# Patient Record
Sex: Male | Born: 1935 | Race: White | Hispanic: No | Smoking: Former smoker
Health system: Southern US, Community
[De-identification: ages and names within clinical notes are randomized; demographics above are authoritative.]

## PROBLEM LIST (undated history)

## (undated) DIAGNOSIS — R413 Other amnesia: Secondary | ICD-10-CM

## (undated) DIAGNOSIS — I219 Acute myocardial infarction, unspecified: Secondary | ICD-10-CM

## (undated) DIAGNOSIS — E785 Hyperlipidemia, unspecified: Secondary | ICD-10-CM

## (undated) DIAGNOSIS — I6350 Cerebral infarction due to unspecified occlusion or stenosis of unspecified cerebral artery: Secondary | ICD-10-CM

## (undated) DIAGNOSIS — I480 Paroxysmal atrial fibrillation: Secondary | ICD-10-CM

## (undated) DIAGNOSIS — M1990 Unspecified osteoarthritis, unspecified site: Secondary | ICD-10-CM

## (undated) DIAGNOSIS — N400 Enlarged prostate without lower urinary tract symptoms: Secondary | ICD-10-CM

## (undated) DIAGNOSIS — I2510 Atherosclerotic heart disease of native coronary artery without angina pectoris: Secondary | ICD-10-CM

## (undated) DIAGNOSIS — R32 Unspecified urinary incontinence: Secondary | ICD-10-CM

## (undated) DIAGNOSIS — M109 Gout, unspecified: Secondary | ICD-10-CM

## (undated) DIAGNOSIS — I119 Hypertensive heart disease without heart failure: Secondary | ICD-10-CM

## (undated) DIAGNOSIS — I739 Peripheral vascular disease, unspecified: Secondary | ICD-10-CM

## (undated) DIAGNOSIS — N183 Chronic kidney disease, stage 3 unspecified: Secondary | ICD-10-CM

## (undated) DIAGNOSIS — K089 Disorder of teeth and supporting structures, unspecified: Secondary | ICD-10-CM

## (undated) DIAGNOSIS — Z8673 Personal history of transient ischemic attack (TIA), and cerebral infarction without residual deficits: Secondary | ICD-10-CM

## (undated) DIAGNOSIS — K219 Gastro-esophageal reflux disease without esophagitis: Secondary | ICD-10-CM

## (undated) DIAGNOSIS — C801 Malignant (primary) neoplasm, unspecified: Secondary | ICD-10-CM

## (undated) HISTORY — DX: Unspecified urinary incontinence: R32

## (undated) HISTORY — PX: OTHER SURGICAL HISTORY: SHX169

## (undated) HISTORY — DX: Other amnesia: R41.3

## (undated) HISTORY — PX: PROSTATE SURGERY: SHX751

---

## 1992-11-24 DIAGNOSIS — I219 Acute myocardial infarction, unspecified: Secondary | ICD-10-CM

## 1992-11-24 HISTORY — DX: Acute myocardial infarction, unspecified: I21.9

## 2001-02-04 ENCOUNTER — Ambulatory Visit (HOSPITAL_COMMUNITY): Admission: RE | Admit: 2001-02-04 | Discharge: 2001-02-04 | Payer: Self-pay | Admitting: Cardiology

## 2002-12-23 ENCOUNTER — Encounter: Payer: Self-pay | Admitting: Surgery

## 2002-12-26 ENCOUNTER — Ambulatory Visit (HOSPITAL_COMMUNITY): Admission: RE | Admit: 2002-12-26 | Discharge: 2002-12-27 | Payer: Self-pay | Admitting: Surgery

## 2002-12-26 HISTORY — PX: HERNIA REPAIR: SHX51

## 2005-07-21 ENCOUNTER — Ambulatory Visit (HOSPITAL_COMMUNITY): Admission: RE | Admit: 2005-07-21 | Discharge: 2005-07-21 | Payer: Self-pay | Admitting: Cardiology

## 2005-07-21 HISTORY — PX: CARDIAC CATHETERIZATION: SHX172

## 2011-08-08 ENCOUNTER — Inpatient Hospital Stay (HOSPITAL_COMMUNITY): Payer: Medicare Other

## 2011-08-08 ENCOUNTER — Emergency Department (HOSPITAL_COMMUNITY): Payer: Medicare Other

## 2011-08-08 ENCOUNTER — Inpatient Hospital Stay (HOSPITAL_COMMUNITY)
Admission: EM | Admit: 2011-08-08 | Discharge: 2011-08-11 | DRG: 309 | Disposition: A | Discharge status: Home / Self Care | Payer: Medicare Other | Attending: Cardiology | Admitting: Cardiology

## 2011-08-08 DIAGNOSIS — Z7901 Long term (current) use of anticoagulants: Secondary | ICD-10-CM

## 2011-08-08 DIAGNOSIS — N189 Chronic kidney disease, unspecified: Secondary | ICD-10-CM | POA: Diagnosis present

## 2011-08-08 DIAGNOSIS — E785 Hyperlipidemia, unspecified: Secondary | ICD-10-CM | POA: Diagnosis present

## 2011-08-08 DIAGNOSIS — D696 Thrombocytopenia, unspecified: Secondary | ICD-10-CM | POA: Diagnosis present

## 2011-08-08 DIAGNOSIS — I200 Unstable angina: Secondary | ICD-10-CM | POA: Diagnosis present

## 2011-08-08 DIAGNOSIS — I4891 Unspecified atrial fibrillation: Principal | ICD-10-CM | POA: Diagnosis present

## 2011-08-08 DIAGNOSIS — I739 Peripheral vascular disease, unspecified: Secondary | ICD-10-CM | POA: Diagnosis present

## 2011-08-08 DIAGNOSIS — N179 Acute kidney failure, unspecified: Secondary | ICD-10-CM | POA: Diagnosis present

## 2011-08-08 DIAGNOSIS — I129 Hypertensive chronic kidney disease with stage 1 through stage 4 chronic kidney disease, or unspecified chronic kidney disease: Secondary | ICD-10-CM | POA: Diagnosis present

## 2011-08-08 DIAGNOSIS — Z8673 Personal history of transient ischemic attack (TIA), and cerebral infarction without residual deficits: Secondary | ICD-10-CM

## 2011-08-08 DIAGNOSIS — I2510 Atherosclerotic heart disease of native coronary artery without angina pectoris: Secondary | ICD-10-CM | POA: Diagnosis present

## 2011-08-08 LAB — POCT I-STAT TROPONIN I: Troponin i, poc: 0 ng/mL (ref 0.00–0.08)

## 2011-08-08 LAB — CBC
HCT: 40.4 % (ref 39.0–52.0)
Hemoglobin: 14.1 g/dL (ref 13.0–17.0)
RBC: 4.27 MIL/uL (ref 4.22–5.81)
WBC: 8.3 10*3/uL (ref 4.0–10.5)

## 2011-08-08 LAB — COMPREHENSIVE METABOLIC PANEL
ALT: 7 U/L (ref 0–53)
Albumin: 3.4 g/dL — ABNORMAL LOW (ref 3.5–5.2)
Alkaline Phosphatase: 63 U/L (ref 39–117)
Calcium: 10 mg/dL (ref 8.4–10.5)
Potassium: 4.4 mEq/L (ref 3.5–5.1)
Sodium: 145 mEq/L (ref 135–145)
Total Protein: 6.8 g/dL (ref 6.0–8.3)

## 2011-08-08 LAB — DIFFERENTIAL
Basophils Absolute: 0 10*3/uL (ref 0.0–0.1)
Lymphocytes Relative: 16 % (ref 12–46)
Monocytes Absolute: 0.6 10*3/uL (ref 0.1–1.0)
Neutro Abs: 6.1 10*3/uL (ref 1.7–7.7)
Neutrophils Relative %: 73 % (ref 43–77)

## 2011-08-08 LAB — CARDIAC PANEL(CRET KIN+CKTOT+MB+TROPI)
Relative Index: INVALID (ref 0.0–2.5)
Total CK: 76 U/L (ref 7–232)
Troponin I: <0.3 ng/mL (ref <0.30)

## 2011-08-08 LAB — HEPARIN LEVEL (UNFRACTIONATED): Heparin Unfractionated: 0.21 IU/mL — ABNORMAL LOW (ref 0.30–0.70)

## 2011-08-08 LAB — PROTIME-INR
INR: 1.05 (ref 0.00–1.49)
INR: 1.08 (ref 0.00–1.49)
Prothrombin Time: 13.9 seconds (ref 11.6–15.2)

## 2011-08-08 LAB — CK TOTAL AND CKMB (NOT AT ARMC)
CK, MB: 3.8 ng/mL (ref 0.3–4.0)
Relative Index: 3.6 — ABNORMAL HIGH (ref 0.0–2.5)

## 2011-08-08 LAB — APTT: aPTT: 33 seconds (ref 24–37)

## 2011-08-08 LAB — TROPONIN I: Troponin I: <0.3 ng/mL (ref <0.30)

## 2011-08-09 LAB — URINALYSIS, ROUTINE W REFLEX MICROSCOPIC
Bilirubin Urine: NEGATIVE
Glucose, UA: NEGATIVE mg/dL
Specific Gravity, Urine: 1.012 (ref 1.005–1.030)
Urobilinogen, UA: 0.2 mg/dL (ref 0.0–1.0)
pH: 6 (ref 5.0–8.0)

## 2011-08-09 LAB — CARDIAC PANEL(CRET KIN+CKTOT+MB+TROPI)
CK, MB: 3.1 ng/mL (ref 0.3–4.0)
Total CK: 74 U/L (ref 7–232)

## 2011-08-09 LAB — PROTIME-INR
INR: 1.11 (ref 0.00–1.49)
Prothrombin Time: 14.5 seconds (ref 11.6–15.2)

## 2011-08-09 LAB — CBC
HCT: 36 % — ABNORMAL LOW (ref 39.0–52.0)
MCH: 33 pg (ref 26.0–34.0)
MCHC: 35.3 g/dL (ref 30.0–36.0)
RDW: 13.1 % (ref 11.5–15.5)

## 2011-08-09 LAB — BASIC METABOLIC PANEL
BUN: 49 mg/dL — ABNORMAL HIGH (ref 6–23)
CO2: 19 mEq/L (ref 19–32)
GFR calc non Af Amer: 21 mL/min — ABNORMAL LOW (ref >60)
Glucose, Bld: 94 mg/dL (ref 70–99)
Potassium: 4 mEq/L (ref 3.5–5.1)
Sodium: 137 mEq/L (ref 135–145)

## 2011-08-09 LAB — URINE MICROSCOPIC-ADD ON

## 2011-08-09 LAB — HEPARIN LEVEL (UNFRACTIONATED): Heparin Unfractionated: 0.39 IU/mL (ref 0.30–0.70)

## 2011-08-10 LAB — BASIC METABOLIC PANEL
BUN: 42 mg/dL — ABNORMAL HIGH (ref 6–23)
Chloride: 106 mEq/L (ref 96–112)
GFR calc Af Amer: 29 mL/min — ABNORMAL LOW (ref >60)
GFR calc non Af Amer: 24 mL/min — ABNORMAL LOW (ref >60)
Potassium: 4.1 mEq/L (ref 3.5–5.1)
Sodium: 138 mEq/L (ref 135–145)

## 2011-08-10 LAB — HEPARIN LEVEL (UNFRACTIONATED): Heparin Unfractionated: 0.49 IU/mL (ref 0.30–0.70)

## 2011-08-10 LAB — CBC
HCT: 36.7 % — ABNORMAL LOW (ref 39.0–52.0)
MCHC: 35.4 g/dL (ref 30.0–36.0)
Platelets: 101 10*3/uL — ABNORMAL LOW (ref 150–400)
RDW: 13.1 % (ref 11.5–15.5)
WBC: 6.3 10*3/uL (ref 4.0–10.5)

## 2011-08-10 LAB — PROTIME-INR: INR: 1.72 — ABNORMAL HIGH (ref 0.00–1.49)

## 2011-08-10 LAB — TSH: TSH: 3.192 u[IU]/mL (ref 0.350–4.500)

## 2011-08-11 LAB — BASIC METABOLIC PANEL
CO2: 23 mEq/L (ref 19–32)
Chloride: 107 mEq/L (ref 96–112)
GFR calc Af Amer: 33 mL/min — ABNORMAL LOW (ref >60)
Potassium: 4.2 mEq/L (ref 3.5–5.1)
Sodium: 138 mEq/L (ref 135–145)

## 2011-08-11 LAB — PROTIME-INR
INR: 2.47 — ABNORMAL HIGH (ref 0.00–1.49)
Prothrombin Time: 27.2 seconds — ABNORMAL HIGH (ref 11.6–15.2)

## 2011-08-11 LAB — CBC
Hemoglobin: 12.3 g/dL — ABNORMAL LOW (ref 13.0–17.0)
MCH: 32 pg (ref 26.0–34.0)
MCHC: 34.2 g/dL (ref 30.0–36.0)
RDW: 13.1 % (ref 11.5–15.5)

## 2011-08-11 LAB — HEPARIN LEVEL (UNFRACTIONATED): Heparin Unfractionated: 0.53 IU/mL (ref 0.30–0.70)

## 2011-08-27 NOTE — Discharge Summary (Signed)
Stanley Robles, Stanley Robles                ACCOUNT NO.:  [id number]  MEDICAL RECORD NO.:  [id number]  LOCATION:  3731                         FACILITY:  [facility]  PHYSICIAN:  Georga Hacking, M.D.DATE OF BIRTH:  24-Dec-1931  DATE OF ADMISSION:  08/08/2011 DATE OF DISCHARGE:  08/11/2011                              DISCHARGE SUMMARY   FINAL DIAGNOSES: 1. Atrial fibrillation with rapid response, resolved. 2. Chest pain consistent with unstable angina pectoris due to atrial     fibrillation. 3. Acute on chronic kidney disease. 4. Previous history of stroke without residual. 5. Hypertension. 6. Hyperlipidemia under treatment. 7. Peripheral vascular disease.  PROCEDURES:  Echocardiogram.  HISTORY:  A 77 year old male who has a prior history of coronary artery disease with an occluded right coronary artery and occluded posterolateral branch of the circumflex.  He has stable angina beginning along well but did have a previous history of stroke several years ago. He was in his usual state of health and had treatment for wasp sting several weeks ago with unknown medicine but possibly involving prednisone.  He pushed the lawn mower up the hill the other day and was feeling fine yesterday and noticed some mild dyspnea with exertion.  He awoke the morning of admission with over 1 hour of substernal chest pressure and did not feel well.  He already had an appointment scheduled in the office and when he arrived was not having chest discomfort but did not feel well and was found to be in new onset of atrial fibrillation.  Please see the previously dictated history and physical for remainder of the details.  HOSPITAL COURSE:  Laboratory data shows normal CBC on admission with a platelet count of 111,000 prior to heparin, BUN is 49, creatinine is 3.15 which represented a deterioration since his previous one.  Liver enzymes were normal.  His CPK-MB was normal.  Troponins were all negative.   Urinalysis was normal.  TSH is 3.192.  The patient was admitted to the hospital and his Plavix was stopped.  Because of the acute renal failure, diclofenac, gemfibrozil were discontinued.  His aspirin was reduced.  He was started on heparin.  An echocardiogram showed a normal ejection fraction.  Renal ultrasound showed 10.1 cm right kidney and 11.6 cm left kidney with a 7 x 7 cm upper pole cyst. He had elevated prostate and it was felt he had medical renal disease. Portable chest x-ray showed no edema or pneumonia.  The patient was placed on a higher dose of atenolol but this had to be reduced again because of bradycardia.  He remained in sinus rhythm over the week and had no recurrence of chest pain or shortness of breath.  His INR rose and was 2.47 on the day of discharge.  His creatinine had fallen to 2.31 with some mild hydration over the week.  He is discharged at this time in improved condition on aspirin 81 mg daily, warfarin 4 mg daily with supper, nitroglycerin p.r.n., Flomax 0.4 mg daily, amlodipine 5 mg daily, atenolol 25 mg daily, Crestor 20 mg every other day, ranitidine 150 mg daily.  At this point in time, he is to discontinue diclofenac, gemfibrozil, and  Plavix.  He is to follow up with me in 1 week with an EKG and is to follow up with Dr. Sherril Croon for pro time on Friday.  He was given discharge instructions by the pharmacy for Coumadin.     Georga Hacking, M.D.     WST/MEDQ  D:  08/11/2011  T:  08/11/2011  Job:  [id number]  cc:   Doreen Beam, MD  Electronically Signed by Lacretia Nicks. Donnie Aho M.D. on 08/27/2011 12:33:44 PM

## 2013-01-10 ENCOUNTER — Encounter: Payer: Self-pay | Admitting: Cardiology

## 2013-01-13 ENCOUNTER — Encounter: Payer: Self-pay | Admitting: Cardiology

## 2013-03-10 ENCOUNTER — Other Ambulatory Visit: Payer: Self-pay | Admitting: Orthopedic Surgery

## 2013-03-10 MED ORDER — DEXAMETHASONE SODIUM PHOSPHATE 10 MG/ML IJ SOLN: 10.0000 mg | Freq: Once | INTRAMUSCULAR | Status: DC

## 2013-03-10 MED ORDER — BUPIVACAINE LIPOSOME 1.3 % IJ SUSP
20.0000 mL | Freq: Once | INTRAMUSCULAR | Status: DC
Start: 2013-03-10 — End: 2013-03-10

## 2013-03-10 NOTE — Progress Notes (Signed)
Preoperative surgical orders have been place into the Epic hospital system for Stanley Robles, [facility] on 03/10/2013, 12:58 PM  by Patrica Duel for surgery on 03/28/2013.  Preop Total Knee orders including Experal, IV Tylenol, and IV Decadron as long as there are no contraindications to the above medications. Avel Peace, PA-C

## 2013-03-14 ENCOUNTER — Encounter (HOSPITAL_COMMUNITY): Payer: Self-pay | Admitting: Pharmacy Technician

## 2013-03-21 ENCOUNTER — Ambulatory Visit (HOSPITAL_COMMUNITY)
Admission: RE | Admit: 2013-03-21 | Discharge: 2013-03-21 | Disposition: A | Discharge status: Home / Self Care | Payer: Medicare Other | Source: Ambulatory Visit | Attending: Orthopedic Surgery | Admitting: Orthopedic Surgery

## 2013-03-21 ENCOUNTER — Encounter (HOSPITAL_COMMUNITY): Payer: Self-pay

## 2013-03-21 ENCOUNTER — Encounter (HOSPITAL_COMMUNITY)
Admission: RE | Admit: 2013-03-21 | Discharge: 2013-03-21 | Disposition: A | Discharge status: Home / Self Care | Payer: Medicare Other | Source: Ambulatory Visit | Attending: Orthopedic Surgery | Admitting: Orthopedic Surgery

## 2013-03-21 DIAGNOSIS — I700 Atherosclerosis of aorta: Secondary | ICD-10-CM | POA: Insufficient documentation

## 2013-03-21 DIAGNOSIS — Z01818 Encounter for other preprocedural examination: Secondary | ICD-10-CM | POA: Insufficient documentation

## 2013-03-21 DIAGNOSIS — I2510 Atherosclerotic heart disease of native coronary artery without angina pectoris: Secondary | ICD-10-CM | POA: Insufficient documentation

## 2013-03-21 DIAGNOSIS — Z01812 Encounter for preprocedural laboratory examination: Secondary | ICD-10-CM | POA: Insufficient documentation

## 2013-03-21 DIAGNOSIS — Z0183 Encounter for blood typing: Secondary | ICD-10-CM | POA: Insufficient documentation

## 2013-03-21 DIAGNOSIS — M1710 Unilateral primary osteoarthritis, unspecified knee: Secondary | ICD-10-CM | POA: Insufficient documentation

## 2013-03-21 DIAGNOSIS — I739 Peripheral vascular disease, unspecified: Secondary | ICD-10-CM | POA: Insufficient documentation

## 2013-03-21 HISTORY — DX: Disorder of teeth and supporting structures, unspecified: K08.9

## 2013-03-21 HISTORY — DX: Acute myocardial infarction, unspecified: I21.9

## 2013-03-21 HISTORY — DX: Peripheral vascular disease, unspecified: I73.9

## 2013-03-21 HISTORY — DX: Gastro-esophageal reflux disease without esophagitis: K21.9

## 2013-03-21 HISTORY — DX: Malignant (primary) neoplasm, unspecified: C80.1

## 2013-03-21 HISTORY — DX: Hyperlipidemia, unspecified: E78.5

## 2013-03-21 HISTORY — DX: Benign prostatic hyperplasia without lower urinary tract symptoms: N40.0

## 2013-03-21 HISTORY — DX: Gout, unspecified: M10.9

## 2013-03-21 HISTORY — DX: Atherosclerotic heart disease of native coronary artery without angina pectoris: I25.10

## 2013-03-21 LAB — URINALYSIS, ROUTINE W REFLEX MICROSCOPIC
Bilirubin Urine: NEGATIVE
Glucose, UA: NEGATIVE mg/dL
Ketones, ur: NEGATIVE mg/dL
Protein, ur: NEGATIVE mg/dL
pH: 6 (ref 5.0–8.0)

## 2013-03-21 LAB — CBC
HCT: 45.7 % (ref 39.0–52.0)
MCH: 32.5 pg (ref 26.0–34.0)
MCHC: 34.6 g/dL (ref 30.0–36.0)
MCV: 94 fL (ref 78.0–100.0)
Platelets: 111 10*3/uL — ABNORMAL LOW (ref 150–400)
RDW: 13.8 % (ref 11.5–15.5)
WBC: 4.7 10*3/uL (ref 4.0–10.5)

## 2013-03-21 LAB — ABO/RH: ABO/RH(D): O POS

## 2013-03-21 LAB — COMPREHENSIVE METABOLIC PANEL
Albumin: 3.5 g/dL (ref 3.5–5.2)
BUN: 17 mg/dL (ref 6–23)
Calcium: 9.4 mg/dL (ref 8.4–10.5)
Chloride: 105 mEq/L (ref 96–112)
Creatinine, Ser: 1.66 mg/dL — ABNORMAL HIGH (ref 0.50–1.35)
Total Bilirubin: 0.5 mg/dL (ref 0.3–1.2)

## 2013-03-21 LAB — SURGICAL PCR SCREEN
MRSA, PCR: NEGATIVE
Staphylococcus aureus: NEGATIVE

## 2013-03-21 LAB — PROTIME-INR
INR: 1.22 (ref 0.00–1.49)
Prothrombin Time: 15.2 seconds (ref 11.6–15.2)

## 2013-03-21 NOTE — Pre-Procedure Instructions (Signed)
OFFICE NOTE, EKG 01/10/13, STRESS TEST REPORT 01/13/13 WITH CLEARANCE FOR TOTAL KNEE SURGERY - ON PT'S CHART FROM DR. TILLEY. CXR WAS DONE TODAY PREOP AT [facility]. PT'S CBC REPORT -PLATELETS 111,000 AND CMET REPORT -CREAT 1. 66 FAXED TO DR. ALUISIO'S OFFICE FOR REVIEW.

## 2013-03-21 NOTE — Patient Instructions (Signed)
YOUR SURGERY IS SCHEDULED AT [facility]  ON:  Monday  5/5  REPORT TO [facility] SHORT STAY CENTER AT:  6:25 AM      PHONE # FOR SHORT STAY IS [phone]  DO NOT EAT OR DRINK ANYTHING AFTER MIDNIGHT THE NIGHT BEFORE YOUR SURGERY.  YOU MAY BRUSH YOUR TEETH, RINSE OUT YOUR MOUTH--BUT NO WATER, NO FOOD, NO CHEWING GUM, NO MINTS, NO CANDIES, NO CHEWING TOBACCO.  PLEASE TAKE THE FOLLOWING MEDICATIONS THE AM OF YOUR SURGERY WITH A FEW SIPS OF WATER:   AMLODIPINE AND ATENOLOL  DO NOT BRING VALUABLES, MONEY, CREDIT CARDS.  DO NOT WEAR JEWELRY, MAKE-UP, NAIL POLISH AND NO METAL PINS OR CLIPS IN YOUR HAIR. CONTACT LENS, DENTURES / PARTIALS, GLASSES SHOULD NOT BE WORN TO SURGERY AND IN MOST CASES-HEARING AIDS WILL NEED TO BE REMOVED.  BRING YOUR GLASSES CASE, ANY EQUIPMENT NEEDED FOR YOUR CONTACT LENS. FOR PATIENTS ADMITTED TO THE HOSPITAL--CHECK OUT TIME THE DAY OF DISCHARGE IS 11:00 AM.  ALL INPATIENT ROOMS ARE PRIVATE - WITH BATHROOM, TELEPHONE, TELEVISION AND WIFI INTERNET.                                PLEASE READ OVER ANY  FACT SHEETS THAT YOU WERE GIVEN: MRSA INFORMATION, BLOOD TRANSFUSION INFORMATION, INCENTIVE SPIROMETER INFORMATION. FAILURE TO FOLLOW THESE INSTRUCTIONS MAY RESULT IN THE CANCELLATION OF YOUR SURGERY.   PATIENT SIGNATURE_________________________________

## 2013-03-22 NOTE — Pre-Procedure Instructions (Signed)
FAXED NOTE RECEIVED FROM DR. Lequita Halt THAT NO ACTION NEEDED REGARDING ABNORMAL LABS.

## 2013-03-25 NOTE — Pre-Procedure Instructions (Signed)
PT'S OFFICE NOTES FROM DR. PATEL - [facility] - VISIT 03/23/13 ON PT'S CHART.

## 2013-03-27 ENCOUNTER — Other Ambulatory Visit: Payer: Self-pay | Admitting: Orthopedic Surgery

## 2013-03-27 NOTE — H&P (Signed)
Elizabeth Sauer  DOB: September 28, 1932 Widowed / Language: English / Race: White Male  Date of Admission:  03/28/2013  Chief Complaint:  Left and Right Knee Pain  History of Present Illness The patient is a 79 year old male who comes in for a preoperative History and Physical. The patient is scheduled for a left total knee arthroplasty to be performed by Dr. Gus Rankin. Aluisio, MD at [facility] on 03/28/2013. The patient is a 79 year old male who presents today for follow up of their knee. The patient is being followed for their bilateral knee pain and osteoarthritis. They are now out from cortisone injections (and Left knee aspiration). Symptoms reported today include: pain. The patient feels that they are doing poorly (Patient states that the injections help for about 3 weeks). The following medication has been used for pain control: Tylenol. The patient has reported improvement of their symptoms with: Cortisone injections and viscosupplementation (they really only help for about a month. Mr. Delellis is having progressively worsening discomfort in that left knee. Right knee bothers him also but the left knee has had all the swelling. He has done well with visco supplements in the past. He has had one series that did not help but a series last summer lasted nearly six months. The cortisone did not do as well for him. He is ready to proceed with surgery. They have been treated conservatively in the past for the above stated problem and despite conservative measures, they continue to have progressive pain and severe functional limitations and dysfunction. They have failed non-operative management including home exercise, medications, and injections. It is felt that they would benefit from undergoing total joint replacement. Risks and benefits of the procedure have been discussed with the patient and they elect to proceed with surgery. There are no active contraindications to surgery such as  ongoing infection or rapidly progressive neurological disease.   Problem List Primary osteoarthritis of both knees (715.16) Seizure (780.39). Last Episode approx. May 1995   Allergies No Known Drug Allergies. 01/23/2012   Family History Congestive Heart Failure. mother Diabetes Mellitus. father   Social History Illicit drug use. no Exercise. Exercises daily; does running / walking Children. 3 Alcohol use. current drinker; drinks hard liquor; only occasionally per week Pain Contract. no Drug/Alcohol Rehab (Previously). no Tobacco use. former smoker; smoke(d) 1 1/2 pack(s) per day; uses 1 can(s) smokeless per week Living situation. live alone Drug/Alcohol Rehab (Currently). no Current work status. retired Number of flights of stairs before winded. 2-3 Marital status. widowed Tobacco / smoke exposure. no Advance Directives. Living Will   Medication History Tamsulosin HCl (0.4MG  Capsule ER, Oral) Active. Warfarin Sodium (4MG  Tablet, Oral) Active. Atenolol (25MG  Tablet, Oral) Active. Aspirin EC (81MG  Tablet DR, Oral) Active. Crestor (20MG  Tablet, Oral) Active. AmLODIPine Besylate (5MG  Tablet, Oral) Active.   Past Surgical History Tonsillectomy Cardiac Catheterization. times 2 Inguinal Hernia Repair. open: bilateral Prostatectomy; Transurethral   Medical History Gout Coronary Artery Disease/Heart Disease Mastoiditis. History Hypercholesterolemia Myocardial infarction Cerebrovascular Accident. about 20 years ago, left sided weaknes but resolved Urinary Incontinence Measles   Review of Systems General:Not Present- Chills, Fever, Night Sweats, Fatigue, Weight Gain, Weight Loss and Memory Loss. Skin:Not Present- Hives, Itching, Rash, Eczema and Lesions. HEENT:Not Present- Tinnitus, Headache, Double Vision, Visual Loss, Hearing Loss and Dentures. Respiratory:Present- Shortness of breath with exertion. Not Present- Shortness of  breath at rest, Allergies, Coughing up blood and Chronic Cough. Cardiovascular:Not Present- Chest Pain, Racing/skipping heartbeats, Difficulty Breathing Lying  Down, Murmur, Swelling and Palpitations. Gastrointestinal:Not Present- Bloody Stool, Heartburn, Abdominal Pain, Vomiting, Nausea, Constipation, Diarrhea, Difficulty Swallowing, Jaundice and Loss of appetitie. Male Genitourinary:Present- Urinating at Night. Not Present- Urinary frequency, Blood in Urine, Weak urinary stream, Discharge, Flank Pain, Incontinence, Painful Urination, Urgency and Urinary Retention. Musculoskeletal:Present- Joint Pain. Not Present- Muscle Weakness, Muscle Pain, Joint Swelling, Back Pain, Morning Stiffness and Spasms. Neurological:Not Present- Tremor, Dizziness, Blackout spells, Paralysis, Difficulty with balance and Weakness. Psychiatric:Not Present- Insomnia.   Vitals Weight: 180 lb Height: 68 in Weight was reported by patient. Height was reported by patient. Body Surface Area: 1.98 m Body Mass Index: 27.37 kg/m Pulse: 52 (Regular) Resp.: 14 (Unlabored) BP: 138/58 (Sitting, Right Arm, Standard)    Physical Exam The physical exam findings are as follows:  Note: Patient is a 46 uear old male with continued knee pain. Patient is accompanied today by his daughter.   General Mental Status - Alert, cooperative and good historian. General Appearance- pleasant. Not in acute distress. Orientation- Oriented X3. Build & Nutrition- Well nourished and Well developed.   Head and Neck Head- normocephalic, atraumatic . Neck Global Assessment- supple. no bruit auscultated on the right and no bruit auscultated on the left.   Eye Vision- Wears corrective lenses. Pupil- Bilateral- Regular and Round. Motion- Bilateral- EOMI.   Chest and Lung Exam Auscultation: Breath sounds:- clear at anterior chest wall and - clear at posterior chest wall. Adventitious sounds:- No  Adventitious sounds.   Cardiovascular Auscultation:Rhythm- Regular rate and rhythm. Heart Sounds- S1 WNL and S2 WNL. Murmurs & Other Heart Sounds:Auscultation of the heart reveals - No Murmurs.   Abdomen Palpation/Percussion:Tenderness- Abdomen is non-tender to palpation. Rigidity (guarding)- Abdomen is soft. Auscultation:Auscultation of the abdomen reveals - Bowel sounds normal.   Male Genitourinary  Not done, not pertinent to present illness  Musculoskeletal He is alert and oriented in no apparent distress. His left knee shows no effusion. There is varus deformity. His range is about 5 to 120. There is no instability noted.  RADIOGRAPHS: Radiographs showing the endstage arthritis of the left knee, bone on bone, medial and patellofemoral.  Assessment & Plan Primary osteoarthritis of both knees (715.16) Impression: Left Knee  Note: Plan is for a Left Total Knee Replacement and a Right Knee Cortisone Injection by Dr. Lequita Halt.  Plan is to go home.  PLEASE NOTE - PATIENT IS VERY HARD OF HEARING. HE IS DEAF IN THE LEFT EAR AND REDUCED HEARING IN THE RIGHT.  Signed electronically by Roberts Gaudy, PA-C

## 2013-03-28 ENCOUNTER — Inpatient Hospital Stay (HOSPITAL_COMMUNITY)
Admission: RE | Admit: 2013-03-28 | Discharge: 2013-04-04 | DRG: 469 | Disposition: A | Discharge status: Skilled Nursing Facility | Payer: Medicare Other | Source: Ambulatory Visit | Attending: Orthopedic Surgery | Admitting: Orthopedic Surgery

## 2013-03-28 ENCOUNTER — Encounter (HOSPITAL_COMMUNITY): Payer: Self-pay | Admitting: *Deleted

## 2013-03-28 ENCOUNTER — Inpatient Hospital Stay (HOSPITAL_COMMUNITY): Payer: Medicare Other | Admitting: Anesthesiology

## 2013-03-28 ENCOUNTER — Encounter (HOSPITAL_COMMUNITY)
Admission: RE | Disposition: A | Discharge status: Skilled Nursing Facility | Payer: Self-pay | Source: Ambulatory Visit | Attending: Orthopedic Surgery

## 2013-03-28 ENCOUNTER — Encounter (HOSPITAL_COMMUNITY): Payer: Self-pay | Admitting: Anesthesiology

## 2013-03-28 DIAGNOSIS — M179 Osteoarthritis of knee, unspecified: Secondary | ICD-10-CM | POA: Diagnosis present

## 2013-03-28 DIAGNOSIS — I252 Old myocardial infarction: Secondary | ICD-10-CM

## 2013-03-28 DIAGNOSIS — I509 Heart failure, unspecified: Secondary | ICD-10-CM

## 2013-03-28 DIAGNOSIS — Z7901 Long term (current) use of anticoagulants: Secondary | ICD-10-CM

## 2013-03-28 DIAGNOSIS — I480 Paroxysmal atrial fibrillation: Secondary | ICD-10-CM

## 2013-03-28 DIAGNOSIS — K219 Gastro-esophageal reflux disease without esophagitis: Secondary | ICD-10-CM | POA: Diagnosis present

## 2013-03-28 DIAGNOSIS — I2489 Other forms of acute ischemic heart disease: Secondary | ICD-10-CM | POA: Diagnosis present

## 2013-03-28 DIAGNOSIS — E785 Hyperlipidemia, unspecified: Secondary | ICD-10-CM | POA: Diagnosis present

## 2013-03-28 DIAGNOSIS — M109 Gout, unspecified: Secondary | ICD-10-CM | POA: Diagnosis present

## 2013-03-28 DIAGNOSIS — K5900 Constipation, unspecified: Secondary | ICD-10-CM | POA: Diagnosis present

## 2013-03-28 DIAGNOSIS — N183 Chronic kidney disease, stage 3 unspecified: Secondary | ICD-10-CM | POA: Diagnosis present

## 2013-03-28 DIAGNOSIS — N400 Enlarged prostate without lower urinary tract symptoms: Secondary | ICD-10-CM

## 2013-03-28 DIAGNOSIS — J810 Acute pulmonary edema: Secondary | ICD-10-CM

## 2013-03-28 DIAGNOSIS — I214 Non-ST elevation (NSTEMI) myocardial infarction: Secondary | ICD-10-CM

## 2013-03-28 DIAGNOSIS — I4891 Unspecified atrial fibrillation: Secondary | ICD-10-CM | POA: Diagnosis not present

## 2013-03-28 DIAGNOSIS — M1710 Unilateral primary osteoarthritis, unspecified knee: Principal | ICD-10-CM | POA: Diagnosis present

## 2013-03-28 DIAGNOSIS — E7800 Pure hypercholesterolemia, unspecified: Secondary | ICD-10-CM | POA: Diagnosis present

## 2013-03-28 DIAGNOSIS — B952 Enterococcus as the cause of diseases classified elsewhere: Secondary | ICD-10-CM

## 2013-03-28 DIAGNOSIS — Z8673 Personal history of transient ischemic attack (TIA), and cerebral infarction without residual deficits: Secondary | ICD-10-CM

## 2013-03-28 DIAGNOSIS — I248 Other forms of acute ischemic heart disease: Secondary | ICD-10-CM | POA: Diagnosis present

## 2013-03-28 DIAGNOSIS — E876 Hypokalemia: Secondary | ICD-10-CM

## 2013-03-28 DIAGNOSIS — D62 Acute posthemorrhagic anemia: Secondary | ICD-10-CM | POA: Diagnosis not present

## 2013-03-28 DIAGNOSIS — I1310 Hypertensive heart and chronic kidney disease without heart failure, with stage 1 through stage 4 chronic kidney disease, or unspecified chronic kidney disease: Secondary | ICD-10-CM | POA: Diagnosis present

## 2013-03-28 DIAGNOSIS — R339 Retention of urine, unspecified: Secondary | ICD-10-CM

## 2013-03-28 DIAGNOSIS — I2510 Atherosclerotic heart disease of native coronary artery without angina pectoris: Secondary | ICD-10-CM

## 2013-03-28 DIAGNOSIS — Z96652 Presence of left artificial knee joint: Secondary | ICD-10-CM

## 2013-03-28 DIAGNOSIS — N320 Bladder-neck obstruction: Secondary | ICD-10-CM | POA: Diagnosis present

## 2013-03-28 DIAGNOSIS — R9431 Abnormal electrocardiogram [ECG] [EKG]: Secondary | ICD-10-CM | POA: Diagnosis present

## 2013-03-28 DIAGNOSIS — E871 Hypo-osmolality and hyponatremia: Secondary | ICD-10-CM | POA: Diagnosis not present

## 2013-03-28 DIAGNOSIS — I739 Peripheral vascular disease, unspecified: Secondary | ICD-10-CM | POA: Diagnosis present

## 2013-03-28 DIAGNOSIS — I5031 Acute diastolic (congestive) heart failure: Secondary | ICD-10-CM | POA: Diagnosis not present

## 2013-03-28 DIAGNOSIS — Z79899 Other long term (current) drug therapy: Secondary | ICD-10-CM

## 2013-03-28 DIAGNOSIS — N390 Urinary tract infection, site not specified: Secondary | ICD-10-CM | POA: Diagnosis not present

## 2013-03-28 HISTORY — PX: TOTAL KNEE ARTHROPLASTY: SHX125

## 2013-03-28 HISTORY — DX: Chronic kidney disease, stage 3 (moderate): N18.3

## 2013-03-28 HISTORY — DX: Personal history of transient ischemic attack (TIA), and cerebral infarction without residual deficits: Z86.73

## 2013-03-28 HISTORY — DX: Hypertensive heart disease without heart failure: I11.9

## 2013-03-28 HISTORY — DX: Chronic kidney disease, stage 3 unspecified: N18.30

## 2013-03-28 HISTORY — DX: Paroxysmal atrial fibrillation: I48.0

## 2013-03-28 HISTORY — DX: Unspecified osteoarthritis, unspecified site: M19.90

## 2013-03-28 LAB — TYPE AND SCREEN: ABO/RH(D): O POS

## 2013-03-28 SURGERY — ARTHROPLASTY, KNEE, TOTAL
Anesthesia: Spinal | Site: Knee | Laterality: Left | Wound class: Clean

## 2013-03-28 MED ORDER — MENTHOL 3 MG MT LOZG: 1.0000 | LOZENGE | OROMUCOSAL | Status: DC | PRN

## 2013-03-28 MED ORDER — TAMSULOSIN HCL 0.4 MG PO CAPS
0.4000 mg | ORAL_CAPSULE | Freq: Every day | ORAL | Status: DC
  Administered 2013-03-28 – 2013-04-01 (×5): 0.4 mg via ORAL
  Filled 2013-03-28 (×6): qty 1

## 2013-03-28 MED ORDER — ONDANSETRON HCL 4 MG/2ML IJ SOLN
4.0000 mg | Freq: Four times a day (QID) | INTRAMUSCULAR | Status: DC | PRN
  Administered 2013-03-28 – 2013-03-30 (×3): 4 mg via INTRAVENOUS
  Filled 2013-03-28 (×3): qty 2

## 2013-03-28 MED ORDER — MORPHINE SULFATE 2 MG/ML IJ SOLN
1.0000 mg | INTRAMUSCULAR | Status: DC | PRN
  Administered 2013-03-28: 0.5 mg via INTRAVENOUS
  Administered 2013-03-28: 1 mg via INTRAVENOUS
  Administered 2013-03-28: 0.5 mg via INTRAVENOUS
  Administered 2013-03-29 – 2013-03-31 (×2): 2 mg via INTRAVENOUS
  Filled 2013-03-28 (×4): qty 1

## 2013-03-28 MED ORDER — ATENOLOL 25 MG PO TABS
25.0000 mg | ORAL_TABLET | Freq: Every day | ORAL | Status: DC
  Administered 2013-03-29 – 2013-03-30 (×2): 25 mg via ORAL
  Filled 2013-03-28 (×3): qty 1

## 2013-03-28 MED ORDER — METHOCARBAMOL 100 MG/ML IJ SOLN: 500.0000 mg | Freq: Four times a day (QID) | INTRAVENOUS | Status: DC | PRN

## 2013-03-28 MED ORDER — DEXAMETHASONE 6 MG PO TABS
10.0000 mg | ORAL_TABLET | Freq: Every day | ORAL | Status: AC
  Administered 2013-03-29: 10 mg via ORAL
  Filled 2013-03-28: qty 1

## 2013-03-28 MED ORDER — CEFAZOLIN SODIUM 1-5 GM-% IV SOLN
1.0000 g | Freq: Four times a day (QID) | INTRAVENOUS | Status: AC
  Administered 2013-03-28 (×2): 1 g via INTRAVENOUS
  Filled 2013-03-28 (×2): qty 50

## 2013-03-28 MED ORDER — FLEET ENEMA 7-19 GM/118ML RE ENEM: 1.0000 | ENEMA | Freq: Once | RECTAL | Status: AC | PRN

## 2013-03-28 MED ORDER — METHOCARBAMOL 500 MG PO TABS
500.0000 mg | ORAL_TABLET | Freq: Four times a day (QID) | ORAL | Status: DC | PRN
  Administered 2013-03-28 – 2013-04-02 (×9): 500 mg via ORAL
  Filled 2013-03-28 (×7): qty 1

## 2013-03-28 MED ORDER — SODIUM CHLORIDE 0.9 % IJ SOLN
INTRAMUSCULAR | Status: DC | PRN
  Administered 2013-03-28: 10:00:00

## 2013-03-28 MED ORDER — ACETAMINOPHEN 10 MG/ML IV SOLN
1000.0000 mg | Freq: Once | INTRAVENOUS | Status: AC
  Administered 2013-03-28: 1000 mg via INTRAVENOUS

## 2013-03-28 MED ORDER — BISACODYL 10 MG RE SUPP
10.0000 mg | Freq: Every day | RECTAL | Status: DC | PRN
  Administered 2013-03-30: 10 mg via RECTAL
  Filled 2013-03-28: qty 1

## 2013-03-28 MED ORDER — BUPIVACAINE LIPOSOME 1.3 % IJ SUSP
20.0000 mL | Freq: Once | INTRAMUSCULAR | Status: DC
  Filled 2013-03-28: qty 20

## 2013-03-28 MED ORDER — SODIUM CHLORIDE 0.9 % IR SOLN
Status: DC | PRN
  Administered 2013-03-28: 1000 mL

## 2013-03-28 MED ORDER — OXYCODONE HCL 5 MG PO TABS
5.0000 mg | ORAL_TABLET | ORAL | Status: DC | PRN
  Administered 2013-03-28: 5 mg via ORAL
  Administered 2013-03-28 (×2): 10 mg via ORAL
  Administered 2013-03-28: 5 mg via ORAL
  Administered 2013-03-29 – 2013-03-30 (×7): 10 mg via ORAL
  Filled 2013-03-28 (×3): qty 2
  Filled 2013-03-28: qty 1
  Filled 2013-03-28 (×3): qty 2
  Filled 2013-03-28: qty 1
  Filled 2013-03-28 (×2): qty 2

## 2013-03-28 MED ORDER — CHLORHEXIDINE GLUCONATE 4 % EX LIQD: 60.0000 mL | Freq: Once | CUTANEOUS | Status: DC

## 2013-03-28 MED ORDER — WARFARIN - PHARMACIST DOSING INPATIENT: Freq: Every day | Status: DC

## 2013-03-28 MED ORDER — FENTANYL CITRATE 0.05 MG/ML IJ SOLN
INTRAMUSCULAR | Status: DC | PRN
  Administered 2013-03-28: 50 ug via INTRAVENOUS

## 2013-03-28 MED ORDER — SODIUM CHLORIDE 0.9 % IV SOLN: INTRAVENOUS | Status: DC

## 2013-03-28 MED ORDER — FENTANYL CITRATE 0.05 MG/ML IJ SOLN: 25.0000 ug | INTRAMUSCULAR | Status: DC | PRN

## 2013-03-28 MED ORDER — AMLODIPINE BESYLATE 5 MG PO TABS
5.0000 mg | ORAL_TABLET | Freq: Every day | ORAL | Status: DC
  Administered 2013-03-30: 5 mg via ORAL
  Filled 2013-03-28 (×3): qty 1

## 2013-03-28 MED ORDER — 0.9 % SODIUM CHLORIDE (POUR BTL) OPTIME
TOPICAL | Status: DC | PRN
  Administered 2013-03-28: 1000 mL

## 2013-03-28 MED ORDER — PROPOFOL 10 MG/ML IV BOLUS
INTRAVENOUS | Status: DC | PRN
  Administered 2013-03-28: 30 mg via INTRAVENOUS

## 2013-03-28 MED ORDER — PROMETHAZINE HCL 25 MG/ML IJ SOLN: 6.2500 mg | INTRAMUSCULAR | Status: DC | PRN

## 2013-03-28 MED ORDER — POLYETHYLENE GLYCOL 3350 17 G PO PACK
17.0000 g | PACK | Freq: Every day | ORAL | Status: DC | PRN
  Administered 2013-03-30 – 2013-03-31 (×2): 17 g via ORAL
  Filled 2013-03-28: qty 1

## 2013-03-28 MED ORDER — WARFARIN SODIUM 3 MG PO TABS
3.0000 mg | ORAL_TABLET | Freq: Once | ORAL | Status: AC
  Administered 2013-03-28: 3 mg via ORAL
  Filled 2013-03-28: qty 1

## 2013-03-28 MED ORDER — DEXAMETHASONE SODIUM PHOSPHATE 10 MG/ML IJ SOLN
10.0000 mg | Freq: Every day | INTRAMUSCULAR | Status: AC
  Filled 2013-03-28: qty 1

## 2013-03-28 MED ORDER — PHENOL 1.4 % MT LIQD
1.0000 | OROMUCOSAL | Status: DC | PRN
Start: 2013-03-28 — End: 2013-04-04

## 2013-03-28 MED ORDER — ONDANSETRON HCL 4 MG PO TABS: 4.0000 mg | ORAL_TABLET | Freq: Four times a day (QID) | ORAL | Status: DC | PRN

## 2013-03-28 MED ORDER — BUPIVACAINE HCL 0.25 % IJ SOLN
INTRAMUSCULAR | Status: DC | PRN
  Administered 2013-03-28: 20 mL

## 2013-03-28 MED ORDER — MEPERIDINE HCL 50 MG/ML IJ SOLN: 6.2500 mg | INTRAMUSCULAR | Status: DC | PRN

## 2013-03-28 MED ORDER — ATORVASTATIN CALCIUM 40 MG PO TABS
40.0000 mg | ORAL_TABLET | Freq: Every day | ORAL | Status: DC
  Administered 2013-03-28 – 2013-04-03 (×2): 40 mg via ORAL
  Filled 2013-03-28 (×8): qty 1

## 2013-03-28 MED ORDER — METOCLOPRAMIDE HCL 5 MG/ML IJ SOLN
5.0000 mg | Freq: Three times a day (TID) | INTRAMUSCULAR | Status: DC | PRN
  Administered 2013-03-29 – 2013-03-30 (×2): 10 mg via INTRAVENOUS
  Filled 2013-03-28 (×3): qty 2

## 2013-03-28 MED ORDER — CEFAZOLIN SODIUM-DEXTROSE 2-3 GM-% IV SOLR
2.0000 g | INTRAVENOUS | Status: AC
  Administered 2013-03-28: 2 g via INTRAVENOUS

## 2013-03-28 MED ORDER — TRAMADOL HCL 50 MG PO TABS
50.0000 mg | ORAL_TABLET | Freq: Four times a day (QID) | ORAL | Status: DC | PRN
  Administered 2013-03-31 – 2013-04-02 (×3): 50 mg via ORAL
  Filled 2013-03-28 (×3): qty 1
  Filled 2013-03-28: qty 2

## 2013-03-28 MED ORDER — DOCUSATE SODIUM 100 MG PO CAPS
100.0000 mg | ORAL_CAPSULE | Freq: Two times a day (BID) | ORAL | Status: DC
  Administered 2013-03-28 – 2013-04-04 (×13): 100 mg via ORAL
  Filled 2013-03-28 (×8): qty 1

## 2013-03-28 MED ORDER — PHENYLEPHRINE HCL 10 MG/ML IJ SOLN
10.0000 mg | INTRAVENOUS | Status: DC | PRN
  Administered 2013-03-28: 10 ug/min via INTRAVENOUS

## 2013-03-28 MED ORDER — ENOXAPARIN SODIUM 30 MG/0.3ML ~~LOC~~ SOLN
30.0000 mg | Freq: Two times a day (BID) | SUBCUTANEOUS | Status: DC
  Administered 2013-03-29 – 2013-03-30 (×4): 30 mg via SUBCUTANEOUS
  Filled 2013-03-28 (×7): qty 0.3

## 2013-03-28 MED ORDER — DEXTROSE-NACL 5-0.9 % IV SOLN
INTRAVENOUS | Status: DC
  Administered 2013-03-28 – 2013-03-30 (×4): via INTRAVENOUS

## 2013-03-28 MED ORDER — PROPOFOL 10 MG/ML IV EMUL
INTRAVENOUS | Status: DC | PRN
  Administered 2013-03-28: 75 ug/kg/min via INTRAVENOUS

## 2013-03-28 MED ORDER — ACETAMINOPHEN 650 MG RE SUPP: 650.0000 mg | Freq: Four times a day (QID) | RECTAL | Status: DC | PRN

## 2013-03-28 MED ORDER — NITROGLYCERIN 0.4 MG SL SUBL: 0.4000 mg | SUBLINGUAL_TABLET | SUBLINGUAL | Status: DC | PRN

## 2013-03-28 MED ORDER — LACTATED RINGERS IV SOLN
INTRAVENOUS | Status: DC
  Administered 2013-03-28 (×2): via INTRAVENOUS

## 2013-03-28 MED ORDER — METOCLOPRAMIDE HCL 10 MG PO TABS: 5.0000 mg | ORAL_TABLET | Freq: Three times a day (TID) | ORAL | Status: DC | PRN

## 2013-03-28 MED ORDER — DIPHENHYDRAMINE HCL 12.5 MG/5ML PO ELIX: 12.5000 mg | ORAL_SOLUTION | ORAL | Status: DC | PRN

## 2013-03-28 MED ORDER — ACETAMINOPHEN 10 MG/ML IV SOLN
1000.0000 mg | Freq: Four times a day (QID) | INTRAVENOUS | Status: AC
  Administered 2013-03-28 – 2013-03-29 (×4): 1000 mg via INTRAVENOUS
  Filled 2013-03-28 (×5): qty 100

## 2013-03-28 MED ORDER — ACETAMINOPHEN 325 MG PO TABS: 650.0000 mg | ORAL_TABLET | Freq: Four times a day (QID) | ORAL | Status: DC | PRN

## 2013-03-28 MED ORDER — MIDAZOLAM HCL 5 MG/5ML IJ SOLN
INTRAMUSCULAR | Status: DC | PRN
  Administered 2013-03-28: 1 mg via INTRAVENOUS

## 2013-03-28 SURGICAL SUPPLY — 54 items
BAG SPEC THK2 15X12 ZIP CLS (MISCELLANEOUS) ×1
BAG ZIPLOCK 12X15 (MISCELLANEOUS) ×2 IMPLANT
BANDAGE ELASTIC 6 VELCRO ST LF (GAUZE/BANDAGES/DRESSINGS) ×2 IMPLANT
BANDAGE ESMARK 6X9 LF (GAUZE/BANDAGES/DRESSINGS) ×1 IMPLANT
BLADE SAG 18X100X1.27 (BLADE) ×2 IMPLANT
BLADE SAW SGTL 11.0X1.19X90.0M (BLADE) ×2 IMPLANT
BNDG CMPR 9X6 STRL LF SNTH (GAUZE/BANDAGES/DRESSINGS) ×1
BNDG ESMARK 6X9 LF (GAUZE/BANDAGES/DRESSINGS) ×2
BOWL SMART MIX CTS (DISPOSABLE) ×2 IMPLANT
CEMENT HV SMART SET (Cement) ×4 IMPLANT
CLOTH BEACON ORANGE TIMEOUT ST (SAFETY) ×2 IMPLANT
CUFF TOURN SGL QUICK 34 (TOURNIQUET CUFF) ×2
CUFF TRNQT CYL 34X4X40X1 (TOURNIQUET CUFF) ×1 IMPLANT
DRAPE EXTREMITY T 121X128X90 (DRAPE) ×2 IMPLANT
DRAPE POUCH INSTRU U-SHP 10X18 (DRAPES) ×2 IMPLANT
DRAPE U-SHAPE 47X51 STRL (DRAPES) ×2 IMPLANT
DRSG ADAPTIC 3X8 NADH LF (GAUZE/BANDAGES/DRESSINGS) ×2 IMPLANT
DRSG PAD ABDOMINAL 8X10 ST (GAUZE/BANDAGES/DRESSINGS) ×1 IMPLANT
DURAPREP 26ML APPLICATOR (WOUND CARE) ×2 IMPLANT
ELECT REM PT RETURN 9FT ADLT (ELECTROSURGICAL) ×2
ELECTRODE REM PT RTRN 9FT ADLT (ELECTROSURGICAL) ×1 IMPLANT
EVACUATOR 1/8 PVC DRAIN (DRAIN) ×2 IMPLANT
FACESHIELD LNG OPTICON STERILE (SAFETY) ×10 IMPLANT
GLOVE BIO SURGEON STRL SZ8 (GLOVE) ×2 IMPLANT
GLOVE BIOGEL PI IND STRL 8 (GLOVE) ×2 IMPLANT
GLOVE BIOGEL PI INDICATOR 8 (GLOVE) ×1
GLOVE SURG SS PI 6.5 STRL IVOR (GLOVE) ×4 IMPLANT
GOWN STRL NON-REIN LRG LVL3 (GOWN DISPOSABLE) ×4 IMPLANT
GOWN STRL REIN XL XLG (GOWN DISPOSABLE) ×3 IMPLANT
HANDPIECE INTERPULSE COAX TIP (DISPOSABLE) ×2
IMMOBILIZER KNEE 20 (SOFTGOODS) ×2
IMMOBILIZER KNEE 20 THIGH 36 (SOFTGOODS) ×1 IMPLANT
KIT BASIN OR (CUSTOM PROCEDURE TRAY) ×2 IMPLANT
MANIFOLD NEPTUNE II (INSTRUMENTS) ×2 IMPLANT
NDL SAFETY ECLIPSE 18X1.5 (NEEDLE) ×1 IMPLANT
NEEDLE HYPO 18GX1.5 SHARP (NEEDLE) ×2
NS IRRIG 1000ML POUR BTL (IV SOLUTION) ×2 IMPLANT
PACK TOTAL JOINT (CUSTOM PROCEDURE TRAY) ×2 IMPLANT
PADDING CAST COTTON 6X4 STRL (CAST SUPPLIES) ×6 IMPLANT
POSITIONER SURGICAL ARM (MISCELLANEOUS) ×2 IMPLANT
SET HNDPC FAN SPRY TIP SCT (DISPOSABLE) ×1 IMPLANT
SPONGE GAUZE 4X4 12PLY (GAUZE/BANDAGES/DRESSINGS) ×2 IMPLANT
STRIP CLOSURE SKIN 1/2X4 (GAUZE/BANDAGES/DRESSINGS) ×4 IMPLANT
SUCTION FRAZIER 12FR DISP (SUCTIONS) ×2 IMPLANT
SUT MNCRL AB 4-0 PS2 18 (SUTURE) ×2 IMPLANT
SUT VIC AB 2-0 CT1 27 (SUTURE) ×6
SUT VIC AB 2-0 CT1 TAPERPNT 27 (SUTURE) ×3 IMPLANT
SUT VLOC 180 0 24IN GS25 (SUTURE) ×2 IMPLANT
SYR 20CC LL (SYRINGE) ×1 IMPLANT
SYR 50ML LL SCALE MARK (SYRINGE) ×2 IMPLANT
TOWEL OR 17X26 10 PK STRL BLUE (TOWEL DISPOSABLE) ×4 IMPLANT
TRAY FOLEY CATH 14FRSI W/METER (CATHETERS) ×2 IMPLANT
WATER STERILE IRR 1500ML POUR (IV SOLUTION) ×3 IMPLANT
WRAP KNEE MAXI GEL POST OP (GAUZE/BANDAGES/DRESSINGS) ×3 IMPLANT

## 2013-03-28 NOTE — Anesthesia Postprocedure Evaluation (Signed)
  Anesthesia Post-op Note  Patient: Stanley Robles  Procedure(s) Performed: Procedure(s) (LRB): LEFT TOTAL KNEE ARTHROPLASTY (Left)  Patient Location: PACU  Anesthesia Type: Spinal  Level of Consciousness: awake and alert   Airway and Oxygen Therapy: Patient Spontanous Breathing  Post-op Pain: mild  Post-op Assessment: Post-op Vital signs reviewed, Patient's Cardiovascular Status Stable, Respiratory Function Stable, Patent Airway and No signs of Nausea or vomiting  Last Vitals:  Filed Vitals:   03/28/13 1400  BP: 118/63  Pulse: 50  Temp: 36.5 C  Resp: 15    Post-op Vital Signs: stable   Complications: No apparent anesthesia complications

## 2013-03-28 NOTE — Interval H&P Note (Signed)
History and Physical Interval Note:  03/28/2013 7:06 AM  Stanley Robles  has presented today for surgery, with the diagnosis of oa left knee   The various methods of treatment have been discussed with the patient and family. After consideration of risks, benefits and other options for treatment, the patient has consented to  Procedure(s): LEFT TOTAL KNEE ARTHROPLASTY (Left) as a surgical intervention .  The patient's history has been reviewed, patient examined, no change in status, stable for surgery.  I have reviewed the patient's chart and labs.  Questions were answered to the patient's satisfaction.     Loanne Drilling

## 2013-03-28 NOTE — Op Note (Signed)
Pre-operative diagnosis- Osteoarthritis  Left knee(s)  Post-operative diagnosis- Osteoarthritis Left knee(s)  Procedure-  Left  Total Knee Arthroplasty  Surgeon- Gus Rankin. Eeva Schlosser, MD  Assistant- Dimitri Ped, PA-C   Anesthesia-  Spinal EBL-* No blood loss amount entered *  Drains Hemovac  Tourniquet time-  Total Tourniquet Time Documented: Thigh (Left) - 40 minutes Total: Thigh (Left) - 40 minutes    Complications- None  Condition-PACU - hemodynamically stable.   Brief Clinical Note   Stanley Robles is a 79 y.o. year old male with end stage OA of his left knee with progressively worsening pain and dysfunction. He has constant pain, with activity and at rest and significant functional deficits with difficulties even with ADLs. He has had extensive non-op management including analgesics, injections of cortisone and viscosupplements, and home exercise program, but remains in significant pain with significant dysfunction. Radiographs show bone on bone arthritis medial and patellofemoral. He presents now for left Total Knee Arthroplasty.     Procedure in detail---   The patient is brought into the operating room and positioned supine on the operating table. After successful administration of  Spinal,   a tourniquet is placed high on the Left thigh(s) and the lower extremity is prepped and draped in the usual sterile fashion. Time out is performed by the operating team and then the  Left lower extremity is wrapped in Esmarch, knee flexed and the tourniquet inflated to 300 mmHg.       A midline incision is made with a ten blade through the subcutaneous tissue to the level of the extensor mechanism. A fresh blade is used to make a medial parapatellar arthrotomy. Soft tissue over the proximal medial tibia is subperiosteally elevated to the joint line with a knife and into the semimembranosus bursa with a Cobb elevator. Soft tissue over the proximal lateral tibia is elevated with attention being  paid to avoiding the patellar tendon on the tibial tubercle. The patella is everted, knee flexed 90 degrees and the ACL and PCL are removed. Findings are bone on bone medial and patellofemoral with large medial osteophytes.        The drill is used to create a starting hole in the distal femur and the canal is thoroughly irrigated with sterile saline to remove the fatty contents. The 5 degree Left  valgus alignment guide is placed into the femoral canal and the distal femoral cutting block is pinned to remove 10 mm off the distal femur. Resection is made with an oscillating saw.      The tibia is subluxed forward and the menisci are removed. The extramedullary alignment guide is placed referencing proximally at the medial aspect of the tibial tubercle and distally along the second metatarsal axis and tibial crest. The block is pinned to remove 2mm off the more deficient medial  side. Resection is made with an oscillating saw. Size 4is the most appropriate size for the tibia and the proximal tibia is prepared with the modular drill and keel punch for that size.      The femoral sizing guide is placed and size 5 is most appropriate. Rotation is marked off the epicondylar axis and confirmed by creating a rectangular flexion gap at 90 degrees. The size 5 cutting block is pinned in this rotation and the anterior, posterior and chamfer cuts are made with the oscillating saw. The intercondylar block is then placed and that cut is made.      Trial size 4 tibial component, trial size 5  posterior stabilized femur and a 10  mm posterior stabilized rotating platform insert trial is placed. Full extension is achieved with excellent varus/valgus and anterior/posterior balance throughout full range of motion. The patella is everted and thickness measured to be 27  mm. Free hand resection is taken to 15 mm, a 41 template is placed, lug holes are drilled, trial patella is placed, and it tracks normally. Osteophytes are removed  off the posterior femur with the trial in place. All trials are removed and the cut bone surfaces prepared with pulsatile lavage. Cement is mixed and once ready for implantation, the size 4 tibial implant, size  5 posterior stabilized femoral component, and the size 41 patella are cemented in place and the patella is held with the clamp. The trial insert is placed and the knee held in full extension. The Exparel (20 ml mixed with 30 ml saline) and 20 ml of .25% Bupivicaine is injected into the extensor mechanism, posterior capsule, medial and lateral gutters and subcutaneous tissues.  All extruded cement is removed and once the cement is hard the permanent 10 mm posterior stabilized rotating platform insert is placed into the tibial tray.      The wound is copiously irrigated with saline solution and the extensor mechanism closed over a hemovac drain with #1 PDS suture. The tourniquet is released for a total tourniquet time of 40  minutes. Flexion against gravity is 140 degrees and the patella tracks normally. Subcutaneous tissue is closed with 2.0 vicryl and subcuticular with running 4.0 Monocryl. The incision is cleaned and dried and steri-strips and a bulky sterile dressing are applied. The limb is placed into a knee immobilizer and the patient is awakened and transported to recovery in stable condition.      Please note that a surgical assistant was a medical necessity for this procedure in order to perform it in a safe and expeditious manner. Surgical assistant was necessary to retract the ligaments and vital neurovascular structures to prevent injury to them and also necessary for proper positioning of the limb to allow for anatomic placement of the prosthesis.   Gus Rankin Gabriele Zwilling, MD    03/28/2013, 10:35 AM

## 2013-03-28 NOTE — Anesthesia Procedure Notes (Signed)
Spinal  End time: 03/28/2013 9:24 AM Staffing CRNA/Resident: Xochitl Egle E Spinal Block Patient position: sitting Prep: Betadine Patient monitoring: continuous pulse ox, blood pressure and heart rate Approach: midline Location: L3-4 Injection technique: single-shot Needle Needle type: Spinocan  Needle gauge: 22 G Assessment Sensory level: T6 Additional Notes Kit checked and expiration checked with in date. No paresthesia and heme.  CSFx3 good flow. Pt tolerated procedure  Well.

## 2013-03-28 NOTE — Transfer of Care (Signed)
Immediate Anesthesia Transfer of Care Note  Patient: Stanley Robles  Procedure(s) Performed: Procedure(s): LEFT TOTAL KNEE ARTHROPLASTY (Left)  Patient Location: PACU  Anesthesia Type:Regional  Level of Consciousness: awake, alert  and oriented  Airway & Oxygen Therapy: Patient Spontanous Breathing and Patient connected to face mask oxygen  Post-op Assessment: Report given to PACU RN and Post -op Vital signs reviewed and stable  Post vital signs: Reviewed and stable  Complications: No apparent anesthesia complications

## 2013-03-28 NOTE — Progress Notes (Signed)
ANTICOAGULATION CONSULT NOTE - Initial Consult  Pharmacy Consult for warfarin Indication:  Hx of atrial fibrillation; also s/p L TKA 5/5  No Known Allergies  Patient Measurements:     Vital Signs: Temp: 97.7 F (36.5 C) (05/05 1400) Temp src: Oral (05/05 1400) BP: 118/63 mmHg (05/05 1400) Pulse Rate: 50 (05/05 1400)  Labs: No results found for this basename: HGB, HCT, PLT, APTT, LABPROT, INR, HEPARINUNFRC, CREATININE, CKTOTAL, CKMB, TROPONINI,  in the last 72 hours From preoperative labs: PT 15.2, INR 1.22 on 03/21/13  CrCl is unknown because there is no height on file for the current visit. SCr 1.66 on 03/21/13, CrCl ~ 42 mL/min  Medical History: Past Medical History  Diagnosis Date  . Dysrhythmia     HX OF ATRIAL FIB - CHRONIC COUMADIN  . Hyperlipidemia   . Peripheral vascular disease   . Chronic kidney disease     STAGE 3  . Coronary artery disease     DR. TILLEY IS PT'S CARDIOLGIST  . Myocardial infarction 1994  . Gout     USUALLY IN THE FEET  . BPH (benign prostatic hypertrophy)   . Stroke     NO DEFICITS  . Hypertension   . GERD (gastroesophageal reflux disease)     ROLAIDS IF NEEDED  . 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 Arthritis   . Tooth disease     PT STATES HE IS SCHEDULED TO HAVE BAD TOOTH PULLED 03/24/13 - WILL CHECK WITH DR. Lequita Halt TO MAKE SURE THIS IS OK TO DO BEFORE HIS PLANNED KNEE REPLACEMENT ON 5/5.    Medications:  Scheduled:  . [COMPLETED] acetaminophen  1,000 mg Intravenous Once  . acetaminophen  1,000 mg Intravenous Q6H  . [START ON 03/29/2013] amLODipine  5 mg Oral Daily  . [START ON 03/29/2013] atenolol  25 mg Oral Daily  . atorvastatin  40 mg Oral q1800  .  ceFAZolin (ANCEF) IV  1 g Intravenous Q6H  . [COMPLETED]  ceFAZolin (ANCEF) IV  2 g Intravenous On Call to OR  . [START ON 03/29/2013] dexamethasone  10 mg Oral Daily   Or  . [START ON 03/29/2013] dexamethasone  10 mg Intravenous Daily  . docusate sodium   100 mg Oral BID  . [START ON 03/29/2013] enoxaparin (LOVENOX) injection  30 mg Subcutaneous Q12H  . tamsulosin  0.4 mg Oral QPC supper  . [DISCONTINUED] bupivacaine liposome  20 mL Infiltration Once  . [DISCONTINUED] chlorhexidine  60 mL Topical Once   Infusions:  . dextrose 5 % and 0.9% NaCl    . [DISCONTINUED] sodium chloride    . [DISCONTINUED] lactated ringers     PRN: [START ON 03/29/2013] acetaminophen, [START ON 03/29/2013] acetaminophen, bisacodyl, diphenhydrAMINE, menthol-cetylpyridinium, methocarbamol (ROBAXIN) IV, methocarbamol, metoCLOPramide (REGLAN) injection, metoCLOPramide, morphine injection, nitroGLYCERIN, ondansetron (ZOFRAN) IV, ondansetron, oxyCODONE, phenol, polyethylene glycol, sodium phosphate, traMADol, [DISCONTINUED] 0.9 % irrigation (POUR BTL), [DISCONTINUED] bupivacaine [DISCONTINUED] bupivacaine liposome (EXPAREL 1.3 %) with 0.9 % sodium chloride inj, [DISCONTINUED] fentaNYL, [DISCONTINUED] meperidine (DEMEROL) injection, [DISCONTINUED] promethazine, [DISCONTINUED] sodium chloride irrigation  Assessment: 79 y/o M on chronic warfarin for h/o atrial fibrillation, anticoagulation interrupted for L TKA, which was done 03/28/13.   To resume warfarin on the evening of surgery and begin prophylactic-dose Lovenox on the morning of POD#1. Warfarin dosage PTA reported as 2 mg daily  Goal Range:  INR 2-3    Plan:  1. Warfarin 3 mg PO x 1 tonight at 1800.  2. Lovenox 30 mg SQ q12h starting tomorrow AM as ordered by ortho. 3. PT/INR daily while inpatient.  Elie Goody, PharmD, BCPS Pager: [phone] 03/28/2013  2:43 PM

## 2013-03-28 NOTE — Progress Notes (Signed)
Had tooth pulled on Thursday took antibiotic prior to dental procedure.

## 2013-03-28 NOTE — H&P (View-Only) (Signed)
Stanley Robles  DOB: 01/14/1932 Widowed / Language: English / Race: White Male  Date of Admission:  03/28/2013  Chief Complaint:  Left and Right Knee Pain  History of Present Illness The patient is a 79 year old male who comes in for a preoperative History and Physical. The patient is scheduled for a left total knee arthroplasty to be performed by Dr. Frank V. Aluisio, MD at [facility] on 03/28/2013. The patient is a 79 year old male who presents today for follow up of their knee. The patient is being followed for their bilateral knee pain and osteoarthritis. They are now out from cortisone injections (and Left knee aspiration). Symptoms reported today include: pain. The patient feels that they are doing poorly (Patient states that the injections help for about 3 weeks). The following medication has been used for pain control: Tylenol. The patient has reported improvement of their symptoms with: Cortisone injections and viscosupplementation (they really only help for about a month. Mr. Westrich is having progressively worsening discomfort in that left knee. Right knee bothers him also but the left knee has had all the swelling. He has done well with visco supplements in the past. He has had one series that did not help but a series last summer lasted nearly six months. The cortisone did not do as well for him. He is ready to proceed with surgery. They have been treated conservatively in the past for the above stated problem and despite conservative measures, they continue to have progressive pain and severe functional limitations and dysfunction. They have failed non-operative management including home exercise, medications, and injections. It is felt that they would benefit from undergoing total joint replacement. Risks and benefits of the procedure have been discussed with the patient and they elect to proceed with surgery. There are no active contraindications to surgery such as  ongoing infection or rapidly progressive neurological disease.   Problem List Primary osteoarthritis of both knees (715.16) Seizure (780.39). Last Episode approx. May 1995   Allergies No Known Drug Allergies. 01/23/2012   Family History Congestive Heart Failure. mother Diabetes Mellitus. father   Social History Illicit drug use. no Exercise. Exercises daily; does running / walking Children. 3 Alcohol use. current drinker; drinks hard liquor; only occasionally per week Pain Contract. no Drug/Alcohol Rehab (Previously). no Tobacco use. former smoker; smoke(d) 1 1/2 pack(s) per day; uses 1 can(s) smokeless per week Living situation. live alone Drug/Alcohol Rehab (Currently). no Current work status. retired Number of flights of stairs before winded. 2-3 Marital status. widowed Tobacco / smoke exposure. no Advance Directives. Living Will   Medication History Tamsulosin HCl (0.4MG Capsule ER, Oral) Active. Warfarin Sodium (4MG Tablet, Oral) Active. Atenolol (25MG Tablet, Oral) Active. Aspirin EC (81MG Tablet DR, Oral) Active. Crestor (20MG Tablet, Oral) Active. AmLODIPine Besylate (5MG Tablet, Oral) Active.   Past Surgical History Tonsillectomy Cardiac Catheterization. times 2 Inguinal Hernia Repair. open: bilateral Prostatectomy; Transurethral   Medical History Gout Coronary Artery Disease/Heart Disease Mastoiditis. History Hypercholesterolemia Myocardial infarction Cerebrovascular Accident. about 20 years ago, left sided weaknes but resolved Urinary Incontinence Measles   Review of Systems General:Not Present- Chills, Fever, Night Sweats, Fatigue, Weight Gain, Weight Loss and Memory Loss. Skin:Not Present- Hives, Itching, Rash, Eczema and Lesions. HEENT:Not Present- Tinnitus, Headache, Double Vision, Visual Loss, Hearing Loss and Dentures. Respiratory:Present- Shortness of breath with exertion. Not Present- Shortness of  breath at rest, Allergies, Coughing up blood and Chronic Cough. Cardiovascular:Not Present- Chest Pain, Racing/skipping heartbeats, Difficulty Breathing Lying   Down, Murmur, Swelling and Palpitations. Gastrointestinal:Not Present- Bloody Stool, Heartburn, Abdominal Pain, Vomiting, Nausea, Constipation, Diarrhea, Difficulty Swallowing, Jaundice and Loss of appetitie. Male Genitourinary:Present- Urinating at Night. Not Present- Urinary frequency, Blood in Urine, Weak urinary stream, Discharge, Flank Pain, Incontinence, Painful Urination, Urgency and Urinary Retention. Musculoskeletal:Present- Joint Pain. Not Present- Muscle Weakness, Muscle Pain, Joint Swelling, Back Pain, Morning Stiffness and Spasms. Neurological:Not Present- Tremor, Dizziness, Blackout spells, Paralysis, Difficulty with balance and Weakness. Psychiatric:Not Present- Insomnia.   Vitals Weight: 180 lb Height: 68 in Weight was reported by patient. Height was reported by patient. Body Surface Area: 1.98 m Body Mass Index: 27.37 kg/m Pulse: 52 (Regular) Resp.: 14 (Unlabored) BP: 138/58 (Sitting, Right Arm, Standard)    Physical Exam The physical exam findings are as follows:  Note: Patient is a 79 uear old male with continued knee pain. Patient is accompanied today by his daughter.   General Mental Status - Alert, cooperative and good historian. General Appearance- pleasant. Not in acute distress. Orientation- Oriented X3. Build & Nutrition- Well nourished and Well developed.   Head and Neck Head- normocephalic, atraumatic . Neck Global Assessment- supple. no bruit auscultated on the right and no bruit auscultated on the left.   Eye Vision- Wears corrective lenses. Pupil- Bilateral- Regular and Round. Motion- Bilateral- EOMI.   Chest and Lung Exam Auscultation: Breath sounds:- clear at anterior chest wall and - clear at posterior chest wall. Adventitious sounds:- No  Adventitious sounds.   Cardiovascular Auscultation:Rhythm- Regular rate and rhythm. Heart Sounds- S1 WNL and S2 WNL. Murmurs & Other Heart Sounds:Auscultation of the heart reveals - No Murmurs.   Abdomen Palpation/Percussion:Tenderness- Abdomen is non-tender to palpation. Rigidity (guarding)- Abdomen is soft. Auscultation:Auscultation of the abdomen reveals - Bowel sounds normal.   Male Genitourinary  Not done, not pertinent to present illness  Musculoskeletal He is alert and oriented in no apparent distress. His left knee shows no effusion. There is varus deformity. His range is about 5 to 120. There is no instability noted.  RADIOGRAPHS: Radiographs showing the endstage arthritis of the left knee, bone on bone, medial and patellofemoral.  Assessment & Plan Primary osteoarthritis of both knees (715.16) Impression: Left Knee  Note: Plan is for a Left Total Knee Replacement and a Right Knee Cortisone Injection by Dr. Aluisio.  Plan is to go home.  PLEASE NOTE - PATIENT IS VERY HARD OF HEARING. HE IS DEAF IN THE LEFT EAR AND REDUCED HEARING IN THE RIGHT.  Signed electronically by DREW L [name], PA-C 

## 2013-03-28 NOTE — Plan of Care (Signed)
Problem: Consults Goal: Diagnosis- Total Joint Replacement Primary Total Knee     

## 2013-03-28 NOTE — Progress Notes (Signed)
PT Cancellation Note  Patient Details Name: Stanley Robles MRN: [id number] DOB: 02-Oct-1932   Cancelled Treatment:     Attempted PT eval POD 0-pt not ready per RN. Thanks.    Rebeca Alert, MPT Pager: [phone]

## 2013-03-28 NOTE — Anesthesia Preprocedure Evaluation (Addendum)
Anesthesia Evaluation  Patient identified by MRN, date of birth, ID band Patient awake    Reviewed: Allergy & Precautions, H&P , NPO status , Patient's Chart, lab work & pertinent test results  Airway Mallampati: II TM Distance: >3 FB Neck ROM: Full    Dental no notable dental hx. (+) Missing   Pulmonary neg pulmonary ROS,  breath sounds clear to auscultation  Pulmonary exam normal       Cardiovascular hypertension, Pt. on medications + CAD and + Past MI + dysrhythmias Atrial Fibrillation Rhythm:Regular Rate:Normal     Neuro/Psych CVA, No Residual Symptoms negative psych ROS   GI/Hepatic Neg liver ROS, GERD-  Medicated and Controlled,  Endo/Other  negative endocrine ROS  Renal/GU negative Renal ROS  negative genitourinary   Musculoskeletal negative musculoskeletal ROS (+)   Abdominal   Peds negative pediatric ROS (+)  Hematology negative hematology ROS (+)   Anesthesia Other Findings   Reproductive/Obstetrics negative OB ROS                          Anesthesia Physical Anesthesia Plan  ASA: II  Anesthesia Plan: Spinal   Post-op Pain Management:    Induction:   Airway Management Planned: Simple Face Mask  Additional Equipment:   Intra-op Plan:   Post-operative Plan:   Informed Consent: I have reviewed the patients History and Physical, chart, labs and discussed the procedure including the risks, benefits and alternatives for the proposed anesthesia with the patient or authorized representative who has indicated his/her understanding and acceptance.   Dental advisory given  Plan Discussed with: CRNA  Anesthesia Plan Comments:         Anesthesia Quick Evaluation

## 2013-03-29 ENCOUNTER — Encounter (HOSPITAL_COMMUNITY): Payer: Self-pay | Admitting: Orthopedic Surgery

## 2013-03-29 DIAGNOSIS — D62 Acute posthemorrhagic anemia: Secondary | ICD-10-CM | POA: Diagnosis not present

## 2013-03-29 DIAGNOSIS — E871 Hypo-osmolality and hyponatremia: Secondary | ICD-10-CM | POA: Diagnosis not present

## 2013-03-29 LAB — BASIC METABOLIC PANEL
BUN: 18 mg/dL (ref 6–23)
Chloride: 99 mEq/L (ref 96–112)
Creatinine, Ser: 1.51 mg/dL — ABNORMAL HIGH (ref 0.50–1.35)
Glucose, Bld: 199 mg/dL — ABNORMAL HIGH (ref 70–99)
Potassium: 4.2 mEq/L (ref 3.5–5.1)

## 2013-03-29 LAB — CBC
HCT: 35.4 % — ABNORMAL LOW (ref 39.0–52.0)
Hemoglobin: 12.1 g/dL — ABNORMAL LOW (ref 13.0–17.0)
MCV: 92.7 fL (ref 78.0–100.0)
WBC: 8.2 10*3/uL (ref 4.0–10.5)

## 2013-03-29 MED ORDER — ALUM & MAG HYDROXIDE-SIMETH 200-200-20 MG/5ML PO SUSP: 30.0000 mL | Freq: Four times a day (QID) | ORAL | Status: DC | PRN

## 2013-03-29 MED ORDER — CALCIUM CARBONATE ANTACID 500 MG PO CHEW
1.0000 | CHEWABLE_TABLET | ORAL | Status: DC | PRN
  Administered 2013-03-29 – 2013-04-01 (×2): 200 mg via ORAL
  Filled 2013-03-29 (×2): qty 1

## 2013-03-29 MED ORDER — WARFARIN SODIUM 3 MG PO TABS
3.0000 mg | ORAL_TABLET | Freq: Once | ORAL | Status: AC
  Administered 2013-03-29: 3 mg via ORAL
  Filled 2013-03-29: qty 1

## 2013-03-29 NOTE — Progress Notes (Signed)
   Subjective: 1 Day Post-Op Procedure(s) (LRB): LEFT TOTAL KNEE ARTHROPLASTY (Left) Patient reports pain as mild.   Patient seen in rounds with Dr. Lequita Halt. Patient is well, and has had no acute complaints or problems We will start therapy today.  Plan is to go Skilled nursing facility after hospital stay.  He wants to look into [facility] or [facility].  Objective: Vital signs in last 24 hours: Temp:  [92.3 F (33.5 C)-98.3 F (36.8 C)] 97.6 F (36.4 C) (05/06 0637) Pulse Rate:  [43-65] 64 (05/06 0637) Resp:  [8-16] 16 (05/06 0637) BP: (90-152)/(42-74) 112/66 mmHg (05/06 0637) SpO2:  [92 %-100 %] 96 % (05/06 0637) Weight:  [81.194 kg (179 lb)] 81.194 kg (179 lb) (05/05 1430)  Intake/Output from previous day:  Intake/Output Summary (Last 24 hours) at 03/29/13 0811 Last data filed at 03/29/13 0981  Gross per 24 hour  Intake 4303.33 ml  Output   2750 ml  Net 1553.33 ml    Intake/Output this shift: UOP 560 since MN +1553  Labs:  Recent Labs  03/29/13 0502  HGB 12.1*    Recent Labs  03/29/13 0502  WBC 8.2  RBC 3.82*  HCT 35.4*  PLT 83*    Recent Labs  03/29/13 0502  NA 132*  K 4.2  CL 99  CO2 22  BUN 18  CREATININE 1.51*  GLUCOSE 199*  CALCIUM 8.3*    Recent Labs  03/29/13 0502  INR 1.04    EXAM General - Patient is Alert, Appropriate and Oriented Extremity - Neurovascular intact Sensation intact distally Dorsiflexion/Plantar flexion intact Dressing - dressing C/D/I Motor Function - intact, moving foot and toes well on exam.  Hemovac pulled without difficulty.  Past Medical History  Diagnosis Date  . Dysrhythmia     HX OF ATRIAL FIB - CHRONIC COUMADIN  . Hyperlipidemia   . Peripheral vascular disease   . Chronic kidney disease     STAGE 3  . Coronary artery disease     DR. TILLEY IS PT'S CARDIOLGIST  . Myocardial infarction 1994  . Gout     USUALLY IN THE FEET  . BPH (benign prostatic hypertrophy)   . Stroke     NO DEFICITS   . Hypertension   . GERD (gastroesophageal reflux disease)     ROLAIDS IF NEEDED  . 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 Arthritis   . Tooth disease     PT STATES HE IS SCHEDULED TO HAVE BAD TOOTH PULLED 03/24/13 - WILL CHECK WITH DR. Lequita Halt TO MAKE SURE THIS IS OK TO DO BEFORE HIS PLANNED KNEE REPLACEMENT ON 5/5.    Assessment/Plan: 1 Day Post-Op Procedure(s) (LRB): LEFT TOTAL KNEE ARTHROPLASTY (Left) Principal Problem:   OA (osteoarthritis) of knee Active Problems:   Postoperative anemia due to acute blood loss   Postop Hyponatremia  Estimated body mass index is 27.22 kg/(m^2) as calculated from the following:   Height as of this encounter: 5\' 8"  (1.727 m).   Weight as of this encounter: 81.194 kg (179 lb). Advance diet Up with therapy Discharge to SNF  DVT Prophylaxis - Lovenox and Coumadin, ASA 81 mg on hold, INR is 1.04 today. Weight-Bearing as tolerated to left leg No vaccines. D/C O2 and Pulse OX and try on Room Air  [name], [name] 03/29/2013, 8:11 AM

## 2013-03-29 NOTE — Progress Notes (Signed)
Clinical Social Work Department BRIEF PSYCHOSOCIAL ASSESSMENT 03/29/2013  Patient:  Stanley Robles, Stanley Robles     Account Number:  [id number]     Admit date:  03/28/2013  Clinical Social Worker:  Candie Chroman  Date/Time:  03/29/2013 11:41 AM  Referred by:  Physician  Date Referred:  03/29/2013 Referred for  SNF Placement   Other Referral:   Interview type:  Patient Other interview type:    PSYCHOSOCIAL DATA Living Status:  ALONE Admitted from facility:   Level of care:   Primary support name:  Blair Hailey Primary support relationship to patient:  CHILD, ADULT Degree of support available:   supportive    CURRENT CONCERNS Current Concerns  Post-Acute Placement   Other Concerns:    SOCIAL WORK ASSESSMENT / PLAN Pt is a 79 yr old gentleman living at home prior to hospitalization. CSW met with pt to assist with d/c planning. ST Rehab will be needed following hospital d/c. Pt is hoping to go to [facility] for ST SNF placement. CSW has contacted SNF and has been told there may be an opening for this pt. Pt is also interested in [facility]. SNF contacted and CSW is waiting for a response.   Assessment/plan status:  Psychosocial Support/Ongoing Assessment of Needs Other assessment/ plan:   Information/referral to community resources:   None needed at this time.    PATIENT'S/FAMILY'S RESPONSE TO PLAN OF CARE: Pt is hoping to have rehab at [facility] when ready for d/c.   Cori Razor LCSW [phone]

## 2013-03-29 NOTE — Progress Notes (Signed)
Physical Therapy Treatment Patient Details Name: Stanley Robles MRN: [id number] DOB: 1932/08/31 Today's Date: 03/29/2013 Time: 5621-3086 PT Time Calculation (min): 36 min  PT Assessment / Plan / Recommendation Comments on Treatment Session  Pt. tolerated ambulation better with KI on. Pt. continues to be antalgic and decreased weight on L.    Follow Up Recommendations  SNF     Does the patient have the potential to tolerate intense rehabilitation     Barriers to Discharge        Equipment Recommendations  None recommended by PT    Recommendations for Other Services    Frequency 7X/week   Plan Discharge plan remains appropriate;Frequency remains appropriate    Precautions / Restrictions Precautions Precautions: Knee Required Braces or Orthoses: Knee Immobilizer - Left Knee Immobilizer - Left: Discontinue once straight leg raise with < 10 degree lag   Pertinent Vitals/Pain States L knee is painful with WB, RN notified.    Mobility  Bed Mobility Sit to Supine: 4: Min assist Details for Bed Mobility Assistance: support to get LLE onto bed. Transfers Sit to Stand: 1: +2 Total assist;With upper extremity assist;From chair/3-in-1 Sit to Stand: Patient Percentage: 70% Stand to Sit: To bed Stand to Sit: Patient Percentage: 70% Details for Transfer Assistance: cues for hand placement and for L leg position prior to sitting down. Ambulation/Gait Ambulation/Gait Assistance: 1: +2 Total assist Ambulation/Gait: Patient Percentage: 70% Ambulation Distance (Feet): 25 Feet Assistive device: Rolling walker Ambulation/Gait Assistance Details: cues for sequence, KI provided more support, Pt continues to not bear weight on LLE at times. Gait Pattern: Step-to pattern;Antalgic;Trunk flexed    Exercises Total Joint Exercises Ankle Circles/Pumps: AROM;Both;10 reps;Supine Quad Sets: AAROM;Left;10 reps;Supine Heel Slides: AAROM;Left;10 reps;Supine Hip ABduction/ADduction: AAROM;Left;10  reps;Supine Straight Leg Raises: AAROM;Left;10 reps;Supine Goniometric ROM: 10-35 L knee   PT Diagnosis:    PT Problem List:   PT Treatment Interventions:     PT Goals Acute Rehab PT Goals Pt will go Sit to Supine/Side: with supervision PT Goal: Sit to Supine/Side - Progress: Progressing toward goal Pt will go Sit to Stand: with supervision PT Goal: Sit to Stand - Progress: Progressing toward goal Pt will go Stand to Sit: with supervision PT Goal: Stand to Sit - Progress: Progressing toward goal Pt will Ambulate: 51 - 150 feet;with min assist PT Goal: Ambulate - Progress: Progressing toward goal Pt will Perform Home Exercise Program: with min assist PT Goal: Perform Home Exercise Program - Progress: Progressing toward goal  Visit Information  Last PT Received On: 03/29/13 Assistance Needed: +2    Subjective Data  Subjective: I did a little better.   Cognition  Cognition Arousal/Alertness: Awake/alert    Balance     End of Session PT - End of Session Equipment Utilized During Treatment: Left knee immobilizer Activity Tolerance: Patient tolerated treatment well Patient left: in bed;with call bell/phone within reach, RN notofied of need for pain meds. CPM Left Knee CPM Left Knee: On   GP     Rada Hay 03/29/2013, 3:37 PM

## 2013-03-29 NOTE — Progress Notes (Addendum)
ANTICOAGULATION CONSULT NOTE - Follow Up Consult  Pharmacy Consult for [facility] Indication: Hx of atrial fibrillation; also s/p L TKA 5/5  No Known Allergies  Patient Measurements: Height: 5\' 8"  (172.7 cm) Weight: 179 lb (81.194 kg) IBW/kg (Calculated) : 68.4  Vital Signs: Temp: 97.7 F (36.5 C) (05/06 0947) Temp src: Oral (05/06 0947) BP: 113/52 mmHg (05/06 0947) Pulse Rate: 60 (05/06 0947)  Labs:  Recent Labs  03/29/13 0502  HGB 12.1*  HCT 35.4*  PLT 83*  LABPROT 13.5  INR 1.04  CREATININE 1.51*    Estimated Creatinine Clearance: 39 ml/min (by C-G formula based on Cr of 1.51).   Medications:  Scheduled:  . acetaminophen  1,000 mg Intravenous Q6H  . amLODipine  5 mg Oral Daily  . atenolol  25 mg Oral Daily  . atorvastatin  40 mg Oral q1800  . [COMPLETED]  ceFAZolin (ANCEF) IV  1 g Intravenous Q6H  . [COMPLETED] dexamethasone  10 mg Oral Daily   Or  . [COMPLETED] dexamethasone  10 mg Intravenous Daily  . docusate sodium  100 mg Oral BID  . enoxaparin (LOVENOX) injection  30 mg Subcutaneous Q12H  . tamsulosin  0.4 mg Oral QPC supper  . [COMPLETED] warfarin  3 mg Oral ONCE-1800  . Warfarin - Pharmacist Dosing Inpatient   Does not apply q1800  . [DISCONTINUED] chlorhexidine  60 mL Topical Once   Infusions:  . dextrose 5 % and 0.9% NaCl 20 mL/hr at 03/29/13 0949  . [DISCONTINUED] sodium chloride    . [DISCONTINUED] lactated ringers      Assessment: 79 y/o M on chronic warfarin for h/o atrial fibrillation, anticoagulation interrupted for L TKA, which was done 03/28/13  Home warfarin dosage 2mg  daily. Also on aspirin 81mg  daily, but currently on hold per ortho.  INR unchanged as expected (1.04) after resuming warfarin last night  Lovenox 30mg  q12h started this am - NOTE LOW PLATELETS (83k, but only 111k preop)  H/H decreased postop, no bleeding/complications reported.  Goal of Therapy:  INR 2-3 Monitor platelets by anticoagulation protocol: Yes   Plan:    Repeat warfarin 3mg  today at 1800  Continue lovenox as ordered, watch closely for bleeding with low platelets  Daily PT/INR  Loralee Pacas, PharmD, BCPS Pager: [phone] 03/29/2013,10:06 AM

## 2013-03-29 NOTE — Progress Notes (Signed)
Utilization review completed.  

## 2013-03-29 NOTE — Evaluation (Signed)
Physical Therapy Evaluation Patient Details Name: Stanley Robles MRN: [id number] DOB: 1931-12-30 Today's Date: 03/29/2013 Time: 0865-7846 PT Time Calculation (min): 20 min  PT Assessment / Plan / Recommendation Clinical Impression  79 yo malle admitted 03/28/13 for LTKA. Pt. ambulated x 25 feet. Pt has difficulty with weight on L leg, Pt may benefit from KI to support leg. Pt. plans DC to snf rehab. Pt will benefit from PT while in acute care to improve ROM, strength and functional mobility.    PT Assessment  Patient needs continued PT services    Follow Up Recommendations  SNF    Does the patient have the potential to tolerate intense rehabilitation      Barriers to Discharge Decreased caregiver support      Equipment Recommendations  None recommended by PT    Recommendations for Other Services     Frequency 7X/week    Precautions / Restrictions Precautions Precautions: Knee Restrictions Weight Bearing Restrictions: No   Pertinent Vitals/Pain 'L knee hurts'. Was premedicated, ice applied.      Mobility  Bed Mobility Bed Mobility: Supine to Sit;Sit to Supine;Sitting - Scoot to Edge of Bed Supine to Sit: 3: Mod assist Sitting - Scoot to Edge of Bed: 4: Min assist Transfers Transfers: Sit to Stand;Stand to Sit Sit to Stand: 1: +2 Total assist;With upper extremity assist;From bed Sit to Stand: Patient Percentage: 60% Stand to Sit: To chair/3-in-1;With armrests;1: +2 Total assist Stand to Sit: Patient Percentage: 60% Details for Transfer Assistance: cues for hand placement and for L leg position prior to sitting down. Ambulation/Gait Ambulation/Gait Assistance: 1: +2 Total assist Ambulation/Gait: Patient Percentage: 60% Ambulation Distance (Feet): 25 Feet Assistive device: Rolling walker Ambulation/Gait Assistance Details: cues for sequence, attempt to place weight on LLE, at times pt is NWB, gait unsteady and jerky when taking a step. Gait Pattern: Step-to  pattern;Antalgic;Trunk flexed    Exercises     PT Diagnosis: Difficulty walking;Generalized weakness;Acute pain  PT Problem List: Decreased strength;Decreased range of motion;Decreased activity tolerance;Decreased balance;Decreased mobility;Decreased knowledge of precautions;Decreased safety awareness;Decreased knowledge of use of DME;Pain PT Treatment Interventions: DME instruction;Gait training;Functional mobility training;Therapeutic exercise;Therapeutic activities;Patient/family education   PT Goals Acute Rehab PT Goals PT Goal Formulation: With patient Time For Goal Achievement: 04/05/13 Potential to Achieve Goals: Good Pt will go Supine/Side to Sit: with supervision PT Goal: Supine/Side to Sit - Progress: Goal set today Pt will go Sit to Supine/Side: with supervision PT Goal: Sit to Supine/Side - Progress: Goal set today Pt will go Sit to Stand: with supervision PT Goal: Sit to Stand - Progress: Goal set today Pt will go Stand to Sit: with supervision PT Goal: Stand to Sit - Progress: Goal set today Pt will Ambulate: 51 - 150 feet;with min assist PT Goal: Ambulate - Progress: Goal set today Pt will Perform Home Exercise Program: with min assist PT Goal: Perform Home Exercise Program - Progress: Goal set today  Visit Information  Last PT Received On: 03/29/13 Assistance Needed: +2    Subjective Data  Subjective: I did my best, My heel burns. Patient Stated Goal: yo go to rehab   Prior Functioning  Home Living Lives With: Alone Type of Home: Skilled Nursing Facility Prior Function Level of Independence: Independent Communication Communication: HOH    Cognition  Cognition Arousal/Alertness: Awake/alert Behavior During Therapy: WFL for tasks assessed/performed Overall Cognitive Status: Within Functional Limits for tasks assessed    Extremity/Trunk Assessment Right Lower Extremity Assessment RLE ROM/Strength/Tone: [facility] for tasks assessed RLE Sensation:  WFL - Light  Touch Left Lower Extremity Assessment LLE ROM/Strength/Tone: Deficits LLE ROM/Strength/Tone Deficits: lacks 15 degr extension, assist to lift leg. LLE Sensation: WFL - Light Touch   Balance    End of Session PT - End of Session Activity Tolerance: Patient limited by pain;Patient limited by fatigue Patient left: in chair;with call bell/phone within reach Nurse Communication: Mobility status CPM Left Knee CPM Left Knee: Off  GP     Rada Hay 03/29/2013, 11:19 AM Blanchard Kelch PT [phone]

## 2013-03-29 NOTE — Progress Notes (Signed)
Clinical Social Work Department CLINICAL SOCIAL WORK PLACEMENT NOTE 03/29/2013  Patient:  Stanley Robles, Stanley Robles  Account Number:  [id number] Admit date:  03/28/2013  Clinical Social Worker:  Cori Razor, LCSW  Date/time:  03/29/2013 11:50 AM  Clinical Social Work is seeking post-discharge placement for this patient at the following level of care:   SKILLED NURSING   (*CSW will update this form in Epic as items are completed)     Patient/family provided with [facility] Department of Clinical Social Work's list of facilities offering this level of care within the geographic area requested by the patient (or if unable, by the patient's family).  03/29/2013  Patient/family informed of their freedom to choose among providers that offer the needed level of care, that participate in Medicare, Medicaid or managed care program needed by the patient, have an available bed and are willing to accept the patient.    Patient/family informed of [facility]' ownership interest in [facility], as well as of the fact that they are under no obligation to receive care at this facility.  PASARR submitted to EDS on 03/28/2013 PASARR number received from EDS on 03/28/2013  FL2 transmitted to all facilities in geographic area requested by pt/family on  03/29/2013 FL2 transmitted to all facilities within larger geographic area on   Patient informed that his/her managed care company has contracts with or will negotiate with  certain facilities, including the following:     Patient/family informed of bed offers received:   Patient chooses bed at  Physician recommends and patient chooses bed at    Patient to be transferred to  on   Patient to be transferred to facility by   The following physician request were entered in Epic:   Additional Comments:  Cori Razor LCSW [phone]

## 2013-03-30 ENCOUNTER — Inpatient Hospital Stay (HOSPITAL_COMMUNITY): Payer: Medicare Other

## 2013-03-30 LAB — BASIC METABOLIC PANEL
BUN: 28 mg/dL — ABNORMAL HIGH (ref 6–23)
Chloride: 98 mEq/L (ref 96–112)
Creatinine, Ser: 1.87 mg/dL — ABNORMAL HIGH (ref 0.50–1.35)
GFR calc Af Amer: 38 mL/min — ABNORMAL LOW (ref >90)

## 2013-03-30 LAB — CBC
HCT: 30.5 % — ABNORMAL LOW (ref 39.0–52.0)
MCH: 30.9 pg (ref 26.0–34.0)
MCV: 92.4 fL (ref 78.0–100.0)
RDW: 14 % (ref 11.5–15.5)
WBC: 12.8 10*3/uL — ABNORMAL HIGH (ref 4.0–10.5)

## 2013-03-30 MED ORDER — LACTATED RINGERS IV SOLN
INTRAVENOUS | Status: DC
  Administered 2013-03-30: 21:00:00 via INTRAVENOUS
  Administered 2013-03-31: 70 mL/h via INTRAVENOUS
  Administered 2013-03-31: 11:00:00 via INTRAVENOUS

## 2013-03-30 MED ORDER — METOCLOPRAMIDE HCL 5 MG/ML IJ SOLN
10.0000 mg | Freq: Four times a day (QID) | INTRAMUSCULAR | Status: DC
  Administered 2013-03-30 – 2013-04-02 (×12): 10 mg via INTRAVENOUS
  Filled 2013-03-30 (×19): qty 2

## 2013-03-30 MED ORDER — WARFARIN SODIUM 3 MG PO TABS
3.0000 mg | ORAL_TABLET | Freq: Once | ORAL | Status: AC
  Administered 2013-03-30: 3 mg via ORAL
  Filled 2013-03-30: qty 1

## 2013-03-30 NOTE — Progress Notes (Signed)
   Subjective: 2 Days Post-Op Procedure(s) (LRB): LEFT TOTAL KNEE ARTHROPLASTY (Left) Patient reports pain as mild.   Patient seen in rounds with Dr. Lequita Robles. Patient is well, and has had no acute complaints or problems.  Pain is controlled. Plan is to go Skilled nursing facility after hospital stay.  Objective: Vital signs in last 24 hours: Temp:  [98.3 F (36.8 C)-98.6 F (37 C)] 98.6 F (37 C) (05/07 0552) Pulse Rate:  [68-71] 68 (05/07 0925) Resp:  [16] 16 (05/07 0552) BP: (134-164)/(59-61) 135/61 mmHg (05/07 0925) SpO2:  [89 %-96 %] 89 % (05/07 0925)  Intake/Output from previous day:  Intake/Output Summary (Last 24 hours) at 03/30/13 1434 Last data filed at 03/30/13 1000  Gross per 24 hour  Intake   1120 ml  Output   1250 ml  Net   -130 ml    Intake/Output this shift: Total I/O In: 320 [P.O.:240; I.V.:80] Out: 275 [Urine:275]  Labs:  Recent Labs  03/29/13 0502 03/30/13 0500  HGB 12.1* 10.2*    Recent Labs  03/29/13 0502 03/30/13 0500  WBC 8.2 12.8*  RBC 3.82* 3.30*  HCT 35.4* 30.5*  PLT 83* 109*    Recent Labs  03/29/13 0502 03/30/13 0500  NA 132* 131*  K 4.2 4.5  CL 99 98  CO2 22 26  BUN 18 28*  CREATININE 1.51* 1.87*  GLUCOSE 199* 138*  CALCIUM 8.3* 8.5    Recent Labs  03/29/13 0502 03/30/13 0500  INR 1.04 1.38    EXAM General - Patient is Alert, Appropriate and Oriented Extremity - Neurovascular intact Sensation intact distally Dorsiflexion/Plantar flexion intact No cellulitis present Dressing/Incision - clean, dry, no drainage, healing Motor Function - intact, moving foot and toes well on exam.   Past Medical History  Diagnosis Date  . Dysrhythmia     HX OF ATRIAL FIB - CHRONIC COUMADIN  . Hyperlipidemia   . Peripheral vascular disease   . Chronic kidney disease     STAGE 3  . Coronary artery disease     DR. TILLEY IS PT'S CARDIOLGIST  . Myocardial infarction 1994  . Gout     USUALLY IN THE FEET  . BPH (benign  prostatic hypertrophy)   . Stroke     NO DEFICITS  . Hypertension   . GERD (gastroesophageal reflux disease)     ROLAIDS IF NEEDED  . 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 Arthritis   . Tooth disease     PT STATES HE IS SCHEDULED TO HAVE BAD TOOTH PULLED 03/24/13 - WILL CHECK WITH DR. Lequita Robles TO MAKE SURE THIS IS OK TO DO BEFORE HIS PLANNED KNEE REPLACEMENT ON 5/5.    Assessment/Plan: 2 Days Post-Op Procedure(s) (LRB): LEFT TOTAL KNEE ARTHROPLASTY (Left) Principal Problem:   OA (osteoarthritis) of knee Active Problems:   Postoperative anemia due to acute blood loss   Postop Hyponatremia  Estimated body mass index is 27.22 kg/(m^2) as calculated from the following:   Height as of this encounter: 5\' 8"  (1.727 m).   Weight as of this encounter: 81.194 kg (179 lb). Up with therapy Plan for discharge tomorrow Discharge to SNF  DVT Prophylaxis - Lovenox and Coumadin, ASA 81 mg on hold, INR is 1.38 today.  Weight-Bearing as tolerated to left leg  Stanley Robles 03/30/2013, 2:34 PM

## 2013-03-30 NOTE — Evaluation (Signed)
Occupational Therapy Evaluation Patient Details Name: Stanley Robles MRN: [id number] DOB: 02-10-1932 Today's Date: 03/30/2013 Time: 1207-1224 OT Time Calculation (min): 17 min  OT Assessment / Plan / Recommendation Clinical Impression  Pt is s/p L TKA and displays decreased strength, increased pain and overall a decrease in ADL independence. He will benefit from skilled OT services to improve independence with these tasks for next venue of care.     OT Assessment  Patient needs continued OT Services    Follow Up Recommendations  SNF;Supervision/Assistance - 24 hour    Barriers to Discharge      Equipment Recommendations  3 in 1 bedside comode    Recommendations for Other Services    Frequency  Min 2X/week    Precautions / Restrictions Precautions Precautions: Knee Required Braces or Orthoses: Knee Immobilizer - Left Knee Immobilizer - Left: Discontinue once straight leg raise with < 10 degree lag Restrictions Weight Bearing Restrictions: No        ADL  Eating/Feeding: Simulated;Independent Where Assessed - Eating/Feeding: Chair Grooming: Simulated;Wash/dry hands;Set up Where Assessed - Grooming: Supported sitting Upper Body Bathing: Simulated;Chest;Right arm;Left arm;Abdomen;Set up Where Assessed - Upper Body Bathing: Unsupported sitting Lower Body Bathing: Simulated;Moderate assistance Where Assessed - Lower Body Bathing: Supported sit to stand Upper Body Dressing: Simulated;Set up Where Assessed - Upper Body Dressing: Unsupported sitting Lower Body Dressing: Simulated;Moderate assistance Where Assessed - Lower Body Dressing: Supported sit to Pharmacist, hospital: Simulated;Moderate assistance Toilet Transfer Method: Sit to stand Toileting - Clothing Manipulation and Hygiene: Simulated;Moderate assistance Where Assessed - Toileting Clothing Manipulation and Hygiene: Sit to stand from 3-in-1 or toilet Equipment Used: Rolling walker;Long-handled shoe horn;Long-handled  sponge;Reacher;Sock aid ADL Comments: Educated pt on all AE options and coverage. Pt verbalizes understanding. States it is painful to WB through L LE and tends to note weight shift to that side.    OT Diagnosis: Generalized weakness  OT Problem List: Decreased strength;Pain;Decreased knowledge of use of DME or AE OT Treatment Interventions: Self-care/ADL training;Therapeutic activities;DME and/or AE instruction;Patient/family education   OT Goals Acute Rehab OT Goals OT Goal Formulation: With patient Time For Goal Achievement: 04/06/13 Potential to Achieve Goals: Good ADL Goals Pt Will Perform Grooming: Standing at sink;with min assist ADL Goal: Grooming - Progress: Goal set today Pt Will Perform Lower Body Bathing: Sit to stand from chair;Sit to stand from bed;with adaptive equipment;with min assist ADL Goal: Lower Body Bathing - Progress: Goal set today Pt Will Perform Lower Body Dressing: with adaptive equipment;Sit to stand from chair;Sit to stand from bed;with min assist ADL Goal: Lower Body Dressing - Progress: Goal set today Pt Will Transfer to Toilet: with min assist;Ambulation;3-in-1 ADL Goal: Toilet Transfer - Progress: Goal set today Pt Will Perform Toileting - Clothing Manipulation: with min assist;Standing ADL Goal: Toileting - Clothing Manipulation - Progress: Goal set today  Visit Information  Last OT Received On: 03/30/13 Assistance Needed: +2    Subjective Data  Subjective: I am ok in the chair for now Patient Stated Goal: get through rehab to go back home   Prior Functioning     Home Living Lives With: Alone Type of Home: Skilled Nursing Facility Prior Function Level of Independence: Independent Communication Communication: HOH         Vision/Perception     Cognition  Cognition Arousal/Alertness: Awake/alert Behavior During Therapy: WFL for tasks assessed/performed Overall Cognitive Status: Within Functional Limits for tasks assessed [facility])     Extremity/Trunk Assessment Right Upper Extremity Assessment RUE ROM/Strength/Tone: [facility] for  tasks assessed Left Upper Extremity Assessment LUE ROM/Strength/Tone: WFL for tasks assessed     Mobility Transfers Transfers: Sit to Stand;Stand to Sit Sit to Stand: 3: Mod assist;With upper extremity assist;From chair/3-in-1 Stand to Sit: 3: Mod assist;With upper extremity assist;To chair/3-in-1 Details for Transfer Assistance: verbal cues for hand placement and L LE management     Exercise     Balance Balance Balance Assessed: Yes Dynamic Standing Balance Dynamic Standing - Level of Assistance: 3: Mod assist   End of Session OT - End of Session Equipment Utilized During Treatment: Gait belt Activity Tolerance: Patient limited by pain Patient left: in chair;with call bell/phone within reach;with family/visitor present  GO     Lennox Laity [phone] 03/30/2013, 12:39 PM

## 2013-03-30 NOTE — Progress Notes (Signed)
Physical Therapy Treatment Patient Details Name: Stanley Robles MRN: [id number] DOB: 20-Jan-1932 Today's Date: 03/30/2013 Time: 1478-2956 PT Time Calculation (min): 39 min  PT Assessment / Plan / Recommendation Comments on Treatment Session  POD # 2 L TKR s/p CVA and hearing loss.  Performed TKR TE's then assist OOB to amb in hallway.  Pt plans to D/C to SNF for ST Rehab.    Follow Up Recommendations  SNF     Does the patient have the potential to tolerate intense rehabilitation     Barriers to Discharge        Equipment Recommendations  None recommended by PT    Recommendations for Other Services    Frequency 7X/week   Plan Discharge plan remains appropriate;Frequency remains appropriate    Precautions / Restrictions Precautions Precautions: Knee Precaution Comments: Instructed pt on KI use and proper application Required Braces or Orthoses: Knee Immobilizer - Left Knee Immobilizer - Left: Discontinue once straight leg raise with < 10 degree lag Restrictions Weight Bearing Restrictions: No Other Position/Activity Restrictions: WBAT   Pertinent Vitals/Pain C/o 4/10 L knee pain with TE's ICE applied    Mobility  Bed Mobility Bed Mobility: Supine to Sit Supine to Sit: 3: Mod assist Details for Bed Mobility Assistance: support to get LLE off bed. Transfers Transfers: Sit to Stand;Stand to Sit Sit to Stand: 3: Mod assist;With upper extremity assist;From bed Stand to Sit: 3: Mod assist;With upper extremity assist;To chair/3-in-1 Details for Transfer Assistance: verbal cues for hand placement and L LE management Ambulation/Gait Ambulation/Gait Assistance: 1: +2 Total assist Ambulation Distance (Feet): 5 Feet Assistive device: Rolling walker Ambulation/Gait Assistance Details: 75% VC's on proper sequencing and proper walker to self distance. Also 50% VC's to increase WB thru L LE. Gait Pattern: Step-to pattern;Antalgic;Trunk flexed Gait velocity: decreased    Exercises    Total Knee Replacement TE's 10 reps B LE ankle pumps 10 reps knee presses 10 reps heel slides  10 reps SAQ's 10 reps SLR's 10 reps ABD Followed by ICE    PT Goals                                            progressing    Visit Information  Last PT Received On: 03/30/13 Assistance Needed: +2    Subjective Data      Cognition  Cognition Arousal/Alertness: Awake/alert Behavior During Therapy: WFL for tasks assessed/performed Overall Cognitive Status: Within Functional Limits for tasks assessed (HOH)    Balance  Balance Balance Assessed: Yes Dynamic Standing Balance Dynamic Standing - Level of Assistance: 3: Mod assist  End of Session PT - End of Session Equipment Utilized During Treatment: Left knee immobilizer Activity Tolerance: Patient tolerated treatment well Patient left: in chair;with call bell/phone within reach;with family/visitor present   Felecia Shelling  PTA [facility]  Acute  Rehab Pager      [phone]

## 2013-03-30 NOTE — Care Management Note (Addendum)
    Page 1 of 1   04/04/2013     2:31:28 PM   CARE MANAGEMENT NOTE 04/04/2013  Patient:  DAYMEON, FISCHMAN   Account Number:  [id number]  Date Initiated:  03/30/2013  Documentation initiated by:  Colleen Can  Subjective/Objective Assessment:   dx total left knee replacemnt     Action/Plan:   SNF rehab   Anticipated DC Date:  04/04/2013   Anticipated DC Plan:  SKILLED NURSING FACILITY  In-house referral  Clinical Social Worker      DC Planning Services  CM consult      Choice offered to / List presented to:             Status of service:  Completed, signed off Medicare Important Message given?  NA - LOS <3 / Initial given by admissions (If response is "NO", the following Medicare IM given date fields will be blank) Date Medicare IM given:   Date Additional Medicare IM given:    Discharge Disposition:  SKILLED NURSING FACILITY  Per UR Regulation:    If discussed at Long Length of Stay Meetings, dates discussed:    Comments:  04/04/13 Lanier Clam RN,BSN NCM [phone] D/C SNF.  [id number] Earlene Plater, RN, BSN, CCM:  CHART REVIEWED AND UPDATED. Patient transfer to sdu/icu on pm of [id number] with a.fib and placed on iv cardizem drip.  Next chart review due on [id number]. NO DISCHARGE NEEDS PRESENT AT THIS TIME.  Plan is to go to snf s/p hos may be transferred to [facility] per family wish. CASE MANAGEMENT [phone]

## 2013-03-30 NOTE — Progress Notes (Signed)
ANTICOAGULATION CONSULT NOTE - Follow Up Consult  Pharmacy Consult for [facility] Indication: Hx of atrial fibrillation; also s/p L TKA 5/5  No Known Allergies  Patient Measurements: Height: 5\' 8"  (172.7 cm) Weight: 179 lb (81.194 kg) IBW/kg (Calculated) : 68.4  Vital Signs: Temp: 98.6 F (37 C) (05/07 0552) Temp src: Oral (05/07 0552) BP: 135/61 mmHg (05/07 0925) Pulse Rate: 68 (05/07 0925)  Labs:  Recent Labs  03/29/13 0502 03/30/13 0500  HGB 12.1* 10.2*  HCT 35.4* 30.5*  PLT 83* 109*  LABPROT 13.5 16.6*  INR 1.04 1.38  CREATININE 1.51* 1.87*    Estimated Creatinine Clearance: 31.5 ml/min (by C-G formula based on Cr of 1.87).   Medications:  Scheduled:  . [COMPLETED] acetaminophen  1,000 mg Intravenous Q6H  . amLODipine  5 mg Oral Daily  . atenolol  25 mg Oral Daily  . atorvastatin  40 mg Oral q1800  . docusate sodium  100 mg Oral BID  . enoxaparin (LOVENOX) injection  30 mg Subcutaneous Q12H  . tamsulosin  0.4 mg Oral QPC supper  . [COMPLETED] warfarin  3 mg Oral ONCE-1800  . Warfarin - Pharmacist Dosing Inpatient   Does not apply q1800   Infusions:  . dextrose 5 % and 0.9% NaCl Stopped (03/30/13 1000)  Inpatient warfarin doses: 3mg  (5/5), 3mg  (5/6)  Assessment: 79 y/o M on chronic warfarin for h/o atrial fibrillation, anticoagulation interrupted for L TKA, which was done 03/28/13  Home warfarin dosage 2mg  daily. Also on aspirin 81mg  daily, but currently on hold per ortho.  INR beginning to respond (1.38) after resuming warfarin post op 5/5  Lovenox 30mg  q12h until INR > or = 1.8 started POD#1 - Pltc low but improved (111k prior to starting lovenox).  H/H decreased postop, no bleeding/complications reported.  Goal of Therapy:  INR 2-3 Monitor platelets by anticoagulation protocol: Yes   Plan:   Repeat warfarin 3mg  today at 1800  Continue lovenox as ordered, watch closely for bleeding with low platelets  Daily PT/INR  Loralee Pacas, PharmD,  BCPS Pager: [phone] 03/30/2013,10:30 AM

## 2013-03-30 NOTE — Progress Notes (Signed)
Physical Therapy Treatment Patient Details Name: Stanley Robles MRN: [id number] DOB: [birth year] Today's Date: 03/30/2013 Time: 7829-5621 PT Time Calculation (min): 28 min  PT Assessment / Plan / Recommendation Comments on Treatment Session  POD # 2 L TKR pm session.  Amb limited distance to BR then back to bed.  Pt c/o MAX nausea with inability to have a BM.  Reported to RN. Pt also spitting up dark brownish mucous.    Follow Up Recommendations  SNF     Does the patient have the potential to tolerate intense rehabilitation     Barriers to Discharge        Equipment Recommendations  None recommended by PT    Recommendations for Other Services    Frequency 7X/week   Plan Discharge plan remains appropriate;Frequency remains appropriate    Precautions / Restrictions Precautions Precautions: Knee Precaution Comments: Instructed pt on KI use and proper application Required Braces or Orthoses: Knee Immobilizer - Left Knee Immobilizer - Left: Discontinue once straight leg raise with < 10 degree lag Restrictions Weight Bearing Restrictions: No Other Position/Activity Restrictions: WBAT   Pertinent Vitals/Pain C/o MAX nausea    Mobility  Bed Mobility Bed Mobility: Sit to Supine Supine to Sit: 3: Mod assist Sit to Supine: 3: Mod assist Details for Bed Mobility Assistance: Mod assist to support L LE up on  to bed Transfers Transfers: Sit to Stand;Stand to Sit Sit to Stand: 3: Mod assist;With upper extremity assist;From bed Stand to Sit: 3: Mod assist;With upper extremity assist;To chair/3-in-1 Details for Transfer Assistance: verbal cues for hand placement and L LE management and increased time Ambulation/Gait Ambulation/Gait Assistance: 3: Mod assist Ambulation Distance (Feet): 70 Feet Assistive device: Rolling walker Ambulation/Gait Assistance Details: 50% VC's on proper walker placement as pt steps too far to the front.  MAX c/o nausea.  Amb to BR for attempted BM then to  bed. Gait Pattern: Step-to pattern;Antalgic;Trunk flexed Gait velocity: decreased        PT Goals                                           progressing    Visit Information  Last PT Received On: 03/30/13 Assistance Needed: +1    Subjective Data      Cognition  Cognition Arousal/Alertness: Awake/alert Behavior During Therapy: WFL for tasks assessed/performed Overall Cognitive Status: Within Functional Limits for tasks assessed (HOH)    Balance  Balance Balance Assessed: Yes Dynamic Standing Balance Dynamic Standing - Level of Assistance: 3: Mod assist  End of Session PT - End of Session Equipment Utilized During Treatment: Left knee immobilizer Activity Tolerance: Other (comment) (nausea) Patient left: in bed;with bed alarm set Nurse Communication: Mobility status;Other (comment) (nausea)   Felecia Shelling  PTA [facility]  Acute  Rehab Pager      [phone]

## 2013-03-31 DIAGNOSIS — K5900 Constipation, unspecified: Secondary | ICD-10-CM

## 2013-03-31 DIAGNOSIS — D62 Acute posthemorrhagic anemia: Secondary | ICD-10-CM

## 2013-03-31 DIAGNOSIS — Z96659 Presence of unspecified artificial knee joint: Secondary | ICD-10-CM

## 2013-03-31 DIAGNOSIS — R9431 Abnormal electrocardiogram [ECG] [EKG]: Secondary | ICD-10-CM

## 2013-03-31 LAB — CBC
Hemoglobin: 8.5 g/dL — ABNORMAL LOW (ref 13.0–17.0)
MCH: 31.6 pg (ref 26.0–34.0)
MCV: 91.8 fL (ref 78.0–100.0)
Platelets: 91 10*3/uL — ABNORMAL LOW (ref 150–400)
RBC: 2.69 MIL/uL — ABNORMAL LOW (ref 4.22–5.81)

## 2013-03-31 MED ORDER — ALUM & MAG HYDROXIDE-SIMETH 200-200-20 MG/5ML PO SUSP: 30.0000 mL | Freq: Four times a day (QID) | ORAL | Status: DC | PRN

## 2013-03-31 MED ORDER — TRAMADOL HCL 50 MG PO TABS: 50.0000 mg | ORAL_TABLET | Freq: Four times a day (QID) | ORAL | Status: DC | PRN

## 2013-03-31 MED ORDER — METHOCARBAMOL 500 MG PO TABS: 500.0000 mg | ORAL_TABLET | Freq: Four times a day (QID) | ORAL | Status: DC | PRN

## 2013-03-31 MED ORDER — POLYETHYLENE GLYCOL 3350 17 G PO PACK: 17.0000 g | PACK | Freq: Every day | ORAL | Status: DC | PRN

## 2013-03-31 MED ORDER — WARFARIN 0.5 MG HALF TABLET
0.5000 mg | ORAL_TABLET | Freq: Once | ORAL | Status: AC
  Filled 2013-03-31 (×2): qty 1

## 2013-03-31 MED ORDER — METOCLOPRAMIDE HCL 5 MG PO TABS: 5.0000 mg | ORAL_TABLET | Freq: Three times a day (TID) | ORAL | Status: DC | PRN

## 2013-03-31 MED ORDER — BISACODYL 10 MG RE SUPP
10.0000 mg | Freq: Once | RECTAL | Status: AC
  Administered 2013-03-31: 10 mg via RECTAL
  Filled 2013-03-31: qty 1

## 2013-03-31 MED ORDER — BISACODYL 10 MG RE SUPP: 10.0000 mg | Freq: Every day | RECTAL | Status: DC | PRN

## 2013-03-31 MED ORDER — DSS 100 MG PO CAPS: 100.0000 mg | ORAL_CAPSULE | Freq: Two times a day (BID) | ORAL | Status: DC

## 2013-03-31 MED ORDER — DIPHENHYDRAMINE HCL 12.5 MG/5ML PO ELIX: 12.5000 mg | ORAL_SOLUTION | ORAL | Status: DC | PRN

## 2013-03-31 MED ORDER — DEXTROSE 5 % IV SOLN
5.0000 mg/h | INTRAVENOUS | Status: DC
  Administered 2013-03-31: 5 mg/h via INTRAVENOUS
  Administered 2013-04-01: 10 mg/h via INTRAVENOUS

## 2013-03-31 MED ORDER — ONDANSETRON HCL 4 MG PO TABS: 4.0000 mg | ORAL_TABLET | Freq: Four times a day (QID) | ORAL | Status: DC | PRN

## 2013-03-31 MED ORDER — DILTIAZEM HCL 25 MG/5ML IV SOLN
10.0000 mg | Freq: Once | INTRAVENOUS | Status: AC
  Administered 2013-03-31: 10 mg via INTRAVENOUS
  Filled 2013-03-31: qty 5

## 2013-03-31 NOTE — Progress Notes (Signed)
Pt's heart rate was fluctuating from 125 to 150 at 2140. Pt denied chest pain and is alert and oriented. Called rapid response nurse Nehemiah Settle) at 2155. Brooke placed pt on cardiac monitor and did an EKG at 2211 which showed afib. Paged on call dr for Dr. Lequita Halt at 2215 and 2220. RR RN called PCCM and spoke with Dr. Herma Carson who gave orders to give 10 mg of IV Cardizem @ 2235. HR unchanged at 2235. Dr. Ranell Patrick called and RR RN updated him on pt's status. Received orders to transfer pt down to [place] and for [facility] to manage pt medically. Pt transferred down to room 1230 in [place] at 2305. Gave report to [facility] RN in [place].

## 2013-03-31 NOTE — Progress Notes (Signed)
Physical Therapy Treatment Patient Details Name: Stanley Robles MRN: [id number] DOB: [birth year] Today's Date: 03/31/2013 Time: 4098-1191 PT Time Calculation (min): 27 min  PT Assessment / Plan / Recommendation Comments on Treatment Session  POD # 3 L TKR am session.  Pt staed, "Don't mind me, I am grumpy today".  Pt still c/o ABD discomfort but the nausea is less. Assisted OOB to amb in hallway then performed TE's.  Pt plans to D/C to SNF for Rehab.    Follow Up Recommendations  SNF     Does the patient have the potential to tolerate intense rehabilitation     Barriers to Discharge        Equipment Recommendations  None recommended by PT    Recommendations for Other Services    Frequency 7X/week   Plan Discharge plan remains appropriate;Frequency remains appropriate    Precautions / Restrictions Precautions Precautions: Knee Precaution Comments: Instructed pt on KI use and proper application Required Braces or Orthoses: Knee Immobilizer - Left Knee Immobilizer - Left: Discontinue once straight leg raise with < 10 degree lag Restrictions Weight Bearing Restrictions: No Other Position/Activity Restrictions: WBAT   Pertinent Vitals/Pain C/o 4/10 knee pain during activity ICE applied    Mobility  Bed Mobility Bed Mobility: Sit to Supine Supine to Sit: 3: Mod assist Details for Bed Mobility Assistance: Mod assist to support L LE up on  to bed and increased time Transfers Transfers: Sit to Stand;Stand to Sit Sit to Stand: 3: Mod assist;With upper extremity assist;From bed;4: Min assist Stand to Sit: 3: Mod assist;With upper extremity assist;To chair/3-in-1;4: Min assist Details for Transfer Assistance: verbal cues for hand placement and L LE management and increased time Ambulation/Gait Ambulation/Gait Assistance: 3: Mod assist;4: Min assist Ambulation Distance (Feet): 85 Feet Assistive device: Rolling walker Ambulation/Gait Assistance Details: 50% VC's on proper sequencing  and proper walker to self distance as pt tends to step too far to the front.  Still c/o ABD discomfirt but less c/o nausea. Gait Pattern: Step-to pattern;Antalgic;Trunk flexed Gait velocity: decreased    Exercises   Total Knee Replacement TE's 10 reps B LE ankle pumps 10 reps knee presses 10 reps heel slides  10 reps SAQ's 10 reps SLR's 10 reps ABD Followed by ICE    PT Goals                                                progressing    Visit Information  Last PT Received On: 03/31/13 Assistance Needed: +1    Subjective Data  Subjective: Don't mind me, I am grumpy today Patient Stated Goal: to have a good poop   Cognition    good   Balance   fair  End of Session PT - End of Session Equipment Utilized During Treatment: Left knee immobilizer Activity Tolerance: Patient limited by fatigue Patient left: in chair;with call bell/phone within reach;with family/visitor present   Felecia Shelling  PTA [facility]  Acute  Rehab Pager      [phone]

## 2013-03-31 NOTE — Progress Notes (Addendum)
RRT called to room 1614 at 2155 for pt heart rate irregular and elevated. Upon my arrival pt resting in bed, denies pain, alert and oriented. Lung sounds clear, o2 sat 92% on 3 LNC. Pt family at bedside. Pt placed on cardiac monitor, showing svt/afib rate 140s. EKG obtained at 2211 revealing afib. Pt post op day 3 left total knee per Dr Berton Lan, history of afib has received his atenolol today at 1100. Dr Berton Lan paged at 2215. Dr Ranell Patrick on call for Dr Berton Lan paged again at 2220. Dr Herma Carson with PCCM, called at 2230, Cardizem 10mg  IVP given 2235. HR 145 at 2235, unchanged. Call received per Dr Ranell Patrick at 2240. MD updated on pt status, orders received to transfer to SD. [facility] to be called per MD to manage pt medically. Pt tranferred to SDU room 1230 at 2305. SD RN Cassie received report per Elpidio Galea from [facility]. Dr Adela Glimpse at bedside 2330.

## 2013-03-31 NOTE — Discharge Summary (Signed)
Physician Discharge Summary   Patient ID: Stanley Robles MRN: [id number] DOB/AGE: [birth year] 77 y.o.  Admit date: 03/28/2013 Discharge date: Tentative Date of Discharge - 03/31/2013  Primary Diagnosis:  Osteoarthritis Left knee  Admission Diagnoses:  Past Medical History  Diagnosis Date  . Dysrhythmia     HX OF ATRIAL FIB - CHRONIC COUMADIN  . Hyperlipidemia   . Peripheral vascular disease   . Chronic kidney disease     STAGE 3  . Coronary artery disease     DR. TILLEY IS PT'S CARDIOLGIST  . Myocardial infarction 1994  . Gout     USUALLY IN THE FEET  . BPH (benign prostatic hypertrophy)   . Stroke     NO DEFICITS  . Hypertension   . GERD (gastroesophageal reflux disease)     ROLAIDS IF NEEDED  . 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 Arthritis   . Tooth disease     PT STATES HE IS SCHEDULED TO HAVE BAD TOOTH PULLED 03/24/13 - WILL CHECK WITH DR. Lequita Halt TO MAKE SURE THIS IS OK TO DO BEFORE HIS PLANNED KNEE REPLACEMENT ON 5/5.   Discharge Diagnoses:   Principal Problem:   OA (osteoarthritis) of knee Active Problems:   Postoperative anemia due to acute blood loss   Postop Hyponatremia  Estimated body mass index is 27.22 kg/(m^2) as calculated from the following:   Height as of this encounter: 5\' 8"  (1.727 m).   Weight as of this encounter: 81.194 kg (179 lb).  Procedure:  Procedure(s) (LRB): LEFT TOTAL KNEE ARTHROPLASTY (Left)   Consults: None  HPI: Stanley Robles is a 79 y.o. year old male with end stage OA of his left knee with progressively worsening pain and dysfunction. He has constant pain, with activity and at rest and significant functional deficits with difficulties even with ADLs. He has had extensive non-op management including analgesics, injections of cortisone and viscosupplements, and home exercise program, but remains in significant pain with significant dysfunction. Radiographs show bone on bone arthritis medial and patellofemoral.  He presents now for left Total Knee Arthroplasty.   Laboratory Data: Admission on 03/28/2013  Component Date Value Range Status  . ABO/RH(D) 03/21/2013 O POS   Final  . WBC 03/29/2013 8.2  4.0 - 10.5 K/uL Final  . RBC 03/29/2013 3.82* 4.22 - 5.81 MIL/uL Final  . Hemoglobin 03/29/2013 12.1* 13.0 - 17.0 g/dL Final  . HCT 84/69/6295 35.4* 39.0 - 52.0 % Final  . MCV 03/29/2013 92.7  78.0 - 100.0 fL Final  . MCH 03/29/2013 31.7  26.0 - 34.0 pg Final  . MCHC 03/29/2013 34.2  30.0 - 36.0 g/dL Final  . RDW 28/41/3244 13.7  11.5 - 15.5 % Final  . Platelets 03/29/2013 83* 150 - 400 K/uL Final   CONSISTENT WITH PREVIOUS RESULT  . Sodium 03/29/2013 132* 135 - 145 mEq/L Final  . Potassium 03/29/2013 4.2  3.5 - 5.1 mEq/L Final  . Chloride 03/29/2013 99  96 - 112 mEq/L Final  . CO2 03/29/2013 22  19 - 32 mEq/L Final  . Glucose, Bld 03/29/2013 199* 70 - 99 mg/dL Final  . BUN 11/26/7251 18  6 - 23 mg/dL Final  . Creatinine, Ser 03/29/2013 1.51* 0.50 - 1.35 mg/dL Final  . Calcium 66/44/0347 8.3* 8.4 - 10.5 mg/dL Final  . GFR calc non Af Amer 03/29/2013 42* >90 mL/min Final  . GFR calc Af Amer 03/29/2013 49* >90 mL/min Final  Comment:                                 The eGFR has been calculated                          using the CKD EPI equation.                          This calculation has not been                          validated in all clinical                          situations.                          eGFR's persistently                          <90 mL/min signify                          possible Chronic Kidney Disease.  Marland Kitchen Prothrombin Time 03/29/2013 13.5  11.6 - 15.2 seconds Final  . INR 03/29/2013 1.04  0.00 - 1.49 Final  . WBC 03/30/2013 12.8* 4.0 - 10.5 K/uL Final  . RBC 03/30/2013 3.30* 4.22 - 5.81 MIL/uL Final  . Hemoglobin 03/30/2013 10.2* 13.0 - 17.0 g/dL Final  . HCT 09/60/4540 30.5* 39.0 - 52.0 % Final  . MCV 03/30/2013 92.4  78.0 - 100.0 fL Final  . MCH 03/30/2013 30.9   26.0 - 34.0 pg Final  . MCHC 03/30/2013 33.4  30.0 - 36.0 g/dL Final  . RDW 98/09/9146 14.0  11.5 - 15.5 % Final  . Platelets 03/30/2013 109* 150 - 400 K/uL Final   Comment: SPECIMEN CHECKED FOR CLOTS                          REPEATED TO VERIFY                          DELTA CHECK NOTED  . Sodium 03/30/2013 131* 135 - 145 mEq/L Final  . Potassium 03/30/2013 4.5  3.5 - 5.1 mEq/L Final  . Chloride 03/30/2013 98  96 - 112 mEq/L Final  . CO2 03/30/2013 26  19 - 32 mEq/L Final  . Glucose, Bld 03/30/2013 138* 70 - 99 mg/dL Final  . BUN 82/95/6213 28* 6 - 23 mg/dL Final  . Creatinine, Ser 03/30/2013 1.87* 0.50 - 1.35 mg/dL Final  . Calcium 08/65/7846 8.5  8.4 - 10.5 mg/dL Final  . GFR calc non Af Amer 03/30/2013 33* >90 mL/min Final  . GFR calc Af Amer 03/30/2013 38* >90 mL/min Final   Comment:                                 The eGFR has been calculated                          using the CKD EPI equation.  This calculation has not been                          validated in all clinical                          situations.                          eGFR's persistently                          <90 mL/min signify                          possible Chronic Kidney Disease.  Marland Kitchen Prothrombin Time 03/30/2013 16.6* 11.6 - 15.2 seconds Final  . INR 03/30/2013 1.38  0.00 - 1.49 Final  . WBC 03/31/2013 7.2  4.0 - 10.5 K/uL Final  . RBC 03/31/2013 2.69* 4.22 - 5.81 MIL/uL Final  . Hemoglobin 03/31/2013 8.5* 13.0 - 17.0 g/dL Final  . HCT 16/08/9603 24.7* 39.0 - 52.0 % Final  . MCV 03/31/2013 91.8  78.0 - 100.0 fL Final  . MCH 03/31/2013 31.6  26.0 - 34.0 pg Final  . MCHC 03/31/2013 34.4  30.0 - 36.0 g/dL Final  . RDW 54/07/8118 14.2  11.5 - 15.5 % Final  . Platelets 03/31/2013 91* 150 - 400 K/uL Final   Comment: SPECIMEN CHECKED FOR CLOTS                          PLATELET COUNT CONFIRMED BY SMEAR                          REPEATED TO VERIFY  . Prothrombin Time 03/31/2013 22.7*  11.6 - 15.2 seconds Final  . INR 03/31/2013 2.10* 0.00 - 1.49 Final  Hospital Outpatient Visit on 03/21/2013  Component Date Value Range Status  . MRSA, PCR 03/21/2013 NEGATIVE  NEGATIVE Final  . Staphylococcus aureus 03/21/2013 NEGATIVE  NEGATIVE Final   Comment:                                 The Xpert SA Assay (FDA                          approved for NASAL specimens                          in patients over 47 years of age),                          is one component of                          a comprehensive surveillance                          program.  Test performance has                          been validated by [employer]  [facility] for patients greater                          than or equal to 60 year old.                          It is not intended                          to diagnose infection nor to                          guide or monitor treatment.  Marland Kitchen aPTT 03/21/2013 35  24 - 37 seconds Final  . WBC 03/21/2013 4.7  4.0 - 10.5 K/uL Final  . RBC 03/21/2013 4.86  4.22 - 5.81 MIL/uL Final  . Hemoglobin 03/21/2013 15.8  13.0 - 17.0 g/dL Final  . HCT 16/08/9603 45.7  39.0 - 52.0 % Final  . MCV 03/21/2013 94.0  78.0 - 100.0 fL Final  . MCH 03/21/2013 32.5  26.0 - 34.0 pg Final  . MCHC 03/21/2013 34.6  30.0 - 36.0 g/dL Final  . RDW 54/07/8118 13.8  11.5 - 15.5 % Final  . Platelets 03/21/2013 111* 150 - 400 K/uL Final   Comment: SPECIMEN CHECKED FOR CLOTS                          REPEATED TO VERIFY                          PLATELET COUNT CONFIRMED BY SMEAR  . Sodium 03/21/2013 139  135 - 145 mEq/L Final  . Potassium 03/21/2013 4.7  3.5 - 5.1 mEq/L Final  . Chloride 03/21/2013 105  96 - 112 mEq/L Final  . CO2 03/21/2013 29  19 - 32 mEq/L Final  . Glucose, Bld 03/21/2013 94  70 - 99 mg/dL Final  . BUN 14/78/2956 17  6 - 23 mg/dL Final  . Creatinine, Ser 03/21/2013 1.66* 0.50 - 1.35 mg/dL Final  . Calcium 21/30/8657 9.4  8.4 - 10.5 mg/dL Final  . Total  Protein 03/21/2013 6.7  6.0 - 8.3 g/dL Final  . Albumin 84/69/6295 3.5  3.5 - 5.2 g/dL Final  . AST 28/41/3244 18  0 - 37 U/L Final  . ALT 03/21/2013 11  0 - 53 U/L Final  . Alkaline Phosphatase 03/21/2013 69  39 - 117 U/L Final  . Total Bilirubin 03/21/2013 0.5  0.3 - 1.2 mg/dL Final  . GFR calc non Af Amer 03/21/2013 38* >90 mL/min Final  . GFR calc Af Amer 03/21/2013 44* >90 mL/min Final   Comment:                                 The eGFR has been calculated                          using the CKD EPI equation.                          This calculation has not been  validated in all clinical                          situations.                          eGFR's persistently                          <90 mL/min signify                          possible Chronic Kidney Disease.  Marland Kitchen Prothrombin Time 03/21/2013 15.2  11.6 - 15.2 seconds Final  . INR 03/21/2013 1.22  0.00 - 1.49 Final  . ABO/RH(D) 03/21/2013 O POS   Final  . Antibody Screen 03/21/2013 NEG   Final  . Sample Expiration 03/21/2013 03/31/2013   Final  . Color, Urine 03/21/2013 YELLOW  YELLOW Final  . APPearance 03/21/2013 CLEAR  CLEAR Final  . Specific Gravity, Urine 03/21/2013 1.015  1.005 - 1.030 Final  . pH 03/21/2013 6.0  5.0 - 8.0 Final  . Glucose, UA 03/21/2013 NEGATIVE  NEGATIVE mg/dL Final  . Hgb urine dipstick 03/21/2013 TRACE* NEGATIVE Final  . Bilirubin Urine 03/21/2013 NEGATIVE  NEGATIVE Final  . Ketones, ur 03/21/2013 NEGATIVE  NEGATIVE mg/dL Final  . Protein, ur 57/84/6962 NEGATIVE  NEGATIVE mg/dL Final  . Urobilinogen, UA 03/21/2013 0.2  0.0 - 1.0 mg/dL Final  . Nitrite 95/28/4132 NEGATIVE  NEGATIVE Final  . Leukocytes, UA 03/21/2013 NEGATIVE  NEGATIVE Final  . Squamous Epithelial / LPF 03/21/2013 RARE  RARE Final  . RBC / HPF 03/21/2013 0-2  <3 RBC/hpf Final     X-Rays:Dg Chest 2 View  03/21/2013  *RADIOLOGY REPORT*  Clinical Data: Preop for left knee arthroplasty  CHEST - 2 VIEW   Comparison: 08/08/2011  Findings: The cardiomediastinal silhouette is stable.  Mild degenerative changes thoracic spine.  No acute infiltrate or pulmonary edema. Atherosclerotic calcifications of thoracic aorta.  IMPRESSION: No active disease.  No significant change.   Original Report Authenticated By: Natasha Mead, M.D.    Dg Abd 1 View  03/30/2013  *RADIOLOGY REPORT*  Clinical Data: 79 year old male abdominal distention, pain, nausea.  ABDOMEN - 1 VIEW  Comparison: [facility] CT abdomen and pelvis 01/07/2008.  Findings: Nonobstructed bowel gas pattern.  Chronic degenerative changes in the lumbar spine with mild scoliosis.  Chronic small pelvic phleboliths. No acute osseous abnormality identified.  IMPRESSION: Nonobstructed bowel gas pattern.   Original Report Authenticated By: Erskine Speed, M.D.     EKG: Orders placed during the hospital encounter of 08/08/11  . EKG  . EKG  . EKG  . EKG     Hospital Course: Stanley Robles is a 79 y.o. who was admitted to [facility]. They were brought to the operating room on 03/28/2013 and underwent Procedure(s): LEFT TOTAL KNEE ARTHROPLASTY.  Patient tolerated the procedure well and was later transferred to the recovery room and then to the orthopaedic floor for postoperative care.  They were given PO and IV analgesics for pain control following their surgery.  They were given 24 hours of postoperative antibiotics of  Anti-infectives   Start     Dose/Rate Route Frequency Ordered Stop   03/28/13 1600  ceFAZolin (ANCEF) IVPB 1 g/50 mL premix     1 g 100 mL/hr over 30 Minutes Intravenous Every 6 hours  03/28/13 1336 03/28/13 2226   03/28/13 0645  ceFAZolin (ANCEF) IVPB 2 g/50 mL premix    Comments:  Dose reduced to 2g per P&T policy for weight < 120kg (weight 81kg)   2 g 100 mL/hr over 30 Minutes Intravenous On call to O.R. 03/28/13 4098 03/28/13 0923     and started on DVT prophylaxis in the form of Lovenox and Coumadin.   PT and OT were ordered for  total joint protocol.  Discharge planning consulted to help with postop disposition and equipment needs.  Patient had a decent night on the evening of surgery. They wanted to look into SNF following the hospital stay.  They started to get up OOB with therapy on day one. Hemovac drain was pulled without difficulty.  Continued to work with therapy into day two. Pain was under better control.  Dressing was changed on day two and the incision was healing well.  By day three, the patient had progressed with therapy and meeting their goals.  Incision was healing well.  Patient was seen in rounds and due to the vomiting the evening before, they were monitored for any further nausea.  As long as they did well with the liquid breakfast and a regular lunch, then would transfer to the SNF on Thursday 03/31/2013.   Discharge Medications: Current Discharge Medication List    START taking these medications   Details  alum & mag hydroxide-simeth (MAALOX/MYLANTA) 200-200-20 MG/5ML suspension Take 30 mLs by mouth every 6 (six) hours as needed. Qty: 355 mL, Refills: 0    bisacodyl (DULCOLAX) 10 MG suppository Place 1 suppository (10 mg total) rectally daily as needed. Qty: 12 suppository, Refills: 0    diphenhydrAMINE (BENADRYL) 12.5 MG/5ML elixir Take 5-10 mLs (12.5-25 mg total) by mouth every 4 (four) hours as needed for itching. Qty: 120 mL, Refills: 0    docusate sodium 100 MG CAPS Take 100 mg by mouth 2 (two) times daily. Qty: 60 capsule, Refills: 0    methocarbamol (ROBAXIN) 500 MG tablet Take 1 tablet (500 mg total) by mouth every 6 (six) hours as needed. Qty: 80 tablet, Refills: 0    metoCLOPramide (REGLAN) 5 MG tablet Take 1-2 tablets (5-10 mg total) by mouth every 8 (eight) hours as needed (if ondansetron (ZOFRAN) ineffective.). Qty: 40 tablet, Refills: 0    ondansetron (ZOFRAN) 4 MG tablet Take 1 tablet (4 mg total) by mouth every 6 (six) hours as needed for nausea. Qty: 40 tablet, Refills: 0      polyethylene glycol (MIRALAX / GLYCOLAX) packet Take 17 g by mouth daily as needed. Qty: 14 each, Refills: 0    traMADol (ULTRAM) 50 MG tablet Take 1-2 tablets (50-100 mg total) by mouth every 6 (six) hours as needed (mild pain). Qty: 60 tablet, Refills: 0      CONTINUE these medications which have NOT CHANGED   Details  acetaminophen (TYLENOL) 500 MG tablet Take 1,000 mg by mouth every 6 (six) hours as needed for pain.    amLODipine (NORVASC) 5 MG tablet Take 5 mg by mouth daily before breakfast.    atenolol (TENORMIN) 25 MG tablet Take 25 mg by mouth daily before breakfast.    indomethacin (INDOCIN) 50 MG capsule Take 50 mg by mouth daily as needed (for gout flare ups).    aspirin EC 81 MG tablet Take 81 mg by mouth daily.    Ca Carbonate-Mag Hydroxide (ROLAIDS PO) Take by mouth. PRN    nitroGLYCERIN (NITROSTAT) 0.4 MG SL tablet  Place 0.4 mg under the tongue every 5 (five) minutes as needed for chest pain.    rosuvastatin (CRESTOR) 20 MG tablet Take 20 mg by mouth every other day.    tamsulosin (FLOMAX) 0.4 MG CAPS Take 0.4 mg by mouth daily after supper.      STOP taking these medications     OVER THE COUNTER MEDICATION      warfarin (COUMADIN) 4 MG tablet         Diet: Cardiac diet Activity:WBAT Follow-up:in 2 weeks Disposition - Skilled nursing facility - [facility] Discharged Condition: good   Discharge Orders   Future Orders Complete By Expires     Call MD / Call 911  As directed     Comments:      If you experience chest pain or shortness of breath, CALL 911 and be transported to the hospital emergency room.  If you develope a fever above 101 F, pus (white drainage) or increased drainage or redness at the wound, or calf pain, call your surgeon's office.    Change dressing  As directed     Comments:      Change dressing daily with sterile 4 x 4 inch gauze dressing and apply TED hose. Do not submerge the incision under water.    Constipation  Prevention  As directed     Comments:      Drink plenty of fluids.  Prune juice may be helpful.  You may use a stool softener, such as Colace (over the counter) 100 mg twice a day.  Use MiraLax (over the counter) for constipation as needed.    Diet - low sodium heart healthy  As directed     Discharge instructions  As directed     Comments:      Pick up stool softner and laxative for home. Do not submerge incision under water. May shower. Continue to use ice for pain and swelling from surgery.  Take Coumadin for three weeks for postoperative protocol and then the patient may resume their previous Coumadin home regimen.  The dose may need to be adjusted based upon the INR.  Please follow the INR and titrate Coumadin dose for a therapeutic range between 2.0 and 3.0 INR.  After completing the three weeks of Coumadin, the patient may resume their previous Coumadin home regimen.    Do not put a pillow under the knee. Place it under the heel.  As directed     Do not sit on low chairs, stoools or toilet seats, as it may be difficult to get up from low surfaces  As directed     Driving restrictions  As directed     Comments:      No driving until released by the physician.    Increase activity slowly as tolerated  As directed     Lifting restrictions  As directed     Comments:      No lifting until released by the physician.    Patient may shower  As directed     Comments:      You may shower without a dressing once there is no drainage.  Do not wash over the wound.  If drainage remains, do not shower until drainage stops.    TED hose  As directed     Comments:      Use stockings (TED hose) for 3 weeks on both leg(s).  You may remove them at night for sleeping.    Weight bearing  as tolerated  As directed         Medication List    ASK your doctor about these medications       acetaminophen 500 MG tablet  Commonly known as:  TYLENOL  Take 1,000 mg by mouth every 6 (six) hours as needed  for pain.     amLODipine 5 MG tablet  Commonly known as:  NORVASC  Take 5 mg by mouth daily before breakfast.     aspirin EC 81 MG tablet  Take 81 mg by mouth daily.     atenolol 25 MG tablet  Commonly known as:  TENORMIN  Take 25 mg by mouth daily before breakfast.     indomethacin 50 MG capsule  Commonly known as:  INDOCIN  Take 50 mg by mouth daily as needed (for gout flare ups).     nitroGLYCERIN 0.4 MG SL tablet  Commonly known as:  NITROSTAT  Place 0.4 mg under the tongue every 5 (five) minutes as needed for chest pain.     OVER THE COUNTER MEDICATION     ROLAIDS PO  Take by mouth. PRN     rosuvastatin 20 MG tablet  Commonly known as:  CRESTOR  Take 20 mg by mouth every other day.     tamsulosin 0.4 MG Caps  Commonly known as:  FLOMAX  Take 0.4 mg by mouth daily after supper.     warfarin 4 MG tablet  Commonly known as:  COUMADIN  Take  mg by mouth daily. Takes 1 tablet everyday. Take Coumadin for three weeks for postoperative protocol and then the patient may resume their previous Coumadin home regimen.  The dose may need to be adjusted based upon the INR.  Please follow the INR and titrate Coumadin dose for a therapeutic range between 2.0 and 3.0 INR.  After completing the three weeks of Coumadin, the patient may resume their previous Coumadin home regimen.         Follow-up Information   Follow up with Loanne Drilling, MD. Schedule an appointment as soon as possible for a visit in 2 weeks.   Contact information:   [address], [address] [place] [address] [phone]       Signed: Patrica Duel 03/31/2013, 11:03 AM

## 2013-03-31 NOTE — Progress Notes (Signed)
Physical Therapy Treatment Patient Details Name: Stanley Robles MRN: [id number] DOB: 1932/09/28 Today's Date: 03/31/2013 Time: 6578-4696 PT Time Calculation (min): 31 min  PT Assessment / Plan / Recommendation Comments on Treatment Session  POD # 3 L TKR pm session.  amb in hallway then assisted back to bed as pt was c/o his back hurting sitting in chair.    Follow Up Recommendations  SNF     Does the patient have the potential to tolerate intense rehabilitation     Barriers to Discharge        Equipment Recommendations  None recommended by PT    Recommendations for Other Services    Frequency 7X/week   Plan Discharge plan remains appropriate;Frequency remains appropriate    Precautions / Restrictions Precautions Precautions: Knee Precaution Comments: Instructed pt on KI use and proper application Required Braces or Orthoses: Knee Immobilizer - Left Knee Immobilizer - Left: Discontinue once straight leg raise with < 10 degree lag Restrictions Weight Bearing Restrictions: No Other Position/Activity Restrictions: WBAT   Pertinent Vitals/Pain C/o back pain reposotioned    Mobility  Bed Mobility Bed Mobility: Sit to Supine Supine to Sit: 3: Mod assist Details for Bed Mobility Assistance: Assisted pt back to bed Transfers Transfers: Sit to Stand;Stand to Sit Sit to Stand: 3: Mod assist;With upper extremity assist;From bed;4: Min assist Stand to Sit: 3: Mod assist;With upper extremity assist;To chair/3-in-1;4: Min assist Details for Transfer Assistance: verbal cues for hand placement and L LE management and increased time Ambulation/Gait Ambulation/Gait Assistance: 3: Mod assist;4: Min assist Ambulation Distance (Feet): 75 Feet Assistive device: Rolling walker Ambulation/Gait Assistance Details: 50% VC's on proper gait sequencing and proper walker to self distance. Gait Pattern: Step-to pattern;Antalgic;Trunk flexed Gait velocity: decreased    PT Goals                                                            progressing    Visit Information  Last PT Received On: 03/31/13 Assistance Needed: +1    Subjective Data  Subjective: I am ready to gio back to bed, my back hurts Patient Stated Goal: to have a good poop   Cognition   good    Balance   poor+  End of Session PT - End of Session Equipment Utilized During Treatment: Left knee immobilizer Activity Tolerance: Patient limited by fatigue Patient left: in bed;with call bell/phone within reach   Felecia Shelling  PTA [facility]  Acute  Rehab Pager      [phone]

## 2013-03-31 NOTE — Progress Notes (Signed)
Subjective: 3 Days Post-Op Procedure(s) (LRB): LEFT TOTAL KNEE ARTHROPLASTY (Left) Patient reports pain as mild.   Patient seen in rounds by Dr. Lequita Halt. Patient developed some nausea and vomiting yesterday.  Had a KUB last evening. ABDOMEN - 1 VIEW  Comparison: [facility] CT abdomen and pelvis 01/07/2008.  Findings: Nonobstructed bowel gas pattern. Chronic degenerative  changes in the lumbar spine with mild scoliosis. Chronic small  pelvic phleboliths. No acute osseous abnormality identified.  IMPRESSION:  Nonobstructed bowel gas pattern. Doing a little better this morning.  Plan is to go to SNF.  Patient will receive a liquid breakfast this morning, if tolerates breakfast, then try a solid lunch.  If tolerates lunch and no more nausea, them transfer later today. Patient is well, and has had no acute complaints or problems  Objective: Vital signs in last 24 hours: Temp:  [97.7 F (36.5 C)-99.3 F (37.4 C)] 99.3 F (37.4 C) (05/08 0545) Pulse Rate:  [67-85] 85 (05/08 0545) Resp:  [14-20] 20 (05/08 0545) BP: (98-145)/(57-65) 98/57 mmHg (05/08 0545) SpO2:  [89 %-93 %] 92 % (05/08 0545)  Intake/Output from previous day:  Intake/Output Summary (Last 24 hours) at 03/31/13 0818 Last data filed at 03/31/13 0734  Gross per 24 hour  Intake 1414.18 ml  Output    625 ml  Net 789.18 ml    Intake/Output this shift: Total I/O In: 182 [I.V.:182] Out: -   Labs:  Recent Labs  03/29/13 0502 03/30/13 0500 03/31/13 0540  HGB 12.1* 10.2* 8.5*    Recent Labs  03/30/13 0500 03/31/13 0540  WBC 12.8* 7.2  RBC 3.30* 2.69*  HCT 30.5* 24.7*  PLT 109* 91*    Recent Labs  03/29/13 0502 03/30/13 0500  NA 132* 131*  K 4.2 4.5  CL 99 98  CO2 22 26  BUN 18 28*  CREATININE 1.51* 1.87*  GLUCOSE 199* 138*  CALCIUM 8.3* 8.5    Recent Labs  03/30/13 0500 03/31/13 0540  INR 1.38 2.10*    EXAM: General - Patient is Alert, Appropriate and Oriented Extremity -  Neurovascular intact Sensation intact distally Dorsiflexion/Plantar flexion intact No cellulitis present Incision - clean, dry, no drainage, healing Motor Function - intact, moving foot and toes well on exam.   Assessment/Plan: 3 Days Post-Op Procedure(s) (LRB): LEFT TOTAL KNEE ARTHROPLASTY (Left) Procedure(s) (LRB): LEFT TOTAL KNEE ARTHROPLASTY (Left) Past Medical History  Diagnosis Date  . Dysrhythmia     HX OF ATRIAL FIB - CHRONIC COUMADIN  . Hyperlipidemia   . Peripheral vascular disease   . Chronic kidney disease     STAGE 3  . Coronary artery disease     DR. TILLEY IS PT'S CARDIOLGIST  . Myocardial infarction 1994  . Gout     USUALLY IN THE FEET  . BPH (benign prostatic hypertrophy)   . Stroke     NO DEFICITS  . Hypertension   . GERD (gastroesophageal reflux disease)     ROLAIDS IF NEEDED  . 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 Arthritis   . Tooth disease     PT STATES HE IS SCHEDULED TO HAVE BAD TOOTH PULLED 03/24/13 - WILL CHECK WITH DR. Lequita Halt TO MAKE SURE THIS IS OK TO DO BEFORE HIS PLANNED KNEE REPLACEMENT ON 5/5.   Principal Problem:   OA (osteoarthritis) of knee Active Problems:   Postoperative anemia due to acute blood loss   Postop Hyponatremia  Estimated body mass  index is 27.22 kg/(m^2) as calculated from the following:   Height as of this encounter: 5\' 8"  (1.727 m).   Weight as of this encounter: 81.194 kg (179 lb). Advance diet Discharge to SNF later today if doing better Diet - Cardiac diet Follow up - in 2 weeks Activity - WBAT Disposition - Skilled nursing facility - [facility] Condition Upon Discharge - pending D/C Meds - See DC Summary DVT Prophylaxis - Coumadin, Resume ASA 81 mg on discharge  [name], [name] 03/31/2013, 8:18 AM

## 2013-03-31 NOTE — Consult Note (Signed)
PCP:  Ignatius Specking., MD  Cardiology [facility] Physician requesting consult Dr. Ranell Patrick  Reason for consult:  A. fib with RVR  HPI: Stanley Robles is a 79 y.o. male   has a past medical history of Dysrhythmia; Hyperlipidemia; Peripheral vascular disease; Chronic kidney disease; Coronary artery disease; Myocardial infarction (1994); Gout; BPH (benign prostatic hypertrophy); Stroke; Hypertension; GERD (gastroesophageal reflux disease); Cancer; Arthritis; and Tooth disease.   Called by Dr. Ranell Patrick with Orthopedics. Patient has been admitted by Dr. Berton Lan for a left total knee arthroplasty which was done on 03/28/2013. He has known history of atrial fibrillation and is on chronic Coumadin which has been held  for the procedure but now has been restarted.  He was ready for his discharge to nursing home but it was delayed due to constipation.  Today he developed tachycardia which was diagnosis atrial fibrillation with RVR per EKG with heart rates going as high as 160s. CCM has been cold initially and given an order for Cardizem 10 mg IV. At this point his heart rates have come down to 130s he remains tachycardic. Hemodynamically otherwise stable with stable blood pressure. He denies any current chest pain. Of note patient does has history of known coronary artery disease and followed by Dr. Donnie Aho. Extremities unsure when was his last cardiac catheterization. EKG which were obtained while patient was in atrial fibrillation with rapid response that show evidence of ST depression which are new and septum the lateral leads. This was thought to be likely due to demand ischemia. I have spoken to [facility] cardiology on call who is covering currently for Dr. Donnie Aho. They went her situation at this point will recommend Cardizem drip and continuing monitoring in step down. His family he does endorse that he had been constipated for a few days. Also have had slightly worsening cough with some green mucus production no  fevers.  Review of Systems:   Pertinent positives include: constipation excess mucus, productive cough of green mucus  Constitutional:  No weight loss, night sweats, Fevers, chills, fatigue, weight loss  HEENT:  No headaches, Difficulty swallowing,Tooth/dental problems,Sore throat,  No sneezing, itching, ear ache, nasal congestion, post nasal drip,  Cardio-vascular:  No chest pain, Orthopnea, PND, anasarca, dizziness, palpitations.no Bilateral lower extremity swelling  GI:  No heartburn, indigestion, abdominal pain, nausea, vomiting, diarrhea, change in bowel habits, loss of appetite, melena, blood in stool, hematemesis Resp:  no shortness of breath at rest. No dyspnea on exertion, No non-productive cough, No coughing up of blood.No change in color of mucus.No wheezing. Skin:  no rash or lesions. No jaundice GU:  no dysuria, change in color of urine, no urgency or frequency. No straining to urinate.  No flank pain.  Musculoskeletal:  No joint pain or no joint swelling. No decreased range of motion. No back pain.  Psych:  No change in mood or affect. No depression or anxiety. No memory loss.  Neuro: no localizing neurological complaints, no tingling, no weakness, no double vision, no gait abnormality, no slurred speech, no confusion  Otherwise ROS are negative except for above, 10 systems were reviewed  Past Medical History: Past Medical History  Diagnosis Date  . Dysrhythmia     HX OF ATRIAL FIB - CHRONIC COUMADIN  . Hyperlipidemia   . Peripheral vascular disease   . Chronic kidney disease     STAGE 3  . Coronary artery disease     DR. TILLEY IS PT'S CARDIOLGIST  . Myocardial infarction 1994  . Gout  USUALLY IN THE FEET  . BPH (benign prostatic hypertrophy)   . Stroke     NO DEFICITS  . Hypertension   . GERD (gastroesophageal reflux disease)     ROLAIDS IF NEEDED  . 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 Arthritis   . Tooth disease      PT STATES HE IS SCHEDULED TO HAVE BAD TOOTH PULLED 03/24/13 - WILL CHECK WITH DR. Lequita Halt TO MAKE SURE THIS IS OK TO DO BEFORE HIS PLANNED KNEE REPLACEMENT ON 5/5.   Past Surgical History  Procedure Laterality Date  . Mastoid surgery x 5    . Prostate surgery    . Hernia repair  12/26/2002    BILATERAL INGUINAL HERNIA REPAIR  . Cardiac catheterization  07/21/2005  . Total knee arthroplasty Left 03/28/2013    Procedure: LEFT TOTAL KNEE ARTHROPLASTY;  Surgeon: Loanne Drilling, MD;  Location: [facility];  Service: Orthopedics;  Laterality: Left;     Medications: Prior to Admission medications   Medication Sig Start Date End Date Taking? Authorizing Provider  acetaminophen (TYLENOL) 500 MG tablet Take 1,000 mg by mouth every 6 (six) hours as needed for pain.   Yes Historical Provider, MD  amLODipine (NORVASC) 5 MG tablet Take 5 mg by mouth daily before breakfast.   Yes Historical Provider, MD  atenolol (TENORMIN) 25 MG tablet Take 25 mg by mouth daily before breakfast.   Yes Historical Provider, MD  indomethacin (INDOCIN) 50 MG capsule Take 50 mg by mouth daily as needed (for gout flare ups).   Yes Historical Provider, MD  alum & mag hydroxide-simeth (MAALOX/MYLANTA) 200-200-20 MG/5ML suspension Take 30 mLs by mouth every 6 (six) hours as needed. 03/31/13   [name] Julien Girt, PA-C  aspirin EC 81 MG tablet Take 81 mg by mouth daily.    Historical Provider, MD  bisacodyl (DULCOLAX) 10 MG suppository Place 1 suppository (10 mg total) rectally daily as needed. 03/31/13   [name] [name], PA-C  Ca Carbonate-Mag Hydroxide (ROLAIDS PO) Take by mouth. PRN    Historical Provider, MD  diphenhydrAMINE (BENADRYL) 12.5 MG/5ML elixir Take 5-10 mLs (12.5-25 mg total) by mouth every 4 (four) hours as needed for itching. 03/31/13   [name] [name], PA-C  docusate sodium 100 MG CAPS Take 100 mg by mouth 2 (two) times daily. 03/31/13   [name] Julien Girt, PA-C  methocarbamol (ROBAXIN) 500 MG tablet Take 1 tablet (500 mg  total) by mouth every 6 (six) hours as needed. 03/31/13   [name] Julien Girt, PA-C  metoCLOPramide (REGLAN) 5 MG tablet Take 1-2 tablets (5-10 mg total) by mouth every 8 (eight) hours as needed (if ondansetron (ZOFRAN) ineffective.). 03/31/13   [name] Julien Girt, PA-C  nitroGLYCERIN (NITROSTAT) 0.4 MG SL tablet Place 0.4 mg under the tongue every 5 (five) minutes as needed for chest pain.    Historical Provider, MD  ondansetron (ZOFRAN) 4 MG tablet Take 1 tablet (4 mg total) by mouth every 6 (six) hours as needed for nausea. 03/31/13   [name] [name], PA-C  polyethylene glycol (MIRALAX / GLYCOLAX) packet Take 17 g by mouth daily as needed. 03/31/13   [name] [name], PA-C  rosuvastatin (CRESTOR) 20 MG tablet Take 20 mg by mouth every other day.    Historical Provider, MD  tamsulosin (FLOMAX) 0.4 MG CAPS Take 0.4 mg by mouth daily after supper.    Historical Provider, MD  traMADol (ULTRAM) 50 MG tablet Take 1-2 tablets (50-100 mg total) by mouth every  6 (six) hours as needed (mild pain). 03/31/13   [name] Julien Girt, PA-C    Allergies:  No Known Allergies   reports that he has quit smoking. He has never used smokeless tobacco. He reports that he does not drink alcohol or use illicit drugs.   Family History: family history is not on file.    Physical Exam: Patient Vitals for the past 24 hrs:  BP Temp Temp src Pulse Resp SpO2 Height  03/31/13 2305 120/61 mmHg - - 141 21 92 % -  03/31/13 2300 - - - - - - 5\' 8"  (1.727 m)  03/31/13 2222 117/63 mmHg - - 145 18 91 % -  03/31/13 2215 130/52 mmHg - - 165 17 92 % -  03/31/13 2141 101/68 mmHg 98.2 F (36.8 C) Oral 125 20 95 % -  03/31/13 1600 - - - - 18 - -  03/31/13 1355 129/64 mmHg 98.4 F (36.9 C) Oral 98 16 92 % -  03/31/13 1200 - - - - 15 - -  03/31/13 0947 101/56 mmHg 99 F (37.2 C) Oral 82 16 93 % -  03/31/13 0839 - - - - 18 93 % -  03/31/13 0545 98/57 mmHg 99.3 F (37.4 C) Oral 85 20 92 % -  03/31/13 0411 - - - - 16 - -   03/31/13 0038 - - - 70 14 92 % -    1. General:  in No Acute distress 2. Psychological: Alert and Oriented 3. Head/ENT:     Dry Mucous Membranes                          Head Non traumatic, neck supple                          Normal   Dentition 4. SKIN: normal   Skin turgor,  Skin clean Dry and intact no rash 5. Heart: Rapid and irregular  rate and rhythm no Murmur, Rub or gallop 6. Lungs: Clear to auscultation bilaterally, no wheezes or crackles   7. Abdomen: Soft, non-tender, Non distended 8. Lower extremities: no clubbing, cyanosis, or edema 9. Neurologically Grossly intact, moving all 4 extremities equally 10. MSK: Normal range of motion  body mass index is 27.22 kg/(m^2).   Labs on Admission:   Recent Labs  03/29/13 0502 03/30/13 0500  NA 132* 131*  K 4.2 4.5  CL 99 98  CO2 22 26  GLUCOSE 199* 138*  BUN 18 28*  CREATININE 1.51* 1.87*  CALCIUM 8.3* 8.5   No results found for this basename: AST, ALT, ALKPHOS, BILITOT, PROT, ALBUMIN,  in the last 72 hours No results found for this basename: LIPASE, AMYLASE,  in the last 72 hours  Recent Labs  03/30/13 0500 03/31/13 0540  WBC 12.8* 7.2  HGB 10.2* 8.5*  HCT 30.5* 24.7*  MCV 92.4 91.8  PLT 109* 91*   No results found for this basename: CKTOTAL, CKMB, CKMBINDEX, TROPONINI,  in the last 72 hours No results found for this basename: TSH, T4TOTAL, FREET3, T3FREE, THYROIDAB,  in the last 72 hours No results found for this basename: VITAMINB12, FOLATE, FERRITIN, TIBC, IRON, RETICCTPCT,  in the last 72 hours No results found for this basename: HGBA1C    Estimated Creatinine Clearance: 31.5 ml/min (by C-G formula based on Cr of 1.87). ABG No results found for this basename: phart, pco2, po2, hco3, tco2, acidbasedef, o2sat  No results found for this basename: DDIMER     Other results:  I have pearsonaly reviewed this: ECG REPORT  Rate: 139  Rhythm: Atrial fibrillation with RVR ST&T Change: ST depressions  in V2 V3 V4 V5 V6 INR 2.1  Cultures: No results found for this basename: sdes, specrequest, cult, reptstatus       Radiological Exams on Admission: Dg Abd 1 View  03/30/2013  *RADIOLOGY REPORT*  Clinical Data: 79 year old male abdominal distention, pain, nausea.  ABDOMEN - 1 VIEW  Comparison: [facility] CT abdomen and pelvis 01/07/2008.  Findings: Nonobstructed bowel gas pattern.  Chronic degenerative changes in the lumbar spine with mild scoliosis.  Chronic small pelvic phleboliths. No acute osseous abnormality identified.  IMPRESSION: Nonobstructed bowel gas pattern.   Original Report Authenticated By: Erskine Speed, M.D.     Chart has been reviewed  Assessment/recommendation  79 year old gentleman with known history of atrial fibrillation and coronary disease now noted to be in atrial fibrillation with rapid response with  EKG worrisome for demand ischemia.  1. atrial fibrillation with rapid response - will write for Cardizem drip and titrate. While in Cardizem drip will hold atenolol this can be restarted once his heart rate stabilizes. Cardiology is aware. Will come to evaluate the patient in the morning or sooner if decompensates.  Will order echo gram and check TSH. Check for signs of infection with UA and chest x-ray. 2. postoperative anemia - given history of coronary artery disease and abnormal EKG with demand ischemia we'll transfuse 1 unit. 3. abnormal EKG - cardiology is aware. Cycle cardiac enzymes repeat EKG in the morning. Most likely secondary to demand ischemia no chest pain. Patient is therapeutic on Coumadin. 4. constipation continue bowel regimen  Other plan as per orders.  I have spent a total of  65 min on this consult, sinus taken to discuss care of cardiology and orthopedics.  Princess Karnes 03/31/2013, 11:45 PM

## 2013-03-31 NOTE — Progress Notes (Signed)
eLink Physician-Brief Progress Note Patient Name: Urian Martenson DOB: 1932/09/02 MRN: [id number]  Date of Service  03/31/2013   HPI/Events of Note   Contacted by Rapid Response RN:  AF/RVR, unable to reach primary team   eICU Interventions   Cardizem 10 IV x 1   Intervention Category Major Interventions: Arrhythmia - evaluation and management  Yuniel Blaney 03/31/2013, 10:32 PM

## 2013-03-31 NOTE — Progress Notes (Signed)
ANTICOAGULATION CONSULT NOTE - Follow Up Consult  Pharmacy Consult for [facility] Indication: Hx of atrial fibrillation; also s/p L TKA 5/5  No Known Allergies  Patient Measurements: Height: 5\' 8"  (172.7 cm) Weight: 179 lb (81.194 kg) IBW/kg (Calculated) : 68.4  Vital Signs: Temp: 99.3 F (37.4 C) (05/08 0545) Temp src: Oral (05/08 0545) BP: 98/57 mmHg (05/08 0545) Pulse Rate: 85 (05/08 0545)  Labs:  Recent Labs  03/29/13 0502 03/30/13 0500 03/31/13 0540  HGB 12.1* 10.2* 8.5*  HCT 35.4* 30.5* 24.7*  PLT 83* 109* 91*  LABPROT 13.5 16.6* 22.7*  INR 1.04 1.38 2.10*  CREATININE 1.51* 1.87*  --     Estimated Creatinine Clearance: 31.5 ml/min (by C-G formula based on Cr of 1.87).   Medications:  Scheduled:  . amLODipine  5 mg Oral Daily  . atenolol  25 mg Oral Daily  . atorvastatin  40 mg Oral q1800  . [COMPLETED] bisacodyl  10 mg Rectal Once  . docusate sodium  100 mg Oral BID  . metoCLOPramide (REGLAN) injection  10 mg Intravenous Q6H  . tamsulosin  0.4 mg Oral QPC supper  . [COMPLETED] warfarin  3 mg Oral ONCE-1800  . Warfarin - Pharmacist Dosing Inpatient   Does not apply q1800  . [DISCONTINUED] enoxaparin (LOVENOX) injection  30 mg Subcutaneous Q12H   Infusions:  . dextrose 5 % and 0.9% NaCl Stopped (03/30/13 1000)  . lactated ringers 70 mL/hr at 03/30/13 2039  Inpatient warfarin doses: 3mg  (5/5), 3mg  (5/6)  Assessment: 79 y/o M on chronic warfarin for h/o atrial fibrillation, anticoagulation interrupted for L TKA, which was done 03/28/13  Home warfarin dosage 2mg  daily. Also on aspirin 81mg  daily, but currently on hold per ortho.  INR at goal 2.1, with very quick jump to goal after 3 doses of warfarin. Possibly secondary to N/V and decreased PO intake post op.  Plan is to try liquid breakfast and solid lunch today.  Will give conservative dose for tonight   Lovenox 30mg  q12h until INR > or = 1.8 started POD#1 - Pltc low/stable  H/H decreased postop, no  bleeding/complications reported.  Goal of Therapy:  INR 2-3 Monitor platelets by anticoagulation protocol: Yes   Plan:   Warfarin 0.5 mg today at 1800  Discontinue lovenox given INR > 1.8  Daily PT/INR  Arval Brandstetter, Loma Messing PharmD Pager #: [phone] 9:43 AM 03/31/2013

## 2013-03-31 NOTE — Progress Notes (Signed)
Pt c/o "feel lousy"; "haven't had a BM since Monday". Called [place], PA-c and requested pt stay one more day. Will encourage prunes with each meal. May need enema and suppository.

## 2013-04-01 ENCOUNTER — Encounter (HOSPITAL_COMMUNITY): Payer: Self-pay | Admitting: Cardiology

## 2013-04-01 ENCOUNTER — Inpatient Hospital Stay (HOSPITAL_COMMUNITY): Payer: Medicare Other

## 2013-04-01 DIAGNOSIS — E785 Hyperlipidemia, unspecified: Secondary | ICD-10-CM | POA: Insufficient documentation

## 2013-04-01 DIAGNOSIS — I4891 Unspecified atrial fibrillation: Secondary | ICD-10-CM | POA: Diagnosis not present

## 2013-04-01 DIAGNOSIS — Z8673 Personal history of transient ischemic attack (TIA), and cerebral infarction without residual deficits: Secondary | ICD-10-CM | POA: Insufficient documentation

## 2013-04-01 DIAGNOSIS — I480 Paroxysmal atrial fibrillation: Secondary | ICD-10-CM | POA: Diagnosis not present

## 2013-04-01 DIAGNOSIS — R9431 Abnormal electrocardiogram [ECG] [EKG]: Secondary | ICD-10-CM | POA: Diagnosis present

## 2013-04-01 DIAGNOSIS — I2589 Other forms of chronic ischemic heart disease: Secondary | ICD-10-CM

## 2013-04-01 DIAGNOSIS — I2510 Atherosclerotic heart disease of native coronary artery without angina pectoris: Secondary | ICD-10-CM

## 2013-04-01 DIAGNOSIS — Z7901 Long term (current) use of anticoagulants: Secondary | ICD-10-CM

## 2013-04-01 DIAGNOSIS — K5900 Constipation, unspecified: Secondary | ICD-10-CM | POA: Diagnosis present

## 2013-04-01 DIAGNOSIS — I119 Hypertensive heart disease without heart failure: Secondary | ICD-10-CM | POA: Insufficient documentation

## 2013-04-01 LAB — CBC
HCT: 26.2 % — ABNORMAL LOW (ref 39.0–52.0)
MCHC: 35.5 g/dL (ref 30.0–36.0)
Platelets: 139 10*3/uL — ABNORMAL LOW (ref 150–400)
RDW: 15 % (ref 11.5–15.5)
WBC: 7.2 10*3/uL (ref 4.0–10.5)

## 2013-04-01 LAB — PRO B NATRIURETIC PEPTIDE: Pro B Natriuretic peptide (BNP): 5562 pg/mL — ABNORMAL HIGH (ref 0–450)

## 2013-04-01 LAB — BASIC METABOLIC PANEL
BUN: 25 mg/dL — ABNORMAL HIGH (ref 6–23)
Chloride: 102 mEq/L (ref 96–112)
GFR calc Af Amer: 47 mL/min — ABNORMAL LOW (ref >90)
GFR calc non Af Amer: 41 mL/min — ABNORMAL LOW (ref >90)
Potassium: 3.8 mEq/L (ref 3.5–5.1)
Sodium: 136 mEq/L (ref 135–145)

## 2013-04-01 LAB — URINE MICROSCOPIC-ADD ON

## 2013-04-01 LAB — URINALYSIS, ROUTINE W REFLEX MICROSCOPIC
Bilirubin Urine: NEGATIVE
Nitrite: NEGATIVE
Specific Gravity, Urine: 1.015 (ref 1.005–1.030)
Urobilinogen, UA: 1 mg/dL (ref 0.0–1.0)
pH: 6 (ref 5.0–8.0)

## 2013-04-01 LAB — OSMOLALITY, URINE: Osmolality, Ur: 430 mOsm/kg (ref 390–1090)

## 2013-04-01 LAB — SODIUM, URINE, RANDOM: Sodium, Ur: 89 mEq/L

## 2013-04-01 LAB — PROTIME-INR: Prothrombin Time: 25.4 seconds — ABNORMAL HIGH (ref 11.6–15.2)

## 2013-04-01 LAB — TROPONIN I: Troponin I: 8.55 ng/mL (ref <0.30)

## 2013-04-01 LAB — PREPARE RBC (CROSSMATCH)

## 2013-04-01 MED ORDER — ACETAMINOPHEN 325 MG PO TABS
650.0000 mg | ORAL_TABLET | Freq: Once | ORAL | Status: AC
  Administered 2013-04-01: 650 mg via ORAL
  Filled 2013-04-01: qty 2

## 2013-04-01 MED ORDER — ATENOLOL 25 MG PO TABS
25.0000 mg | ORAL_TABLET | Freq: Two times a day (BID) | ORAL | Status: DC
  Administered 2013-04-01 – 2013-04-04 (×7): 25 mg via ORAL
  Filled 2013-04-01 (×8): qty 1

## 2013-04-01 MED ORDER — SODIUM CHLORIDE 0.9 % IV BOLUS (SEPSIS): 250.0000 mL | Freq: Once | INTRAVENOUS | Status: DC

## 2013-04-01 MED ORDER — DIPHENHYDRAMINE HCL 25 MG PO CAPS
25.0000 mg | ORAL_CAPSULE | Freq: Once | ORAL | Status: AC
  Administered 2013-04-01: 25 mg via ORAL
  Filled 2013-04-01: qty 1

## 2013-04-01 MED ORDER — WARFARIN SODIUM 1 MG PO TABS
1.0000 mg | ORAL_TABLET | Freq: Once | ORAL | Status: AC
  Administered 2013-04-01: 1 mg via ORAL
  Filled 2013-04-01: qty 1

## 2013-04-01 MED ORDER — SODIUM CHLORIDE 0.9 % IV SOLN: 500.0000 mL | Freq: Once | INTRAVENOUS | Status: DC

## 2013-04-01 MED ORDER — FUROSEMIDE 10 MG/ML IJ SOLN
40.0000 mg | Freq: Two times a day (BID) | INTRAMUSCULAR | Status: AC
  Administered 2013-04-01 (×2): 40 mg via INTRAVENOUS
  Filled 2013-04-01 (×2): qty 4

## 2013-04-01 MED ORDER — POLYETHYLENE GLYCOL 3350 17 G PO PACK
17.0000 g | PACK | Freq: Two times a day (BID) | ORAL | Status: DC
  Administered 2013-04-01 – 2013-04-04 (×5): 17 g via ORAL
  Filled 2013-04-01 (×8): qty 1

## 2013-04-01 NOTE — Progress Notes (Signed)
ANTICOAGULATION CONSULT NOTE - Follow Up Consult  Pharmacy Consult for Warfarin Indication: Hx of atrial fibrillation; also s/p L TKA 5/5  No Known Allergies  Patient Measurements: Height: 5\' 8"  (172.7 cm) Weight: 179 lb (81.194 kg) IBW/kg (Calculated) : 68.4  Vital Signs: Temp: 98.2 F (36.8 C) (05/09 0800) Temp src: Oral (05/09 0800) BP: 141/33 mmHg (05/09 0900) Pulse Rate: 78 (05/09 0900)  Labs:  Recent Labs  03/30/13 0500 03/31/13 0540 04/01/13 0010 04/01/13 0806  HGB 10.2* 8.5*  --  9.3*  HCT 30.5* 24.7*  --  26.2*  PLT 109* 91*  --  139*  LABPROT 16.6* 22.7*  --  25.4*  INR 1.38 2.10*  --  2.44*  CREATININE 1.87*  --   --  1.57*  TROPONINI  --   --  1.81* 8.53*    Estimated Creatinine Clearance: 37.5 ml/min (by C-G formula based on Cr of 1.57).    Assessment: 79 y/o M on chronic warfarin for h/o atrial fibrillation, anticoagulation interrupted for L TKA, which was done 03/28/13  Home warfarin dosage 2mg  daily.   Elevated troponins. Cardiology c/s recommended no invasive evaluation at this time.  Recommended IV Heparin x 48 hours.  Ortho ok with starting Heparin.  Awaiting final plan from cardiology regarding heparin initiation.  INR therapeutic; however, increased into therapeutic range very quickly following initial 3 mg doses.  Conservative dose ordered for last night, but looks like dose was never administered.  Will still provide a conservative dose tonight considering continued increase in INR.  No drug interactions noted, pt started regular diet 5/8.    Post-op Lovenox discontinued 5/8 given INR>2.  H/H decreased postop, platelets low but relatively stable, no bleeding/complications reported.   Goal of Therapy:  INR 2-3 Monitor platelets by anticoagulation protocol: Yes   Plan:   Warfarin 1 mg today at 1800.  Daily PT/INR.  F/u plan for possible IV Heparin initiation.  Clance Boll, PharmD, BCPS Pager: [phone] 04/01/2013 10:52  AM

## 2013-04-01 NOTE — Consult Note (Signed)
Cardiology Consult Note VYAS,DHRUV B., MD No ref. provider found  Reason for consult: atrial fibrillation with ischemic changes  History of Present Illness (and review of medical records): Stanley Robles is a 79 y.o. male on ortho service admitted for management of joint disease in knee.  He underwent TKA on 5/5 with no reported complications.  [facility] was consulted tonight for onset of atrial fibrillation.  Of concern were his ischemic changes on ecg.  He is followed by Dr. Donnie Aho with known hx of CAD, HTN, HLD, PVD, hx of CVA, and AFib on coumadin.  He has chronic stable angina given known occluded RCA and posterior lateral branch of circumflex.  He was last admitted in 07/2011 for chest pain and Afib with RVR.  He currently remains hemodynamically stable.  He denies any chest pain with onset of Afib.  He currently is without chest pain or shortness of breath.  Review of Systems A comprehensive review of systems was negative other than stated in HPI. Patient Active Problem List   Diagnosis Date Noted  . Atrial fibrillation with RVR 04/01/2013  . Postoperative anemia due to acute blood loss 03/29/2013  . Postop Hyponatremia 03/29/2013  . OA (osteoarthritis) of knee 03/28/2013   Past Medical History  Diagnosis Date  . Dysrhythmia     HX OF ATRIAL FIB - CHRONIC COUMADIN  . Hyperlipidemia   . Peripheral vascular disease   . Chronic kidney disease     STAGE 3  . Coronary artery disease     DR. TILLEY IS PT'S CARDIOLGIST  . Myocardial infarction 1994  . Gout     USUALLY IN THE FEET  . BPH (benign prostatic hypertrophy)   . Stroke     NO DEFICITS  . Hypertension   . GERD (gastroesophageal reflux disease)     ROLAIDS IF NEEDED  . 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 Arthritis   . Tooth disease     PT STATES HE IS SCHEDULED TO HAVE BAD TOOTH PULLED 03/24/13 - WILL CHECK WITH DR. Lequita Halt TO MAKE SURE THIS IS OK TO DO BEFORE HIS PLANNED KNEE  REPLACEMENT ON 5/5.    Past Surgical History  Procedure Laterality Date  . Mastoid surgery x 5    . Prostate surgery    . Hernia repair  12/26/2002    BILATERAL INGUINAL HERNIA REPAIR  . Cardiac catheterization  07/21/2005  . Total knee arthroplasty Left 03/28/2013    Procedure: LEFT TOTAL KNEE ARTHROPLASTY;  Surgeon: Loanne Drilling, MD;  Location: [facility];  Service: Orthopedics;  Laterality: Left;    Prescriptions prior to admission  Medication Sig Dispense Refill  . acetaminophen (TYLENOL) 500 MG tablet Take 1,000 mg by mouth every 6 (six) hours as needed for pain.      Marland Kitchen amLODipine (NORVASC) 5 MG tablet Take 5 mg by mouth daily before breakfast.      . atenolol (TENORMIN) 25 MG tablet Take 25 mg by mouth daily before breakfast.      . indomethacin (INDOCIN) 50 MG capsule Take 50 mg by mouth daily as needed (for gout flare ups).      Marland Kitchen aspirin EC 81 MG tablet Take 81 mg by mouth daily.      . Ca Carbonate-Mag Hydroxide (ROLAIDS PO) Take by mouth. PRN      . nitroGLYCERIN (NITROSTAT) 0.4 MG SL tablet Place 0.4 mg under the tongue every 5 (five) minutes as needed  for chest pain.      . rosuvastatin (CRESTOR) 20 MG tablet Take 20 mg by mouth every other day.      . tamsulosin (FLOMAX) 0.4 MG CAPS Take 0.4 mg by mouth daily after supper.      . [DISCONTINUED] OVER THE COUNTER MEDICATION       . [DISCONTINUED] warfarin (COUMADIN) 4 MG tablet Take 2-4 mg by mouth daily. Takes 1/2 tablet everyday       No Known Allergies  History  Substance Use Topics  . Smoking status: Former Games developer  . Smokeless tobacco: Never Used     Comment: QUIT SMOKING PRIOR TO 1980  . Alcohol Use: No    History reviewed. No pertinent family history.   Objective: Patient Vitals for the past 8 hrs:  BP Temp Temp src Pulse Resp SpO2 Height  03/31/13 2305 120/61 mmHg - - 141 21 92 % -  03/31/13 2300 - - - - - - 5\' 8"  (1.727 m)  03/31/13 2245 109/78 mmHg - - 145 - 93 % -  03/31/13 2222 117/63 mmHg - - 145 18 91 % -   03/31/13 2215 130/52 mmHg - - 165 17 92 % -  03/31/13 2141 101/68 mmHg 98.2 F (36.8 C) Oral 125 20 95 % -   General Appearance:    Alert, cooperative, no distress, elderly appearing male  Head:    Normocephalic, without obvious abnormality,  Eyes:     Anicteric sclerae  Neck:   Supple,   Lungs:     Clear to auscultation bilaterally, respirations unlabored  Heart:    irregular rate and rhythm, S1 and S2 normal,  Abdomen:     Soft, non-tender, normoactive bowel sounds  Extremities:   Left knee s/p surgery  Pulses:   2+ and symmetric all extremities  Skin:   no rashes or lesions  Neurologic:   No focal deficits. AAO x3   Results for orders placed during the hospital encounter of 03/28/13 (from the past 48 hour(s))  CBC     Status: Abnormal   Collection Time    03/30/13  5:00 AM      Result Value Range   WBC 12.8 (*) 4.0 - 10.5 K/uL   RBC 3.30 (*) 4.22 - 5.81 MIL/uL   Hemoglobin 10.2 (*) 13.0 - 17.0 g/dL   HCT 65.7 (*) 84.6 - 96.2 %   MCV 92.4  78.0 - 100.0 fL   MCH 30.9  26.0 - 34.0 pg   MCHC 33.4  30.0 - 36.0 g/dL   RDW 95.2  84.1 - 32.4 %   Platelets 109 (*) 150 - 400 K/uL   Comment: SPECIMEN CHECKED FOR CLOTS     REPEATED TO VERIFY     DELTA CHECK NOTED  BASIC METABOLIC PANEL     Status: Abnormal   Collection Time    03/30/13  5:00 AM      Result Value Range   Sodium 131 (*) 135 - 145 mEq/L   Potassium 4.5  3.5 - 5.1 mEq/L   Chloride 98  96 - 112 mEq/L   CO2 26  19 - 32 mEq/L   Glucose, Bld 138 (*) 70 - 99 mg/dL   BUN 28 (*) 6 - 23 mg/dL   Creatinine, Ser 4.01 (*) 0.50 - 1.35 mg/dL   Calcium 8.5  8.4 - 02.7 mg/dL   GFR calc non Af Amer 33 (*) >90 mL/min   GFR calc Af Amer 38 (*) >90 mL/min  Comment:            The eGFR has been calculated     using the CKD EPI equation.     This calculation has not been     validated in all clinical     situations.     eGFR's persistently     <90 mL/min signify     possible Chronic Kidney Disease.  PROTIME-INR     Status:  Abnormal   Collection Time    03/30/13  5:00 AM      Result Value Range   Prothrombin Time 16.6 (*) 11.6 - 15.2 seconds   INR 1.38  0.00 - 1.49  CBC     Status: Abnormal   Collection Time    03/31/13  5:40 AM      Result Value Range   WBC 7.2  4.0 - 10.5 K/uL   RBC 2.69 (*) 4.22 - 5.81 MIL/uL   Hemoglobin 8.5 (*) 13.0 - 17.0 g/dL   HCT 16.1 (*) 09.6 - 04.5 %   MCV 91.8  78.0 - 100.0 fL   MCH 31.6  26.0 - 34.0 pg   MCHC 34.4  30.0 - 36.0 g/dL   RDW 40.9  81.1 - 91.4 %   Platelets 91 (*) 150 - 400 K/uL   Comment: SPECIMEN CHECKED FOR CLOTS     PLATELET COUNT CONFIRMED BY SMEAR     REPEATED TO VERIFY  PROTIME-INR     Status: Abnormal   Collection Time    03/31/13  5:40 AM      Result Value Range   Prothrombin Time 22.7 (*) 11.6 - 15.2 seconds   INR 2.10 (*) 0.00 - 1.49   Dg Abd 1 View  03/30/2013  *RADIOLOGY REPORT*  Clinical Data: 79 year old male abdominal distention, pain, nausea.  ABDOMEN - 1 VIEW  Comparison: [facility] CT abdomen and pelvis 01/07/2008.  Findings: Nonobstructed bowel gas pattern.  Chronic degenerative changes in the lumbar spine with mild scoliosis.  Chronic small pelvic phleboliths. No acute osseous abnormality identified.  IMPRESSION: Nonobstructed bowel gas pattern.   Original Report Authenticated By: Erskine Speed, M.D.     ECG:  Afib with RVR, HR 139 Marked diffuse ST depression, no recent prior ecgs, no ecgs on this admission prior to onset of afib.  Impression: Atrial Fibrillation Demand Ischemia vs ACS Anemia, likely acute blood loss S/p recent TKA  Recommendations: -No urgent invasive evaluation needed at this time.  Would recommend supportive care. -ASA 325mg , Nitro prn for pain -IV Heparin for 48hrs if ok with Ortho post surgery given trop 1.8. -Rate control with Dilt gtt for Afib. Transition back to his BB when better controlled -Transfuse PRBCs for low HCT -Trend troponins -Monitor on telemetry, ecg prn -Discussed with [facility] and  RN at bedside.  Thank you for this consult.  Will follow along with you.

## 2013-04-01 NOTE — Clinical Social Work Placement (Addendum)
Clinical Social Work Department CLINICAL SOCIAL WORK PLACEMENT NOTE 04/01/2013  Patient:  Stanley Robles, Stanley Robles  Account Number:  [id number] Admit date:  03/28/2013  Clinical Social Worker:  Cori Razor, LCSW  Date/time:  03/29/2013 11:50 AM  Clinical Social Work is seeking post-discharge placement for this patient at the following level of care:   SKILLED NURSING   (*CSW will update this form in Epic as items are completed)     Patient/family provided with [facility] Department of Clinical Social Work's list of facilities offering this level of care within the geographic area requested by the patient (or if unable, by the patient's family).  03/29/2013  Patient/family informed of their freedom to choose among providers that offer the needed level of care, that participate in Medicare, Medicaid or managed care program needed by the patient, have an available bed and are willing to accept the patient.    Patient/family informed of [facility]' ownership interest in [facility], as well as of the fact that they are under no obligation to receive care at this facility.  PASARR submitted to EDS on 03/28/2013 PASARR number received from EDS on 03/28/2013  FL2 transmitted to all facilities in geographic area requested by pt/family on  03/29/2013 FL2 transmitted to all facilities within larger geographic area on   Patient informed that his/her managed care company has contracts with or will negotiate with  certain facilities, including the following:     Patient/family informed of bed offers received:  03/30/2013 Patient chooses bed at [facility] SN[facility] Physician recommends and patient chooses bed at    Patient to be transferred to [facility] SNF  on  04/04/13 Patient to be transferred to facility by [facility]  The following physician request were entered in Epic:   Additional Comments: SNF aware Pt not ready now until next week.  Doreen Salvage, LCSW ICU/Stepdown Clinical  Social Worker [facility] Cell [phone] Hours 8am-1200pm M-F

## 2013-04-01 NOTE — Progress Notes (Signed)
Physical Therapy Treatment Patient Details Name: Brinton Brandel MRN: [id number] DOB: 15-Nov-1932 Today's Date: 04/01/2013 Time: 8469-6295 PT Time Calculation (min): 39 min  PT Assessment / Plan / Recommendation Comments on Treatment Session  POD #4 L TKR post op A Fib transfered to ICU yesterday evening. Pt just completed ECHO cardiogram so assisted OOB to amb then to recliner to perform TKR TE's.  Pt tolerated session well.  C/o 14/10 knee pain with TE's so pain meds requested.  ICE applied after TE's.     Follow Up Recommendations  SNF     Does the patient have the potential to tolerate intense rehabilitation     Barriers to Discharge        Equipment Recommendations  None recommended by PT    Recommendations for Other Services    Frequency 7X/week   Plan Discharge plan remains appropriate;Frequency remains appropriate    Precautions / Restrictions Precautions Precautions: Knee Required Braces or Orthoses: Knee Immobilizer - Left Restrictions Weight Bearing Restrictions: No Other Position/Activity Restrictions: WBAT   Pertinent Vitals/Pain C/o 14/10 Pain meds requested ICE applied    Mobility  Bed Mobility Bed Mobility: Supine to Sit Supine to Sit: 3: Mod assist Details for Bed Mobility Assistance: assisted OOB with support to L LE. Transfers Transfers: Sit to Stand;Stand to Sit Sit to Stand: 1: +2 Total assist;From bed Sit to Stand: Patient Percentage: 70% Stand to Sit: 1: +2 Total assist;To chair/3-in-1 Stand to Sit: Patient Percentage: 70% Details for Transfer Assistance: + 2 assist 2nd multiple lines/leads/O2/IV.  25% VC's on proper hand placement and increased time to center self. Ambulation/Gait Ambulation/Gait Assistance: 1: +2 Total assist Ambulation/Gait: Patient Percentage: 80% Ambulation Distance (Feet): 10 Feet Assistive device: Rolling walker Ambulation/Gait Assistance Details: + 3 assist for cahir/eqipment/IV/O2 amd with 25% VC's on proper walker to  self distance and upright posture.  Amb pt on 3 lts O2 sats avg 88% and HR avg 110.  Pt tolerated amb well.  Gait Pattern: Step-to pattern;Antalgic;Trunk flexed Gait velocity: decreased    Exercises   Total Knee Replacement TE's 10 reps B LE ankle pumps 10 reps knee presses 10 reps heel slides  10 reps SAQ's 10 reps SLR's 10 reps ABD Followed by ICE    PT Goals                                                       progressing    Visit Information  Last PT Received On: 04/01/13 Assistance Needed: +2    Subjective Data   I am not as Grumpy as I was yesterday   Cognition    good   Balance   fair  End of Session PT - End of Session Equipment Utilized During Treatment: Left knee immobilizer Activity Tolerance: Patient limited by fatigue Patient left: in chair;with call bell/phone within reach Nurse Communication: Mobility status (RN assisted by follwing with chair)   Felecia Shelling  PTA [facility]  Acute  Rehab Pager      [phone]

## 2013-04-01 NOTE — Progress Notes (Signed)
Subjective:  Well known to me, had prior CAD with occluded RCA and posterolateral branch and stable angina.  Also history of CVA and prior PAF.  Last admit for PAF was in 2012. Myoview showed inferolateral ischemia but no anterior ischemia preop.  Had elective knee replacement and had rapid afib noted on monitor on post op day 3.  Moved to unit and had +troponin with most recent one of 8.  Converted to NSR this am and has ST depression on EKG. He was asleep when this occurred and had no chest pain or dyspnea.  BNP was up and CXR showed pul edema.  He has been given a dose of Lasix IV.  Currently pain free.  From what I can tell, he was not given his beta blocker after surgery.   Objective:  Vital Signs in the last 24 hours: BP 127/34  Pulse 79  Temp(Src) 98.2 F (36.8 C) (Oral)  Resp 19  Ht 5\' 8"  (1.727 m)  Wt 81.194 kg (179 lb)  BMI 27.22 kg/m2  SpO2 97%  Physical Exam: Pleasant WM in NAD Lungs:  Clear  Cardiac:  Regular rhythm, normal S1 and S2, no S3, 1-2/6 murmur Abdomen:  Soft, nontender, no masses Extremities:  No edema present  Intake/Output from previous day: 05/08 0701 - 05/09 0700 In: 2242.3 [P.O.:360; I.V.:1882.3] Out: 2426 [Urine:2425; Stool:1]  Weight Filed Weights   03/28/13 1430  Weight: 81.194 kg (179 lb)    Lab Results: Basic Metabolic Panel:  Recent Labs  16/10/96 0500 04/01/13 0806  NA 131* 136  K 4.5 3.8  CL 98 102  CO2 26 25  GLUCOSE 138* 132*  BUN 28* 25*  CREATININE 1.87* 1.57*   CBC:  Recent Labs  03/31/13 0540 04/01/13 0806  WBC 7.2 7.2  HGB 8.5* 9.3*  HCT 24.7* 26.2*  MCV 91.8 91.0  PLT 91* 139*   Cardiac Enzymes:  Recent Labs  04/01/13 0010 04/01/13 0806  TROPONINI 1.81* 8.53*    Telemetry: Currently NSR.    ECHO  EF 55%, moderate MR, Mild RV dilation, moderate pulmonary hypertension.  Assessment/Plan:  1. 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  Beta blocker discontinuation may have contributed. 2. A fib resolved 3. Acute CHF due to diastolic dysfunction and rapid afib 4. Stage 3 CKD  Rec:  I would stop IV heparin and restart beta blocker.  Wean diltiazem.  Watch over weekend.  If stable and no more afib, he could go to rehab on Monday.  Check EKG tomorrow.      Darden Palmer  MD [facility] Cardiology  04/01/2013, 1:15 PM

## 2013-04-01 NOTE — Clinical Social Work Note (Signed)
CSW updated daughter, Sedalia Muta that Pt will not be ready for discharge until probably early next week. CSW provided support to daughter as she shared some anxiety about her father's condition. CSW called [facility] SNF and left vm for admissions rep stating the same. CSW will continue to follow and assist with SNF placement.   Doreen Salvage, LCSW ICU/Stepdown Clinical Social Worker [facility] Cell [phone] Hours 8am-1200pm M-F

## 2013-04-01 NOTE — Progress Notes (Signed)
CSW assisting with d/c planning. Pt trans from 6E with plans for rehab at [facility] center . Yesterday's d/c on hold due to medical issues. CSW will continue to follow to assist with d/c planning to SNF when stable.  Cori Razor LCSW [phone]

## 2013-04-01 NOTE — Progress Notes (Signed)
Patient ID: [name], male   DOB: 12-25-1931, 79 y.o.   MRN: [id number] ONE DAY REGADENOSON MYOVIEW  DATE:  01/13/13  PATIENT: Stanley Robles (1932-08-04)  REFERRING:  Dr. Doreen Beam   INDICATIONS: CAD, preop eval, unable to exercise   PROCEDURE: The patient received a resting intravenous injection of 11.9 millicuries of Tc 41m Myoview with SPECT acquisition done after an adequate delay.  He then received an intravenous injection of 0.4 mg of regadenoson over 10 seconds followed after 20 seconds by a repeat intravenous injection of 30.1 millicuries of Tc 66m Myoview with SPECT acquisition done after an adequate delay.  With regadenoson injection the patient had no symptoms.  He tolerated the procedure well. Resting HR 58 bpm.  Peak HR 90 bpm.  Resting BP 152/72.   Peak BP 140/80.  EKG DATA:  12 lead is normal at rest.  No ST changes were seen with Lexiscan  SCINTIGRAPHIC DATA:  The quality of the study is good.  Review of data in cine format shows no significant motion artifact.  No significant attenuation artifact noted.  TID ratio is 1.11.  EDV = 81cc. ESV = 17cc.  Perfusion imaging shows a moderate size moderate intensity reversible defect in the inferolateral wall. The remaining segments are normally perfused. Quantitative gated SPECT analysis shows an ejection fraction of 79% with normal wall motion and normal wall thickening.  IMPRESSION:  1. Abnormal  Lexiscan Cardiolite scan with evidence of inferolateral ischemia 2. Normal quantitative gated SPECT ejection fraction of 79% with normal wall motion and wall thickening.  Recommendations: The patient has ischemia in a distribution corresponding to a prior known occluded artery.  No anterior ischemia is noted.  May proceed with surgery from a cardiac viewpoint.   Darden Palmer. MD  [facility]

## 2013-04-01 NOTE — Progress Notes (Signed)
Orthopedics Progress Note  Subjective: Called to see the patient for rapid HR. Patient 3 days s/p TKR from Dr Despina Hick.  Has not felt well since surgery. Not taking po well per family.  Had very small BM today.  Started into A Fib a few hours ago. Rapid Response team called and cross cover ortho notified. Called [facility] who are at the bedside now.  Cardizem drip started, Patient denies and chest pain or SOB.  Has history of A-Fib(Coumadin) and AMI in the past.  Objective:  Filed Vitals:   03/31/13 2305  BP: 120/61  Pulse: 141  Temp:   Resp: 21    General: Awake and alert  Not well appearing, pale, sweating, rapid pulse at the wrist Neurovascularly intact  Lab Results  Component Value Date   WBC 7.2 03/31/2013   HGB 8.5* 03/31/2013   HCT 24.7* 03/31/2013   MCV 91.8 03/31/2013   PLT 91* 03/31/2013       Component Value Date/Time   NA 131* 03/30/2013 0500   K 4.5 03/30/2013 0500   CL 98 03/30/2013 0500   CO2 26 03/30/2013 0500   GLUCOSE 138* 03/30/2013 0500   BUN 28* 03/30/2013 0500   CREATININE 1.87* 03/30/2013 0500   CALCIUM 8.5 03/30/2013 0500   GFRNONAA 33* 03/30/2013 0500   GFRAA 38* 03/30/2013 0500    Lab Results  Component Value Date   INR 2.10* 03/31/2013   INR 1.38 03/30/2013   INR 1.04 03/29/2013    Assessment/Plan: POD #3 s/p Procedure(s): LEFT TOTAL KNEE ARTHROPLASTY A Fib, Anemia, Dehydration Plan blood transfusion, rehydration, rule out MI with enzymes, CXR pending Cardiology called. Transfer to stepdown. Cardizem drip protocol to control rate. Will notify Dr Despina Hick in the morning  Almedia Balls. Ranell Patrick, MD 04/01/2013 12:20 AM

## 2013-04-01 NOTE — Progress Notes (Signed)
CRITICAL VALUE ALERT  Critical value received:  Troponin 1.81  Date of notification:  04/01/13  Time of notification:  0015  Critical value read back:yes  Nurse who received alert:  cassie   MD notified (1st page):  Dr Adela Glimpse and Dr Terressa Koyanagi  Time of first page:  0020  MD notified (2nd page):  Time of second page:  Responding MD:  Dr Adela Glimpse and Dr Terressa Koyanagi  Time MD responded:  [id number]

## 2013-04-01 NOTE — Progress Notes (Signed)
  Echocardiogram 2D Echocardiogram has been performed.  Stanley Robles 04/01/2013, 10:19 AM

## 2013-04-01 NOTE — Progress Notes (Signed)
Patient ID: Stanley Robles, male   DOB: 1932-03-14, 79 y.o.   MRN: [id number]   Stanley Robles, Stanley Robles  Date of visit:  01/10/2013 DOB:  [birth year]    Age:  79 yrs. Medical record number:  [id number]     Account number:  [id number] Primary Care Provider: Doreen Beam ____________________________ CURRENT DIAGNOSES  1. Pre-Op Cardiovascular Exam  2. CAD,Native  3. Arrhythmia-Atrial Fibrillation  4. Long Term Use Anticoagulant  5. Hyperlipidemia  6. Personal history of TIA or stroke without residua  7. Peripheral Vascular Disease  8. Chronic Kidney Disease (Stage 3) ____________________________ ALLERGIES  Lipitor, Constipation ____________________________ MEDICATIONS  1. amlodipine 5 mg tablet, 1 p.o. daily  2. nitroglycerin 0.4 mg tablet, sublingual, PRN  3. tamsulosin 0.4 mg capsule,extended release 24hr, 1 p.o. daily  4. Aspirin Low-Strength 81 mg tablet, chewable, 1 p.o. daily  5. warfarin 4 mg tablet, Take as directed  6. Crestor 40 mg tablet, 1/2 qod  7. atenolol 25 mg tablet, 1 p.o. q.d. ____________________________ CHIEF COMPLAINTS  To have knee replacement ____________________________ HISTORY OF PRESENT ILLNESS  Patient seen early for preoperative cardiac evaluation. The patient has developed progressive knee pain on the left and thinks that he wants to have a knee operation now. He has a prior history of a stroke and also has intermittent paroxysmal atrial fibrillation. He has been in sinus rhythm. He has had a previous known occlusion of the right coronary artery. He does not have significant angina but his exercise capacity is limited due to his arthritis and his knee pain. It has been a while since his coronary ischemia status was assessed. He does have some chronic kidney disease. He denies PND, orthopnea or claudication. ____________________________ PAST HISTORY  Past Medical Illnesses:  hyperlipidemia, hypertension, peripheral vascular disease, BPH, gout, history of CVA without  deficits;  Cardiovascular Illnesses:  CAD, atrial fibrillation September 2012;  Surgical Procedures:  Mastoid  surgery x 5, right hand surgery, TUNA prostate surgery, eye surgery, Bilateral hernia repair 12/26/02;  Cardiology Procedures-Invasive:  cardiac cath (left) August 2006;  Cardiology Procedures-Noninvasive:  treadmill cardiolite September 1999;  Cardiac Cath Results:  normal Left main, diffuse luminal irregularities LAD, 40% stenosis mid CFX, occluded PLR, small and nondominant RCA, occluded RCA with faint collaterals;   LVEF of 65% documented via cardiac cath on 05/19/2005  CHADS Score:  4  CHA2DS2-VASC Score:  5 ____________________________ CARDIO-PULMONARY TEST DATES EKG Date:  01/10/2013;   Cardiac Cath Date:  07/21/2005;  Nuclear Study Date:  07/26/1996;  Chest Xray Date: 07/21/2005;   ____________________________ SOCIAL HISTORY Alcohol Use:  no alcohol use;  Smoking:  used to smoke but quit Prior to 1980;  Diet:  fat modified diet;  Lifestyle:  widower and 3 girls;  Exercise:  some exercise and exercise is limited due to physical disability;  Occupation:  Environmental health practitioner work;  Residence:  lives alone;   ____________________________ REVIEW OF SYSTEMS General:  malaise and fatigue Integumentary:  easy bruisability  Eyes:  wears eye glasses/contact lenses, cataracts  Ears, Nose, Throat, Mouth:  partial hearing loss, hearing aide right ear  Respiratory:  mild dyspnea with exertion  Cardiovascular:  please review HPI  Genitourinary-Male:  incontinence, nocturia  Musculoskeletal:  arthritis of the knees, generalized arthritis ____________________________ PHYSICAL EXAMINATION VITAL SIGNS  Blood Pressure:  146/70 Sitting, Left arm, regular cuff  , 148/76 Standing, Left arm and regular cuff   Pulse:  60/min. Weight:  184.00 lbs. Height:  67"BMI: 29  Constitutional:  pleasant white male in  no acute distress Skin:  scattered ecchymosis present Head:  normocephalic, balding male hair pattern ENT:   dentition good, hearing aide present right ear Neck:  supple, no masses, thyromegaly, JVD. Carotid pulses are full and equal bilaterally without bruits. Chest:  normal symmetry, clear to auscultation and percussion. Cardiac:  irregular rhythm, normal S1 and S2, No S3 or S4, no murmurs, gallops or rubs detected. Peripheral Pulses:  bilateral femoral bruits present, dorsalis pedis pulses 1+, posterior tibial pulses 1+ Extremities & Back:  bilateral venous insufficiency changes present, 1+ edema Neurological:  no gross motor or sensory deficits noted, affect appropriate, oriented x3. ____________________________ MOST RECENT LIPID PANEL 11/11/12  CHOL TOTL 147 mg/dl, LDL 88 calc, HDL 29 mg/dl, TRIGLYCER 413 mg/dl and CHOL/HDL 5.1 (Calc) ____________________________ IMPRESSIONS/PLAN  1. Coronary artery disease with previous known occlusion of the right coronary artery 2. History of atrial fibrillation currently in sinus rhythm 3. Long-term anticoagulation 4. Previous history of stroke  Recommendations:  It has been over a year since his coronary status was assessed. He is having at least intermediate risk surgery and recommended that he have a LexiScan Cardiolite to be sure he would not be high risk for complications of ischemia from surgery since he has a number of other comorbidities for surgery. EKG shows sinus bradycardia. ____________________________ TODAYS ORDERS  1. 12 Lead EKG: Today  2. Lexiscan 1 day: First Available                       ____________________________ Cardiology Physician:  Darden Palmer MD [facility]

## 2013-04-01 NOTE — Progress Notes (Signed)
Subjective: 4 Days Post-Op Procedure(s) (LRB): LEFT TOTAL KNEE ARTHROPLASTY (Left) Patient reports pain as mild.   Patient seen in rounds for Dr. Lequita Halt.  Events of last night have been reviewed.  He was transferred down to stepdown following onset of A.Fib with RVR.  He was given IV Cardizem and now is on Cardizem drip.  This morning he is sitting up on the bedside commode.  He denies any complaints of SOB, CP, palpitations, etc.  He states that last night he did not experience any of those symptoms during the episode either.  Seen by Rapid Response, then Internal Medicine and Cardiology.  Will hold on therapy until he is stable from their standpoint.  Troponin was noted to be elevated at 1.81.  Serial troponins have been ordered.  HGB up to 9.3 after blood.  Will continue to follow labs.  Plan is for SNF and will likely be some time next week once he has improved and stable for transfer. Patient is sitting on the bedside commode without complaints this morning. Plan is to go Skilled nursing facility after hospital stay.  Objective: Vital signs in last 24 hours: Temp:  [97.8 F (36.6 C)-99 F (37.2 C)] 97.8 F (36.6 C) (05/09 0545) Pulse Rate:  [70-165] 70 (05/09 0700) Resp:  [11-21] 19 (05/09 0700) BP: (95-130)/(37-78) 118/53 mmHg (05/09 0700) SpO2:  [91 %-95 %] 92 % (05/09 0700)  Intake/Output from previous day:  Intake/Output Summary (Last 24 hours) at 04/01/13 0842 Last data filed at 04/01/13 0553  Gross per 24 hour  Intake 1924.5 ml  Output   2051 ml  Net -126.5 ml     Labs:  Recent Labs  03/30/13 0500 03/31/13 0540 04/01/13 0806  HGB 10.2* 8.5* 9.3*    Recent Labs  03/31/13 0540 04/01/13 0806  WBC 7.2 7.2  RBC 2.69* 2.88*  HCT 24.7* 26.2*  PLT 91* 139*    Recent Labs  03/30/13 0500  NA 131*  K 4.5  CL 98  CO2 26  BUN 28*  CREATININE 1.87*  GLUCOSE 138*  CALCIUM 8.5    Recent Labs  03/31/13 0540 04/01/13 0806  INR 2.10* 2.44*     EXAM General - Patient is Alert, Appropriate and Oriented Extremity - Neurovascular intact Sensation intact distally Intact pulses distally No cellulitis present Dressing/Incision - clean, dry, no drainage, healing, knee immobilizer in place. Motor Function - intact, moving foot and toes well on exam.   Past Medical History  Diagnosis Date  . Dysrhythmia     HX OF ATRIAL FIB - CHRONIC COUMADIN  . Hyperlipidemia   . Peripheral vascular disease   . Chronic kidney disease     STAGE 3  . Coronary artery disease     DR. TILLEY IS PT'S CARDIOLGIST  . Myocardial infarction 1994  . Gout     USUALLY IN THE FEET  . BPH (benign prostatic hypertrophy)   . Stroke     NO DEFICITS  . Hypertension   . GERD (gastroesophageal reflux disease)     ROLAIDS IF NEEDED  . 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 Arthritis   . Tooth disease     PT STATES HE IS SCHEDULED TO HAVE BAD TOOTH PULLED 03/24/13 - WILL CHECK WITH DR. Lequita Halt TO MAKE SURE THIS IS OK TO DO BEFORE HIS PLANNED KNEE REPLACEMENT ON 5/5.    Assessment/Plan: 4 Days Post-Op Procedure(s) (LRB): LEFT TOTAL KNEE ARTHROPLASTY (Left)  Principal Problem:   OA (osteoarthritis) of knee Active Problems:   Postoperative anemia due to acute blood loss   Postop Hyponatremia   Atrial fibrillation with RVR   Nonspecific abnormal electrocardiogram (ECG) (EKG)   Unspecified constipation  Estimated body mass index is 27.22 kg/(m^2) as calculated from the following:   Height as of this encounter: 5\' 8"  (1.727 m).   Weight as of this encounter: 81.194 kg (179 lb).  Plan:  Hold therapy for now. Monitor labs. In stepdown for now. INR is 1.81 on coumadin. Heparin per Cardiology.  He is several days out from surgery and the chance of bleeding has decreased and need for Heparin since elevated troponin.   Okay for Heparin from Ortho standpoint.  DVT Prophylaxis - Coumadin Weight-Bearing as tolerated to left  leg  Lennie Dunnigan 04/01/2013, 8:42 AM

## 2013-04-01 NOTE — Progress Notes (Signed)
Physical Therapy Treatment Patient Details Name: Stanley Robles MRN: [id number] DOB: 1932/06/10 Today's Date: 04/01/2013 Time: 7829-5621 PT Time Calculation (min): 17 min  PT Assessment / Plan / Recommendation Comments on Treatment Session  POD # 4 L TKR post op A Fib transfered down to ICU yesterday evening.  Pt on BSC on arrival.  Asssited pt with standing for hygiene then tranfered to recliner.  Positioned with pillows and applied ICE to L knee.  Pt requested to rest alittle beofre attempting anything further.      Follow Up Recommendations  SNF     Does the patient have the potential to tolerate intense rehabilitation     Barriers to Discharge        Equipment Recommendations  None recommended by PT    Recommendations for Other Services    Frequency     Plan Discharge plan remains appropriate;Frequency remains appropriate    Precautions / Restrictions Precautions Precautions: Knee Required Braces or Orthoses: Knee Immobilizer - Left Restrictions Weight Bearing Restrictions: No Other Position/Activity Restrictions: WBAT   Pertinent Vitals/Pain C/o 4/10 L knee pain C/o fatigue    Mobility  Bed Mobility Bed Mobility: Not assessed Details for Bed Mobility Assistance: Pt OOB on BSC on arrival Transfers Transfers: Sit to Stand;Stand to Sit Sit to Stand: 3: Mod assist;4: Min assist;From toilet Stand to Sit: 3: Mod assist;4: Min assist;To chair/3-in-1 Details for Transfer Assistance: verbal cues for hand placement and L LE management and increased time due to multiple lines/leads     PT Goals                                                            progressing    Visit Information  Last PT Received On: 04/01/13 Assistance Needed: +1    Subjective Data      Cognition    good   Balance   fair  End of Session PT - End of Session Equipment Utilized During Treatment: Left knee immobilizer Activity Tolerance: Patient limited by fatigue Patient left: in chair;with  call bell/phone within reach   Felecia Shelling  PTA [facility]  Acute  Rehab Pager      [phone]

## 2013-04-01 NOTE — Progress Notes (Signed)
CARE MANAGEMENT NOTE 04/01/2013  Patient:  Stanley Robles, Stanley Robles   Account Number:  [id number]  Date Initiated:  03/30/2013  Documentation initiated by:  Colleen Can  Subjective/Objective Assessment:   dx total left knee replacemnt     Action/Plan:   SNF rehab   Anticipated DC Date:  04/04/2013   Anticipated DC Plan:  SKILLED NURSING FACILITY  In-house referral  Clinical Social Worker      DC Planning Services  CM consult      Choice offered to / List presented to:             Status of service:  Completed, signed off Medicare Important Message given?  NA - LOS <3 / Initial given by admissions (If response is "NO", the following Medicare IM given date fields will be blank) Date Medicare IM given:   Date Additional Medicare IM given:    Discharge Disposition:    Per UR Regulation:    If discussed at Long Length of Stay Meetings, dates discussed:    Comments:  [id number] Earlene Plater, RN, BSN, CCM:  CHART REVIEWED AND UPDATED. Patient transfer to sdu/icu on pm of [id number] with a.fib and placed on iv cardizem drip.  Next chart review due on [id number]. NO DISCHARGE NEEDS PRESENT AT THIS TIME.  Plan is to go to snf s/p hos may be transferred to [facility] per family wish. CASE MANAGEMENT [phone]

## 2013-04-02 DIAGNOSIS — E876 Hypokalemia: Secondary | ICD-10-CM

## 2013-04-02 DIAGNOSIS — I2510 Atherosclerotic heart disease of native coronary artery without angina pectoris: Secondary | ICD-10-CM

## 2013-04-02 LAB — PRO B NATRIURETIC PEPTIDE: Pro B Natriuretic peptide (BNP): 4701 pg/mL — ABNORMAL HIGH (ref 0–450)

## 2013-04-02 LAB — URINALYSIS, ROUTINE W REFLEX MICROSCOPIC
Bilirubin Urine: NEGATIVE
Protein, ur: NEGATIVE mg/dL
Urobilinogen, UA: 1 mg/dL (ref 0.0–1.0)

## 2013-04-02 LAB — CBC
MCH: 31.4 pg (ref 26.0–34.0)
MCHC: 34.7 g/dL (ref 30.0–36.0)
MCV: 90.6 fL (ref 78.0–100.0)
Platelets: 133 10*3/uL — ABNORMAL LOW (ref 150–400)
RDW: 14.7 % (ref 11.5–15.5)
WBC: 5.7 10*3/uL (ref 4.0–10.5)

## 2013-04-02 LAB — BASIC METABOLIC PANEL
Calcium: 8.4 mg/dL (ref 8.4–10.5)
Creatinine, Ser: 1.59 mg/dL — ABNORMAL HIGH (ref 0.50–1.35)
GFR calc Af Amer: 46 mL/min — ABNORMAL LOW (ref >90)
GFR calc non Af Amer: 40 mL/min — ABNORMAL LOW (ref >90)

## 2013-04-02 LAB — TYPE AND SCREEN: Unit division: 0

## 2013-04-02 MED ORDER — OXYCODONE HCL 5 MG PO TABS
5.0000 mg | ORAL_TABLET | ORAL | Status: DC | PRN
  Administered 2013-04-02 – 2013-04-04 (×6): 5 mg via ORAL
  Filled 2013-04-02 (×6): qty 1

## 2013-04-02 MED ORDER — FUROSEMIDE 10 MG/ML IJ SOLN
60.0000 mg | Freq: Once | INTRAMUSCULAR | Status: AC
  Administered 2013-04-02: 60 mg via INTRAVENOUS
  Filled 2013-04-02: qty 6

## 2013-04-02 MED ORDER — POTASSIUM CHLORIDE CRYS ER 20 MEQ PO TBCR
40.0000 meq | EXTENDED_RELEASE_TABLET | Freq: Two times a day (BID) | ORAL | Status: AC
  Administered 2013-04-02 (×2): 40 meq via ORAL
  Filled 2013-04-02 (×2): qty 1
  Filled 2013-04-02: qty 2

## 2013-04-02 MED ORDER — TAMSULOSIN HCL 0.4 MG PO CAPS
0.8000 mg | ORAL_CAPSULE | Freq: Every day | ORAL | Status: DC
  Administered 2013-04-02 – 2013-04-03 (×2): 0.8 mg via ORAL
  Filled 2013-04-02 (×3): qty 2

## 2013-04-02 MED ORDER — FLEET ENEMA 7-19 GM/118ML RE ENEM: 1.0000 | ENEMA | Freq: Every day | RECTAL | Status: DC | PRN

## 2013-04-02 MED ORDER — SODIUM CHLORIDE 0.9 % IJ SOLN
3.0000 mL | Freq: Two times a day (BID) | INTRAMUSCULAR | Status: DC
  Administered 2013-04-02 – 2013-04-04 (×3): 3 mL via INTRAVENOUS

## 2013-04-02 MED ORDER — WARFARIN SODIUM 2 MG PO TABS
2.0000 mg | ORAL_TABLET | Freq: Once | ORAL | Status: AC
  Administered 2013-04-02: 2 mg via ORAL
  Filled 2013-04-02: qty 1

## 2013-04-02 MED ORDER — SODIUM CHLORIDE 0.9 % IJ SOLN
3.0000 mL | INTRAMUSCULAR | Status: DC | PRN
  Administered 2013-04-03: 3 mL via INTRAVENOUS

## 2013-04-02 MED ORDER — SODIUM CHLORIDE 0.9 % IV SOLN: 250.0000 mL | INTRAVENOUS | Status: DC | PRN

## 2013-04-02 NOTE — Progress Notes (Signed)
Subjective:  Patient feels well this am. Maintaining NSR. No chest pain or dyspnea. EKG yesterday showed improved ST segments.  Objective:  Vital Signs in the last 24 hours: Temp:  [97.8 F (36.6 C)-98.9 F (37.2 C)] 98.9 F (37.2 C) (05/10 0800) Pulse Rate:  [74-94] 94 (05/10 0800) Resp:  [16-25] 25 (05/10 0800) BP: (103-186)/(32-92) 145/60 mmHg (05/10 0800) SpO2:  [90 %-97 %] 96 % (05/10 0800)  Intake/Output from previous day: 05/09 0701 - 05/10 0700 In: 553.1 [I.V.:553.1] Out: 3075 [Urine:3075] Intake/Output from this shift: Total I/O In: 260 [P.O.:240; I.V.:20] Out: 50 [Urine:50]  . atenolol  25 mg Oral BID  . atorvastatin  40 mg Oral q1800  . docusate sodium  100 mg Oral BID  . furosemide  60 mg Intravenous Once  . metoCLOPramide (REGLAN) injection  10 mg Intravenous Q6H  . polyethylene glycol  17 g Oral BID  . potassium chloride  40 mEq Oral BID  . sodium chloride  250 mL Intravenous Once  . tamsulosin  0.4 mg Oral QPC supper  . warfarin  2 mg Oral ONCE-1800  . Warfarin - Pharmacist Dosing Inpatient   Does not apply q1800   . dextrose 5 % and 0.9% NaCl 10 mL/hr at 04/01/13 1000  . diltiazem (CARDIZEM) infusion Stopped (04/01/13 1600)  . lactated ringers 70 mL/hr (03/31/13 2355)    Physical Exam: The patient appears to be in no distress.  Head and neck exam reveals that the pupils are equal and reactive.  The extraocular movements are full.  There is no scleral icterus.  Mouth and pharynx are benign.  No lymphadenopathy.  No carotid bruits.  The jugular venous pressure is normal.  Thyroid is not enlarged or tender.  Chest is clear to percussion and auscultation.  No rales or rhonchi.  Expansion of the chest is symmetrical.  Heart reveals no abnormal lift or heave.  First and second heart sounds are normal.  There is no murmur gallop rub or click.  The abdomen is soft and nontender.  Bowel sounds are normoactive.  There is no hepatosplenomegaly or mass.   There are no abdominal bruits.  Extremities reveal mild edema left leg. Neurologic exam alert, oriented.  Integument reveals no rash  Lab Results:  Recent Labs  04/01/13 0806 04/02/13 0348  WBC 7.2 5.7  HGB 9.3* 9.4*  PLT 139* 133*    Recent Labs  04/01/13 0806 04/02/13 0348  NA 136 135  K 3.8 3.2*  CL 102 97  CO2 25 29  GLUCOSE 132* 114*  BUN 25* 24*  CREATININE 1.57* 1.59*    Recent Labs  04/01/13 1639 04/02/13 0813  TROPONINI 8.55* 7.83*   Hepatic Function Panel No results found for this basename: PROT, ALBUMIN, AST, ALT, ALKPHOS, BILITOT, BILIDIR, IBILI,  in the last 72 hours No results found for this basename: CHOL,  in the last 72 hours No results found for this basename: PROTIME,  in the last 72 hours  Imaging: Imaging results have been reviewed  Cardiac Studies: Telemetry shows NSR Assessment/Plan:  1. 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 Beta blocker discontinuation may have contributed.  2. A fib resolved  3. Acute CHF due to diastolic dysfunction and rapid afib  4. Stage 3 CKD  Renal function stable  Okay to transfer to telemetry today.    LOS: 5 days    Cassell Clement 04/02/2013, 9:21 AM

## 2013-04-02 NOTE — Progress Notes (Signed)
[facility] PROGRESS NOTE  Stanley Robles ZOX:[id number] DOB: Nov 24, 1932 DOA: 03/28/2013  PCP: Ignatius Specking., MD  Brief HPI: Stanley Robles is a 79 y.o. male who has a past medical history of Dysrhythmia; Hyperlipidemia; Peripheral vascular disease; Chronic kidney disease; Coronary artery disease; Myocardial infarction (1994); Gout; BPH (benign prostatic hypertrophy); Stroke; Hypertension; GERD (gastroesophageal reflux disease); Cancer; Arthritis; and Tooth disease. [facility] was called by Dr. Ranell Patrick with Orthopedics. Patient had been admitted by Dr. Berton Lan for a left total knee arthroplasty which was done on 03/28/2013. He has known history of atrial fibrillation and is on chronic Coumadin which had been held for the procedure but now has been restarted. He was ready for his discharge to nursing home but it was delayed due to constipation. On the night of 5/8 he developed tachycardia which was diagnosed as atrial fibrillation with RVR per EKG with heart rates going as high as 160s. CCM was called initially and gave an order for Cardizem 10 mg IV. Subsequently his heart rate came down to 130s. Hemodynamically he remained stable with stable blood pressure. He denied any current chest pain. Of note patient does has history of known coronary artery disease and followed by Dr. Donnie Aho. EKG which were obtained while patient was in atrial fibrillation with rapid response showed evidence for ST depression which was new in the lateral leads. This was thought to be likely due to demand ischemia. Patient converted to Sr on 5/9. He was noted to have pulm edema on CXR. He was given Lasix.   Past medical history:  Past Medical History  Diagnosis Date  . Hyperlipidemia   . Peripheral vascular disease   . Coronary artery disease     DR. TILLEY IS PT'S CARDIOLGIST  . Myocardial infarction 1994  . Gout     USUALLY IN THE FEET  . BPH (benign prostatic hypertrophy)   . GERD (gastroesophageal reflux disease)     ROLAIDS  IF NEEDED  . 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 Tooth disease     PT STATES HE IS SCHEDULED TO HAVE BAD TOOTH PULLED 03/24/13 - WILL CHECK WITH DR. Lequita Halt TO MAKE SURE THIS IS OK TO DO BEFORE HIS PLANNED KNEE REPLACEMENT ON 5/5.  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 Chronic kidney disease stage 3   . Hypertensive heart disease   . Paroxysmal atrial fibrillation 04/01/2013  . Osteoarthritis     Consultants: Dr. Donnie Aho  Procedures: Left TKR 5/5  Antibiotics: None  Subjective: Patient sitting on chair. Feels better. Denies any chest pain or difficulty breathing. Pain in left knee is well controlled at rest.   Objective: Vital Signs  Filed Vitals:   04/02/13 0346 04/02/13 0400 04/02/13 0500 04/02/13 0600  BP:  141/45 134/42 136/39  Pulse:  82 86 85  Temp:  97.9 F (36.6 C)    TempSrc:  Oral    Resp: 16 17 18 16   Height:      Weight:      SpO2: 96% 94% 95% 95%    Intake/Output Summary (Last 24 hours) at 04/02/13 0752 Last data filed at 04/02/13 0600  Gross per 24 hour  Intake 543.08 ml  Output   3075 ml  Net -2531.92 ml   Filed Weights   03/28/13 1430  Weight: 81.194 kg (179 lb)    General appearance: alert, cooperative, appears stated age and no distress  Back: symmetric, no curvature. ROM normal. No CVA tenderness. Resp: Crackles bilateral bases. No wheezing. Cardio: regular rate and rhythm, S1, S2 normal, no murmur, click, rub or gallop GI: soft, non-tender; bowel sounds normal; no masses,  no organomegaly Extremities: extremities normal, atraumatic, no cyanosis or edema Pulses: 2+ and symmetric Skin: Skin color, texture, turgor normal. No rashes or lesions Lymph nodes: Cervical, supraclavicular, and axillary nodes normal. Neurologic: Alert and oriented X 3, normal strength and tone. No focal deficits.  Lab Results:  Basic Metabolic Panel:  Recent Labs Lab  03/29/13 0502 03/30/13 0500 04/01/13 0806 04/02/13 0348  NA 132* 131* 136 135  K 4.2 4.5 3.8 3.2*  CL 99 98 102 97  CO2 22 26 25 29   GLUCOSE 199* 138* 132* 114*  BUN 18 28* 25* 24*  CREATININE 1.51* 1.87* 1.57* 1.59*  CALCIUM 8.3* 8.5 8.4 8.4   CBC:  Recent Labs Lab 03/29/13 0502 03/30/13 0500 03/31/13 0540 04/01/13 0806 04/02/13 0348  WBC 8.2 12.8* 7.2 7.2 5.7  HGB 12.1* 10.2* 8.5* 9.3* 9.4*  HCT 35.4* 30.5* 24.7* 26.2* 27.1*  MCV 92.7 92.4 91.8 91.0 90.6  PLT 83* 109* 91* 139* 133*   Cardiac Enzymes:  Recent Labs Lab 04/01/13 0010 04/01/13 0806 04/01/13 1639  TROPONINI 1.81* 8.53* 8.55*   BNP (last 3 results)  Recent Labs  04/01/13 0806 04/02/13 0348  PROBNP 5562.0* 4701.0*    Studies/Results: Dg Chest Port 1 View  04/01/2013  *RADIOLOGY REPORT*  Clinical Data: Shortness of breath.  PORTABLE CHEST - 1 VIEW  Comparison: 03/21/2013 for  Findings: Cardiomegaly.  Pulmonary edema.  Right-sided pleural effusion is suspected.  Calcified aorta.  IMPRESSION: Interval development of pulmonary edema.  Please see above.   Original Report Authenticated By: Lacy Duverney, M.D.     Medications:  Scheduled: . atenolol  25 mg Oral BID  . atorvastatin  40 mg Oral q1800  . docusate sodium  100 mg Oral BID  . furosemide  60 mg Intravenous Once  . metoCLOPramide (REGLAN) injection  10 mg Intravenous Q6H  . polyethylene glycol  17 g Oral BID  . potassium chloride  40 mEq Oral BID  . sodium chloride  250 mL Intravenous Once  . tamsulosin  0.4 mg Oral QPC supper  . Warfarin - Pharmacist Dosing Inpatient   Does not apply q1800   Continuous: . dextrose 5 % and 0.9% NaCl 10 mL/hr at 04/01/13 1000  . diltiazem (CARDIZEM) infusion Stopped (04/01/13 1600)  . lactated ringers 70 mL/hr (03/31/13 2355)   ZOX:[id number], acetaminophen, alum & mag hydroxide-simeth, bisacodyl, calcium carbonate, diphenhydrAMINE, menthol-cetylpyridinium, methocarbamol (ROBAXIN) IV, methocarbamol,  metoCLOPramide (REGLAN) injection, metoCLOPramide, nitroGLYCERIN, ondansetron (ZOFRAN) IV, ondansetron, phenol, traMADol  Assessment/Plan:  Principal Problem:   OA (osteoarthritis) of knee Active Problems:   Postoperative anemia due to acute blood loss   Postop Hyponatremia   Atrial fibrillation with RVR   Nonspecific abnormal electrocardiogram (ECG) (EKG)   Unspecified constipation   CAD (coronary artery disease)   Paroxysmal atrial fibrillation   Long-term (current) use of anticoagulants    Paroxysmal Atrial fibrillation Remains in SR. Started back on Atenolol yesterday by cards. Off cardizem infusion. Trop seems to be stabilizing. Will repeat today. ECHO report reviewed and shows normal EF with no wall motion abnormalities. Elevated trop likely secondary to demand ischemia. Repeat EKG shows subtle ST changes but not as pronounced as before. TSH is normal.   Pulmonary Edema Due to afib. Normal EF on ECHO. Diuresed well with Lasix.  Will give another dose today. Replete K. Repeat CXR in AM.  Abnormal EKG with elevated Troponin in setting of known CAD Most likely due to tachyarrythmia. On Warfarin. Heparin was contemplated by cardiology but not initiated. No further intervention planned by cardiology which seems reasonable especially as ECHO doesn't show concerning findings.   Postoperative anemia HGb is stable. Given history of coronary artery disease and abnormal EKG with demand ischemia he was transfused 1 unit of PRBC on 5/8-9.   Recent Left TKR Stable. Management per Ortho  Constipation Changed Miralax to scheduled. Continue stool softeners. TSH is normal. May need suppositories or enema.  Code Status: Full Code DVT Prophylaxis:   Warfarin Family Communication: Discussed with patient  Disposition Plan: If trop is trending down he can be transferred to [facility]. Plan for SNF on Monday if he remains stable and adequately diuresed.   LOS: 5 days   [facility]  [facility] Pager [phone] 04/02/2013, 7:52 AM  If 8PM-8AM, please contact night-coverage at [email or web address], password [facility]

## 2013-04-02 NOTE — Progress Notes (Signed)
Physical Therapy Treatment Patient Details Name: Stanley Robles MRN: [id number] DOB: [birth year] Today's Date: 04/02/2013 Time: 1308-6578 PT Time Calculation (min): 18 min  PT Assessment / Plan / Recommendation Comments on Treatment Session  Pt with ischemic demand on heart with elevated troponins, however they are trending down and cardiology has cleared to transfer to telemetry.  Able to ambulate some in hallway, however is limited by fatigue.     Follow Up Recommendations  SNF     Does the patient have the potential to tolerate intense rehabilitation     Barriers to Discharge        Equipment Recommendations  None recommended by PT    Recommendations for Other Services    Frequency 7X/week   Plan Discharge plan remains appropriate;Frequency remains appropriate    Precautions / Restrictions Precautions Precautions: Knee Required Braces or Orthoses: Knee Immobilizer - Left Knee Immobilizer - Left: Discontinue once straight leg raise with < 10 degree lag Restrictions Weight Bearing Restrictions: No Other Position/Activity Restrictions: WBAT   Pertinent Vitals/Pain 4/10 pain    Mobility  Bed Mobility Bed Mobility: Sit to Supine Sit to Supine: 4: Min assist Details for Bed Mobility Assistance: Assist for LLE into bed with cues for adjusting hips once in bed.  Transfers Transfers: Sit to Stand;Stand to Sit Sit to Stand: 1: +2 Total assist;From chair/3-in-1;With armrests Sit to Stand: Patient Percentage: 80% Stand to Sit: 1: +2 Total assist;With upper extremity assist;To bed Stand to Sit: Patient Percentage: 70% Details for Transfer Assistance: Assist to steady and ensure controlled descent with Max cues for hand placement, LE management and safety as pt attempted to stand without PT being ready.  Ambulation/Gait Ambulation/Gait Assistance: 1: +2 Total assist Ambulation/Gait: Patient Percentage: 70% Ambulation Distance (Feet): 25 Feet Assistive device: Rolling  walker Ambulation/Gait Assistance Details: MAX cues for sequencing/technique with RW and to maintain upright posture throughout.  Gait Pattern: Step-to pattern;Antalgic;Trunk flexed Gait velocity: decreased    Exercises     PT Diagnosis:    PT Problem List:   PT Treatment Interventions:     PT Goals Acute Rehab PT Goals PT Goal Formulation: With patient Time For Goal Achievement: 04/05/13 Potential to Achieve Goals: Good Pt will go Sit to Supine/Side: with supervision PT Goal: Sit to Supine/Side - Progress: Progressing toward goal Pt will go Sit to Stand: with supervision PT Goal: Sit to Stand - Progress: Progressing toward goal Pt will go Stand to Sit: with supervision PT Goal: Stand to Sit - Progress: Progressing toward goal Pt will Ambulate: 51 - 150 feet;with min assist PT Goal: Ambulate - Progress: Progressing toward goal  Visit Information  Last PT Received On: 04/02/13 Assistance Needed: +2    Subjective Data  Subjective: I'll go walking   Cognition  Cognition Arousal/Alertness: Awake/alert Behavior During Therapy: WFL for tasks assessed/performed Overall Cognitive Status: Within Functional Limits for tasks assessed    Balance     End of Session PT - End of Session Equipment Utilized During Treatment: Left knee immobilizer Activity Tolerance: Patient limited by fatigue Patient left: in bed;with call bell/phone within reach;with family/visitor present Nurse Communication: Mobility status   GP     Vista Deck 04/02/2013, 1:16 PM

## 2013-04-02 NOTE — Progress Notes (Signed)
Patient transferring to room 1409.  Report called to [place], Charity fundraiser.  Patient to travel by bed.  Will continue to monitor.

## 2013-04-02 NOTE — Progress Notes (Signed)
Subjective: 5 Days Post-Op Procedure(s) (LRB): LEFT TOTAL KNEE ARTHROPLASTY (Left) Patient reports pain as mild.   Patient seen in rounds with Dr. Lequita Halt.  He is off the IV Cardizem.  Continued therapy.  Monitor over the weekend. Patient is well, and has had no acute complaints or problems Plan is to go Skilled nursing facility after hospital stay.  Objective: Vital signs in last 24 hours: Temp:  [97.8 F (36.6 C)-98.9 F (37.2 C)] 98.9 F (37.2 C) (05/10 0800) Pulse Rate:  [74-86] 85 (05/10 0600) Resp:  [16-22] 16 (05/10 0600) BP: (103-186)/(32-92) 136/39 mmHg (05/10 0600) SpO2:  [90 %-97 %] 95 % (05/10 0600)  Intake/Output from previous day:  Intake/Output Summary (Last 24 hours) at 04/02/13 0837 Last data filed at 04/02/13 0600  Gross per 24 hour  Intake    310 ml  Output   3075 ml  Net  -2765 ml    Intake/Output this shift:    Labs:  Recent Labs  03/31/13 0540 04/01/13 0806 04/02/13 0348  HGB 8.5* 9.3* 9.4*    Recent Labs  04/01/13 0806 04/02/13 0348  WBC 7.2 5.7  RBC 2.88* 2.99*  HCT 26.2* 27.1*  PLT 139* 133*    Recent Labs  04/01/13 0806 04/02/13 0348  NA 136 135  K 3.8 3.2*  CL 102 97  CO2 25 29  BUN 25* 24*  CREATININE 1.57* 1.59*  GLUCOSE 132* 114*  CALCIUM 8.4 8.4    Recent Labs  04/01/13 0806 04/02/13 0348  INR 2.44* 2.14*    EXAM General - Patient is Alert, Appropriate and Oriented Extremity - Neurovascular intact Sensation intact distally Dorsiflexion/Plantar flexion intact No cellulitis present Dressing/Incision - clean, dry, no drainage, healing Motor Function - intact, moving foot and toes well on exam.   Past Medical History  Diagnosis Date  . Hyperlipidemia   . Peripheral vascular disease   . Coronary artery disease     DR. TILLEY IS PT'S CARDIOLGIST  . Myocardial infarction 1994  . Gout     USUALLY IN THE FEET  . BPH (benign prostatic hypertrophy)   . GERD (gastroesophageal reflux disease)     ROLAIDS  IF NEEDED  . 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 Tooth disease     PT STATES HE IS SCHEDULED TO HAVE BAD TOOTH PULLED 03/24/13 - WILL CHECK WITH DR. Lequita Halt TO MAKE SURE THIS IS OK TO DO BEFORE HIS PLANNED KNEE REPLACEMENT ON 5/5.  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 Chronic kidney disease stage 3   . Hypertensive heart disease   . Paroxysmal atrial fibrillation 04/01/2013  . Osteoarthritis     Assessment/Plan: 5 Days Post-Op Procedure(s) (LRB): LEFT TOTAL KNEE ARTHROPLASTY (Left) Principal Problem:   OA (osteoarthritis) of knee Active Problems:   Postoperative anemia due to acute blood loss   Postop Hyponatremia   Atrial fibrillation with RVR   Nonspecific abnormal electrocardiogram (ECG) (EKG)   Unspecified constipation   CAD (coronary artery disease)   Paroxysmal atrial fibrillation   Long-term (current) use of anticoagulants   Postop Hypokalemia  Estimated body mass index is 27.22 kg/(m^2) as calculated from the following:   Height as of this encounter: 5\' 8"  (1.727 m).   Weight as of this encounter: 81.194 kg (179 lb). Up with therapy  DVT Prophylaxis - Coumadin Weight-Bearing as tolerated to left leg  [name], [name] 04/02/2013,  8:37 AM

## 2013-04-02 NOTE — Progress Notes (Signed)
ANTICOAGULATION CONSULT NOTE - Follow Up Consult  Pharmacy Consult for Warfarin Indication: Hx of atrial fibrillation; also s/p L TKA 5/5  No Known Allergies  Patient Measurements: Height: 5\' 8"  (172.7 cm) Weight: 179 lb (81.194 kg) IBW/kg (Calculated) : 68.4  Vital Signs: Temp: 98.9 F (37.2 C) (05/10 0800) Temp src: Oral (05/10 0800) BP: 145/60 mmHg (05/10 0800) Pulse Rate: 94 (05/10 0800)  Labs:  Recent Labs  03/31/13 0540  04/01/13 0806 04/01/13 1639 04/02/13 0348 04/02/13 0813  HGB 8.5*  --  9.3*  --  9.4*  --   HCT 24.7*  --  26.2*  --  27.1*  --   PLT 91*  --  139*  --  133*  --   LABPROT 22.7*  --  25.4*  --  23.0*  --   INR 2.10*  --  2.44*  --  2.14*  --   CREATININE  --   --  1.57*  --  1.59*  --   TROPONINI  --   < > 8.53* 8.55*  --  7.83*  < > = values in this interval not displayed.  Estimated Creatinine Clearance: 37 ml/min (by C-G formula based on Cr of 1.59).    Assessment: 79 y/o M on chronic warfarin for h/o atrial fibrillation, anticoagulation interrupted for L TKA, which was done 03/28/13  Home warfarin dosage 2mg  daily.   Elevated troponins. Cardiology c/s recommended no invasive evaluation at this time.  NSTEMI 2/2 demand ischemia and rapid afib which beta blocker discontinuation may have contributed - atenolol resumed.  INR therapeutic.  Being conservative with doses considering the quick increase following initial 3 mg doses.    No drug interactions noted, pt started regular diet 5/8.    Post-op Lovenox discontinued 5/8 given INR>2.  H/H decreased postop, platelets low but relatively stable, no bleeding/complications reported.   Goal of Therapy:  INR 2-3 Monitor platelets by anticoagulation protocol: Yes   Plan:   Warfarin 2 mg today at 1800.  Daily PT/INR.  Clance Boll, PharmD, BCPS Pager: [phone] 04/02/2013 8:57 AM

## 2013-04-02 NOTE — Progress Notes (Signed)
Nurse Notified.

## 2013-04-03 ENCOUNTER — Inpatient Hospital Stay (HOSPITAL_COMMUNITY): Payer: Medicare Other

## 2013-04-03 DIAGNOSIS — I4891 Unspecified atrial fibrillation: Secondary | ICD-10-CM

## 2013-04-03 DIAGNOSIS — J810 Acute pulmonary edema: Secondary | ICD-10-CM

## 2013-04-03 DIAGNOSIS — N400 Enlarged prostate without lower urinary tract symptoms: Secondary | ICD-10-CM

## 2013-04-03 DIAGNOSIS — R339 Retention of urine, unspecified: Secondary | ICD-10-CM

## 2013-04-03 LAB — CBC
HCT: 28.3 % — ABNORMAL LOW (ref 39.0–52.0)
Hemoglobin: 9.5 g/dL — ABNORMAL LOW (ref 13.0–17.0)
MCH: 30.9 pg (ref 26.0–34.0)
MCHC: 33.6 g/dL (ref 30.0–36.0)
RBC: 3.07 MIL/uL — ABNORMAL LOW (ref 4.22–5.81)

## 2013-04-03 LAB — URINE CULTURE

## 2013-04-03 LAB — BASIC METABOLIC PANEL
BUN: 25 mg/dL — ABNORMAL HIGH (ref 6–23)
Chloride: 99 mEq/L (ref 96–112)
GFR calc Af Amer: 44 mL/min — ABNORMAL LOW (ref >90)
Glucose, Bld: 104 mg/dL — ABNORMAL HIGH (ref 70–99)
Potassium: 3.7 mEq/L (ref 3.5–5.1)
Sodium: 137 mEq/L (ref 135–145)

## 2013-04-03 MED ORDER — WARFARIN SODIUM 2.5 MG PO TABS
2.5000 mg | ORAL_TABLET | Freq: Once | ORAL | Status: AC
  Administered 2013-04-03: 2.5 mg via ORAL
  Filled 2013-04-03: qty 1

## 2013-04-03 MED ORDER — FUROSEMIDE 40 MG PO TABS
40.0000 mg | ORAL_TABLET | Freq: Every day | ORAL | Status: DC
  Administered 2013-04-03 – 2013-04-04 (×2): 40 mg via ORAL
  Filled 2013-04-03 (×2): qty 1

## 2013-04-03 MED ORDER — POTASSIUM CHLORIDE CRYS ER 20 MEQ PO TBCR
40.0000 meq | EXTENDED_RELEASE_TABLET | Freq: Every day | ORAL | Status: AC
  Administered 2013-04-03 – 2013-04-04 (×2): 40 meq via ORAL
  Filled 2013-04-03 (×2): qty 2

## 2013-04-03 MED ORDER — POLYVINYL ALCOHOL 1.4 % OP SOLN
2.0000 [drp] | OPHTHALMIC | Status: DC | PRN
  Filled 2013-04-03: qty 15

## 2013-04-03 NOTE — Progress Notes (Signed)
Physical Therapy Treatment Patient Details Name: Stanley Robles MRN: [id number] DOB: 1932-02-09 Today's Date: 04/03/2013 Time: 7829-5621 PT Time Calculation (min): 38 min  PT Assessment / Plan / Recommendation Comments on Treatment Session  POD # 6 L TKR with post op A Fib.  Pt OOB in recliner with daughter in room.  Monitored RA sats. Amb in hallway twice then performed TE's.  Pt plans to D/C to [facility] for [facility] Rehab. RA at rest prior to session 90% RA during gait avg 97% RA after session 100%   Follow Up Recommendations  SNF     Does the patient have the potential to tolerate intense rehabilitation     Barriers to Discharge        Equipment Recommendations  None recommended by PT    Recommendations for Other Services    Frequency 7X/week   Plan Discharge plan remains appropriate;Frequency remains appropriate    Precautions / Restrictions Precautions Precautions: Knee Precaution Comments: Instructed pt on KI use and proper application Required Braces or Orthoses: Knee Immobilizer - Left Knee Immobilizer - Left: Discontinue once straight leg raise with < 10 degree lag Restrictions Weight Bearing Restrictions: No Other Position/Activity Restrictions: WBAT   Pertinent Vitals/Pain C/o "some" knee pain with act ICE applied    Mobility  Bed Mobility Bed Mobility: Not assessed Details for Bed Mobility Assistance: Pt OOB in recliner Transfers Transfers: Sit to Stand;Stand to Sit Sit to Stand: 1: +2 Total assist;From chair/3-in-1;With armrests Sit to Stand: Patient Percentage: 80% Stand to Sit: 1: +2 Total assist;With upper extremity assist;To bed Stand to Sit: Patient Percentage: 70% Details for Transfer Assistance: 50% VC's on proper tech and hanplacement along with cueing to conrtol decend. Ambulation/Gait Ambulation/Gait Assistance: 1: +2 Total assist Ambulation Distance (Feet): 65 Feet (40' then 25') Assistive device: Rolling walker Ambulation/Gait Assistance  Details: 75% VC's on proper ealker to self distance and to avoid over stepping past the front of the walker. Amb on RA sats avg 97%. Gait Pattern: Step-to pattern;Antalgic;Trunk flexed Gait velocity: decreased    Exercises   Total Knee Replacement TE's 10 reps B LE ankle pumps 10 reps knee presses 10 reps heel slides  10 reps SAQ's 10 reps SLR's 10 reps ABD Followed by ICE   PT Goals                                                    progressing    Visit Information  Last PT Received On: 04/03/13 Assistance Needed: +1    Subjective Data  Subjective: I'm trying out every floor in the hospital   Cognition    good   Balance   fair  End of Session PT - End of Session Equipment Utilized During Treatment: Left knee immobilizer Activity Tolerance: Patient limited by fatigue Patient left: in chair;with call bell/phone within reach;with family/visitor present   Felecia Shelling  PTA [facility]  Acute  Rehab Pager      [phone]

## 2013-04-03 NOTE — Progress Notes (Signed)
ANTICOAGULATION CONSULT NOTE - Follow Up Consult  Pharmacy Consult for Warfarin Indication: Hx of atrial fibrillation; also s/p L TKA 5/5  No Known Allergies  Patient Measurements: Height: 5\' 8"  (172.7 cm) Weight:  (unable to determine, bed has equipment and not zeroed) IBW/kg (Calculated) : 68.4  Vital Signs: Temp: 98.5 F (36.9 C) (05/11 0456) Temp src: Oral (05/11 0456) BP: 112/49 mmHg (05/11 0456) Pulse Rate: 81 (05/11 0456)  Labs:  Recent Labs  04/01/13 0806 04/01/13 1639 04/02/13 0348 04/02/13 0813 04/03/13 0500  HGB 9.3*  --  9.4*  --  9.5*  HCT 26.2*  --  27.1*  --  28.3*  PLT 139*  --  133*  --  149*  LABPROT 25.4*  --  23.0*  --  19.3*  INR 2.44*  --  2.14*  --  1.69*  CREATININE 1.57*  --  1.59*  --  1.67*  TROPONINI 8.53* 8.55*  --  7.83*  --     Estimated Creatinine Clearance: 35.3 ml/min (by C-G formula based on Cr of 1.67).    Assessment: 79 y/o M on chronic warfarin for h/o atrial fibrillation, anticoagulation interrupted for L TKA, which was done 03/28/13  Home warfarin dosage 2mg  daily.   Elevated troponins. Cardiology c/s recommended no invasive evaluation at this time.  NSTEMI 2/2 demand ischemia and rapid afib which beta blocker discontinuation may have contributed - atenolol resumed.  INR now subtherapeutic (1.69), possibly from missed dose 5/8?   No drug interactions noted, pt started regular diet 5/8.    Post-op Lovenox discontinued 5/8 given INR>2 yesterday.  H/H decreased postop, platelets low but relatively stable, no bleeding/complications reported.   Goal of Therapy:  INR 2-3 Monitor platelets by anticoagulation protocol: Yes   Plan:   Warfarin 2.5mg  today  Daily PT/INR.  Defer need to resume lovenox to MD, but expect INR to rise   Loralee Pacas, PharmD, BCPS Pager: [phone]  04/03/2013 7:48 AM

## 2013-04-03 NOTE — Progress Notes (Signed)
Pt oxygen saturation 90% on RA at rest. While ambulating pt oxygen satuation increased to 97% RA. Once back at rest oxygen saturation 94% RA.

## 2013-04-03 NOTE — Progress Notes (Signed)
   Subjective: 6 Days Post-Op Procedure(s) (LRB): LEFT TOTAL KNEE ARTHROPLASTY (Left)  Pt states he is doing fairly well Pain is mild Still having some urinary retention  Patient reports pain as mild.  Objective:   VITALS:   Filed Vitals:   04/03/13 0456  BP: 112/49  Pulse: 81  Temp: 98.5 F (36.9 C)  Resp: 20    Left knee incision healing well nv intact distally No rashes or edema  LABS  Recent Labs  04/01/13 0806 04/02/13 0348 04/03/13 0500  HGB 9.3* 9.4* 9.5*  HCT 26.2* 27.1* 28.3*  WBC 7.2 5.7 6.2  PLT 139* 133* 149*     Recent Labs  04/01/13 0806 04/02/13 0348 04/03/13 0500  NA 136 135 137  K 3.8 3.2* 3.7  BUN 25* 24* 25*  CREATININE 1.57* 1.59* 1.67*  GLUCOSE 132* 114* 104*     Assessment/Plan: 6 Days Post-Op Procedure(s) (LRB): LEFT TOTAL KNEE ARTHROPLASTY (Left) Continue current treatment PT/OT as able D/c planning  Up with therapy   Alphonsa Overall, MPAS, PA-C  04/03/2013, 7:43 AM

## 2013-04-03 NOTE — Progress Notes (Signed)
5/10-5/11/14(7p-7am shift)- pt was up in chair earlier & condom cath leaked, reapplied, pt keeps c/o not being able to urinate, even though 150cc in drainage bag, & I/O shows 650 urine output since 5am this am on 04/02/13. Bladder scanned pt due to bladder distension, could only get a reading of 98cc from scanner, because the pt could not tolerate pressure from the scanner over his distended bladder. NP-Mary Lynch contacted to obtain an order for I&O cath. Cath done to obtain 825cc clear amber urine. Pressure & discomfort relieved, pt was very grateful & appreciative, because he said he had dealt with this pain all day, but denied any pain on my assessment tonight except with his left knee-surgical site pain. The pt was started on Flomax 04/02/13, & hx of BPH. Will con't to monitor for con't problems urinating

## 2013-04-03 NOTE — Progress Notes (Signed)
[facility] PROGRESS NOTE  Stanley Robles ZOX:[id number] DOB: [birth year] DOA: 03/28/2013  PCP: Ignatius Specking., MD  Brief HPI: Stanley Robles is a 79 y.o. male who has a past medical history of Dysrhythmia; Hyperlipidemia; Peripheral vascular disease; Chronic kidney disease; Coronary artery disease; Myocardial infarction (1994); Gout; BPH (benign prostatic hypertrophy); Stroke; Hypertension; GERD (gastroesophageal reflux disease); Cancer; Arthritis; and Tooth disease. [facility] was called by Dr. Ranell Patrick with Orthopedics. Patient had been admitted by Dr. Berton Lan for a left total knee arthroplasty which was done on 03/28/2013. He has known history of atrial fibrillation and is on chronic Coumadin which had been held for the procedure but now has been restarted. He was ready for his discharge to nursing home but it was delayed due to constipation. On the night of 5/8 he developed tachycardia which was diagnosed as atrial fibrillation with RVR per EKG with heart rates going as high as 160s. CCM was called initially and gave an order for Cardizem 10 mg IV. Subsequently his heart rate came down to 130s. Hemodynamically he remained stable with stable blood pressure. He denied any current chest pain. Of note patient does has history of known coronary artery disease and followed by Dr. Donnie Aho. EKG which were obtained while patient was in atrial fibrillation with rapid response showed evidence for ST depression which was new in the lateral leads. This was thought to be likely due to demand ischemia. Patient converted to Sr on 5/9. He was noted to have pulm edema on CXR. He was given Lasix.   Past medical history:  Past Medical History  Diagnosis Date  . Hyperlipidemia   . Peripheral vascular disease   . Coronary artery disease     DR. TILLEY IS PT'S CARDIOLGIST  . Myocardial infarction 1994  . Gout     USUALLY IN THE FEET  . BPH (benign prostatic hypertrophy)   . GERD (gastroesophageal reflux disease)     ROLAIDS  IF NEEDED  . 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 Tooth disease     PT STATES HE IS SCHEDULED TO HAVE BAD TOOTH PULLED 03/24/13 - WILL CHECK WITH DR. Lequita Halt TO MAKE SURE THIS IS OK TO DO BEFORE HIS PLANNED KNEE REPLACEMENT ON 5/5.  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 Chronic kidney disease stage 3   . Hypertensive heart disease   . Paroxysmal atrial fibrillation 04/01/2013  . Osteoarthritis     Consultants: Dr. Donnie Aho  Procedures: Left TKR 5/5  Antibiotics: None  Subjective: Patient feels well. Overnight events noted. Patient had urinary retention last night. Has not passed urine today yet. Has condom cath. No chest pain or shortness of breath. Minimal knee pain. Had 2 BM's yesterday.  Objective: Vital Signs  Filed Vitals:   04/03/13 0000 04/03/13 0327 04/03/13 0456 04/03/13 0800  BP:   112/49   Pulse:   81   Temp:   98.5 F (36.9 C)   TempSrc:   Oral   Resp: 20 18 20 20   Height:      Weight:      SpO2:   94% 95%    Intake/Output Summary (Last 24 hours) at 04/03/13 0853 Last data filed at 04/03/13 0821  Gross per 24 hour  Intake   1110 ml  Output   2075 ml  Net   -965 ml   Filed Weights   03/28/13 1430  Weight: 81.194  kg (179 lb)    General appearance: alert, cooperative, appears stated age and no distress Back: symmetric, no curvature. ROM normal. No CVA tenderness. Resp: Decreased air entry at bases with few crackles. Improved from last 2 days. No wheezing. Cardio: regular rate and rhythm, S1, S2 normal, no murmur, click, rub or gallop GI: soft, tender in suprapubic area. bowel sounds normal; no masses,  no organomegaly Extremities: Left knee in brace.  Neurologic: Alert and oriented X 3, normal strength and tone. No focal deficits.  Lab Results:  Basic Metabolic Panel:  Recent Labs Lab 03/29/13 0502 03/30/13 0500 04/01/13 0806 04/02/13 0348  04/03/13 0500  NA 132* 131* 136 135 137  K 4.2 4.5 3.8 3.2* 3.7  CL 99 98 102 97 99  CO2 22 26 25 29 30   GLUCOSE 199* 138* 132* 114* 104*  BUN 18 28* 25* 24* 25*  CREATININE 1.51* 1.87* 1.57* 1.59* 1.67*  CALCIUM 8.3* 8.5 8.4 8.4 8.7   CBC:  Recent Labs Lab 03/30/13 0500 03/31/13 0540 04/01/13 0806 04/02/13 0348 04/03/13 0500  WBC 12.8* 7.2 7.2 5.7 6.2  HGB 10.2* 8.5* 9.3* 9.4* 9.5*  HCT 30.5* 24.7* 26.2* 27.1* 28.3*  MCV 92.4 91.8 91.0 90.6 92.2  PLT 109* 91* 139* 133* 149*   Cardiac Enzymes:  Recent Labs Lab 04/01/13 0010 04/01/13 0806 04/01/13 1639 04/02/13 0813 04/03/13 0500  TROPONINI 1.81* 8.53* 8.55* 7.83* 4.31*   BNP (last 3 results)  Recent Labs  04/01/13 0806 04/02/13 0348  PROBNP 5562.0* 4701.0*    Studies/Results: Dg Chest Port 1 View  04/03/2013  *RADIOLOGY REPORT*  Clinical Data: History of pulmonary edema.  PORTABLE CHEST - 1 VIEW  Comparison: Chest 04/01/2013.  Findings: Pulmonary edema has resolved.  Trace right pleural effusion is noted.  No left pleural effusion is seen.  No focal airspace disease.  IMPRESSION: Resolved pulmonary edema and basilar atelectasis.  Trace right pleural effusion noted.   Original Report Authenticated By: Holley Dexter, M.D.     Medications:  Scheduled: . atenolol  25 mg Oral BID  . atorvastatin  40 mg Oral q1800  . docusate sodium  100 mg Oral BID  . polyethylene glycol  17 g Oral BID  . sodium chloride  3 mL Intravenous Q12H  . tamsulosin  0.8 mg Oral QPC supper  . warfarin  2.5 mg Oral ONCE-1800  . Warfarin - Pharmacist Dosing Inpatient   Does not apply q1800   Continuous:   ZOX:WRUEAV chloride, acetaminophen, acetaminophen, alum & mag hydroxide-simeth, bisacodyl, calcium carbonate, diphenhydrAMINE, menthol-cetylpyridinium, methocarbamol (ROBAXIN) IV, methocarbamol, metoCLOPramide (REGLAN) injection, metoCLOPramide, nitroGLYCERIN, ondansetron (ZOFRAN) IV, ondansetron, oxyCODONE, phenol, sodium  chloride, sodium phosphate, traMADol  Assessment/Plan:  Principal Problem:   OA (osteoarthritis) of knee Active Problems:   Postoperative anemia due to acute blood loss   Postop Hyponatremia   Atrial fibrillation with RVR   Nonspecific abnormal electrocardiogram (ECG) (EKG)   Unspecified constipation   CAD (coronary artery disease)   Paroxysmal atrial fibrillation   Long-term (current) use of anticoagulants   Postop Hypokalemia    Paroxysmal Atrial fibrillation Remains in SR. Is back on Atenolol. Off cardizem infusion. Trop is decreasing. ECHO report reviewed and shows normal EF with no wall motion abnormalities. Elevated trop likely secondary to demand ischemia. Repeat EKG shows subtle ST changes but not as pronounced as before. TSH is normal.   Pulmonary Edema Due to afib. Normal EF on ECHO. Diuresed well with Lasix. Will give oral dose for few more days. Repeat  CXR shows improvement in edema.  Abnormal EKG with elevated Troponin in setting of known CAD Most likely due to tachyarrythmia. On Warfarin. No further intervention planned by cardiology which seems reasonable especially as ECHO doesn't show concerning findings.   Urinary retention Possibly from BPH and relative immobility. Bladder scan revealed greater than of urine. Will place foley. Will need to be discharged with Foley. His urologist is Dr. Earlene Plater. Dose of Flomax was increased yesterday. Hopefully, as he becomes more ambulatory this issue will resolve. UA does not show infection.  Postoperative anemia HGb is stable. Given history of coronary artery disease and abnormal EKG with demand ischemia he was transfused 1 unit of PRBC on 5/8-9.   Recent Left TKR Stable. Management per Ortho  Constipation Changed Miralax to scheduled. Continue stool softeners. TSH is normal. May need suppositories or enema.  Code Status: Full Code DVT Prophylaxis:   Warfarin. INR subtherapeutic today. No need for Lovenox. Family  Communication: Discussed with patient  Disposition Plan: Possible discharge 5/12 to SNF.   LOS: 6 days   [facility]  [facility] Pager [phone] 04/03/2013, 8:53 AM  If 8PM-8AM, please contact night-coverage at [email or web address], password [facility]

## 2013-04-03 NOTE — Progress Notes (Signed)
   Subjective:  Patient feels well this am. Maintaining NSR. No chest pain or dyspnea. Problems with bladder outlet obstruction yesterday relieved by Foley catheter.  Objective:  Vital Signs in the last 24 hours: Temp:  [98.1 F (36.7 C)-98.9 F (37.2 C)] 98.5 F (36.9 C) (05/11 0456) Pulse Rate:  [81-94] 81 (05/11 0456) Resp:  [16-25] 20 (05/11 0456) BP: (101-155)/(44-68) 112/49 mmHg (05/11 0456) SpO2:  [85 %-100 %] 94 % (05/11 0456)  Intake/Output from previous day: 05/10 0701 - 05/11 0700 In: 1120 [P.O.:1080; I.V.:40] Out: 2125 [Urine:2125] Intake/Output from this shift:    . atenolol  25 mg Oral BID  . atorvastatin  40 mg Oral q1800  . docusate sodium  100 mg Oral BID  . polyethylene glycol  17 g Oral BID  . sodium chloride  3 mL Intravenous Q12H  . tamsulosin  0.8 mg Oral QPC supper  . Warfarin - Pharmacist Dosing Inpatient   Does not apply q1800      Physical Exam: The patient appears to be in no distress.  Head and neck exam reveals that the pupils are equal and reactive.  The extraocular movements are full.  There is no scleral icterus.  Mouth and pharynx are benign.  No lymphadenopathy.  No carotid bruits.  The jugular venous pressure is normal.  Thyroid is not enlarged or tender.  Chest is clear to percussion and auscultation.  No rales or rhonchi.  Expansion of the chest is symmetrical.  Heart reveals no abnormal lift or heave.  First and second heart sounds are normal.  There is no murmur gallop rub or click.  The abdomen is soft and nontender.  Bowel sounds are normoactive.  There is no hepatosplenomegaly or mass.  There are no abdominal bruits.  Extremities reveal mild edema left leg. Neurologic exam alert, oriented.  Integument reveals no rash  Lab Results:  Recent Labs  04/02/13 0348 04/03/13 0500  WBC 5.7 6.2  HGB 9.4* 9.5*  PLT 133* 149*    Recent Labs  04/01/13 0806 04/02/13 0348  NA 136 135  K 3.8 3.2*  CL 102 97  CO2 25 29    GLUCOSE 132* 114*  BUN 25* 24*  CREATININE 1.57* 1.59*    Recent Labs  04/01/13 1639 04/02/13 0813  TROPONINI 8.55* 7.83*   Hepatic Function Panel No results found for this basename: PROT, ALBUMIN, AST, ALT, ALKPHOS, BILITOT, BILIDIR, IBILI,  in the last 72 hours No results found for this basename: CHOL,  in the last 72 hours No results found for this basename: PROTIME,  in the last 72 hours  Imaging: Imaging results have been reviewed  Cardiac Studies: Telemetry shows NSR EKG pending. Assessment/Plan:  1. 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 Beta blocker discontinuation may have contributed.  2. A fib resolved  3. Acute CHF due to diastolic dysfunction and rapid afib. Now back in NSR  4. Stage 3 CKD  Renal function stable  Okay to increase activity per ortho protocol.   LOS: 6 days    Cassell Clement 04/03/2013, 7:25 AM

## 2013-04-03 NOTE — Plan of Care (Signed)
Problem: Consults Goal: Diagnosis- Total Joint Replacement Outcome: Progressing Pt going to rehab facility at time of discharge  Problem: Discharge Progression Outcomes Goal: Complications resolved/controlled Outcome: Completed/Met Date Met:  04/03/13 To see Dr Ferne Reus Davis-urologist for follow up R/T urinary retention, & foley cath use(Pt to be discharged with foley intact until he see Dr R. Earlene Plater)

## 2013-04-03 NOTE — Progress Notes (Signed)
Physical Therapy Treatment Patient Details Name: Stanley Robles MRN: [id number] DOB: Feb 21, 1932 Today's Date: 04/03/2013 Time: 5366-4403 PT Time Calculation (min): 25 min  PT Assessment / Plan / Recommendation Comments on Treatment Session  POD # 6 L TKR pm session.  Amb in hallway second time then assisted back to bed for the CPM.    Follow Up Recommendations  SNF     Does the patient have the potential to tolerate intense rehabilitation     Barriers to Discharge        Equipment Recommendations  None recommended by PT    Recommendations for Other Services    Frequency 7X/week   Plan Discharge plan remains appropriate;Frequency remains appropriate    Precautions / Restrictions Precautions Precautions: Knee Precaution Comments: Instructed pt on KI use and proper application Required Braces or Orthoses: Knee Immobilizer - Left Knee Immobilizer - Left: Discontinue once straight leg raise with < 10 degree lag Restrictions Weight Bearing Restrictions: No Other Position/Activity Restrictions: WBAT   Pertinent Vitals/Pain C/o "soreness"    Mobility  Bed Mobility Bed Mobility: Sit to Supine Sit to Supine: 4: Min assist Details for Bed Mobility Assistance: Assisted pt back to bed with min assist to support L LE Transfers Transfers: Sit to Stand;Stand to Sit Sit to Stand: 1: +2 Total assist;From chair/3-in-1;With armrests Sit to Stand: Patient Percentage: 80% Stand to Sit: 1: +2 Total assist;With upper extremity assist;To bed Stand to Sit: Patient Percentage: 70% Details for Transfer Assistance: 50% VC's on proper tech and hanplacement along with cueing to conrtol decend. Ambulation/Gait Ambulation/Gait Assistance: 1: +2 Total assist Ambulation/Gait: Patient Percentage: 80% Ambulation Distance (Feet): 74 Feet Assistive device: Rolling walker Ambulation/Gait Assistance Details: 50% VC's on proper sequencing and L LE placejment in middle vs front of RW.  Still unsteady  gait. Gait Pattern: Step-to pattern;Antalgic;Trunk flexed Gait velocity: decreased     PT Goals                                                          progressing     Visit Information  Last PT Received On: 04/03/13 Assistance Needed: +1    Subjective Data  Subjective: I'm trying out every floor in the hospital   Cognition    good   Balance   poor  End of Session PT - End of Session Equipment Utilized During Treatment: Left knee immobilizer Activity Tolerance: Patient limited by fatigue Patient left: in bed;with call bell/phone within reach;with family/visitor present   Felecia Shelling  PTA [facility]  Acute  Rehab Pager      [phone]

## 2013-04-04 DIAGNOSIS — I214 Non-ST elevation (NSTEMI) myocardial infarction: Secondary | ICD-10-CM

## 2013-04-04 DIAGNOSIS — B952 Enterococcus as the cause of diseases classified elsewhere: Secondary | ICD-10-CM

## 2013-04-04 DIAGNOSIS — I509 Heart failure, unspecified: Secondary | ICD-10-CM

## 2013-04-04 LAB — BASIC METABOLIC PANEL
BUN: 29 mg/dL — ABNORMAL HIGH (ref 6–23)
Calcium: 8.4 mg/dL (ref 8.4–10.5)
GFR calc non Af Amer: 34 mL/min — ABNORMAL LOW (ref >90)
Glucose, Bld: 110 mg/dL — ABNORMAL HIGH (ref 70–99)
Sodium: 135 mEq/L (ref 135–145)

## 2013-04-04 LAB — PROTIME-INR: Prothrombin Time: 19.7 seconds — ABNORMAL HIGH (ref 11.6–15.2)

## 2013-04-04 MED ORDER — AMOXICILLIN-POT CLAVULANATE 500-125 MG PO TABS: 1.0000 | ORAL_TABLET | Freq: Two times a day (BID) | ORAL | Status: DC

## 2013-04-04 MED ORDER — POLYETHYLENE GLYCOL 3350 17 G PO PACK: 17.0000 g | PACK | Freq: Every day | ORAL | Status: DC

## 2013-04-04 MED ORDER — ATENOLOL 25 MG PO TABS: 25.0000 mg | ORAL_TABLET | Freq: Two times a day (BID) | ORAL | Status: DC

## 2013-04-04 MED ORDER — AMOXICILLIN-POT CLAVULANATE 500-125 MG PO TABS
1.0000 | ORAL_TABLET | Freq: Two times a day (BID) | ORAL | Status: DC
  Administered 2013-04-04: 500 mg via ORAL
  Filled 2013-04-04 (×2): qty 1

## 2013-04-04 MED ORDER — FUROSEMIDE 20 MG PO TABS: ORAL_TABLET | ORAL | Status: DC

## 2013-04-04 MED ORDER — TAMSULOSIN HCL 0.4 MG PO CAPS: 0.8000 mg | ORAL_CAPSULE | Freq: Every day | ORAL | Status: DC

## 2013-04-04 MED ORDER — POLYVINYL ALCOHOL 1.4 % OP SOLN: 2.0000 [drp] | OPHTHALMIC | Status: DC | PRN

## 2013-04-04 MED ORDER — FLEET ENEMA 7-19 GM/118ML RE ENEM: 1.0000 | ENEMA | Freq: Every day | RECTAL | Status: DC | PRN

## 2013-04-04 MED ORDER — WARFARIN SODIUM 2.5 MG PO TABS: 2.5000 mg | ORAL_TABLET | Freq: Every day | ORAL | Status: DC

## 2013-04-04 NOTE — Progress Notes (Signed)
Occupational Therapy Treatment Patient Details Name: Stanley Robles MRN: [id number] DOB: [birth year] Today's Date: 04/04/2013 Time: 4098-1191 OT Time Calculation (min): 10 min  OT Assessment / Plan / Recommendation Comments on Treatment Session      Follow Up Recommendations  SNF;Supervision/Assistance - 24 hour       Equipment Recommendations  3 in 1 bedside comode       Frequency Min 2X/week   Plan Discharge plan remains appropriate    Precautions / Restrictions Restrictions Weight Bearing Restrictions: No       ADL  Toilet Transfer Method: Sit to stand;Other (comment) (performed 3 times focusing on safety and hand placement) Transfers/Ambulation Related to ADLs: Education provided regarding donning LLE knee immobilizer.        OT Goals ADL Goals ADL Goal: Toilet Transfer - Progress: Progressing toward goals  Visit Information  Last OT Received On: 04/04/13    Subjective Data  Subjective: I need to know how to fix this brace      Cognition  Cognition Arousal/Alertness: Awake/alert Behavior During Therapy: WFL for tasks assessed/performed Overall Cognitive Status: Within Functional Limits for tasks assessed    Mobility  Transfers Transfers: Sit to Stand;Stand to Sit Sit to Stand: 4: Min guard;From chair/3-in-1;With upper extremity assist Stand to Sit: 4: Min guard;To chair/3-in-1;With upper extremity assist          End of Session OT - End of Session Equipment Utilized During Treatment: Left knee immobilizer Activity Tolerance: Patient tolerated treatment well Patient left: in chair;with call bell/phone within reach;with family/visitor present CPM Left Knee CPM Left Knee: Off CPM Right Knee CPM Right Knee: Off  GO     [name] 04/04/2013, 2:15 PM

## 2013-04-04 NOTE — Discharge Summary (Signed)
. Physician Discharge Summary   Patient ID: Stanley Robles MRN: [id number] DOB/AGE: [birth year] 79 y.o.  Admit date: 03/28/2013 Discharge date: 04/04/2013  Primary Diagnosis: Osteoarthritis Left knee  Admission Diagnoses:  Past Medical History  Diagnosis Date  . Hyperlipidemia   . Peripheral vascular disease   . Coronary artery disease     DR. TILLEY IS PT'S CARDIOLGIST  . Myocardial infarction 1994  . Gout     USUALLY IN THE FEET  . BPH (benign prostatic hypertrophy)   . GERD (gastroesophageal reflux disease)     ROLAIDS IF NEEDED  . 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 Tooth disease     PT STATES HE IS SCHEDULED TO HAVE BAD TOOTH PULLED 03/24/13 - WILL CHECK WITH DR. Lequita Halt TO MAKE SURE THIS IS OK TO DO BEFORE HIS PLANNED KNEE REPLACEMENT ON 5/5.  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 Chronic kidney disease stage 3   . Hypertensive heart disease   . Paroxysmal atrial fibrillation 04/01/2013  . Osteoarthritis    Discharge Diagnoses:   Principal Problem:   OA (osteoarthritis) of knee Active Problems:   Postoperative anemia due to acute blood loss   Postop Hyponatremia   Atrial fibrillation with RVR   Nonspecific abnormal electrocardiogram (ECG) (EKG)   Unspecified constipation   CAD (coronary artery disease)   Paroxysmal atrial fibrillation   Long-term (current) use of anticoagulants   Postop Hypokalemia   Urinary retention   Non-STEMI (non-ST elevated myocardial infarction)   Acute CHF (congestive heart failure)   Enterococcus UTI  Estimated body mass index is 26.55 kg/(m^2) as calculated from the following:   Height as of this encounter: 5\' 8"  (1.727 m).   Weight as of this encounter: 79.2 kg (174 lb 9.7 oz).  Procedure:  Procedure(s) (LRB): LEFT TOTAL KNEE ARTHROPLASTY (Left)   Consults: cardiology and medicine  HPI: Osteoarthritis Left knee  Laboratory  Data: Admission on 03/28/2013  No results displayed because visit has over 200 results.    Hospital Outpatient Visit on 03/21/2013  Component Date Value Range Status  . MRSA, PCR 03/21/2013 NEGATIVE  NEGATIVE Final  . Staphylococcus aureus 03/21/2013 NEGATIVE  NEGATIVE Final   Comment:                                 The Xpert SA Assay (FDA                          approved for NASAL specimens                          in patients over 67 years of age),                          is one component of                          a comprehensive surveillance                          program.  Test performance has  been validated by [facility] for patients greater                          than or equal to 4 year old.                          It is not intended                          to diagnose infection nor to                          guide or monitor treatment.  Marland Kitchen aPTT 03/21/2013 35  24 - 37 seconds Final  . WBC 03/21/2013 4.7  4.0 - 10.5 K/uL Final  . RBC 03/21/2013 4.86  4.22 - 5.81 MIL/uL Final  . Hemoglobin 03/21/2013 15.8  13.0 - 17.0 g/dL Final  . HCT 16/08/9603 45.7  39.0 - 52.0 % Final  . MCV 03/21/2013 94.0  78.0 - 100.0 fL Final  . MCH 03/21/2013 32.5  26.0 - 34.0 pg Final  . MCHC 03/21/2013 34.6  30.0 - 36.0 g/dL Final  . RDW 54/07/8118 13.8  11.5 - 15.5 % Final  . Platelets 03/21/2013 111* 150 - 400 K/uL Final   Comment: SPECIMEN CHECKED FOR CLOTS                          REPEATED TO VERIFY                          PLATELET COUNT CONFIRMED BY SMEAR  . Sodium 03/21/2013 139  135 - 145 mEq/L Final  . Potassium 03/21/2013 4.7  3.5 - 5.1 mEq/L Final  . Chloride 03/21/2013 105  96 - 112 mEq/L Final  . CO2 03/21/2013 29  19 - 32 mEq/L Final  . Glucose, Bld 03/21/2013 94  70 - 99 mg/dL Final  . BUN 14/78/2956 17  6 - 23 mg/dL Final  . Creatinine, Ser 03/21/2013 1.66* 0.50 - 1.35 mg/dL Final  . Calcium 21/30/8657 9.4  8.4 -  10.5 mg/dL Final  . Total Protein 03/21/2013 6.7  6.0 - 8.3 g/dL Final  . Albumin 84/69/6295 3.5  3.5 - 5.2 g/dL Final  . AST 28/41/3244 18  0 - 37 U/L Final  . ALT 03/21/2013 11  0 - 53 U/L Final  . Alkaline Phosphatase 03/21/2013 69  39 - 117 U/L Final  . Total Bilirubin 03/21/2013 0.5  0.3 - 1.2 mg/dL Final  . GFR calc non Af Amer 03/21/2013 38* >90 mL/min Final  . GFR calc Af Amer 03/21/2013 44* >90 mL/min Final   Comment:                                 The eGFR has been calculated                          using the CKD EPI equation.  This calculation has not been                          validated in all clinical                          situations.                          eGFR's persistently                          <90 mL/min signify                          possible Chronic Kidney Disease.  Marland Kitchen Prothrombin Time 03/21/2013 15.2  11.6 - 15.2 seconds Final  . INR 03/21/2013 1.22  0.00 - 1.49 Final  . ABO/RH(D) 03/21/2013 O POS   Final  . Antibody Screen 03/21/2013 NEG   Final  . Sample Expiration 03/21/2013 03/31/2013   Final  . Color, Urine 03/21/2013 YELLOW  YELLOW Final  . APPearance 03/21/2013 CLEAR  CLEAR Final  . Specific Gravity, Urine 03/21/2013 1.015  1.005 - 1.030 Final  . pH 03/21/2013 6.0  5.0 - 8.0 Final  . Glucose, UA 03/21/2013 NEGATIVE  NEGATIVE mg/dL Final  . Hgb urine dipstick 03/21/2013 TRACE* NEGATIVE Final  . Bilirubin Urine 03/21/2013 NEGATIVE  NEGATIVE Final  . Ketones, ur 03/21/2013 NEGATIVE  NEGATIVE mg/dL Final  . Protein, ur 16/08/9603 NEGATIVE  NEGATIVE mg/dL Final  . Urobilinogen, UA 03/21/2013 0.2  0.0 - 1.0 mg/dL Final  . Nitrite 54/07/8118 NEGATIVE  NEGATIVE Final  . Leukocytes, UA 03/21/2013 NEGATIVE  NEGATIVE Final  . Squamous Epithelial / LPF 03/21/2013 RARE  RARE Final  . RBC / HPF 03/21/2013 0-2  <3 RBC/hpf Final     X-Rays:Dg Chest 2 View  03/21/2013  *RADIOLOGY REPORT*  Clinical Data: Preop for left knee  arthroplasty  CHEST - 2 VIEW  Comparison: 08/08/2011  Findings: The cardiomediastinal silhouette is stable.  Mild degenerative changes thoracic spine.  No acute infiltrate or pulmonary edema. Atherosclerotic calcifications of thoracic aorta.  IMPRESSION: No active disease.  No significant change.   Original Report Authenticated By: Natasha Mead, M.D.    Dg Abd 1 View  03/30/2013  *RADIOLOGY REPORT*  Clinical Data: 79 year old male abdominal distention, pain, nausea.  ABDOMEN - 1 VIEW  Comparison: [facility] CT abdomen and pelvis 01/07/2008.  Findings: Nonobstructed bowel gas pattern.  Chronic degenerative changes in the lumbar spine with mild scoliosis.  Chronic small pelvic phleboliths. No acute osseous abnormality identified.  IMPRESSION: Nonobstructed bowel gas pattern.   Original Report Authenticated By: Erskine Speed, M.D.    Dg Chest Port 1 View  04/03/2013  *RADIOLOGY REPORT*  Clinical Data: History of pulmonary edema.  PORTABLE CHEST - 1 VIEW  Comparison: Chest 04/01/2013.  Findings: Pulmonary edema has resolved.  Trace right pleural effusion is noted.  No left pleural effusion is seen.  No focal airspace disease.  IMPRESSION: Resolved pulmonary edema and basilar atelectasis.  Trace right pleural effusion noted.   Original Report Authenticated By: Holley Dexter, M.D.    Dg Chest Port 1 View  04/01/2013  *RADIOLOGY REPORT*  Clinical Data: Shortness of breath.  PORTABLE CHEST - 1 VIEW  Comparison: 03/21/2013 for  Findings: Cardiomegaly.  Pulmonary edema.  Right-sided pleural effusion is  suspected.  Calcified aorta.  IMPRESSION: Interval development of pulmonary edema.  Please see above.   Original Report Authenticated By: Lacy Duverney, M.D.     EKG: Orders placed during the hospital encounter of 03/28/13  . EKG 12-LEAD  . EKG 12-LEAD  . EKG 12-LEAD  . EKG 12-LEAD  . EKG 12-LEAD  . EKG 12-LEAD  . EKG 12-LEAD  . EKG 12-LEAD     Hospital Course: Stanley Robles is a 79 y.o. who was admitted  to [facility]. They were brought to the operating room on 03/28/2013 and underwent Procedure(s): LEFT TOTAL KNEE ARTHROPLASTY.  Patient tolerated the procedure well and was later transferred to the recovery room and then to the orthopaedic floor for postoperative care.  They were given PO and IV analgesics for pain control following their surgery.  They were given 24 hours of postoperative antibiotics of  Anti-infectives   Start     Dose/Rate Route Frequency Ordered Stop                       03/28/13 1600  ceFAZolin (ANCEF) IVPB 1 g/50 mL premix     1 g 100 mL/hr over 30 Minutes Intravenous Every 6 hours 03/28/13 1336 03/28/13 2226   03/28/13 0645  ceFAZolin (ANCEF) IVPB 2 g/50 mL premix    Comments:  Dose reduced to 2g per P&T policy for weight < 120kg (weight 81kg)   2 g 100 mL/hr over 30 Minutes Intravenous On call to O.R. 03/28/13 9811 03/28/13 0923    and started on DVT prophylaxis in the form of Lovenox and Coumadin. PT and OT were ordered for total joint protocol. Discharge planning consulted to help with postop disposition and equipment needs. Patient had a decent night on the evening of surgery. They wanted to look into SNF following the hospital stay. They started to get up OOB with therapy on day one. Hemovac drain was pulled without difficulty. Continued to work with therapy into day two. Pain was under better control. Dressing was changed on day two and the incision was healing well. By day three, the patient had progressed with therapy and meeting their goals. Incision was healing well. Patient was seen in rounds and due to the vomiting the evening before, they were monitored for any further nausea. As long as they did well with the liquid breakfast and a regular lunch, then would transfer to the SNF on Thursday 03/31/2013.  Addendum - The patient did not do well on that Thursday so he was kept in house on 03/31/2013.  Later that evening the patient's heart rate was noted to be  ranging from 125 to 150 that evening.  Rapid response was called. The RR RN placed pt on cardiac monitor and did an EKG at 2211 which showed afib. RR RN called PCCM who gave orders to give 10 mg of IV Cardizem. Received orders to transfer pt down to SD and for [facility] to manage pt medically. Pt transferred down to room 1230. Seen by [facility]: Assessment/recommendation  79 year old gentleman with known history of atrial fibrillation and coronary disease now noted to be in atrial fibrillation with rapid response with EKG worrisome for demand ischemia.  1. atrial fibrillation with rapid response - will write for Cardizem drip and titrate. While in Cardizem drip will hold atenolol this can be restarted once his heart rate stabilizes. Cardiology is aware. Will come to evaluate the patient in the morning or sooner if decompensates. Will  order echo gram and check TSH. Check for signs of infection with UA and chest x-ray.  2. postoperative anemia - given history of coronary artery disease and abnormal EKG with demand ischemia we'll transfuse 1 unit.  3. abnormal EKG - cardiology is aware. Cycle cardiac enzymes repeat EKG in the morning. Most likely secondary to demand ischemia no chest pain. Patient is therapeutic on Coumadin.  4. constipation continue bowel regimen Cardiology also came and evaluated the patient. ECG: Afib with RVR, HR 139 Marked diffuse ST depression, no recent prior ecgs, no ecgs on this admission prior to onset of afib.  Impression:  Atrial Fibrillation  Demand Ischemia vs ACS  Anemia, likely acute blood loss  S/p recent TKA  Recommendations:  -No urgent invasive evaluation needed at this time. Would recommend supportive care.  -ASA 325mg , Nitro prn for pain  -IV Heparin for 48hrs if ok with Ortho post surgery given trop 1.8.  -Rate control with Dilt gtt for Afib. Transition back to his BB when better controlled  -Transfuse PRBCs for low HCT  -Trend troponins  -Monitor on  telemetry, ecg prn  -Discussed with [facility] and RN at bedside.  POD 4 - 04/01/2013 - Patient was transferred to Stepdown/ICU the night before.  HGB was 8.5 and blood was ordered.  Medicine continued to follow the patient. Atrial fibrillation with rapid response  Patient converted to SR this morning. Will repeat EKG to see if the ST changes persist. Elevated trop noted. Cycle trop. Due to crackles in lungs will obtain CXR as he could be fluid overloaded. Will stop IVF. ECHO has been ordered. TSH is pending. Defer further management to cardiology. Likely he can be changed to either oral cardizem or beta blocker. ECHO from 2012 showed normal EF with mild LVH.  Postoperative anemia  Given history of coronary artery disease and abnormal EKG with demand ischemia he was apparently transfused 1 unit or PRBC. Will repeat CBC.  Abnormal EKG with elevated Troponin in setting of known CAD  Most likely due to tachyarrythmia. On Warfarin. Heparin was contemplated by cardiology last night but not initiated. To consider antiplatelet agent but will defer further management to cardiology. Continue to cycle Troponin. No old cath reports available. Pharmacy managing warfarin.  Constipation  Change Miralax to scheduled. Continue stool softeners. TSH is pending.  Troponin was noted to be elevated at 1.81. Serial troponins have been ordered. HGB up to 9.3 after blood.  He was several days out from surgery and the chance of bleeding has decreased and need for Heparin since elevated troponin. Okay for Heparin from Ortho standpoint.  Cardiology felt that the patient had experience a Non STEMI. Assessment/Plan:  1. 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 Beta blocker discontinuation may have contributed.  2. A fib resolved  3. Acute CHF due to diastolic dysfunction and rapid afib  4. Stage 3 CKD  Rec:  I would stop IV heparin and restart beta blocker.  Wean diltiazem. Watch over weekend. If stable and no more afib, he could go to rehab on Monday. Check EKG tomorrow.  POD 5 - 04/02/2013 - Patient seen in rounds with Dr. Lequita Halt. He was off the IV Cardizem. Continued therapy. Monitor over the weekend. Patient was more stable, and has had no acute complaints or problems  Plan was to go Skilled nursing facility after hospital stay.  Assessment/Plan:  1. Non STEMI secondary to demand ischemia and  rapid afib He has known occlusion of the RCA and distal circ. Beta blocker discontinuation may have contributed.  2. A fib resolved  3. Acute CHF due to diastolic dysfunction and rapid afib  4. Stage 3 CKD Renal function stable  Okay to transfer to telemetry today.   POD 6 - 04/03/2013 - It was noted that the patient was having difficulty voiding.  He was started on Flomax the day before. He was maintaining NSR. Therapy had been resumed once the patient was stable and he was improving with therapy.   Medicine: Paroxysmal Atrial fibrillation  Remains in SR. Is back on Atenolol. Off cardizem infusion. Trop is decreasing. ECHO report reviewed and shows normal EF with no wall motion abnormalities. Elevated trop likely secondary to demand ischemia. Repeat EKG shows subtle ST changes but not as pronounced as before. TSH is normal.  Pulmonary Edema  Due to afib. Normal EF on ECHO. Diuresed well with Lasix. Will give oral dose for few more days. Repeat CXR shows improvement in edema.  Abnormal EKG with elevated Troponin in setting of known CAD  Most likely due to tachyarrythmia. On Warfarin. No further intervention planned by cardiology which seems reasonable especially as ECHO doesn't show concerning findings.  Urinary retention  Possibly from BPH and relative immobility. Bladder scan revealed greater than of urine. Will place foley. Will need to be discharged with Foley. His urologist is Dr. Earlene Plater. Dose of Flomax was increased yesterday. Hopefully, as he  becomes more ambulatory this issue will resolve. UA does not show infection.  Postoperative anemia  HGb is stable. Given history of coronary artery disease and abnormal EKG with demand ischemia he was transfused 1 unit of PRBC on 5/8-9.  Recent Left TKR  Stable. Management per Ortho  Constipation  Changed Miralax to scheduled. Continue stool softeners. TSH is normal. May need suppositories or enema.  POD 7 - 04/04/2013 - He was seen in rounds by Dr. Lequita Halt and was doing fairly well. He was followed up by Dr. Donnie Aho: OK to go to rehab from cardiac viewpoint. He should continue beta blocker at current dose while there. I will see in office when he gets out of SNF.  Medicine: Assessment/Plan:  Principal Problem:  OA (osteoarthritis) of knee  Active Problems:  Postoperative anemia due to acute blood loss  Postop Hyponatremia  Atrial fibrillation with RVR  Nonspecific abnormal electrocardiogram (ECG) (EKG)  Unspecified constipation  CAD (coronary artery disease)  Paroxysmal atrial fibrillation  Long-term (current) use of anticoagulants  Postop Hypokalemia  Urinary retention  Non-STEMI (non-ST elevated myocardial infarction)  Acute CHF (congestive heart failure)  Enterococcus UTI   Paroxysmal Atrial fibrillation  Remains in SR. Is back on Atenolol. Off cardizem infusion. Trop is decreasing. ECHO report reviewed and shows normal EF with no wall motion abnormalities. Elevated trop likely secondary to demand ischemia. Repeat EKG shows subtle ST changes but not as pronounced as before. TSH is normal. Will need close monitoring of PT/INR.  Pulmonary Edema  Due to afib. Normal EF on ECHO. Diuresed well with Lasix. Will give oral dose for few more days. Repeat CXR shows improvement in edema.  Elevated Creatinine  Has CKD per previous levels. Increased this time likely from Lasix. Should decrease as lasix is tapered off. SNF to recheck B[facility] in a few days.  Abnormal EKG with elevated Troponin in  setting of known CAD  Most likely due to tachyarrythmia. On Warfarin. No further intervention planned by cardiology which seems reasonable especially  as ECHO doesn't show concerning findings.  Urinary retention  Possibly from BPH and relative immobility. Bladder scan revealed greater than of urine 5/11. Foley was placed. Will need to be discharged with Foley. His urologist is Dr. Earlene Plater. Dose of Flomax was increased as well. Hopefully, as he becomes more ambulatory this issue will resolve. UA does not show infection.  Postoperative anemia  HGb is stable. Given history of coronary artery disease and abnormal EKG with demand ischemia he was transfused 1 unit of PRBC on 5/8-9.  Recent Left TKR  Stable. Management per Ortho  Constipation  Resolved. Changed Miralax to scheduled. Continue stool softeners. TSH is normal.    Discharge Medications: Prior to Admission medications   Medication Sig Start Date End Date Taking? Authorizing Provider  acetaminophen (TYLENOL) 500 MG tablet Take 1,000 mg by mouth every 6 (six) hours as needed for pain.   Yes Historical Provider, MD  alum & mag hydroxide-simeth (MAALOX/MYLANTA) 200-200-20 MG/5ML suspension Take 30 mLs by mouth every 6 (six) hours as needed. 03/31/13   [name] [name], PA-C  amoxicillin-clavulanate (AUGMENTIN) 500-125 MG per tablet Take 1 tablet (500 mg total) by mouth 2 (two) times daily. 04/04/13 04/14/2013  [name] Julien Girt, PA-C  aspirin EC 81 MG tablet Take 81 mg by mouth daily.    Historical Provider, MD  atenolol (TENORMIN) 25 MG tablet Take 1 tablet (25 mg total) by mouth 2 (two) times daily. 04/04/13   Osvaldo Shipper, MD  bisacodyl (DULCOLAX) 10 MG suppository Place 1 suppository (10 mg total) rectally daily as needed. 03/31/13   [name] [name], PA-C  Ca Carbonate-Mag Hydroxide (ROLAIDS PO) Take by mouth. PRN    Historical Provider, MD  diphenhydrAMINE (BENADRYL) 12.5 MG/5ML elixir Take 5-10 mLs (12.5-25 mg total) by mouth every  4 (four) hours as needed for itching. 03/31/13   [name] [name], PA-C  docusate sodium 100 MG CAPS Take 100 mg by mouth 2 (two) times daily. 03/31/13   [name] [name], PA-C  furosemide (LASIX) 20 MG tablet 1 tablet daily for 3 days and then stop. 04/04/13   Osvaldo Shipper, MD  methocarbamol (ROBAXIN) 500 MG tablet Take 1 tablet (500 mg total) by mouth every 6 (six) hours as needed. 03/31/13   [name] Julien Girt, PA-C  metoCLOPramide (REGLAN) 5 MG tablet Take 1-2 tablets (5-10 mg total) by mouth every 8 (eight) hours as needed (if ondansetron (ZOFRAN) ineffective.). 03/31/13   [name] Julien Girt, PA-C  nitroGLYCERIN (NITROSTAT) 0.4 MG SL tablet Place 0.4 mg under the tongue every 5 (five) minutes as needed for chest pain.    Historical Provider, MD  ondansetron (ZOFRAN) 4 MG tablet Take 1 tablet (4 mg total) by mouth every 6 (six) hours as needed for nausea. 03/31/13   [name] [name], PA-C  polyethylene glycol (MIRALAX / GLYCOLAX) packet Take 17 g by mouth daily. 04/04/13   Osvaldo Shipper, MD  polyvinyl alcohol (LIQUIFILM TEARS) 1.4 % ophthalmic solution Place 2 drops into both eyes as needed. 04/04/13   Osvaldo Shipper, MD  rosuvastatin (CRESTOR) 20 MG tablet Take 20 mg by mouth every other day.    Historical Provider, MD  sodium phosphate (FLEET) 7-19 GM/118ML ENEM Place 1 enema rectally daily as needed (for severe constipation). 04/04/13   Osvaldo Shipper, MD  tamsulosin (FLOMAX) 0.4 MG CAPS Take 2 capsules (0.8 mg total) by mouth daily after supper. 04/04/13   Osvaldo Shipper, MD  traMADol (ULTRAM) 50 MG tablet Take 1-2 tablets (50-100 mg total) by mouth every 6 (six) hours as needed (  mild pain). 03/31/13   [name] Julien Girt, PA-C  warfarin (COUMADIN) 2.5 MG tablet Take 1 tablet (2.5 mg total) by mouth daily. 04/04/13   Osvaldo Shipper, MD    Diet: Cardiac diet Activity:WBAT Follow-up:in 2 weeks Disposition - Skilled nursing facility - [facility] Discharged Condition: stable        Discharge Orders   Future Orders Complete By Expires     Call MD / Call 911  As directed     Comments:      If you experience chest pain or shortness of breath, CALL 911 and be transported to the hospital emergency room.  If you develope a fever above 101 F, pus (white drainage) or increased drainage or redness at the wound, or calf pain, call your surgeon's office.    Change dressing  As directed     Comments:      Change dressing daily with sterile 4 x 4 inch gauze dressing and apply TED hose. Do not submerge the incision under water.    Constipation Prevention  As directed     Comments:      Drink plenty of fluids.  Prune juice may be helpful.  You may use a stool softener, such as Colace (over the counter) 100 mg twice a day.  Use MiraLax (over the counter) for constipation as needed.    Diet - low sodium heart healthy  As directed     Discharge instructions  As directed     Comments:      Pick up stool softner and laxative for home. Do not submerge incision under water. May shower. Continue to use ice for pain and swelling from surgery.  Take Coumadin for three weeks for postoperative protocol and then the patient may resume their previous Coumadin home regimen.  The dose may need to be adjusted based upon the INR.  Please follow the INR and titrate Coumadin dose for a therapeutic range between 2.0 and 3.0 INR.  After completing the three weeks of Coumadin, the patient may resume their previous Coumadin home regimen.    Do not put a pillow under the knee. Place it under the heel.  As directed     Do not sit on low chairs, stoools or toilet seats, as it may be difficult to get up from low surfaces  As directed     Driving restrictions  As directed     Comments:      No driving until released by the physician.    Increase activity slowly as tolerated  As directed     Lifting restrictions  As directed     Comments:      No lifting until released by the physician.    Patient may  shower  As directed     Comments:      You may shower without a dressing once there is no drainage.  Do not wash over the wound.  If drainage remains, do not shower until drainage stops.    TED hose  As directed     Comments:      Use stockings (TED hose) for 3 weeks on both leg(s).  You may remove them at night for sleeping.    Weight bearing as tolerated  As directed         Medication List    STOP taking these medications       amLODipine 5 MG tablet  Commonly known as:  NORVASC     indomethacin 50 MG  capsule  Commonly known as:  INDOCIN     OVER THE COUNTER MEDICATION      TAKE these medications       acetaminophen 500 MG tablet  Commonly known as:  TYLENOL  Take 1,000 mg by mouth every 6 (six) hours as needed for pain.     alum & mag hydroxide-simeth 200-200-20 MG/5ML suspension  Commonly known as:  MAALOX/MYLANTA  Take 30 mLs by mouth every 6 (six) hours as needed.     amoxicillin-clavulanate 500-125 MG per tablet  Commonly known as:  AUGMENTIN  Take 1 tablet (500 mg total) by mouth 2 (two) times daily.     aspirin EC 81 MG tablet  Take 81 mg by mouth daily.     atenolol 25 MG tablet  Commonly known as:  TENORMIN  Take 1 tablet (25 mg total) by mouth 2 (two) times daily.     bisacodyl 10 MG suppository  Commonly known as:  DULCOLAX  Place 1 suppository (10 mg total) rectally daily as needed.     diphenhydrAMINE 12.5 MG/5ML elixir  Commonly known as:  BENADRYL  Take 5-10 mLs (12.5-25 mg total) by mouth every 4 (four) hours as needed for itching.     DSS 100 MG Caps  Take 100 mg by mouth 2 (two) times daily.     furosemide 20 MG tablet  Commonly known as:  LASIX  Starting 4/13, One tablet daily for 3 days and then stop.     methocarbamol 500 MG tablet  Commonly known as:  ROBAXIN  Take 1 tablet (500 mg total) by mouth every 6 (six) hours as needed.     metoCLOPramide 5 MG tablet  Commonly known as:  REGLAN  Take 1-2 tablets (5-10 mg total) by mouth  every 8 (eight) hours as needed (if ondansetron (ZOFRAN) ineffective.).     nitroGLYCERIN 0.4 MG SL tablet  Commonly known as:  NITROSTAT  Place 0.4 mg under the tongue every 5 (five) minutes as needed for chest pain.     ondansetron 4 MG tablet  Commonly known as:  ZOFRAN  Take 1 tablet (4 mg total) by mouth every 6 (six) hours as needed for nausea.     polyethylene glycol packet  Commonly known as:  MIRALAX / GLYCOLAX  Take 17 g by mouth daily.     polyvinyl alcohol 1.4 % ophthalmic solution  Commonly known as:  LIQUIFILM TEARS  Place 2 drops into both eyes as needed.     ROLAIDS PO  Take by mouth. PRN     rosuvastatin 20 MG tablet  Commonly known as:  CRESTOR  Take 20 mg by mouth every other day.     sodium phosphate 7-19 GM/118ML Enem  Place 1 enema rectally daily as needed (for severe constipation).     tamsulosin 0.4 MG Caps  Commonly known as:  FLOMAX  Take 2 capsules (0.8 mg total) by mouth daily after supper.     traMADol 50 MG tablet  Commonly known as:  ULTRAM  Take 1-2 tablets (50-100 mg total) by mouth every 6 (six) hours as needed (mild pain).     warfarin 2.5 MG tablet  Commonly known as:  COUMADIN  Take 1 tablet (2.5 mg total) by mouth daily.       Follow-up Information   Follow up with Loanne Drilling, MD. Schedule an appointment as soon as possible for a visit in 2 weeks.   Contact information:   [address], [address] [place] [address] [phone]       Schedule an appointment as soon as possible for a visit with TILLEY JR,W SPENCER, MD. (Follow up with Cardiologist once released from skilled facility.)    Contact information:   [address] [place] [address] [phone]       Follow up with DAVIS III, RONALD L, MD. Schedule an appointment as soon as possible for a visit in 1 week. (for urinary retention)    Contact information:   [address] [place]  [address] [phone]       Signed: Patrica Duel 04/04/2013, 12:16 PM

## 2013-04-04 NOTE — Progress Notes (Addendum)
Subjective: 7 Days Post-Op Procedure(s) (LRB): LEFT TOTAL KNEE ARTHROPLASTY (Left) Patient reports pain as mild.   Patient seen in rounds by Dr. Lequita Halt. Patient is well, and has had no acute complaints or problems Patient is ready to go to SNF if okay with cardiology today.  Will go ahead and set things up for release.  Objective: Vital signs in last 24 hours: Temp:  [97.7 F (36.5 C)-99.1 F (37.3 C)] 98.4 F (36.9 C) (05/12 0516) Pulse Rate:  [74-81] 78 (05/12 0516) Resp:  [18-20] 20 (05/12 0516) BP: (106-122)/(41-49) 106/49 mmHg (05/12 0516) SpO2:  [93 %-95 %] 95 % (05/12 0516) Weight:  [79.2 kg (174 lb 9.7 oz)] 79.2 kg (174 lb 9.7 oz) (05/12 0516)  Intake/Output from previous day:  Intake/Output Summary (Last 24 hours) at 04/04/13 0741 Last data filed at 04/04/13 0522  Gross per 24 hour  Intake    603 ml  Output   1525 ml  Net   -922 ml     Labs:  Recent Labs  04/01/13 0806 04/02/13 0348 04/03/13 0500  HGB 9.3* 9.4* 9.5*    Recent Labs  04/02/13 0348 04/03/13 0500  WBC 5.7 6.2  RBC 2.99* 3.07*  HCT 27.1* 28.3*  PLT 133* 149*    Recent Labs  04/03/13 0500 04/04/13 0436  NA 137 135  K 3.7 3.9  CL 99 96  CO2 30 31  BUN 25* 29*  CREATININE 1.67* 1.80*  GLUCOSE 104* 110*  CALCIUM 8.7 8.4    Recent Labs  04/03/13 0500 04/04/13 0436  INR 1.69* 1.73*    EXAM: General - Patient is Alert, Appropriate and Oriented Extremity - Neurovascular intact Sensation intact distally Dorsiflexion/Plantar flexion intact No cellulitis present Incision - clean, dry, no drainage, healing Motor Function - intact, moving foot and toes well on exam.   Assessment/Plan: 7 Days Post-Op Procedure(s) (LRB): LEFT TOTAL KNEE ARTHROPLASTY (Left) Procedure(s) (LRB): LEFT TOTAL KNEE ARTHROPLASTY (Left) Past Medical History  Diagnosis Date  . Hyperlipidemia   . Peripheral vascular disease   . Coronary artery disease     DR. TILLEY IS PT'S CARDIOLGIST  .  Myocardial infarction 1994  . Gout     USUALLY IN THE FEET  . BPH (benign prostatic hypertrophy)   . GERD (gastroesophageal reflux disease)     ROLAIDS IF NEEDED  . 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 Tooth disease     PT STATES HE IS SCHEDULED TO HAVE BAD TOOTH PULLED 03/24/13 - WILL CHECK WITH DR. Lequita Halt TO MAKE SURE THIS IS OK TO DO BEFORE HIS PLANNED KNEE REPLACEMENT ON 5/5.  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 Chronic kidney disease stage 3   . Hypertensive heart disease   . Paroxysmal atrial fibrillation 04/01/2013  . Osteoarthritis    Principal Problem:   OA (osteoarthritis) of knee Active Problems:   Postoperative anemia due to acute blood loss   Postop Hyponatremia   Atrial fibrillation with RVR   Nonspecific abnormal electrocardiogram (ECG) (EKG)   Unspecified constipation   CAD (coronary artery disease)   Paroxysmal atrial fibrillation   Long-term (current) use of anticoagulants   Postop Hypokalemia   Urinary retention  Estimated body mass index is 26.55 kg/(m^2) as calculated from the following:   Height as of this encounter: 5\' 8"  (1.727 m).   Weight as of this encounter: 79.2 kg (174  lb 9.7 oz). Discharge home with home health id okay form a cardiac standpoint to go. Diet - Cardiac diet Follow up - in 2 weeks from surgery Activity - WBAT Disposition - Skilled nursing facility - [facility] Condition Upon Discharge - pending  D/C Meds - See DC Summary DVT Prophylaxis - Coumadin, INR today is 1.73  [name], [name] 04/04/2013, 7:41 AM

## 2013-04-04 NOTE — Progress Notes (Signed)
Subjective:  Had a good weekend except for urinary retention.  No recurrent a fib.  Not SOB, no chest pain. C/O knee pain still.  Objective:  Vital Signs in the last 24 hours: BP 106/49  Pulse 78  Temp(Src) 98.4 F (36.9 C) (Oral)  Resp 20  Ht 5\' 8"  (1.727 m)  Wt 79.2 kg (174 lb 9.7 oz)  BMI 26.55 kg/m2  SpO2 95%  Physical Exam: Pleasant WM in NAD Lungs:  Clear  Cardiac:  Regular rhythm, normal S1 and S2, no S3, 1-2/6 murmur Abdomen:  Soft, nontender, no masses Extremities:  No edema present  Intake/Output from previous day: 05/11 0701 - 05/12 0700 In: 1083 [P.O.:1080; I.V.:3] Out: 1525 [Urine:1525]  Weight Filed Weights   03/28/13 1430 04/04/13 0516  Weight: 81.194 kg (179 lb) 79.2 kg (174 lb 9.7 oz)    Lab Results: Basic Metabolic Panel:  Recent Labs  11/91/47 0500 04/04/13 0436  NA 137 135  K 3.7 3.9  CL 99 96  CO2 30 31  GLUCOSE 104* 110*  BUN 25* 29*  CREATININE 1.67* 1.80*   CBC:  Recent Labs  04/02/13 0348 04/03/13 0500  WBC 5.7 6.2  HGB 9.4* 9.5*  HCT 27.1* 28.3*  MCV 90.6 92.2  PLT 133* 149*   Cardiac Enzymes:  Recent Labs  04/01/13 1639 04/02/13 0813 04/03/13 0500  TROPONINI 8.55* 7.83* 4.31*    Telemetry: Currently NSR.  Rare PAC's  ECHO  EF 55%, moderate MR, Mild RV dilation, moderate pulmonary hypertension.  Assessment/Plan:  1. 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 Beta blocker hold may have contributed. 2. A fib resolved 3. Acute CHF due to diastolic dysfunction and rapid afib  Now resolved 4. Stage 3 CKD  Rec:  OK to go to rehab from cardiac viewpoint.  He should continue beta blocker at current dose while there. I will see in office when he gets out of SNF.  Darden Palmer  MD [facility]  04/04/2013, 8:58 AM

## 2013-04-04 NOTE — Progress Notes (Signed)
Assisted patient in bathroom, voided and had bowel movement.  Ambulated well with aid of walker and one assist, returned to chair with elevated legs.

## 2013-04-04 NOTE — Progress Notes (Signed)
Clinical Social Work  CSW faxed DC summary to [place] who is agreeable to admission today. CSW informed patient, dtr and RN of DC plans and all parties agreeable. CSW prepared DC packet with FL2 included. CSW coordinated transportation via [place]. CSW is signing off but available if needed.  Unk Lightning, LCSW (Coverage for [employer])

## 2013-04-04 NOTE — Progress Notes (Addendum)
[facility] PROGRESS NOTE  Xzavien Harada WUJ:[id number] DOB: 03-31-1932 DOA: 03/28/2013  PCP: Ignatius Specking., MD  Brief HPI: Stanley Robles is a 79 y.o. male who has a past medical history of Dysrhythmia; Hyperlipidemia; Peripheral vascular disease; Chronic kidney disease; Coronary artery disease; Myocardial infarction (1994); Gout; BPH (benign prostatic hypertrophy); Stroke; Hypertension; GERD (gastroesophageal reflux disease); Cancer; Arthritis; and Tooth disease. [facility] was called by Dr. Ranell Patrick with Orthopedics. Patient had been admitted by Dr. Berton Lan for a left total knee arthroplasty which was done on 03/28/2013. He has known history of atrial fibrillation and is on chronic Coumadin which had been held for the procedure but now has been restarted. He was ready for his discharge to nursing home but it was delayed due to constipation. On the night of 5/8 he developed tachycardia which was diagnosed as atrial fibrillation with RVR per EKG with heart rates going as high as 160s. CCM was called initially and gave an order for Cardizem 10 mg IV. Subsequently his heart rate came down to 130s. Hemodynamically he remained stable with stable blood pressure. He denied any current chest pain. Of note patient does has history of known coronary artery disease and followed by Dr. Donnie Aho. EKG which were obtained while patient was in atrial fibrillation with rapid response showed evidence for ST depression which was new in the lateral leads. This was thought to be likely due to demand ischemia. Patient converted to Sr on 5/9. He was noted to have pulm edema on CXR. He was given Lasix.   Past medical history:  Past Medical History  Diagnosis Date  . Hyperlipidemia   . Peripheral vascular disease   . Coronary artery disease     DR. TILLEY IS PT'S CARDIOLGIST  . Myocardial infarction 1994  . Gout     USUALLY IN THE FEET  . BPH (benign prostatic hypertrophy)   . GERD (gastroesophageal reflux disease)     ROLAIDS  IF NEEDED  . 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 Tooth disease     PT STATES HE IS SCHEDULED TO HAVE BAD TOOTH PULLED 03/24/13 - WILL CHECK WITH DR. Lequita Halt TO MAKE SURE THIS IS OK TO DO BEFORE HIS PLANNED KNEE REPLACEMENT ON 5/5.  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 Chronic kidney disease stage 3   . Hypertensive heart disease   . Paroxysmal atrial fibrillation 04/01/2013  . Osteoarthritis     Consultants: Dr. Donnie Aho  Procedures: Left TKR 5/5  Antibiotics: None  Subjective: Patient feels better. No further pain in lower abdomen since foley was placed. No shortness of breath.  Objective: Vital Signs  Filed Vitals:   04/03/13 1343 04/03/13 1600 04/03/13 2027 04/04/13 0516  BP: 122/41  119/47 106/49  Pulse: 74  81 78  Temp: 97.7 F (36.5 C)  99.1 F (37.3 C) 98.4 F (36.9 C)  TempSrc: Oral  Oral Oral  Resp: 18 18 20 20   Height:      Weight:    79.2 kg (174 lb 9.7 oz)  SpO2: 93% 94% 94% 95%    Intake/Output Summary (Last 24 hours) at 04/04/13 1030 Last data filed at 04/04/13 0900  Gross per 24 hour  Intake    843 ml  Output   1526 ml  Net   -683 ml   Filed Weights   03/28/13 1430 04/04/13 0516  Weight: 81.194 kg (179 lb) 79.2  kg (174 lb 9.7 oz)    General appearance: alert, cooperative, appears stated age and no distress Back: symmetric, no curvature. ROM normal. No CVA tenderness. Resp: Improved air entry. No crackles No wheezing. Cardio: regular rate and rhythm, S1, S2 normal, no murmur, click, rub or gallop GI: soft, tender in suprapubic area. bowel sounds normal; no masses,  no organomegaly Extremities: Left knee in brace.  Neurologic: Alert and oriented X 3, normal strength and tone. No focal deficits.  Lab Results:  Basic Metabolic Panel:  Recent Labs Lab 03/30/13 0500 04/01/13 0806 04/02/13 0348 04/03/13 0500 04/04/13 0436  NA 131* 136 135 137 135   K 4.5 3.8 3.2* 3.7 3.9  CL 98 102 97 99 96  CO2 26 25 29 30 31   GLUCOSE 138* 132* 114* 104* 110*  BUN 28* 25* 24* 25* 29*  CREATININE 1.87* 1.57* 1.59* 1.67* 1.80*  CALCIUM 8.5 8.4 8.4 8.7 8.4   CBC:  Recent Labs Lab 03/30/13 0500 03/31/13 0540 04/01/13 0806 04/02/13 0348 04/03/13 0500  WBC 12.8* 7.2 7.2 5.7 6.2  HGB 10.2* 8.5* 9.3* 9.4* 9.5*  HCT 30.5* 24.7* 26.2* 27.1* 28.3*  MCV 92.4 91.8 91.0 90.6 92.2  PLT 109* 91* 139* 133* 149*   Cardiac Enzymes:  Recent Labs Lab 04/01/13 0010 04/01/13 0806 04/01/13 1639 04/02/13 0813 04/03/13 0500  TROPONINI 1.81* 8.53* 8.55* 7.83* 4.31*   BNP (last 3 results)  Recent Labs  04/01/13 0806 04/02/13 0348  PROBNP 5562.0* 4701.0*    Studies/Results: Dg Chest Port 1 View  04/03/2013  *RADIOLOGY REPORT*  Clinical Data: History of pulmonary edema.  PORTABLE CHEST - 1 VIEW  Comparison: Chest 04/01/2013.  Findings: Pulmonary edema has resolved.  Trace right pleural effusion is noted.  No left pleural effusion is seen.  No focal airspace disease.  IMPRESSION: Resolved pulmonary edema and basilar atelectasis.  Trace right pleural effusion noted.   Original Report Authenticated By: Holley Dexter, M.D.     Medications:  Scheduled: . amoxicillin-clavulanate  1 tablet Oral BID  . atenolol  25 mg Oral BID  . atorvastatin  40 mg Oral q1800  . docusate sodium  100 mg Oral BID  . furosemide  40 mg Oral Daily  . polyethylene glycol  17 g Oral BID  . sodium chloride  3 mL Intravenous Q12H  . tamsulosin  0.8 mg Oral QPC supper  . Warfarin - Pharmacist Dosing Inpatient   Does not apply q1800   Continuous:   ZOX:WRUEAV chloride, acetaminophen, acetaminophen, alum & mag hydroxide-simeth, bisacodyl, calcium carbonate, diphenhydrAMINE, menthol-cetylpyridinium, methocarbamol (ROBAXIN) IV, methocarbamol, metoCLOPramide (REGLAN) injection, metoCLOPramide, nitroGLYCERIN, ondansetron (ZOFRAN) IV, ondansetron, oxyCODONE, phenol, polyvinyl  alcohol, sodium chloride, sodium phosphate, traMADol  Assessment/Plan:  Principal Problem:   OA (osteoarthritis) of knee Active Problems:   Postoperative anemia due to acute blood loss   Postop Hyponatremia   Atrial fibrillation with RVR   Nonspecific abnormal electrocardiogram (ECG) (EKG)   Unspecified constipation   CAD (coronary artery disease)   Paroxysmal atrial fibrillation   Long-term (current) use of anticoagulants   Postop Hypokalemia   Urinary retention   Non-STEMI (non-ST elevated myocardial infarction)   Acute CHF (congestive heart failure)   Enterococcus UTI    Paroxysmal Atrial fibrillation Remains in SR. Is back on Atenolol. Off cardizem infusion. Trop is decreasing. ECHO report reviewed and shows normal EF with no wall motion abnormalities. Elevated trop likely secondary to demand ischemia. Repeat EKG shows subtle ST changes but not as pronounced as before. TSH  is normal. Will need close monitoring of PT/INR.  Pulmonary Edema Due to afib. Normal EF on ECHO. Diuresed well with Lasix. Will give oral dose for few more days. Repeat CXR shows improvement in edema.  Elevated Creatinine Has CKD per previous levels. Increased this time likely from Lasix. Should decrease as lasix is tapered off. SNF to recheck [facility] in a few days.  Abnormal EKG with elevated Troponin in setting of known CAD Most likely due to tachyarrythmia. On Warfarin. No further intervention planned by cardiology which seems reasonable especially as ECHO doesn't show concerning findings.   Urinary retention Possibly from BPH and relative immobility. Bladder scan revealed greater than of urine 5/11. Foley was placed. Will need to be discharged with Foley. His urologist is Dr. Earlene Plater. Dose of Flomax was increased as well. Hopefully, as he becomes more ambulatory this issue will resolve. UA does not show infection. However culture reported 95k colonies of Enterococcus. Significance is unclear. Agree  with treating for now.  Postoperative anemia HGb is stable. Given history of coronary artery disease and abnormal EKG with demand ischemia he was transfused 1 unit of PRBC on 5/8-9.   Recent Left TKR Stable. Management per Ortho  Constipation Resolved. Changed Miralax to scheduled. Continue stool softeners. TSH is normal.   Code Status: Full Code DVT Prophylaxis:   Warfarin.  Family Communication: Discussed with patient  Disposition Plan: Ok for discharge to SNF.   LOS: 7 days   [facility]  [facility] Pager [phone] 04/04/2013, 10:30 AM  If 8PM-8AM, please contact night-coverage at [email or web address], password [email or web address]

## 2013-05-19 ENCOUNTER — Ambulatory Visit: Payer: Medicare Other | Attending: Orthopedic Surgery | Admitting: Physical Therapy

## 2013-05-19 DIAGNOSIS — R5381 Other malaise: Secondary | ICD-10-CM | POA: Insufficient documentation

## 2013-05-19 DIAGNOSIS — M25569 Pain in unspecified knee: Secondary | ICD-10-CM | POA: Insufficient documentation

## 2013-05-19 DIAGNOSIS — Z96659 Presence of unspecified artificial knee joint: Secondary | ICD-10-CM | POA: Insufficient documentation

## 2013-05-19 DIAGNOSIS — M25669 Stiffness of unspecified knee, not elsewhere classified: Secondary | ICD-10-CM | POA: Insufficient documentation

## 2013-05-19 DIAGNOSIS — IMO0001 Reserved for inherently not codable concepts without codable children: Secondary | ICD-10-CM | POA: Insufficient documentation

## 2013-05-23 ENCOUNTER — Ambulatory Visit: Payer: Medicare Other | Admitting: Physical Therapy

## 2013-05-24 ENCOUNTER — Ambulatory Visit: Payer: Medicare Other | Attending: Orthopedic Surgery | Admitting: Physical Therapy

## 2013-05-24 DIAGNOSIS — M25669 Stiffness of unspecified knee, not elsewhere classified: Secondary | ICD-10-CM | POA: Insufficient documentation

## 2013-05-24 DIAGNOSIS — IMO0001 Reserved for inherently not codable concepts without codable children: Secondary | ICD-10-CM | POA: Insufficient documentation

## 2013-05-24 DIAGNOSIS — M25569 Pain in unspecified knee: Secondary | ICD-10-CM | POA: Insufficient documentation

## 2013-05-24 DIAGNOSIS — R5381 Other malaise: Secondary | ICD-10-CM | POA: Insufficient documentation

## 2013-05-24 DIAGNOSIS — Z96659 Presence of unspecified artificial knee joint: Secondary | ICD-10-CM | POA: Insufficient documentation

## 2013-06-07 ENCOUNTER — Ambulatory Visit: Payer: Medicare Other | Admitting: Physical Therapy

## 2013-06-08 ENCOUNTER — Ambulatory Visit: Payer: Medicare Other | Admitting: Physical Therapy

## 2013-06-15 ENCOUNTER — Ambulatory Visit: Payer: Medicare Other | Admitting: Physical Therapy

## 2013-06-16 ENCOUNTER — Ambulatory Visit: Payer: Medicare Other | Admitting: Physical Therapy

## 2014-08-29 ENCOUNTER — Other Ambulatory Visit: Payer: Self-pay | Admitting: Cardiology

## 2014-08-29 ENCOUNTER — Ambulatory Visit
Admission: RE | Admit: 2014-08-29 | Discharge: 2014-08-29 | Disposition: A | Discharge status: Home / Self Care | Payer: Medicare Other | Source: Ambulatory Visit | Attending: Cardiology | Admitting: Cardiology

## 2014-08-29 DIAGNOSIS — R0789 Other chest pain: Secondary | ICD-10-CM

## 2015-02-27 ENCOUNTER — Inpatient Hospital Stay (HOSPITAL_COMMUNITY)
Admission: EM | Admit: 2015-02-27 | Discharge: 2015-03-02 | DRG: 057 | Disposition: A | Discharge status: Skilled Nursing Facility | Payer: Medicare Other | Attending: Internal Medicine | Admitting: Internal Medicine

## 2015-02-27 ENCOUNTER — Inpatient Hospital Stay (HOSPITAL_COMMUNITY): Payer: Medicare Other

## 2015-02-27 ENCOUNTER — Emergency Department (HOSPITAL_COMMUNITY): Payer: Medicare Other

## 2015-02-27 ENCOUNTER — Encounter (HOSPITAL_COMMUNITY): Payer: Self-pay | Admitting: Emergency Medicine

## 2015-02-27 DIAGNOSIS — I739 Peripheral vascular disease, unspecified: Secondary | ICD-10-CM | POA: Diagnosis present

## 2015-02-27 DIAGNOSIS — Z7901 Long term (current) use of anticoagulants: Secondary | ICD-10-CM | POA: Diagnosis not present

## 2015-02-27 DIAGNOSIS — R1310 Dysphagia, unspecified: Secondary | ICD-10-CM | POA: Diagnosis present

## 2015-02-27 DIAGNOSIS — D696 Thrombocytopenia, unspecified: Secondary | ICD-10-CM | POA: Diagnosis present

## 2015-02-27 DIAGNOSIS — I1310 Hypertensive heart and chronic kidney disease without heart failure, with stage 1 through stage 4 chronic kidney disease, or unspecified chronic kidney disease: Secondary | ICD-10-CM | POA: Diagnosis present

## 2015-02-27 DIAGNOSIS — I69321 Dysphasia following cerebral infarction: Secondary | ICD-10-CM

## 2015-02-27 DIAGNOSIS — I2510 Atherosclerotic heart disease of native coronary artery without angina pectoris: Secondary | ICD-10-CM | POA: Diagnosis present

## 2015-02-27 DIAGNOSIS — N183 Chronic kidney disease, stage 3 unspecified: Secondary | ICD-10-CM | POA: Diagnosis present

## 2015-02-27 DIAGNOSIS — I69391 Dysphagia following cerebral infarction: Secondary | ICD-10-CM | POA: Diagnosis not present

## 2015-02-27 DIAGNOSIS — M109 Gout, unspecified: Secondary | ICD-10-CM | POA: Diagnosis present

## 2015-02-27 DIAGNOSIS — I252 Old myocardial infarction: Secondary | ICD-10-CM | POA: Diagnosis not present

## 2015-02-27 DIAGNOSIS — M6289 Other specified disorders of muscle: Secondary | ICD-10-CM | POA: Diagnosis not present

## 2015-02-27 DIAGNOSIS — Z85820 Personal history of malignant melanoma of skin: Secondary | ICD-10-CM

## 2015-02-27 DIAGNOSIS — K219 Gastro-esophageal reflux disease without esophagitis: Secondary | ICD-10-CM | POA: Diagnosis present

## 2015-02-27 DIAGNOSIS — I69354 Hemiplegia and hemiparesis following cerebral infarction affecting left non-dominant side: Secondary | ICD-10-CM | POA: Diagnosis not present

## 2015-02-27 DIAGNOSIS — I639 Cerebral infarction, unspecified: Secondary | ICD-10-CM | POA: Diagnosis present

## 2015-02-27 DIAGNOSIS — E785 Hyperlipidemia, unspecified: Secondary | ICD-10-CM | POA: Diagnosis present

## 2015-02-27 DIAGNOSIS — N400 Enlarged prostate without lower urinary tract symptoms: Secondary | ICD-10-CM | POA: Diagnosis present

## 2015-02-27 DIAGNOSIS — Z87891 Personal history of nicotine dependence: Secondary | ICD-10-CM | POA: Diagnosis not present

## 2015-02-27 DIAGNOSIS — R531 Weakness: Secondary | ICD-10-CM | POA: Diagnosis present

## 2015-02-27 DIAGNOSIS — M1990 Unspecified osteoarthritis, unspecified site: Secondary | ICD-10-CM | POA: Diagnosis present

## 2015-02-27 DIAGNOSIS — I6340 Cerebral infarction due to embolism of unspecified cerebral artery: Secondary | ICD-10-CM | POA: Diagnosis not present

## 2015-02-27 DIAGNOSIS — Z96652 Presence of left artificial knee joint: Secondary | ICD-10-CM | POA: Diagnosis present

## 2015-02-27 DIAGNOSIS — I480 Paroxysmal atrial fibrillation: Secondary | ICD-10-CM | POA: Diagnosis present

## 2015-02-27 DIAGNOSIS — I6350 Cerebral infarction due to unspecified occlusion or stenosis of unspecified cerebral artery: Secondary | ICD-10-CM | POA: Diagnosis present

## 2015-02-27 HISTORY — DX: Cerebral infarction due to unspecified occlusion or stenosis of unspecified cerebral artery: I63.50

## 2015-02-27 LAB — COMPREHENSIVE METABOLIC PANEL
ALT: 16 U/L (ref 0–53)
AST: 15 U/L (ref 0–37)
Albumin: 3 g/dL — ABNORMAL LOW (ref 3.5–5.2)
Alkaline Phosphatase: 75 U/L (ref 39–117)
Anion gap: 6 (ref 5–15)
BUN: 24 mg/dL — ABNORMAL HIGH (ref 6–23)
CALCIUM: 8.5 mg/dL (ref 8.4–10.5)
CO2: 24 mmol/L (ref 19–32)
Chloride: 108 mmol/L (ref 96–112)
Creatinine, Ser: 1.59 mg/dL — ABNORMAL HIGH (ref 0.50–1.35)
GFR, EST AFRICAN AMERICAN: 46 mL/min — AB (ref >90)
GFR, EST NON AFRICAN AMERICAN: 39 mL/min — AB (ref >90)
GLUCOSE: 88 mg/dL (ref 70–99)
Potassium: 4 mmol/L (ref 3.5–5.1)
Sodium: 138 mmol/L (ref 135–145)
Total Bilirubin: 1.1 mg/dL (ref 0.3–1.2)
Total Protein: 5.8 g/dL — ABNORMAL LOW (ref 6.0–8.3)

## 2015-02-27 LAB — CBC
HCT: 49 % (ref 39.0–52.0)
Hemoglobin: 17.3 g/dL — ABNORMAL HIGH (ref 13.0–17.0)
MCH: 34.3 pg — ABNORMAL HIGH (ref 26.0–34.0)
MCHC: 35.3 g/dL (ref 30.0–36.0)
MCV: 97 fL (ref 78.0–100.0)
Platelets: 100 10*3/uL — ABNORMAL LOW (ref 150–400)
RBC: 5.05 MIL/uL (ref 4.22–5.81)
RDW: 14.3 % (ref 11.5–15.5)
WBC: 10.4 10*3/uL (ref 4.0–10.5)

## 2015-02-27 LAB — DIFFERENTIAL
Basophils Absolute: 0 10*3/uL (ref 0.0–0.1)
Basophils Relative: 0 % (ref 0–1)
Eosinophils Absolute: 0.1 10*3/uL (ref 0.0–0.7)
Eosinophils Relative: 1 % (ref 0–5)
LYMPHS ABS: 1.5 10*3/uL (ref 0.7–4.0)
LYMPHS PCT: 14 % (ref 12–46)
MONOS PCT: 6 % (ref 3–12)
Monocytes Absolute: 0.7 10*3/uL (ref 0.1–1.0)
NEUTROS ABS: 8.2 10*3/uL — AB (ref 1.7–7.7)
NEUTROS PCT: 79 % — AB (ref 43–77)

## 2015-02-27 LAB — I-STAT CHEM 8, ED
BUN: 27 mg/dL — AB (ref 6–23)
CREATININE: 1.5 mg/dL — AB (ref 0.50–1.35)
Calcium, Ion: 1.11 mmol/L — ABNORMAL LOW (ref 1.13–1.30)
Chloride: 105 mmol/L (ref 96–112)
Glucose, Bld: 87 mg/dL (ref 70–99)
HCT: 49 % (ref 39.0–52.0)
Hemoglobin: 16.7 g/dL (ref 13.0–17.0)
POTASSIUM: 4.1 mmol/L (ref 3.5–5.1)
SODIUM: 139 mmol/L (ref 135–145)
TCO2: 20 mmol/L (ref 0–100)

## 2015-02-27 LAB — I-STAT TROPONIN, ED: Troponin i, poc: 0 ng/mL (ref 0.00–0.08)

## 2015-02-27 LAB — PROTIME-INR
INR: 1.44 (ref 0.00–1.49)
PROTHROMBIN TIME: 17.6 s — AB (ref 11.6–15.2)

## 2015-02-27 LAB — CBG MONITORING, ED: GLUCOSE-CAPILLARY: 75 mg/dL (ref 70–99)

## 2015-02-27 LAB — APTT: APTT: 34 s (ref 24–37)

## 2015-02-27 MED ORDER — ASPIRIN 325 MG PO TABS: 325.0000 mg | ORAL_TABLET | Freq: Every day | ORAL | Status: DC

## 2015-02-27 MED ORDER — SODIUM CHLORIDE 0.9 % IV SOLN
INTRAVENOUS | Status: DC
  Administered 2015-02-27: 18:00:00 via INTRAVENOUS
  Administered 2015-02-28 – 2015-03-02 (×2): 75 mL/h via INTRAVENOUS

## 2015-02-27 MED ORDER — STROKE: EARLY STAGES OF RECOVERY BOOK
Freq: Once | Status: AC
  Administered 2015-02-27: 17:00:00

## 2015-02-27 MED ORDER — ASPIRIN 300 MG RE SUPP
300.0000 mg | Freq: Every day | RECTAL | Status: DC
  Administered 2015-02-27: 300 mg via RECTAL
  Filled 2015-02-27 (×2): qty 1

## 2015-02-27 MED ORDER — HEPARIN SODIUM (PORCINE) 5000 UNIT/ML IJ SOLN
5000.0000 [IU] | Freq: Three times a day (TID) | INTRAMUSCULAR | Status: DC
  Administered 2015-02-27 – 2015-02-28 (×3): 5000 [IU] via SUBCUTANEOUS
  Filled 2015-02-27 (×3): qty 1

## 2015-02-27 MED ORDER — METOPROLOL TARTRATE 1 MG/ML IV SOLN: 5.0000 mg | Freq: Four times a day (QID) | INTRAVENOUS | Status: DC | PRN

## 2015-02-27 NOTE — Consult Note (Signed)
Referring Physician: Pfeiffer    Chief Complaint: Stroke  HPI:                                                                                                                                         Stanley Robles is an 81 y.o. male who was vacationing in [place] abdominal family noted his speech was dysarthric.  They returned home and went to his PCP. The daughter at bedside was not at that visit but states his INR was supra-therapeutic and he was told to hold his Coumadin and restart on Sunday.  The daughter states over the past 3 days she feels his left sided weakness has worsened.  Patient himself does not feels anything has changed. MRI obtained in [place] shows a right pontine infarct which again is visualized on CT here in ED.   Date last known well: Date: 02/21/2015 Time last known well: Unable to determine tPA Given: No: previous stroke 4 days prior Modified Rankin: Rankin Score=1   Past Medical History  Diagnosis Date  . Hyperlipidemia   . Peripheral vascular disease   . Coronary artery disease     DR. TILLEY IS PT'S CARDIOLGIST  . Myocardial infarction 1994  . Gout     USUALLY IN THE FEET  . BPH (benign prostatic hypertrophy)   . GERD (gastroesophageal reflux disease)     ROLAIDS IF NEEDED  . 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 Tooth disease     PT STATES HE IS SCHEDULED TO HAVE BAD TOOTH PULLED 03/24/13 - WILL CHECK WITH DR. Wynelle Link TO MAKE SURE THIS IS OK TO DO BEFORE HIS PLANNED KNEE REPLACEMENT ON 5/5.  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 Chronic kidney disease stage 3   . Hypertensive heart disease   . Paroxysmal atrial fibrillation 04/01/2013  . Osteoarthritis     Past Surgical History  Procedure Laterality Date  . Mastoid surgery x 5    . Prostate surgery    . Hernia repair  12/26/2002    BILATERAL INGUINAL HERNIA REPAIR  . Cardiac catheterization  07/21/2005  .  Total knee arthroplasty Left 03/28/2013    Procedure: LEFT TOTAL KNEE ARTHROPLASTY;  Surgeon: Gearlean Alf, MD;  Location: [facility];  Service: Orthopedics;  Laterality: Left;    Family History  Problem Relation Age of Onset  . Hypertension Mother   . Hyperlipidemia Mother   . Hypertension Father    Social History:  reports that he has quit smoking. He has never used smokeless tobacco. He reports that he does not drink alcohol or use illicit drugs.  Allergies: No Known Allergies  Medications:  No current facility-administered medications for this encounter.   Current Outpatient Prescriptions  Medication Sig Dispense Refill  . acetaminophen (TYLENOL) 500 MG tablet Take 1,000 mg by mouth every 6 (six) hours as needed for pain.    Marland Kitchen alum & mag hydroxide-simeth (MAALOX/MYLANTA) 200-200-20 MG/5ML suspension Take 30 mLs by mouth every 6 (six) hours as needed. 355 mL 0  . amoxicillin-clavulanate (AUGMENTIN) 500-125 MG per tablet Take 1 tablet (500 mg total) by mouth 2 (two) times daily. 20 tablet 0  . aspirin EC 81 MG tablet Take 81 mg by mouth daily.    Marland Kitchen atenolol (TENORMIN) 25 MG tablet Take 1 tablet (25 mg total) by mouth 2 (two) times daily.    . bisacodyl (DULCOLAX) 10 MG suppository Place 1 suppository (10 mg total) rectally daily as needed. 12 suppository 0  . Ca Carbonate-Mag Hydroxide (ROLAIDS PO) Take by mouth. PRN    . diphenhydrAMINE (BENADRYL) 12.5 MG/5ML elixir Take 5-10 mLs (12.5-25 mg total) by mouth every 4 (four) hours as needed for itching. 120 mL 0  . docusate sodium 100 MG CAPS Take 100 mg by mouth 2 (two) times daily. 60 capsule 0  . furosemide (LASIX) 20 MG tablet Starting 4/13, One tablet daily for 3 days and then stop.    . methocarbamol (ROBAXIN) 500 MG tablet Take 1 tablet (500 mg total) by mouth every 6 (six) hours as needed. 80 tablet 0  .  metoCLOPramide (REGLAN) 5 MG tablet Take 1-2 tablets (5-10 mg total) by mouth every 8 (eight) hours as needed (if ondansetron (ZOFRAN) ineffective.). 40 tablet 0  . nitroGLYCERIN (NITROSTAT) 0.4 MG SL tablet Place 0.4 mg under the tongue every 5 (five) minutes as needed for chest pain.    Marland Kitchen ondansetron (ZOFRAN) 4 MG tablet Take 1 tablet (4 mg total) by mouth every 6 (six) hours as needed for nausea. 40 tablet 0  . polyethylene glycol (MIRALAX / GLYCOLAX) packet Take 17 g by mouth daily. 14 each 0  . polyvinyl alcohol (LIQUIFILM TEARS) 1.4 % ophthalmic solution Place 2 drops into both eyes as needed. 15 mL   . rosuvastatin (CRESTOR) 20 MG tablet Take 20 mg by mouth every other day.    . sodium phosphate (FLEET) 7-19 GM/118ML ENEM Place 1 enema rectally daily as needed (for severe constipation).    . tamsulosin (FLOMAX) 0.4 MG CAPS Take 2 capsules (0.8 mg total) by mouth daily after supper. 30 capsule   . traMADol (ULTRAM) 50 MG tablet Take 1-2 tablets (50-100 mg total) by mouth every 6 (six) hours as needed (mild pain). 60 tablet 0  . warfarin (COUMADIN) 2.5 MG tablet Take 1 tablet (2.5 mg total) by mouth daily.       ROS:  History obtained from the patient  General ROS: negative for - chills, fatigue, fever, night sweats, weight gain or weight loss Psychological ROS: negative for - behavioral disorder, hallucinations, memory difficulties, mood swings or suicidal ideation Ophthalmic ROS: negative for - blurry vision, double vision, eye pain or loss of vision ENT ROS: negative for - epistaxis, nasal discharge, oral lesions, sore throat, tinnitus or vertigo Allergy and Immunology ROS: negative for - hives or itchy/watery eyes Hematological and Lymphatic ROS: negative for - bleeding problems, bruising or swollen lymph nodes Endocrine ROS: negative for - galactorrhea,  hair pattern changes, polydipsia/polyuria or temperature intolerance Respiratory ROS: negative for - cough, hemoptysis, shortness of breath or wheezing Cardiovascular ROS: negative for - chest pain, dyspnea on exertion, edema or irregular heartbeat Gastrointestinal ROS: negative for - abdominal pain, diarrhea, hematemesis, nausea/vomiting or stool incontinence Genito-Urinary ROS: negative for - dysuria, hematuria, incontinence or urinary frequency/urgency Musculoskeletal ROS: negative for - joint swelling or muscular weakness Neurological ROS: as noted in HPI Dermatological ROS: negative for rash and skin lesion changes  Neurologic Examination:                                                                                                      Blood pressure 144/43, pulse 53, temperature 98.7 F (37.1 C), temperature source Oral, resp. rate 20, height 5\' 7"  (1.702 m), weight 81.647 kg (180 lb), SpO2 97 %.  HEENT-  Normocephalic, no lesions, without obvious abnormality.  Normal external eye and conjunctiva.  Normal TM's bilaterally.  Normal auditory canals and external ears. Normal external nose, mucus membranes and septum.  Normal pharynx. Cardiovascular- irregularly irregular rhythm, pulses palpable throughout   Lungs- chest clear, no wheezing, rales, normal symmetric air entry Abdomen- normal findings: bowel sounds normal Extremities- no edema Lymph-no adenopathy palpable Musculoskeletal-no joint tenderness, deformity or swelling Skin-warm and dry, no hyperpigmentation, vitiligo, or suspicious lesions  Neurological Examination Mental Status: Alert, oriented, thought content appropriate.  Speech dysarthric without evidence of aphasia.  Able to follow 3 step commands without difficulty. Cranial Nerves: II: Discs flat bilaterally; Visual fields grossly normal, pupils equal, round, reactive to light and accommodation III,IV, VI: ptosis not present, extra-ocular motions intact  bilaterally V,VII: smile symmetric, facial light touch sensation normal bilaterally VIII: hearing normal bilaterally IX,X: uvula rises symmetrically XI: bilateral shoulder shrug XII: midline tongue extension Motor: Right : Upper extremity   5/5    Left:     Upper extremity   4/5  Lower extremity   5/5     Lower extremity   4/5 --drift on Left UE and LE Tone and bulk:normal tone throughout; no atrophy noted Sensory: Pinprick and light touch decreased on the left leg Deep Tendon Reflexes: 2+ and symmetric throughout UE and right KJ no left KJ no AJ bilaterally Plantars:  mute bilaterally Cerebellar: Dysmetric on the left arm and dysmetria versus weakness on left leg.  Gait: not tested due to safety.        Lab Results: Basic Metabolic Panel:  Recent Labs Lab 02/27/15 1210 02/27/15 1219  NA 138 139  K 4.0 4.1  CL 108 105  CO2 24  --   GLUCOSE 88 87  BUN 24* 27*  CREATININE 1.59* 1.50*  CALCIUM 8.5  --     Liver Function Tests:  Recent Labs Lab 02/27/15 1210  AST 15  ALT 16  ALKPHOS 75  BILITOT 1.1  PROT 5.8*  ALBUMIN 3.0*   No results for input(s): LIPASE, AMYLASE in the last 168 hours. No results for input(s): AMMONIA in the last 168 hours.  CBC:  Recent Labs Lab 02/27/15 1210 02/27/15 1219  WBC 10.4  --   NEUTROABS 8.2*  --   HGB 17.3* 16.7  HCT 49.0 49.0  MCV 97.0  --   PLT 100*  --     Cardiac Enzymes: No results for input(s): CKTOTAL, CKMB, CKMBINDEX, TROPONINI in the last 168 hours.  Lipid Panel: No results for input(s): CHOL, TRIG, HDL, CHOLHDL, VLDL, LDLCALC in the last 168 hours.  CBG:  Recent Labs Lab 02/27/15 1206  GLUCAP 75    Microbiology: Results for orders placed or performed during the hospital encounter of 03/28/13  Urine culture     Status: None   Collection Time: 04/01/13  1:34 AM  Result Value Ref Range Status   Specimen Description URINE, CLEAN CATCH  Final   Special Requests NONE  Final   Culture  Setup Time  04/01/2013 13:31  Final   Colony Count 95,000 COLONIES/ML  Final   Culture ENTEROCOCCUS SPECIES  Final   Report Status 04/03/2013 FINAL  Final   Organism ID, Bacteria ENTEROCOCCUS SPECIES  Final      Susceptibility   Enterococcus species - MIC*    AMPICILLIN <=2 SENSITIVE Sensitive     LEVOFLOXACIN 1 SENSITIVE Sensitive     NITROFURANTOIN <=16 SENSITIVE Sensitive     VANCOMYCIN 2 SENSITIVE Sensitive     TETRACYCLINE >=16 RESISTANT Resistant     * ENTEROCOCCUS SPECIES    Coagulation Studies:  Recent Labs  02/27/15 1210  LABPROT 17.6*  INR 1.44    Imaging: Ct Head (brain) Wo Contrast  02/27/2015   CLINICAL DATA:  Increase left-sided weakness and facial droop. Patient with documented pontine infarct on the right on recent MR.  EXAM: CT HEAD WITHOUT CONTRAST  TECHNIQUE: Contiguous axial images were obtained from the base of the skull through the vertex without intravenous contrast.  COMPARISON:  Brain MRI February 22, 2015  FINDINGS: Mild diffuse atrophy is stable. There is no intracranial mass, hemorrhage, extra-axial fluid collection, or midline shift. The recent infarct in the medial right pons is seen on CT than does not appear significantly changed compared to recent MR study. There is mild small vessel disease in the centra semiovale bilaterally. No new gray-white compartment lesion is identified. No acute infarct in addition to the acute infarct in the right pons noted on recent MR is seen. The bony calvarium appears intact. The patient has had mastoidectomies bilaterally.  IMPRESSION: Stable recent infarct medial right pons. Stable atrophy with mild periventricular small vessel disease. No new infarct identified compared to recent MR study. No hemorrhage, mass effect, or extra-axial fluid collection. Status post bilateral mastoidectomies.   Electronically Signed   By: Lowella Grip III M.D.   On: 02/27/2015 14:24       Assessment and plan discussed with with attending physician  and they are in agreement.    Etta Quill PA-C [facility] [phone]  02/27/2015, 3:13 PM     Stroke Risk Factors - atrial fibrillation, hyperlipidemia  and hypertension   Assessment: 81 y.o. male with subacute stroke. With worsening, would perform repeat MRI to exclude separate stroke. Also, this could be embolic, but also could be thrombotic. Will need stroke workup.   1. HgbA1c, fasting lipid panel 2. MRI, MRA  of the brain without contrast, MRA neck w contrast 3. Frequent neuro checks 4. Echocardiogram 5. Carotid dopplers are not needed given MRA neck 6. Prophylactic therapy-Antiplatelet med: Aspirin - dose 325mg  PO or 300mg  PR  7. Risk factor modification 8. Telemetry monitoring 9. PT consult, OT consult, Speech consult  Roland Rack, MD [facility] [phone]  If 7pm- 7am, please page neurology on call as listed in [place].

## 2015-02-27 NOTE — Progress Notes (Signed)
Pt admitted to 4N26.  Daughters at bedside. Pt alert and oriented. Denies pain.  Pt placed on tele and called.  Bed alarm on, call bell within reach and patient understands to call for assistance.  Pt has glasses and one hearing aid with him (rt).  Will continue to monitor.

## 2015-02-27 NOTE — ED Notes (Signed)
Pt c/o increased left sided weakness and facial droop with some drooling and incontinence of urine; pt told he had a stroke last Friday; pt noticed to be worse 1 days ago

## 2015-02-27 NOTE — H&P (Signed)
[facility] History and Physical  Stanley Robles TMA:[id number] DOB: 12/31/1931 DOA: 02/27/2015  Referring physician: EDP PCP: Stanley Robles., MD   Chief Complaint: Left-sided weakness  HPI: Stanley Robles is a 81 y.o. male with past medical history of HTN, CAD with acute MI 3 in the past and dyslipidemia came into the hospital complaining about worsening of recent left-sided weakness. History was taken from patient and has 2 daughters at bedside. Patient started to have symptoms last Wednesday with a slurred speech and left-sided weakness, if seen his PCP on Thursday and MRI was done and showed a right pontine stroke, because of minimal neurological deficits. Decision was made to do the workup as outpatient. Apparently in the interim patient was getting worse so his daughter contacted Dr. Woody Seller and he recommended them to go to the hospital. Per patient is having more troubles with the left upper lower extremities, more problems with swallowing as he developed a cough and choking while eating and drinking. In the ED previous MRI from [facility] done on Thursday the 31st showed right pontine stroke, CT scan of the head was repeated and showed recent infarct but no new infarct compared to the MRI. Patient denies any other complaints. Admitted to the hospital for further neurological workup.  Review of Systems:  Constitutional: negative for anorexia, fevers and sweats Eyes: negative for irritation, redness and visual disturbance Ears, nose, mouth, throat, and face: negative for earaches, epistaxis, nasal congestion and sore throat Respiratory: negative for cough, dyspnea on exertion, sputum and wheezing Cardiovascular: negative for chest pain, dyspnea, lower extremity edema, orthopnea, palpitations and syncope Gastrointestinal: negative for abdominal pain, constipation, diarrhea, melena, nausea and vomiting Genitourinary:negative for dysuria, frequency and hematuria Hematologic/lymphatic:  negative for bleeding, easy bruising and lymphadenopathy Musculoskeletal:negative for arthralgias, muscle weakness and stiff joints Neurological: Per HPI Endocrine: negative for diabetic symptoms including polydipsia, polyuria and weight loss Allergic/Immunologic: negative for anaphylaxis, hay fever and urticaria  Past Medical History  Diagnosis Date  . Hyperlipidemia   . Peripheral vascular disease   . Coronary artery disease     DR. TILLEY IS PT'S CARDIOLGIST  . Myocardial infarction 1994  . Gout     USUALLY IN THE FEET  . BPH (benign prostatic hypertrophy)   . GERD (gastroesophageal reflux disease)     ROLAIDS IF NEEDED  . 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 Tooth disease     PT STATES HE IS SCHEDULED TO HAVE BAD TOOTH PULLED 03/24/13 - WILL CHECK WITH DR. Wynelle Link TO MAKE SURE THIS IS OK TO DO BEFORE HIS PLANNED KNEE REPLACEMENT ON 5/5.  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 Chronic kidney disease stage 3   . Hypertensive heart disease   . Paroxysmal atrial fibrillation 04/01/2013  . Osteoarthritis    Past Surgical History  Procedure Laterality Date  . Mastoid surgery x 5    . Prostate surgery    . Hernia repair  12/26/2002    BILATERAL INGUINAL HERNIA REPAIR  . Cardiac catheterization  07/21/2005  . Total knee arthroplasty Left 03/28/2013    Procedure: LEFT TOTAL KNEE ARTHROPLASTY;  Surgeon: Gearlean Alf, MD;  Location: [facility];  Service: Orthopedics;  Laterality: Left;   Social History:   reports that he has quit smoking. He has never used smokeless tobacco. He reports that he does not drink alcohol or use illicit drugs.  No Known  Allergies  Family History  Problem Relation Age of Onset  . Hypertension Mother   . Hyperlipidemia Mother   . Hypertension Father     Prior to Admission medications   Medication Sig Start Date End Date Taking? Authorizing Provider    acetaminophen (TYLENOL) 500 MG tablet Take 1,000 mg by mouth every 6 (six) hours as needed for pain.    Historical Provider, MD  alum & mag hydroxide-simeth (MAALOX/MYLANTA) 200-200-20 MG/5ML suspension Take 30 mLs by mouth every 6 (six) hours as needed. 03/31/13   Arlee Muslim, PA-C  amoxicillin-clavulanate (AUGMENTIN) 500-125 MG per tablet Take 1 tablet (500 mg total) by mouth 2 (two) times daily. 04/04/13   Arlee Muslim, PA-C  aspirin EC 81 MG tablet Take 81 mg by mouth daily.    Historical Provider, MD  atenolol (TENORMIN) 25 MG tablet Take 1 tablet (25 mg total) by mouth 2 (two) times daily. 04/04/13   Bonnielee Haff, MD  bisacodyl (DULCOLAX) 10 MG suppository Place 1 suppository (10 mg total) rectally daily as needed. 03/31/13   Arlee Muslim, PA-C  Ca Carbonate-Mag Hydroxide (ROLAIDS PO) Take by mouth. PRN    Historical Provider, MD  diphenhydrAMINE (BENADRYL) 12.5 MG/5ML elixir Take 5-10 mLs (12.5-25 mg total) by mouth every 4 (four) hours as needed for itching. 03/31/13   Arlee Muslim, PA-C  docusate sodium 100 MG CAPS Take 100 mg by mouth 2 (two) times daily. 03/31/13   Arlee Muslim, PA-C  furosemide (LASIX) 20 MG tablet Starting 4/13, One tablet daily for 3 days and then stop. 04/04/13   Bonnielee Haff, MD  methocarbamol (ROBAXIN) 500 MG tablet Take 1 tablet (500 mg total) by mouth every 6 (six) hours as needed. 03/31/13   Arlee Muslim, PA-C  metoCLOPramide (REGLAN) 5 MG tablet Take 1-2 tablets (5-10 mg total) by mouth every 8 (eight) hours as needed (if ondansetron (ZOFRAN) ineffective.). 03/31/13   Arlee Muslim, PA-C  nitroGLYCERIN (NITROSTAT) 0.4 MG SL tablet Place 0.4 mg under the tongue every 5 (five) minutes as needed for chest pain.    Historical Provider, MD  ondansetron (ZOFRAN) 4 MG tablet Take 1 tablet (4 mg total) by mouth every 6 (six) hours as needed for nausea. 03/31/13   Arlee Muslim, PA-C  polyethylene glycol (MIRALAX / GLYCOLAX) packet Take 17 g by mouth daily. 04/04/13   Bonnielee Haff, MD   polyvinyl alcohol (LIQUIFILM TEARS) 1.4 % ophthalmic solution Place 2 drops into both eyes as needed. 04/04/13   Bonnielee Haff, MD  rosuvastatin (CRESTOR) 20 MG tablet Take 20 mg by mouth every other day.    Historical Provider, MD  sodium phosphate (FLEET) 7-19 GM/118ML ENEM Place 1 enema rectally daily as needed (for severe constipation). 04/04/13   Bonnielee Haff, MD  tamsulosin (FLOMAX) 0.4 MG CAPS Take 2 capsules (0.8 mg total) by mouth daily after supper. 04/04/13   Bonnielee Haff, MD  traMADol (ULTRAM) 50 MG tablet Take 1-2 tablets (50-100 mg total) by mouth every 6 (six) hours as needed (mild pain). 03/31/13   Arlee Muslim, PA-C  warfarin (COUMADIN) 2.5 MG tablet Take 1 tablet (2.5 mg total) by mouth daily. 04/04/13   Bonnielee Haff, MD   Physical Exam: Filed Vitals:   02/27/15 1500  BP: 160/51  Pulse: 53  Temp:   Resp: 18   Constitutional: Oriented to person, place, and time. Well-developed and well-nourished. Cooperative.  Head: Normocephalic and atraumatic.  Nose: Nose normal.  Mouth/Throat: Uvula is midline, oropharynx is clear and moist and mucous membranes  are normal.  Eyes: Conjunctivae and EOM are normal. Pupils are equal, round, and reactive to light.  Neck: Trachea normal and normal range of motion. Neck supple.  Cardiovascular: Normal rate, regular rhythm, S1 normal, S2 normal, normal heart sounds and intact distal pulses.   Pulmonary/Chest: Effort normal and breath sounds normal.  Abdominal: Soft. Bowel sounds are normal. There is no hepatosplenomegaly. There is no tenderness.  Musculoskeletal: Normal range of motion.  Neurological: Alert and oriented to person, place, and time. Has normal strength. No cranial nerve deficit or sensory deficit.  Skin: Skin is warm, dry and intact.  Psychiatric: Has a normal mood and affect. Speech is normal and behavior is normal.   Labs on Admission:  Basic Metabolic Panel:  Recent Labs Lab 02/27/15 1210 02/27/15 1219  NA 138 139   K 4.0 4.1  CL 108 105  CO2 24  --   GLUCOSE 88 87  BUN 24* 27*  CREATININE 1.59* 1.50*  CALCIUM 8.5  --    Liver Function Tests:  Recent Labs Lab 02/27/15 1210  AST 15  ALT 16  ALKPHOS 75  BILITOT 1.1  PROT 5.8*  ALBUMIN 3.0*   No results for input(s): LIPASE, AMYLASE in the last 168 hours. No results for input(s): AMMONIA in the last 168 hours. CBC:  Recent Labs Lab 02/27/15 1210 02/27/15 1219  WBC 10.4  --   NEUTROABS 8.2*  --   HGB 17.3* 16.7  HCT 49.0 49.0  MCV 97.0  --   PLT 100*  --    Cardiac Enzymes: No results for input(s): CKTOTAL, CKMB, CKMBINDEX, TROPONINI in the last 168 hours.  BNP (last 3 results) No results for input(s): BNP in the last 8760 hours.  ProBNP (last 3 results) No results for input(s): PROBNP in the last 8760 hours.  CBG:  Recent Labs Lab 02/27/15 1206  GLUCAP 75    Radiological Exams on Admission: Ct Head (brain) Wo Contrast  02/27/2015   CLINICAL DATA:  Increase left-sided weakness and facial droop. Patient with documented pontine infarct on the right on recent MR.  EXAM: CT HEAD WITHOUT CONTRAST  TECHNIQUE: Contiguous axial images were obtained from the base of the skull through the vertex without intravenous contrast.  COMPARISON:  Brain MRI February 22, 2015  FINDINGS: Mild diffuse atrophy is stable. There is no intracranial mass, hemorrhage, extra-axial fluid collection, or midline shift. The recent infarct in the medial right pons is seen on CT than does not appear significantly changed compared to recent MR study. There is mild small vessel disease in the centra semiovale bilaterally. No new gray-white compartment lesion is identified. No acute infarct in addition to the acute infarct in the right pons noted on recent MR is seen. The bony calvarium appears intact. The patient has had mastoidectomies bilaterally.  IMPRESSION: Stable recent infarct medial right pons. Stable atrophy with mild periventricular small vessel disease. No  new infarct identified compared to recent MR study. No hemorrhage, mass effect, or extra-axial fluid collection. Status post bilateral mastoidectomies.   Electronically Signed   By: Lowella Grip III M.D.   On: 02/27/2015 14:24    EKG: Independently reviewed. Sinus brady, with PAC.  Assessment/Plan Principal Problem:   Right pontine stroke Active Problems:   Paroxysmal atrial fibrillation   Long-term (current) use of anticoagulants   Chronic kidney disease stage 3   CVA (cerebral infarction)   Left-sided weakness     Right pontine stroke Patient presented with left-sided weakness, started initially  last Wednesday. Recent worsening of the left-sided weakness along with slurred speech and dysphagia. Neurology consulted, previous MRI from [facility] reviewed. CT scan done showed no acute stroke, repeat MRI head and MRA of the head and neck vessels. Subtherapeutic INR, currently on rectal aspirin await stroke team recommendation for anticoagulation. Per family INR was supratherapeutic (around 4) last Thursday and there were told to hold Coumadin. PT/OT/SLP, telemetry, 2-D echo, A1c, FLP.  Dysphagia Slurred speech and dysphagia likely from the stroke, patient coughs and chokes with swallowing. Strict nothing by mouth, even for medications. SLP to evaluate and treat.  Paroxysmal atrial fibrillation Currently sinus rhythm, upon records reviewed patient had this multiple times before he is on Coumadin. As mentioned above recently INR was supratherapeutic, Coumadin held.  Hypertension Avoid sudden drop in pressure, metoprolol as needed for systolic blood pressure more than 170.  CKD stage III Patient is around his baseline of creatinine of 1.5. Check BMP in a.m.   Code Status: full code  Family Communication:  plan discussed with the patient in presence of 2 daughters at bedside.  Disposition Plan:  Neurotelemetry, inpatient   Time spent: 70 minutes  Standley Bargo A,  MD [facility] Pager [phone]

## 2015-02-27 NOTE — ED Notes (Signed)
Neuro at bedside.

## 2015-02-27 NOTE — ED Provider Notes (Signed)
CSN: [id number]     Arrival date & time 02/27/15  1149 History   First MD Initiated Contact with Patient 02/27/15 1356     Chief Complaint  Patient presents with  . Weakness     (Consider location/radiation/quality/duration/timing/severity/associated sxs/prior Treatment) HPI The patient was diagnosed with a CVA 6 days ago at [facility]. He was discharged 2 days later and per his daughter had not completed the evaluation. The plan was to complete echo and other outpatient testing with his family physician. He was seen by his family physician today and found to be worse. His daughter reports she's had difficulty swallowing and has been choking on thin liquids over the weekend. As well it appears that he has had some increased dysfunction of his left arm and leg. Nonphysician advised him to the hospital for admission. Past Medical History  Diagnosis Date  . Hyperlipidemia   . Peripheral vascular disease   . Coronary artery disease     DR. TILLEY IS PT'S CARDIOLGIST  . Myocardial infarction 1994  . Gout     USUALLY IN THE FEET  . BPH (benign prostatic hypertrophy)   . GERD (gastroesophageal reflux disease)     ROLAIDS IF NEEDED  . 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 Tooth disease     PT STATES HE IS SCHEDULED TO HAVE BAD TOOTH PULLED 03/24/13 - WILL CHECK WITH DR. Wynelle Link TO MAKE SURE THIS IS OK TO DO BEFORE HIS PLANNED KNEE REPLACEMENT ON 5/5.  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 Chronic kidney disease stage 3   . Hypertensive heart disease   . Paroxysmal atrial fibrillation 04/01/2013  . Osteoarthritis    Past Surgical History  Procedure Laterality Date  . Mastoid surgery x 5    . Prostate surgery    . Hernia repair  12/26/2002    BILATERAL INGUINAL HERNIA REPAIR  . Cardiac catheterization  07/21/2005  . Total knee arthroplasty Left 03/28/2013    Procedure: LEFT TOTAL KNEE  ARTHROPLASTY;  Surgeon: Gearlean Alf, MD;  Location: [facility];  Service: Orthopedics;  Laterality: Left;   Family History  Problem Relation Age of Onset  . Hypertension Mother   . Hyperlipidemia Mother   . Hypertension Father    History  Substance Use Topics  . Smoking status: Former Research scientist (life sciences)  . Smokeless tobacco: Never Used     Comment: QUIT SMOKING PRIOR TO 1980  . Alcohol Use: No    Review of Systems  10 Systems reviewed and are negative for acute change except as noted in the HPI.   Allergies  Review of patient's allergies indicates no known allergies.  Home Medications   Prior to Admission medications   Medication Sig Start Date End Date Taking? Authorizing Provider  acetaminophen (TYLENOL) 500 MG tablet Take 1,000 mg by mouth every 6 (six) hours as needed for pain.   Yes Historical Provider, MD  allopurinol (ZYLOPRIM) 300 MG tablet Take 300 mg by mouth daily. 02/14/15  Yes Historical Provider, MD  atenolol (TENORMIN) 25 MG tablet Take 1 tablet (25 mg total) by mouth 2 (two) times daily. Patient taking differently: Take 25 mg by mouth daily.  04/04/13  Yes Bonnielee Haff, MD  colchicine 0.6 MG tablet Take 0.6 mg by mouth daily. 02/14/15  Yes Historical Provider, MD  docusate sodium (COLACE) 100 MG capsule Take 100 mg by mouth daily.  Yes Historical Provider, MD  docusate sodium 100 MG CAPS Take 100 mg by mouth 2 (two) times daily. 03/31/13  Yes Arlee Muslim, PA-C  DUREZOL 0.05 % EMUL  01/02/15  Yes Historical Provider, MD  finasteride (PROSCAR) 5 MG tablet Take 5 mg by mouth daily.   Yes Historical Provider, MD  lisinopril (PRINIVIL,ZESTRIL) 10 MG tablet Take 10 mg by mouth daily. 01/25/14  Yes Historical Provider, MD  lisinopril (PRINIVIL,ZESTRIL) 20 MG tablet Take 20 mg by mouth daily. 02/09/15  Yes Historical Provider, MD  omeprazole (PRILOSEC) 20 MG capsule Take 20 mg by mouth daily.   Yes Historical Provider, MD  polyvinyl alcohol (LIQUIFILM TEARS) 1.4 % ophthalmic solution  Place 2 drops into both eyes as needed. 04/04/13  Yes Bonnielee Haff, MD  rosuvastatin (CRESTOR) 20 MG tablet Take 20 mg by mouth every other day.   Yes Historical Provider, MD  warfarin (COUMADIN) 4 MG tablet Take 4 mg by mouth daily. 02/14/15  Yes Historical Provider, MD  warfarin (COUMADIN) 4 MG tablet Take 4 mg by mouth daily. 4 mg on Monday and all other days is 2 mg   Yes Historical Provider, MD  CRESTOR 40 MG tablet Take 40 mg by mouth daily. 02/14/15   Historical Provider, MD  gentamicin (GARAMYCIN) 0.3 % ophthalmic solution  01/02/15   Historical Provider, MD  ILEVRO 0.3 % ophthalmic suspension  01/02/15   Historical Provider, MD  nitroGLYCERIN (NITROSTAT) 0.4 MG SL tablet Place 0.4 mg under the tongue every 5 (five) minutes as needed for chest pain.    Historical Provider, MD   BP 146/51 mmHg  Pulse 123  Temp(Src) 97.9 F (36.6 C) (Oral)  Resp 16  Ht 5\' 7"  (1.702 m)  Wt 180 lb (81.647 kg)  BMI 28.19 kg/m2  SpO2 95% Physical Exam  Constitutional: He is oriented to person, place, and time. He appears well-developed and well-nourished.  HENT:  Head: Normocephalic and atraumatic.  Eyes: EOM are normal. Pupils are equal, round, and reactive to light.  Neck: Neck supple.  Cardiovascular: Normal rate, regular rhythm, normal heart sounds and intact distal pulses.   Pulmonary/Chest: Effort normal and breath sounds normal.  Abdominal: Soft. Bowel sounds are normal. He exhibits no distension. There is no tenderness.  Musculoskeletal: Normal range of motion. He exhibits no edema.  Neurological: He is alert and oriented to person, place, and time. He has normal strength. A cranial nerve deficit is present. He exhibits abnormal muscle tone. Coordination abnormal. GCS eye subscore is 4. GCS verbal subscore is 5. GCS motor subscore is 6.  The patient has diminished gag response. There is right-sided facial droop. The patient has 3 out of 4 strength on the left upper extremity and the left lower  extremity relative to the right.  Skin: Skin is warm, dry and intact.  Psychiatric: He has a normal mood and affect.    ED Course  Procedures (including critical care time) Labs Review Labs Reviewed  PROTIME-INR - Abnormal; Notable for the following:    Prothrombin Time 17.6 (*)    All other components within normal limits  CBC - Abnormal; Notable for the following:    Hemoglobin 17.3 (*)    MCH 34.3 (*)    Platelets 100 (*)    All other components within normal limits  DIFFERENTIAL - Abnormal; Notable for the following:    Neutrophils Relative % 79 (*)    Neutro Abs 8.2 (*)    All other components within normal limits  COMPREHENSIVE METABOLIC PANEL - Abnormal; Notable for the following:    BUN 24 (*)    Creatinine, Ser 1.59 (*)    Total Protein 5.8 (*)    Albumin 3.0 (*)    GFR calc non Af Amer 39 (*)    GFR calc Af Amer 46 (*)    All other components within normal limits  I-STAT CHEM 8, ED - Abnormal; Notable for the following:    BUN 27 (*)    Creatinine, Ser 1.50 (*)    Calcium, Ion 1.11 (*)    All other components within normal limits  APTT  HEMOGLOBIN A1C  LIPID PANEL  BASIC METABOLIC PANEL  CBC  CBG MONITORING, ED  I-STAT TROPOININ, ED    Imaging Review Ct Head (brain) Wo Contrast  02/27/2015   CLINICAL DATA:  Increase left-sided weakness and facial droop. Patient with documented pontine infarct on the right on recent MR.  EXAM: CT HEAD WITHOUT CONTRAST  TECHNIQUE: Contiguous axial images were obtained from the base of the skull through the vertex without intravenous contrast.  COMPARISON:  Brain MRI February 22, 2015  FINDINGS: Mild diffuse atrophy is stable. There is no intracranial mass, hemorrhage, extra-axial fluid collection, or midline shift. The recent infarct in the medial right pons is seen on CT than does not appear significantly changed compared to recent MR study. There is mild small vessel disease in the centra semiovale bilaterally. No new gray-white  compartment lesion is identified. No acute infarct in addition to the acute infarct in the right pons noted on recent MR is seen. The bony calvarium appears intact. The patient has had mastoidectomies bilaterally.  IMPRESSION: Stable recent infarct medial right pons. Stable atrophy with mild periventricular small vessel disease. No new infarct identified compared to recent MR study. No hemorrhage, mass effect, or extra-axial fluid collection. Status post bilateral mastoidectomies.   Electronically Signed   By: Lowella Grip III M.D.   On: 02/27/2015 14:24     EKG Interpretation None      MDM   Final diagnoses:  Cerebral infarction due to unspecified mechanism  Dysphasia due to recent stroke   Consultation was made to Dr. Leonel Ramsay of neurology. At this point the patient will be admitted to the hospitalist for further diagnostic workup of subacute CVA and therapeutic management.    Charlesetta Shanks, MD 02/27/15 [phone]

## 2015-02-28 ENCOUNTER — Inpatient Hospital Stay (HOSPITAL_COMMUNITY): Payer: Medicare Other

## 2015-02-28 ENCOUNTER — Encounter (HOSPITAL_COMMUNITY): Payer: Self-pay | Admitting: *Deleted

## 2015-02-28 DIAGNOSIS — N183 Chronic kidney disease, stage 3 (moderate): Secondary | ICD-10-CM

## 2015-02-28 DIAGNOSIS — G8190 Hemiplegia, unspecified affecting unspecified side: Secondary | ICD-10-CM

## 2015-02-28 DIAGNOSIS — I69321 Dysphasia following cerebral infarction: Secondary | ICD-10-CM

## 2015-02-28 DIAGNOSIS — I6350 Cerebral infarction due to unspecified occlusion or stenosis of unspecified cerebral artery: Secondary | ICD-10-CM

## 2015-02-28 DIAGNOSIS — I6340 Cerebral infarction due to embolism of unspecified cerebral artery: Secondary | ICD-10-CM

## 2015-02-28 DIAGNOSIS — R270 Ataxia, unspecified: Secondary | ICD-10-CM

## 2015-02-28 LAB — CBC
HCT: 47.2 % (ref 39.0–52.0)
Hemoglobin: 16.1 g/dL (ref 13.0–17.0)
MCH: 33.6 pg (ref 26.0–34.0)
MCHC: 34.1 g/dL (ref 30.0–36.0)
MCV: 98.5 fL (ref 78.0–100.0)
PLATELETS: 79 10*3/uL — AB (ref 150–400)
RBC: 4.79 MIL/uL (ref 4.22–5.81)
RDW: 14.1 % (ref 11.5–15.5)
WBC: 7.4 10*3/uL (ref 4.0–10.5)

## 2015-02-28 LAB — LIPID PANEL
CHOL/HDL RATIO: 5.2 ratio
CHOLESTEROL: 162 mg/dL (ref 0–200)
HDL: 31 mg/dL — ABNORMAL LOW (ref >39)
LDL CALC: 102 mg/dL — AB (ref 0–99)
Triglycerides: 145 mg/dL (ref <150)
VLDL: 29 mg/dL (ref 0–40)

## 2015-02-28 LAB — BASIC METABOLIC PANEL
Anion gap: 11 (ref 5–15)
BUN: 23 mg/dL (ref 6–23)
CHLORIDE: 108 mmol/L (ref 96–112)
CO2: 20 mmol/L (ref 19–32)
Calcium: 8.6 mg/dL (ref 8.4–10.5)
Creatinine, Ser: 1.51 mg/dL — ABNORMAL HIGH (ref 0.50–1.35)
GFR calc Af Amer: 49 mL/min — ABNORMAL LOW (ref >90)
GFR calc non Af Amer: 42 mL/min — ABNORMAL LOW (ref >90)
Glucose, Bld: 74 mg/dL (ref 70–99)
POTASSIUM: 4.2 mmol/L (ref 3.5–5.1)
Sodium: 139 mmol/L (ref 135–145)

## 2015-02-28 MED ORDER — APIXABAN 2.5 MG PO TABS
2.5000 mg | ORAL_TABLET | Freq: Two times a day (BID) | ORAL | Status: DC
  Administered 2015-02-28 – 2015-03-02 (×5): 2.5 mg via ORAL
  Filled 2015-02-28 (×5): qty 1

## 2015-02-28 MED ORDER — ROSUVASTATIN CALCIUM 20 MG PO TABS
20.0000 mg | ORAL_TABLET | Freq: Every day | ORAL | Status: DC
  Administered 2015-02-28 – 2015-03-01 (×2): 20 mg via ORAL
  Filled 2015-02-28 (×3): qty 1

## 2015-02-28 MED ORDER — PERFLUTREN LIPID MICROSPHERE
1.0000 mL | INTRAVENOUS | Status: AC | PRN
  Administered 2015-02-28: 6 mL via INTRAVENOUS
  Filled 2015-02-28: qty 10

## 2015-02-28 MED ORDER — GADOBENATE DIMEGLUMINE 529 MG/ML IV SOLN
10.0000 mL | Freq: Once | INTRAVENOUS | Status: AC | PRN
  Administered 2015-02-27: 10 mL via INTRAVENOUS

## 2015-02-28 MED ORDER — ATORVASTATIN CALCIUM 10 MG PO TABS
20.0000 mg | ORAL_TABLET | Freq: Every day | ORAL | Status: DC
  Filled 2015-02-28: qty 2

## 2015-02-28 MED ORDER — RESOURCE THICKENUP CLEAR PO POWD
ORAL | Status: DC | PRN
  Filled 2015-02-28: qty 125

## 2015-02-28 NOTE — Progress Notes (Signed)
Patient Demographics  Stanley Robles, is a 81 y.o. male, DOB - 11-23-1932, DZH:[id number]  Admit date - 02/27/2015   Admitting Physician Verlee Monte, MD  Outpatient Primary MD for the patient is VYAS,DHRUV B., MD  LOS - 1   Chief Complaint  Patient presents with  . Weakness        Subjective:   Stanley Robles today has, No headache, No chest pain, No abdominal pain - No Nausea, No new weakness tingling or numbness, No Cough - SOB.    Assessment & Plan    1. L-sided weakness with MRI evidence of subacute right paramedian infarct - likely occurred 3 days prior to admission, monitor on tele, obtain PT, OT, speech input, A1c , echogram, carotid duplex, neurology following. Switch to Eliquis. Statin added as LDL was above goal. Speech therapy following. Currently on dysphagia 3 diet with nectar thick liquids.   No results found for: HGBA1C  Lab Results  Component Value Date   CHOL 162 02/28/2015   HDL 31* 02/28/2015   LDLCALC 102* 02/28/2015   TRIG 145 02/28/2015   CHOLHDL 5.2 02/28/2015     2. Dyslipidemia. Placed on statin.   3.CKD 3 - at [facility].   4.HTN - permissive hypertension for #1 above.   5. Paroxysmal atrial fibrillation. Currently in sinus, Coumadin switch to Eliquis.      Code Status: Full  Family Communication: Daughter  Disposition Plan: SNF   Procedures   MRI/MRA brain, echogram, carotid duplex   Consults Neuro   Medications  Scheduled Meds: . apixaban  2.5 mg Oral BID   Continuous Infusions: . sodium chloride 75 mL/hr at 02/27/15 1823   PRN Meds:.metoprolol, RESOURCE THICKENUP CLEAR  DVT Prophylaxis  Eliquis  Lab Results  Component Value Date   PLT 79* 02/28/2015    Antibiotics     Anti-infectives    None          Objective:     Filed Vitals:   02/28/15 0100 02/28/15 0300 02/28/15 0500 02/28/15 1100  BP: 175/50 144/59 135/55 178/54  Pulse: 52 54 60 50  Temp: 97.9 F (36.6 C) 97.5 F (36.4 C) 97.6 F (36.4 C) 97.5 F (36.4 C)  TempSrc: Oral Oral Oral Oral  Resp: 16 14 18 20   Height:      Weight:      SpO2: 98% 95% 94% 95%    Wt Readings from Last 3 Encounters:  02/27/15 81.647 kg (180 lb)  04/04/13 79.2 kg (174 lb 9.7 oz)     Intake/Output Summary (Last 24 hours) at 02/28/15 1216 Last data filed at 02/27/15 2140  Gross per 24 hour  Intake      0 ml  Output    150 ml  Net   -150 ml     Physical Exam  Awake Alert, Oriented X 3, No new F.N deficits, Normal affect [place].AT,PERRAL Supple Neck,No JVD, No cervical lymphadenopathy appriciated.  Symmetrical Chest wall movement, Good air movement bilaterally, CTAB RRR,No Gallops,Rubs or new Murmurs, No Parasternal Heave +ve B.Sounds, Abd Soft, No tenderness, No organomegaly appriciated, No rebound - guarding or rigidity. No Cyanosis, Clubbing or edema, No new Rash or bruise      Data Review   Micro Results No  results found for this or any previous visit (from the past 240 hour(s)).  Radiology Reports Dg Chest 2 View  02/27/2015   CLINICAL DATA:  Cough and left-sided weakness.  EXAM: CHEST  2 VIEW  COMPARISON:  08/29/2014  FINDINGS: Shallow inspiration. Heart size and pulmonary vascularity are borderline but likely normal for technique. No focal airspace disease or consolidation in the lungs. No blunting of costophrenic angles. No pneumothorax. Calcification of the aorta. Degenerative changes in the spine.  IMPRESSION: No active cardiopulmonary disease.   Electronically Signed   By: Lucienne Capers M.D.   On: 02/27/2015 23:34   Ct Head (brain) Wo Contrast  02/27/2015   CLINICAL DATA:  Increase left-sided weakness and facial droop. Patient with documented pontine infarct on the right on recent MR.  EXAM: CT HEAD WITHOUT CONTRAST  TECHNIQUE: Contiguous  axial images were obtained from the base of the skull through the vertex without intravenous contrast.  COMPARISON:  Brain MRI February 22, 2015  FINDINGS: Mild diffuse atrophy is stable. There is no intracranial mass, hemorrhage, extra-axial fluid collection, or midline shift. The recent infarct in the medial right pons is seen on CT than does not appear significantly changed compared to recent MR study. There is mild small vessel disease in the centra semiovale bilaterally. No new gray-white compartment lesion is identified. No acute infarct in addition to the acute infarct in the right pons noted on recent MR is seen. The bony calvarium appears intact. The patient has had mastoidectomies bilaterally.  IMPRESSION: Stable recent infarct medial right pons. Stable atrophy with mild periventricular small vessel disease. No new infarct identified compared to recent MR study. No hemorrhage, mass effect, or extra-axial fluid collection. Status post bilateral mastoidectomies.   Electronically Signed   By: Lowella Grip III M.D.   On: 02/27/2015 14:24   Mr Angiogram Neck W Wo Contrast  02/28/2015   EXAM: MRI HEAD WITHOUT AND WITH CONTRAST AND MRA HEAD WITHOUT CONTRAST AND MRI NECK WITHOUT AND WITH CONTRAST  TECHNIQUE: Multiplanar, multiecho pulse sequences of the brain and surrounding structures were obtained without and with intravenous contrast. Angiographic images of the head were obtained using MRA technique without and with contrast. Multiplanar, multiecho pulse sequences of the neck and surrounding structures were obtained without and with intravenous contrast.  CONTRAST:  13mL MULTIHANCE GADOBENATE DIMEGLUMINE 529 MG/ML IV SOLN  COMPARISON:  None.  FINDINGS: MRI HEAD FINDINGS  There is persistent abnormal restricted diffusion in region of recently identified right pontine infarct. Overall, the size and distribution of this infarct is not significantly changed relative to prior study. The area of infarction  measures approximately 12 x 14 x 10 mm. Associated ADC signal abnormality has nearly normalized. Additionally, there is patchy post-contrast enhancement within this region (Series 17, image 19). Constellation of findings most compatible with a subacute infarct.  No other abnormal foci of restricted diffusion to suggest acute intracranial infarct identified. Gray-white matter differentiation otherwise maintained. Probable tiny remote lacunar infarct present within the right cerebellar hemisphere. Tiny remote lacunar infarcts also present within the thalami bilaterally. Tiny remote left pontine infarct also noted.  No mass lesion or midline shift. Very mild chronic small vessel ischemic changes noted. No acute or chronic intracranial hemorrhage. No hydrocephalus. No extra-axial fluid collection.  Craniocervical junction within normal limits. Pituitary gland unremarkable. No acute abnormality about the orbits. Sequelae of prior cataract extraction on the right. Very mild mucoperiosteal thickening present within the left maxillary sinus. Small Tornwaldt cyst again noted in  the nasopharynx. No mastoid effusion. Inner ear structures normal.  Degenerative changes noted within the upper cervical spine. Bone marrow signal intensity normal. Scalp soft tissues unremarkable.  No other abnormal enhancement within the brain.  MRA HEAD FINDINGS  ANTERIOR CIRCULATION:  Visualized distal portions of the cervical segments of the internal carotid arteries are widely patent bilaterally with antegrade flow. The petrous, cavernous, and supra clinoid segment of the left ICA are widely patent, although there is minimal atheromatous irregularity within the proximal cavernous segment. There is diffuse narrowing of the distal petrous, cavernous, and supra clinoid right ICA. This is likely related to a hypoplastic right A1 segment. The anterior cerebral arteries are supplied primarily via a widely patent dominant left A1 segment. The anterior  cerebral arteries themselves are widely patent and well opacified.  M1 segments patent bilaterally without focal stenosis or occlusion. MCA bifurcations within normal limits. There is distal branch atheromatous irregularity within the MCA branches bilaterally, right slightly worse than left.  POSTERIOR CIRCULATION:  Vertebral arteries are patent bilaterally to the level of the vertebrobasilar junction. There is atheromatous irregularity within the somewhat diminutive right vertebral artery with superimposed short segment severe stenosis. This is better seen on concomitant MRA of the neck (series 11607, image 5). The left vertebral artery is slightly dominant. Posterior inferior cerebellar arteries are patent proximally. There is mild multi focal atheromatous irregularity within the basilar artery without focal high-grade stenosis or occlusion. Superior cerebellar arteries patent proximally. P1 segments patent bilaterally. The left P1 segment is slightly hypoplastic with a prominent left posterior communicating artery. Distal branch atheromatous irregularity present within the PCA branches bilaterally. The distal left P2 segment is not well visualized, and may be occluded.  No aneurysm or vascular malformation.  MRI NECK FINDINGS  Visualized portions of the aortic arch are within normal limits with normal 3 vessel morphology. No high-grade stenosis seen at the origin of the great vessels. Visualized subclavian arteries are patent bilaterally.  The right common carotid artery is well opacified to the carotid bifurcation. There is focal atheromatous plaque within the proximal right ICA with associated short segment stenosis of approximately 30%. Distally, the right ICA is well opacified.  The left common carotid artery is widely patent to the carotid bifurcation, although there is multi focal irregular plaque within the distal left common carotid artery without focal stenosis. Atheromatous irregularity and plaque  present about the left carotid bifurcation, extending into the proximal left ICA. There is associated multi focal atheromatous irregularity within the proximal left ICA with superimposed stenoses of up to approximately 50%. The distal left ICA is opacified to the skullbase.  Both vertebral arteries arise from the subclavian arteries. Vertebral arteries are fairly well opacified along their entire course to the level of the dura without focal stenosis, dissection, or occlusion. There is a superimposed high-grade stenosis within the distal right V4 segment just prior to the vertebrobasilar junction.  IMPRESSION: MRI HEAD IMPRESSION:  1. Stable size and distribution of subacute right paramedian pontine infarct as detailed above. No hemorrhagic transformation. 2. No new intracranial infarct or other abnormality identified. 3. Chronic small vessel ischemic disease with multiple remote lacunar infarcts as above.  MRA HEAD IMPRESSION:  1. No proximal branch arterial occlusion identified within the intracranial circulation. 2. Focal high-grade stenosis within the distal right V4 segment just prior to the vertebrobasilar junction. This is best seen on concomitant neck MRA portion of this exam. 3. Diffuse irregular narrowing of the petrous, cavernous, and supra clinoid right  ICA. This is likely related to the hypoplastic right A1 segment, with the anterior cerebral arteries supplied mainly via the left internal carotid artery system. 4. Multi focal atheromatous irregularity within the distal MCA and PCA branches.  MRA NECK IMPRESSION:  1. Multi focal atheromatous irregularity within the proximal left ICA with associated multi focal stenoses of up to approximately 50%. 2. More mild atheromatous disease within the proximal right ICA with associated short segment stenosis of approximately 30%. 3. Short segment high-grade stenosis within the right V4 segment as above. Vertebral arteries otherwise widely patent.   Electronically  Signed   By: Jeannine Boga M.D.   On: 02/28/2015 01:49   Mr Jeri Cos [facility] Contrast  02/28/2015   EXAM: MRI HEAD WITHOUT AND WITH CONTRAST AND MRA HEAD WITHOUT CONTRAST AND MRI NECK WITHOUT AND WITH CONTRAST  TECHNIQUE: Multiplanar, multiecho pulse sequences of the brain and surrounding structures were obtained without and with intravenous contrast. Angiographic images of the head were obtained using MRA technique without and with contrast. Multiplanar, multiecho pulse sequences of the neck and surrounding structures were obtained without and with intravenous contrast.  CONTRAST:  73mL MULTIHANCE GADOBENATE DIMEGLUMINE 529 MG/ML IV SOLN  COMPARISON:  None.  FINDINGS: MRI HEAD FINDINGS  There is persistent abnormal restricted diffusion in region of recently identified right pontine infarct. Overall, the size and distribution of this infarct is not significantly changed relative to prior study. The area of infarction measures approximately 12 x 14 x 10 mm. Associated ADC signal abnormality has nearly normalized. Additionally, there is patchy post-contrast enhancement within this region (Series 17, image 19). Constellation of findings most compatible with a subacute infarct.  No other abnormal foci of restricted diffusion to suggest acute intracranial infarct identified. Gray-white matter differentiation otherwise maintained. Probable tiny remote lacunar infarct present within the right cerebellar hemisphere. Tiny remote lacunar infarcts also present within the thalami bilaterally. Tiny remote left pontine infarct also noted.  No mass lesion or midline shift. Very mild chronic small vessel ischemic changes noted. No acute or chronic intracranial hemorrhage. No hydrocephalus. No extra-axial fluid collection.  Craniocervical junction within normal limits. Pituitary gland unremarkable. No acute abnormality about the orbits. Sequelae of prior cataract extraction on the right. Very mild mucoperiosteal thickening  present within the left maxillary sinus. 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 No mastoid effusion. Inner ear structures normal.  Degenerative changes noted within the upper cervical spine. Bone marrow signal intensity normal. Scalp soft tissues unremarkable.  No other abnormal enhancement within the brain.  MRA HEAD FINDINGS  ANTERIOR CIRCULATION:  Visualized distal portions of the cervical segments of the internal carotid arteries are widely patent bilaterally with antegrade flow. The petrous, cavernous, and supra clinoid segment of the left ICA are widely patent, although there is minimal atheromatous irregularity within the proximal cavernous segment. There is diffuse narrowing of the distal petrous, cavernous, and supra clinoid right ICA. This is likely related to a hypoplastic right A1 segment. The anterior cerebral arteries are supplied primarily via a widely patent dominant left A1 segment. The anterior cerebral arteries themselves are widely patent and well opacified.  M1 segments patent bilaterally without focal stenosis or occlusion. MCA bifurcations within normal limits. There is distal branch atheromatous irregularity within the MCA branches bilaterally, right slightly worse than left.  POSTERIOR CIRCULATION:  Vertebral arteries are patent bilaterally to the level of the vertebrobasilar junction. There is atheromatous irregularity within the somewhat diminutive right vertebral artery with superimposed short segment severe  stenosis. This is better seen on concomitant MRA of the neck (series 11607, image 5). The left vertebral artery is slightly dominant. Posterior inferior cerebellar arteries are patent proximally. There is mild multi focal atheromatous irregularity within the basilar artery without focal high-grade stenosis or occlusion. Superior cerebellar arteries patent proximally. P1 segments patent bilaterally. The left P1 segment is slightly hypoplastic with a prominent left  posterior communicating artery. Distal branch atheromatous irregularity present within the PCA branches bilaterally. The distal left P2 segment is not well visualized, and may be occluded.  No aneurysm or vascular malformation.  MRI NECK FINDINGS  Visualized portions of the aortic arch are within normal limits with normal 3 vessel morphology. No high-grade stenosis seen at the origin of the great vessels. Visualized subclavian arteries are patent bilaterally.  The right common carotid artery is well opacified to the carotid bifurcation. There is focal atheromatous plaque within the proximal right ICA with associated short segment stenosis of approximately 30%. Distally, the right ICA is well opacified.  The left common carotid artery is widely patent to the carotid bifurcation, although there is multi focal irregular plaque within the distal left common carotid artery without focal stenosis. Atheromatous irregularity and plaque present about the left carotid bifurcation, extending into the proximal left ICA. There is associated multi focal atheromatous irregularity within the proximal left ICA with superimposed stenoses of up to approximately 50%. The distal left ICA is opacified to the skullbase.  Both vertebral arteries arise from the subclavian arteries. Vertebral arteries are fairly well opacified along their entire course to the level of the dura without focal stenosis, dissection, or occlusion. There is a superimposed high-grade stenosis within the distal right V4 segment just prior to the vertebrobasilar junction.  IMPRESSION: MRI HEAD IMPRESSION:  1. Stable size and distribution of subacute right paramedian pontine infarct as detailed above. No hemorrhagic transformation. 2. No new intracranial infarct or other abnormality identified. 3. Chronic small vessel ischemic disease with multiple remote lacunar infarcts as above.  MRA HEAD IMPRESSION:  1. No proximal branch arterial occlusion identified within the  intracranial circulation. 2. Focal high-grade stenosis within the distal right V4 segment just prior to the vertebrobasilar junction. This is best seen on concomitant neck MRA portion of this exam. 3. Diffuse irregular narrowing of the petrous, cavernous, and supra clinoid right ICA. This is likely related to the hypoplastic right A1 segment, with the anterior cerebral arteries supplied mainly via the left internal carotid artery system. 4. Multi focal atheromatous irregularity within the distal MCA and PCA branches.  MRA NECK IMPRESSION:  1. Multi focal atheromatous irregularity within the proximal left ICA with associated multi focal stenoses of up to approximately 50%. 2. More mild atheromatous disease within the proximal right ICA with associated short segment stenosis of approximately 30%. 3. Short segment high-grade stenosis within the right V4 segment as above. Vertebral arteries otherwise widely patent.   Electronically Signed   By: Jeannine Boga M.D.   On: 02/28/2015 01:49   Dg Swallowing Func-speech Pathology  02/28/2015    Objective Swallowing Evaluation:    Patient Details  Name: Stanley Robles MRN: [id number] Date of Birth: 1932-02-21  Today's Date: 02/28/2015 Time: SLP Start Time (ACUTE ONLY): 0955-SLP Stop Time (ACUTE ONLY): 1036 SLP Time Calculation (min) (ACUTE ONLY): 41 min  Past Medical History:  Past Medical History  Diagnosis Date  . Hyperlipidemia   . Peripheral vascular disease   . Coronary artery disease     DR. TILLEY IS PT'S  CARDIOLGIST  . Myocardial infarction 1994  . Gout     USUALLY IN THE FEET  . BPH (benign prostatic hypertrophy)   . GERD (gastroesophageal reflux disease)     ROLAIDS IF NEEDED  . 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 Tooth disease     PT STATES HE IS SCHEDULED TO HAVE BAD TOOTH PULLED 03/24/13 - WILL CHECK  WITH DR. Wynelle Link TO MAKE SURE THIS IS OK TO DO BEFORE HIS PLANNED KNEE  REPLACEMENT ON 5/5.  Marland Kitchen Personal history of transient ischemic  attack (TIA) and cerebral  infarction without residual deficit   . Chronic kidney disease stage 3   . Hypertensive heart disease   . Paroxysmal atrial fibrillation 04/01/2013  . Osteoarthritis    Past Surgical History:  Past Surgical History  Procedure Laterality Date  . Mastoid surgery x 5    . Prostate surgery    . Hernia repair  12/26/2002    BILATERAL INGUINAL HERNIA REPAIR  . Cardiac catheterization  07/21/2005  . Total knee arthroplasty Left 03/28/2013    Procedure: LEFT TOTAL KNEE ARTHROPLASTY;  Surgeon: Gearlean Alf, MD;   Location: [facility];  Service: Orthopedics;  Laterality: Left;   HPI:  HPI: 81 yo male adm with left sided weakness, worsening coughing with  po/dysphagia recently diagnosed with right pontine CVA.  PMH + for MI,  knee replacement.  Pt on blood thinner.  Swallow evaluation and speech  evaluation ordered.    No Data Recorded  Assessment / Plan / Recommendation CHL IP CLINICAL IMPRESSIONS 02/28/2015  Dysphagia Diagnosis Moderate oral phase dysphagia;Moderate pharyngeal  phase dysphagia  Clinical impression Moderate oropharyngeal dysphagia with sensorimotor  deficits resulting in delayed oral transiting, delayed pharyngeal swallow  response.  Gross aspiration of nectar liquid via cup noted due to delayed  swallow response - barium fills pyriform sinus and spilled into open  airway prior to swallow.  Trace aspiration of thin via straw with chin  tuck noted.  Pt did not clear aspirates even with strong reflexive  coughing.  Chin tuck posture effective to prevent aspiration with all  consistencies except straw boluses of thin.  Cued dry swallows helpful to  decreased oropharyngeal resiudals.   Recommend to initiate a dys3/nectar diet, allowing tsps of thin water  between meals after oral care with very strict aspiration precautions.        CHL IP TREATMENT RECOMMENDATION 02/28/2015  Treatment Plan Recommendations Therapy as outlined in treatment plan below      CHL IP DIET RECOMMENDATION 02/28/2015  Diet  Recommendations Dysphagia 3 (Mechanical Soft);Nectar-thick liquid  Liquid Administration via Cup;Straw  Medication Administration Crushed with puree  Compensations Multiple dry swallows after each bite/sip;Slow rate;Small  sips/bites;Check for pocketing  Postural Changes and/or Swallow Maneuvers Chin tuck     CHL IP OTHER RECOMMENDATIONS 02/28/2015  Recommended Consults MBS  Oral Care Recommendations Oral care Q4 per protocol  Other Recommendations Order thickener from pharmacy;Have oral suction  available     CHL IP FOLLOW UP RECOMMENDATIONS 02/28/2015  Follow up Recommendations Inpatient Rehab     CHL IP FREQUENCY AND DURATION 02/28/2015  Speech Therapy Frequency (ACUTE ONLY) min 2x/week  Treatment Duration 2 weeks         CHL IP REASON FOR REFERRAL 02/28/2015  Reason for Referral Objectively evaluate swallowing function     CHL IP ORAL PHASE 02/28/2015  Oral Phase Impaired  Oral - Honey Teaspoon Reduced posterior  propulsion  Oral - Nectar Teaspoon Reduced posterior propulsion;Delayed oral  transit;Weak lingual manipulation  Oral - Nectar Cup Reduced posterior propulsion;Delayed oral transit;Weak  lingual manipulation  Oral - Nectar Straw Reduced posterior propulsion;Delayed oral transit;Weak  lingual manipulation  Oral - Thin Cup Reduced posterior propulsion;Delayed oral transit;Weak  lingual manipulation  Oral - Thin Straw Reduced posterior propulsion;Delayed oral transit;Weak  lingual manipulation  Oral - Thin Syringe (None)  Oral - Puree Reduced posterior propulsion;Delayed oral transit;Weak  lingual manipulation  Oral - Mechanical Soft (None)  Oral - Regular Reduced posterior propulsion;Delayed oral transit;Weak  lingual manipulation      CHL IP PHARYNGEAL PHASE 02/28/2015  Pharyngeal Phase Impaired  Pharyngeal - Honey Teaspoon Delayed swallow initiation  Pharyngeal - Nectar Teaspoon Delayed swallow initiation  Penetration/Aspiration details (nectar teaspoon) (None)  Pharyngeal - Nectar Cup Delayed swallow  initiation;Premature spillage to  pyriform sinuses;Penetration/Aspiration before swallow;Significant  aspiration (Amount)  Penetration/Aspiration details (nectar cup) Material enters airway, passes  BELOW cords and not ejected out despite cough attempt by patient  Pharyngeal - Nectar Straw Delayed swallow initiation;Premature spillage to  valleculae;Premature spillage to pyriform sinuses  Pharyngeal - Thin Teaspoon Delayed swallow initiation;Premature spillage  to valleculae  Penetration/Aspiration details (thin teaspoon) (None)  Pharyngeal - Thin Cup Delayed swallow initiation;Premature spillage to  valleculae  Penetration/Aspiration details (thin cup) (None)  Pharyngeal - Thin Straw Delayed swallow initiation;Premature spillage to  valleculae;Premature spillage to pyriform sinuses  Penetration/Aspiration details (thin straw) Trace aspiration  Pharyngeal - Puree Delayed swallow initiation;Premature spillage to  valleculae  Pharyngeal - Regular Delayed swallow initiation;Premature spillage to  valleculae  Pharyngeal Comment mild amount of oral residuals prematurely spill into  pharynx without sensation and pt requiring verbal cues to swallow     CH[facility] IP CERVICAL ESOPHAGEAL PHASE 02/28/2015  Cervical Esophageal Phase [facility]    No flowsheet data found.         Luanna Salk, [facility] SLP [phone]    Mr Jodene Nam Head/brain Wo Cm  02/28/2015   EXAM: MRI HEAD WITHOUT AND WITH CONTRAST AND MRA HEAD WITHOUT CONTRAST AND MRI NECK WITHOUT AND WITH CONTRAST  TECHNIQUE: Multiplanar, multiecho pulse sequences of the brain and surrounding structures were obtained without and with intravenous contrast. Angiographic images of the head were obtained using MRA technique without and with contrast. Multiplanar, multiecho pulse sequences of the neck and surrounding structures were obtained without and with intravenous contrast.  CONTRAST:  73mL MULTIHANCE GADOBENATE DIMEGLUMINE 529 MG/ML IV SOLN  COMPARISON:  None.  FINDINGS: MRI HEAD FINDINGS   There is persistent abnormal restricted diffusion in region of recently identified right pontine infarct. Overall, the size and distribution of this infarct is not significantly changed relative to prior study. The area of infarction measures approximately 12 x 14 x 10 mm. Associated ADC signal abnormality has nearly normalized. Additionally, there is patchy post-contrast enhancement within this region (Series 17, image 19). Constellation of findings most compatible with a subacute infarct.  No other abnormal foci of restricted diffusion to suggest acute intracranial infarct identified. Gray-white matter differentiation otherwise maintained. Probable tiny remote lacunar infarct present within the right cerebellar hemisphere. Tiny remote lacunar infarcts also present within the thalami bilaterally. Tiny remote left pontine infarct also noted.  No mass lesion or midline shift. Very mild chronic small vessel ischemic changes noted. No acute or chronic intracranial hemorrhage. No hydrocephalus. No extra-axial fluid collection.  Craniocervical junction within normal limits. Pituitary gland unremarkable. No acute abnormality about the orbits. Sequelae of prior cataract  extraction on the right. Very mild mucoperiosteal thickening present within the left maxillary sinus. 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 No mastoid effusion. Inner ear structures normal.  Degenerative changes noted within the upper cervical spine. Bone marrow signal intensity normal. Scalp soft tissues unremarkable.  No other abnormal enhancement within the brain.  MRA HEAD FINDINGS  ANTERIOR CIRCULATION:  Visualized distal portions of the cervical segments of the internal carotid arteries are widely patent bilaterally with antegrade flow. The petrous, cavernous, and supra clinoid segment of the left ICA are widely patent, although there is minimal atheromatous irregularity within the proximal cavernous segment. There is diffuse narrowing  of the distal petrous, cavernous, and supra clinoid right ICA. This is likely related to a hypoplastic right A1 segment. The anterior cerebral arteries are supplied primarily via a widely patent dominant left A1 segment. The anterior cerebral arteries themselves are widely patent and well opacified.  M1 segments patent bilaterally without focal stenosis or occlusion. MCA bifurcations within normal limits. There is distal branch atheromatous irregularity within the MCA branches bilaterally, right slightly worse than left.  POSTERIOR CIRCULATION:  Vertebral arteries are patent bilaterally to the level of the vertebrobasilar junction. There is atheromatous irregularity within the somewhat diminutive right vertebral artery with superimposed short segment severe stenosis. This is better seen on concomitant MRA of the neck (series 11607, image 5). The left vertebral artery is slightly dominant. Posterior inferior cerebellar arteries are patent proximally. There is mild multi focal atheromatous irregularity within the basilar artery without focal high-grade stenosis or occlusion. Superior cerebellar arteries patent proximally. P1 segments patent bilaterally. The left P1 segment is slightly hypoplastic with a prominent left posterior communicating artery. Distal branch atheromatous irregularity present within the PCA branches bilaterally. The distal left P2 segment is not well visualized, and may be occluded.  No aneurysm or vascular malformation.  MRI NECK FINDINGS  Visualized portions of the aortic arch are within normal limits with normal 3 vessel morphology. No high-grade stenosis seen at the origin of the great vessels. Visualized subclavian arteries are patent bilaterally.  The right common carotid artery is well opacified to the carotid bifurcation. There is focal atheromatous plaque within the proximal right ICA with associated short segment stenosis of approximately 30%. Distally, the right ICA is well opacified.   The left common carotid artery is widely patent to the carotid bifurcation, although there is multi focal irregular plaque within the distal left common carotid artery without focal stenosis. Atheromatous irregularity and plaque present about the left carotid bifurcation, extending into the proximal left ICA. There is associated multi focal atheromatous irregularity within the proximal left ICA with superimposed stenoses of up to approximately 50%. The distal left ICA is opacified to the skullbase.  Both vertebral arteries arise from the subclavian arteries. Vertebral arteries are fairly well opacified along their entire course to the level of the dura without focal stenosis, dissection, or occlusion. There is a superimposed high-grade stenosis within the distal right V4 segment just prior to the vertebrobasilar junction.  IMPRESSION: MRI HEAD IMPRESSION:  1. Stable size and distribution of subacute right paramedian pontine infarct as detailed above. No hemorrhagic transformation. 2. No new intracranial infarct or other abnormality identified. 3. Chronic small vessel ischemic disease with multiple remote lacunar infarcts as above.  MRA HEAD IMPRESSION:  1. No proximal branch arterial occlusion identified within the intracranial circulation. 2. Focal high-grade stenosis within the distal right V4 segment just prior to the vertebrobasilar junction. This is best seen  on concomitant neck MRA portion of this exam. 3. Diffuse irregular narrowing of the petrous, cavernous, and supra clinoid right ICA. This is likely related to the hypoplastic right A1 segment, with the anterior cerebral arteries supplied mainly via the left internal carotid artery system. 4. Multi focal atheromatous irregularity within the distal MCA and PCA branches.  MRA NECK IMPRESSION:  1. Multi focal atheromatous irregularity within the proximal left ICA with associated multi focal stenoses of up to approximately 50%. 2. More mild atheromatous disease  within the proximal right ICA with associated short segment stenosis of approximately 30%. 3. Short segment high-grade stenosis within the right V4 segment as above. Vertebral arteries otherwise widely patent.   Electronically Signed   By: Jeannine Boga M.D.   On: 02/28/2015 01:49     CBC  Recent Labs Lab 02/27/15 1210 02/27/15 1219 02/28/15 0427  WBC 10.4  --  7.4  HGB 17.3* 16.7 16.1  HCT 49.0 49.0 47.2  PLT 100*  --  79*  MCV 97.0  --  98.5  MCH 34.3*  --  33.6  MCHC 35.3  --  34.1  RDW 14.3  --  14.1  LYMPHSABS 1.5  --   --   MONOABS 0.7  --   --   EOSABS 0.1  --   --   BASOSABS 0.0  --   --     Chemistries   Recent Labs Lab 02/27/15 1210 02/27/15 1219 02/28/15 0427  NA 138 139 139  K 4.0 4.1 4.2  CL 108 105 108  CO2 24  --  20  GLUCOSE 88 87 74  BUN 24* 27* 23  CREATININE 1.59* 1.50* 1.51*  CALCIUM 8.5  --  8.6  AST 15  --   --   ALT 16  --   --   ALKPHOS 75  --   --   BILITOT 1.1  --   --    ------------------------------------------------------------------------------------------------------------------ estimated creatinine clearance is 39.9 mL/min (by C-G formula based on Cr of 1.51). ------------------------------------------------------------------------------------------------------------------ No results for input(s): HGBA1C in the last 72 hours. ------------------------------------------------------------------------------------------------------------------  Recent Labs  02/28/15 0427  CHOL 162  HDL 31*  LDLCALC 102*  TRIG 145  CHOLHDL 5.2   ------------------------------------------------------------------------------------------------------------------ No results for input(s): TSH, T4TOTAL, T3FREE, THYROIDAB in the last 72 hours.  Invalid input(s): FREET3 ------------------------------------------------------------------------------------------------------------------ No results for input(s): VITAMINB12, FOLATE, FERRITIN, TIBC,  IRON, RETICCTPCT in the last 72 hours.  Coagulation profile  Recent Labs Lab 02/27/15 1210  INR 1.44    No results for input(s): DDIMER in the last 72 hours.  Cardiac Enzymes No results for input(s): CKMB, TROPONINI, MYOGLOBIN in the last 168 hours.  Invalid input(s): CK ------------------------------------------------------------------------------------------------------------------ Invalid input(s): POCBNP     Time Spent in minutes  35   Fairley Copher K M.D on 02/28/2015 at 12:16 PM  Between 7am to 7pm - Pager - [phone]  After 7pm go to [email or web address] - password [facility]  [facility]   Office  [phone]

## 2015-02-28 NOTE — Progress Notes (Signed)
CARE MANAGEMENT NOTE 02/28/2015  Patient:  Stanley Robles, Stanley Robles   Account Number:  [id number]  Date Initiated:  02/28/2015  Documentation initiated by:  Olga Coaster  Subjective/Objective Assessment:   ADMITTED WITH STROKE     Action/Plan:   CM FOLLOWING FOR DCP   Anticipated DC Date:  03/03/2015   Anticipated DC Plan:  POSSIBLE INPT REHAB     DC Planning Services  CM consult         Status of service:  In process, will continue to follow  Per UR Regulation:  Reviewed for med. necessity/level of care/duration of stay  Comments:  4/6/2016Mindi Slicker RN,BSN,MHA [phone]

## 2015-02-28 NOTE — Progress Notes (Signed)
Rehab Admissions Coordinator Note:  Patient was screened by Cleatrice Burke for appropriateness for an Inpatient Acute Rehab Consult per PT recommendation.   At this time, we are recommending Inpatient Rehab consult. Please place order.   Cleatrice Burke 02/28/2015, 11:19 AM  I can be reached at [phone].

## 2015-02-28 NOTE — Evaluation (Signed)
Speech Language Pathology Evaluation Patient Details Name: Stanley Robles MRN: [id number] DOB: 12-19-1931 Today's Date: 02/28/2015 Time: 5465-0354 SLP Time Calculation (min) (ACUTE ONLY): 16 min  Problem List:  Patient Active Problem List   Diagnosis Date Noted  . CVA (cerebral infarction) 02/27/2015  . Left-sided weakness 02/27/2015  . Right pontine stroke 02/27/2015  . Dysphagia due to recent cerebral infarction 02/27/2015  . Non-STEMI (non-ST elevated myocardial infarction) 04/04/2013  . Acute CHF (congestive heart failure) 04/04/2013  . Enterococcus UTI 04/04/2013  . BPH (benign prostatic hyperplasia) 04/03/2013  . Postop Hypokalemia 04/02/2013  . Atrial fibrillation with RVR 04/01/2013  . Nonspecific abnormal electrocardiogram (ECG) (EKG) 04/01/2013  . Unspecified constipation 04/01/2013  . CAD (coronary artery disease) 04/01/2013  . Paroxysmal atrial fibrillation 04/01/2013  . Hyperlipidemia   . Hypertensive heart disease   . Personal history of transient ischemic attack (TIA) and cerebral infarction without residual deficit   . Long-term (current) use of anticoagulants   . Chronic kidney disease stage 3   . Postoperative anemia due to acute blood loss 03/29/2013  . Postop Hyponatremia 03/29/2013  . OA (osteoarthritis) of knee 03/28/2013   Past Medical History:  Past Medical History  Diagnosis Date  . Hyperlipidemia   . Peripheral vascular disease   . Coronary artery disease     DR. TILLEY IS PT'S CARDIOLGIST  . Myocardial infarction 1994  . Gout     USUALLY IN THE FEET  . BPH (benign prostatic hypertrophy)   . GERD (gastroesophageal reflux disease)     ROLAIDS IF NEEDED  . 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 Tooth disease     PT STATES HE IS SCHEDULED TO HAVE BAD TOOTH PULLED 03/24/13 - WILL CHECK WITH DR. Wynelle Link TO MAKE SURE THIS IS OK TO DO BEFORE HIS PLANNED KNEE REPLACEMENT ON 5/5.  Marland Kitchen Personal history of transient ischemic attack  (TIA) and cerebral infarction without residual deficit   . Chronic kidney disease stage 3   . Hypertensive heart disease   . Paroxysmal atrial fibrillation 04/01/2013  . Osteoarthritis    Past Surgical History:  Past Surgical History  Procedure Laterality Date  . Mastoid surgery x 5    . Prostate surgery    . Hernia repair  12/26/2002    BILATERAL INGUINAL HERNIA REPAIR  . Cardiac catheterization  07/21/2005  . Total knee arthroplasty Left 03/28/2013    Procedure: LEFT TOTAL KNEE ARTHROPLASTY;  Surgeon: Gearlean Alf, MD;  Location: [facility]S;  Service: Orthopedics;  Laterality: Left;   HPI:  81 yo male adm with left sided weakness, worsening coughing with po/dysphagia recently diagnosed with right pontine CVA.  PMH + for MI, knee replacement.  Pt on blood thinner.  Swallow evaluation and speech evaluation ordered.     Assessment / Plan / Recommendation Clinical Impression  Pt presents with moderate flaccid dysarthria resulting in imprecise articulation/decreased intelligiblity with pt demonstrating awareness.  Pt is amenable to compensation strategies including slowing rate and ovearticulating to maxmize intellibility.  He was oriented x4 (minus exact date by 1), able to state full address and names of all his children.  He demonstrated good judgement by indicating understanding/agreeing to participating in MBS due to aspiration concerns that may contribute to pna.  Pt will benefit from follow up SlP to maximize speech/swallow skills.      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)  Overall Cognitive Status: Within Functional Limits for tasks assessed Arousal/Alertness: Awake/alert Orientation Level: Oriented X4 Attention: Sustained (selective difficult to test due to pt's hearing loss) Sustained Attention: Appears intact Memory: Appears intact (pt recalled prior medical events) Awareness: Appears intact Safety/Judgment: Appears intact (exhibits good judgement re: need for MBS due to asp risk) Comments: pt stopped driving approx one week ago after his CVA    Comprehension  Auditory Comprehension Overall Auditory Comprehension: Appears within functional limits for tasks assessed Yes/No Questions: Not tested Commands: Within Functional Limits (for basic) Conversation: Complex Interfering Components: Hearing EffectiveTechniques: Increased volume Visual Recognition/Discrimination Discrimination: Not tested Reading Comprehension Reading Status: Not tested    Expression Expression Primary Mode of Expression: Verbal Verbal Expression Overall Verbal Expression: Impaired Initiation: No impairment Level of Generative/Spontaneous Verbalization: Conversation Repetition:  (DNT) Naming: Not tested Pragmatics: No impairment Interfering Components: Speech intelligibility Written Expression Dominant Hand: Right Written Expression: Not tested   Oral / Motor Oral Motor/Sensory Function Overall Oral Motor/Sensory Function: Impaired Labial ROM: Reduced left Labial Symmetry: Abnormal symmetry left Labial Strength: Reduced Labial Sensation: Reduced Lingual ROM: Reduced left Lingual Strength: Reduced Lingual Sensation: Reduced Facial ROM: Reduced left Facial Sensation: Reduced Velum:  (sluggish) Mandible: Within Functional Limits Motor Speech Overall Motor Speech: Impaired Respiration: Impaired Level of Impairment: Sentence Phonation: Low vocal intensity Resonance: Within functional limits Articulation: Impaired Level  of Impairment: Sentence Intelligibility: Intelligibility reduced Word: 75-100% accurate Phrase: 75-100% accurate Sentence: 50-74% accurate Conversation: 50-74% accurate Motor Planning: Witnin functional limits Effective Techniques: Over-articulate;Slow rate   GO     Stanley Robles, [place] [facility]

## 2015-02-28 NOTE — Evaluation (Signed)
Physical Therapy Evaluation Patient Details Name: Stanley Robles MRN: [id number] DOB: 05-27-1932 Today's Date: 02/28/2015   History of Present Illness  Pt is an 81 yo male who was brought to ED for disathria and L sided weakness. 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  Clinical Impression  Pt indep and living alone PTA. Pt now presenting with balance impairment, co-ordination deficits, sequencing difficulty, L Ue/LE weakness and currently requires assist for all ADLs, transfers, and ambulation. Pt very motivated and is an excellent candidate for CIR to achieve maximal functional recovery and independence with mobility. Daughters are very supportive and can provide whatever assist/supervision pt needs post discharge.    Follow Up Recommendations CIR    Equipment Recommendations  None recommended by PT    Recommendations for Other Services Rehab consult     Precautions / Restrictions Precautions Precautions: Fall Restrictions Weight Bearing Restrictions: No      Mobility  Bed Mobility Overal bed mobility: Needs Assistance Bed Mobility: Rolling;Sidelying to Sit Rolling: Supervision (use of bed rail) Sidelying to sit: Min assist       General bed mobility comments: used bed rail HOB elevated, max directional cues, assist for balance and trunk elevation  Transfers Overall transfer level: Needs assistance Equipment used: 1 person hand held assist Transfers: Sit to/from Stand Sit to Stand: Mod assist         General transfer comment: pt slightly impulsive requiring max directional v/c's and tactile cues for safe management of L LE and stabilty once in standing due to L lateral lean and inability to maintain midline posture  Ambulation/Gait Ambulation/Gait assistance: Max assist;+2 physical assistance;+2 safety/equipment Ambulation Distance (Feet): 100 Feet Assistive device: 1 person hand held assist (proximal assist at pelvis) Gait Pattern/deviations: Decreased step length -  left;Decreased stride length;Decreased stance time - left;Staggering left;Antalgic;Step-through pattern Gait velocity: decreased   General Gait Details: pt with impaired co-ordination/sequencing of steps. pt with improved stepping pattern once max stabilization provided at hips to provide stability to allow pt to focus on L LE kinematics. pt with strong lean L.  Stairs            Wheelchair Mobility    Modified Rankin (Stroke Patients Only) Modified Rankin (Stroke Patients Only) Pre-Morbid Rankin Score: No symptoms Modified Rankin: Moderately severe disability     Balance Overall balance assessment: Needs assistance Sitting-balance support: No upper extremity supported;Feet supported Sitting balance-Leahy Scale: Fair     Standing balance support: No upper extremity supported Standing balance-Leahy Scale: Poor Standing balance comment: needs physical stability to to maintain balance to stand and use urinal                             Pertinent Vitals/Pain Pain Assessment: No/denies pain    Home Living Family/patient expects to be discharged to:: Inpatient rehab Living Arrangements: Alone               Additional Comments: daughter Stanley Robles present and reports her and her sisters can figure out how to provide 24/7 assist for patient    Prior Function Level of Independence: Independent         Comments: worked on cars in his shop, did grocery shopping     Hand Dominance   Dominant Hand: Right    Extremity/Trunk Assessment   Upper Extremity Assessment: LUE deficits/detail       LUE Deficits / Details: grossly 3-/5, except tricep 2+/5   Lower Extremity Assessment: LLE  deficits/detail   LLE Deficits / Details: grossly 3+/5 except hip flexion 3-/5  Cervical / Trunk Assessment: Normal  Communication   Communication: HOH  Cognition Arousal/Alertness: Awake/alert Behavior During Therapy: WFL for tasks assessed/performed Overall Cognitive  Status: Within Functional Limits for tasks assessed                      General Comments      Exercises        Assessment/Plan    PT Assessment Patient needs continued PT services  PT Diagnosis Difficulty walking;Abnormality of gait   PT Problem List Decreased strength;Decreased activity tolerance;Decreased range of motion;Decreased balance;Decreased mobility;Decreased coordination  PT Treatment Interventions DME instruction;Gait training;Stair training;Functional mobility training;Therapeutic activities;Therapeutic exercise   PT Goals (Current goals can be found in the Care Plan section) Acute Rehab PT Goals 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 PT Goal Formulation: With patient/family Time For Goal Achievement: 03/14/15 Potential to Achieve Goals: Good    Frequency Min 4X/week   Barriers to discharge Decreased caregiver support lives alone but daughters report they can provide 24/7 assist if needed    Co-evaluation               End of Session Equipment Utilized During Treatment: Gait belt Activity Tolerance: Patient tolerated treatment well Patient left: in bed;with call bell/phone within reach;with family/visitor present;with nursing/sitter in room Nurse Communication: Mobility status         Time: 8250-0370 PT Time Calculation (min) (ACUTE ONLY): 40 min   Charges:   PT Evaluation $Initial PT Evaluation Tier I: 1 Procedure PT Treatments $Gait Training: 8-22 mins $Therapeutic Activity: 8-22 mins   PT G CodesKingsley Callander 02/28/2015, 10:11 AM   Kittie Plater, PT, DPT Pager #: [phone] Office #: [phone]

## 2015-02-28 NOTE — Consult Note (Signed)
Physical Medicine and Rehabilitation Consult Reason for Consult: Right paramedian pontine infarct Referring Physician: [facility]   HPI: Stanley Robles is a 81 y.o. right handed male history of hypertension, coronary artery disease with MI 3, PAF with chronic Coumadin. Patient lives alone independently prior to admission and active. Presented 02/27/2015 with  left-sided weakness and slurred speech that progressed over a three-day period. He initially went to see his PCP Dr Woody Seller with MRI completed showed a right pontine infarct. Decision was made to do workup as outpatient however his left-sided weakness increased and he presented to the ED at [facility]. Follow-up MRI showed stable size distribution of subacute right paramedian pontine infarct. INR time of admission 1.44. Patient did not receive TPA. Neurology consulted with workup ongoing carotid Dopplers and echocardiogram pending. Patient has been transitioned to Eliquis. Currently on mechanical soft nectar thick liquid diet. Physical therapy evaluation completed 02/28/2015 with recommendations of physical medicine rehabilitation consult   Review of Systems  HENT: Positive for hearing loss.   Cardiovascular: Positive for palpitations.  Gastrointestinal: Positive for constipation.       GERD  Genitourinary: Positive for urgency.  All other systems reviewed and are negative.  Past Medical History  Diagnosis Date  . Hyperlipidemia   . Peripheral vascular disease   . Coronary artery disease     DR. TILLEY IS PT'S CARDIOLGIST  . Myocardial infarction 1994  . Gout     USUALLY IN THE FEET  . BPH (benign prostatic hypertrophy)   . GERD (gastroesophageal reflux disease)     ROLAIDS IF NEEDED  . 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 Tooth disease     PT STATES HE IS SCHEDULED TO HAVE BAD TOOTH PULLED 03/24/13 - WILL CHECK WITH DR. Wynelle Link TO MAKE SURE THIS IS OK TO DO BEFORE HIS PLANNED KNEE REPLACEMENT ON  5/5.  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 Chronic kidney disease stage 3   . Hypertensive heart disease   . Paroxysmal atrial fibrillation 04/01/2013  . Osteoarthritis    Past Surgical History  Procedure Laterality Date  . Mastoid surgery x 5    . Prostate surgery    . Hernia repair  12/26/2002    BILATERAL INGUINAL HERNIA REPAIR  . Cardiac catheterization  07/21/2005  . Total knee arthroplasty Left 03/28/2013    Procedure: LEFT TOTAL KNEE ARTHROPLASTY;  Surgeon: Gearlean Alf, MD;  Location: [facility];  Service: Orthopedics;  Laterality: Left;   Family History  Problem Relation Age of Onset  . Hypertension Mother   . Hyperlipidemia Mother   . Hypertension Father    Social History:  reports that he has quit smoking. He has never used smokeless tobacco. He reports that he does not drink alcohol or use illicit drugs. Allergies: No Known Allergies Medications Prior to Admission  Medication Sig Dispense Refill  . acetaminophen (TYLENOL) 500 MG tablet Take 1,000 mg by mouth every 6 (six) hours as needed for pain.    Marland Kitchen allopurinol (ZYLOPRIM) 300 MG tablet Take 300 mg by mouth daily.    Marland Kitchen atenolol (TENORMIN) 25 MG tablet Take 1 tablet (25 mg total) by mouth 2 (two) times daily. (Patient taking differently: Take 25 mg by mouth daily. )    . colchicine 0.6 MG tablet Take 0.6 mg by mouth daily.    Marland Kitchen docusate sodium (COLACE) 100 MG  capsule Take 100 mg by mouth daily.    Marland Kitchen docusate sodium 100 MG CAPS Take 100 mg by mouth 2 (two) times daily. 60 capsule 0  . DUREZOL 0.05 % EMUL     . finasteride (PROSCAR) 5 MG tablet Take 5 mg by mouth daily.    Marland Kitchen lisinopril (PRINIVIL,ZESTRIL) 10 MG tablet Take 10 mg by mouth daily.    Marland Kitchen lisinopril (PRINIVIL,ZESTRIL) 20 MG tablet Take 20 mg by mouth daily.    Marland Kitchen omeprazole (PRILOSEC) 20 MG capsule Take 20 mg by mouth daily.    . polyvinyl alcohol (LIQUIFILM TEARS) 1.4 % ophthalmic solution Place 2  drops into both eyes as needed. 15 mL   . rosuvastatin (CRESTOR) 20 MG tablet Take 20 mg by mouth every other day.    . warfarin (COUMADIN) 4 MG tablet Take 4 mg by mouth daily.    Marland Kitchen warfarin (COUMADIN) 4 MG tablet Take 4 mg by mouth daily. 4 mg on Monday and all other days is 2 mg    . CRESTOR 40 MG tablet Take 40 mg by mouth daily.    Marland Kitchen gentamicin (GARAMYCIN) 0.3 % ophthalmic solution     . ILEVRO 0.3 % ophthalmic suspension     . nitroGLYCERIN (NITROSTAT) 0.4 MG SL tablet Place 0.4 mg under the tongue every 5 (five) minutes as needed for chest pain.      Home: Home Living Family/patient expects to be discharged to:: Inpatient rehab Living Arrangements: Alone Additional Comments: daughter Shauna Hugh present and reports her and her sisters can figure out how to provide 24/7 assist for patient  Lives With: Alone  Functional History: Prior Function Level of Independence: Independent Comments: worked on cars in his shop, did grocery shopping Functional Status:  Mobility: Bed Mobility Overal bed mobility: Needs Assistance Bed Mobility: Rolling, Sidelying to Sit Rolling: Supervision (use of bed rail) Sidelying to sit: Hayden bed mobility comments: used bed rail HOB elevated, max directional cues, assist for balance and trunk elevation Transfers Overall transfer level: Needs assistance Equipment used: 1 person hand held assist Transfers: Sit to/from Stand Sit to Stand: Mod assist General transfer comment: pt slightly impulsive requiring max directional v/c's and tactile cues for safe management of L LE and stabilty once in standing due to L lateral lean and inability to maintain midline posture Ambulation/Gait Ambulation/Gait assistance: Max assist, +2 physical assistance, +2 safety/equipment Ambulation Distance (Feet): 100 Feet Assistive device: 1 person hand held assist (proximal assist at pelvis) Gait Pattern/deviations: Decreased step length - left, Decreased stride  length, Decreased stance time - left, Staggering left, Antalgic, Step-through pattern Gait velocity: decreased General Gait Details: pt with impaired co-ordination/sequencing of steps. pt with improved stepping pattern once max stabilization provided at hips to provide stability to allow pt to focus on L LE kinematics. pt with strong lean L.    ADL:    Cognition: Cognition Overall Cognitive Status: Within Functional Limits for tasks assessed Arousal/Alertness: Awake/alert Orientation Level: Oriented X4 Attention: Sustained (selective difficult to test due to pt's hearing loss) Sustained Attention: Appears intact Memory: Appears intact (pt recalled prior medical events) Awareness: Appears intact Safety/Judgment: Appears intact (exhibits good judgement re: need for MBS due to asp risk) Comments: pt stopped driving approx one week ago after his CVA Cognition Arousal/Alertness: Awake/alert Behavior During Therapy: WFL for tasks assessed/performed Overall Cognitive Status: Within Functional Limits for tasks assessed  Blood pressure 178/54, pulse 50, temperature 97.5 F (36.4 C), temperature source Oral, resp. rate 20, height  5\' 7"  (1.702 m), weight 81.647 kg (180 lb), SpO2 95 %. Physical Exam  HENT:  Left facial droop  Eyes: EOM are normal.  Neck: Normal range of motion. Neck supple. No thyromegaly present.  Cardiovascular:  Cardiac rate control  Respiratory: Effort normal and breath sounds normal. No respiratory distress.  GI: Soft. Bowel sounds are normal. He exhibits no distension.  Neurological: He is alert.  Patient is very hard of hearing.Makes good eye contact with examiner.Follows commands.Provides name age and DOB.Limited awareness of deficits  Skin: Skin is warm and dry.  3- Left delt, bi, tri, grip, HF,  4/5 L KE,3 ADF 5/5 on Right side Moderate dysmetria Left finger nose finger  Results for orders placed or performed during the hospital encounter of 02/27/15 (from the  past 24 hour(s))  CBG monitoring, ED     Status: None   Collection Time: 02/27/15 12:06 PM  Result Value Ref Range   Glucose-Capillary 75 70 - 99 mg/dL   Comment 1 Notify RN    Comment 2 Document in Chart   Protime-INR     Status: Abnormal   Collection Time: 02/27/15 12:10 PM  Result Value Ref Range   Prothrombin Time 17.6 (H) 11.6 - 15.2 seconds   INR 1.44 0.00 - 1.49  APTT     Status: None   Collection Time: 02/27/15 12:10 PM  Result Value Ref Range   aPTT 34 24 - 37 seconds  CBC     Status: Abnormal   Collection Time: 02/27/15 12:10 PM  Result Value Ref Range   WBC 10.4 4.0 - 10.5 K/uL   RBC 5.05 4.22 - 5.81 MIL/uL   Hemoglobin 17.3 (H) 13.0 - 17.0 g/dL   HCT 49.0 39.0 - 52.0 %   MCV 97.0 78.0 - 100.0 fL   MCH 34.3 (H) 26.0 - 34.0 pg   MCHC 35.3 30.0 - 36.0 g/dL   RDW 14.3 11.5 - 15.5 %   Platelets 100 (L) 150 - 400 K/uL  Differential     Status: Abnormal   Collection Time: 02/27/15 12:10 PM  Result Value Ref Range   Neutrophils Relative % 79 (H) 43 - 77 %   Neutro Abs 8.2 (H) 1.7 - 7.7 K/uL   Lymphocytes Relative 14 12 - 46 %   Lymphs Abs 1.5 0.7 - 4.0 K/uL   Monocytes Relative 6 3 - 12 %   Monocytes Absolute 0.7 0.1 - 1.0 K/uL   Eosinophils Relative 1 0 - 5 %   Eosinophils Absolute 0.1 0.0 - 0.7 K/uL   Basophils Relative 0 0 - 1 %   Basophils Absolute 0.0 0.0 - 0.1 K/uL  Comprehensive metabolic panel     Status: Abnormal   Collection Time: 02/27/15 12:10 PM  Result Value Ref Range   Sodium 138 135 - 145 mmol/L   Potassium 4.0 3.5 - 5.1 mmol/L   Chloride 108 96 - 112 mmol/L   CO2 24 19 - 32 mmol/L   Glucose, Bld 88 70 - 99 mg/dL   BUN 24 (H) 6 - 23 mg/dL   Creatinine, Ser 1.59 (H) 0.50 - 1.35 mg/dL   Calcium 8.5 8.4 - 10.5 mg/dL   Total Protein 5.8 (L) 6.0 - 8.3 g/dL   Albumin 3.0 (L) 3.5 - 5.2 g/dL   AST 15 0 - 37 U/L   ALT 16 0 - 53 U/L   Alkaline Phosphatase 75 39 - 117 U/L   Total Bilirubin 1.1 0.3 - 1.2 mg/dL  GFR calc non Af Amer 39 (L) >90 mL/min     GFR calc Af Amer 46 (L) >90 mL/min   Anion gap 6 5 - 15  I-stat troponin, ED (not at [facility])     Status: None   Collection Time: 02/27/15 12:17 PM  Result Value Ref Range   Troponin i, poc 0.00 0.00 - 0.08 ng/mL   Comment 3          I-Stat Chem 8, ED     Status: Abnormal   Collection Time: 02/27/15 12:19 PM  Result Value Ref Range   Sodium 139 135 - 145 mmol/L   Potassium 4.1 3.5 - 5.1 mmol/L   Chloride 105 96 - 112 mmol/L   BUN 27 (H) 6 - 23 mg/dL   Creatinine, Ser 1.50 (H) 0.50 - 1.35 mg/dL   Glucose, Bld 87 70 - 99 mg/dL   Calcium, Ion 1.11 (L) 1.13 - 1.30 mmol/L   TCO2 20 0 - 100 mmol/L   Hemoglobin 16.7 13.0 - 17.0 g/dL   HCT 49.0 39.0 - 52.0 %  Lipid panel     Status: Abnormal   Collection Time: 02/28/15  4:27 AM  Result Value Ref Range   Cholesterol 162 0 - 200 mg/dL   Triglycerides 145 <150 mg/dL   HDL 31 (L) >39 mg/dL   Total CHOL/HDL Ratio 5.2 RATIO   VLDL 29 0 - 40 mg/dL   LDL Cholesterol 102 (H) 0 - 99 mg/dL  Basic metabolic panel     Status: Abnormal   Collection Time: 02/28/15  4:27 AM  Result Value Ref Range   Sodium 139 135 - 145 mmol/L   Potassium 4.2 3.5 - 5.1 mmol/L   Chloride 108 96 - 112 mmol/L   CO2 20 19 - 32 mmol/L   Glucose, Bld 74 70 - 99 mg/dL   BUN 23 6 - 23 mg/dL   Creatinine, Ser 1.51 (H) 0.50 - 1.35 mg/dL   Calcium 8.6 8.4 - 10.5 mg/dL   GFR calc non Af Amer 42 (L) >90 mL/min   GFR calc Af Amer 49 (L) >90 mL/min   Anion gap 11 5 - 15  CBC     Status: Abnormal   Collection Time: 02/28/15  4:27 AM  Result Value Ref Range   WBC 7.4 4.0 - 10.5 K/uL   RBC 4.79 4.22 - 5.81 MIL/uL   Hemoglobin 16.1 13.0 - 17.0 g/dL   HCT 47.2 39.0 - 52.0 %   MCV 98.5 78.0 - 100.0 fL   MCH 33.6 26.0 - 34.0 pg   MCHC 34.1 30.0 - 36.0 g/dL   RDW 14.1 11.5 - 15.5 %   Platelets 79 (L) 150 - 400 K/uL   Dg Chest 2 View  02/27/2015   CLINICAL DATA:  Cough and left-sided weakness.  EXAM: CHEST  2 VIEW  COMPARISON:  08/29/2014  FINDINGS: Shallow inspiration. Heart  size and pulmonary vascularity are borderline but likely normal for technique. No focal airspace disease or consolidation in the lungs. No blunting of costophrenic angles. No pneumothorax. Calcification of the aorta. Degenerative changes in the spine.  IMPRESSION: No active cardiopulmonary disease.   Electronically Signed   By: Lucienne Capers M.D.   On: 02/27/2015 23:34   Ct Head (brain) Wo Contrast  02/27/2015   CLINICAL DATA:  Increase left-sided weakness and facial droop. Patient with documented pontine infarct on the right on recent MR.  EXAM: CT HEAD WITHOUT CONTRAST  TECHNIQUE: Contiguous axial images were  obtained from the base of the skull through the vertex without intravenous contrast.  COMPARISON:  Brain MRI February 22, 2015  FINDINGS: Mild diffuse atrophy is stable. There is no intracranial mass, hemorrhage, extra-axial fluid collection, or midline shift. The recent infarct in the medial right pons is seen on CT than does not appear significantly changed compared to recent MR study. There is mild small vessel disease in the centra semiovale bilaterally. No new gray-white compartment lesion is identified. No acute infarct in addition to the acute infarct in the right pons noted on recent MR is seen. The bony calvarium appears intact. The patient has had mastoidectomies bilaterally.  IMPRESSION: Stable recent infarct medial right pons. Stable atrophy with mild periventricular small vessel disease. No new infarct identified compared to recent MR study. No hemorrhage, mass effect, or extra-axial fluid collection. Status post bilateral mastoidectomies.   Electronically Signed   By: Lowella Grip III M.D.   On: 02/27/2015 14:24   Mr Angiogram Neck W Wo Contrast  02/28/2015   EXAM: MRI HEAD WITHOUT AND WITH CONTRAST AND MRA HEAD WITHOUT CONTRAST AND MRI NECK WITHOUT AND WITH CONTRAST  TECHNIQUE: Multiplanar, multiecho pulse sequences of the brain and surrounding structures were obtained without and with  intravenous contrast. Angiographic images of the head were obtained using MRA technique without and with contrast. Multiplanar, multiecho pulse sequences of the neck and surrounding structures were obtained without and with intravenous contrast.  CONTRAST:  27mL MULTIHANCE GADOBENATE DIMEGLUMINE 529 MG/ML IV SOLN  COMPARISON:  None.  FINDINGS: MRI HEAD FINDINGS  There is persistent abnormal restricted diffusion in region of recently identified right pontine infarct. Overall, the size and distribution of this infarct is not significantly changed relative to prior study. The area of infarction measures approximately 12 x 14 x 10 mm. Associated ADC signal abnormality has nearly normalized. Additionally, there is patchy post-contrast enhancement within this region (Series 17, image 19). Constellation of findings most compatible with a subacute infarct.  No other abnormal foci of restricted diffusion to suggest acute intracranial infarct identified. Gray-white matter differentiation otherwise maintained. Probable tiny remote lacunar infarct present within the right cerebellar hemisphere. Tiny remote lacunar infarcts also present within the thalami bilaterally. Tiny remote left pontine infarct also noted.  No mass lesion or midline shift. Very mild chronic small vessel ischemic changes noted. No acute or chronic intracranial hemorrhage. No hydrocephalus. No extra-axial fluid collection.  Craniocervical junction within normal limits. Pituitary gland unremarkable. No acute abnormality about the orbits. Sequelae of prior cataract extraction on the right. Very mild mucoperiosteal thickening present within the left maxillary sinus. 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 No mastoid effusion. Inner ear structures normal.  Degenerative changes noted within the upper cervical spine. Bone marrow signal intensity normal. Scalp soft tissues unremarkable.  No other abnormal enhancement within the brain.  MRA HEAD  FINDINGS  ANTERIOR CIRCULATION:  Visualized distal portions of the cervical segments of the internal carotid arteries are widely patent bilaterally with antegrade flow. The petrous, cavernous, and supra clinoid segment of the left ICA are widely patent, although there is minimal atheromatous irregularity within the proximal cavernous segment. There is diffuse narrowing of the distal petrous, cavernous, and supra clinoid right ICA. This is likely related to a hypoplastic right A1 segment. The anterior cerebral arteries are supplied primarily via a widely patent dominant left A1 segment. The anterior cerebral arteries themselves are widely patent and well opacified.  M1 segments patent bilaterally without focal stenosis or occlusion.  MCA bifurcations within normal limits. There is distal branch atheromatous irregularity within the MCA branches bilaterally, right slightly worse than left.  POSTERIOR CIRCULATION:  Vertebral arteries are patent bilaterally to the level of the vertebrobasilar junction. There is atheromatous irregularity within the somewhat diminutive right vertebral artery with superimposed short segment severe stenosis. This is better seen on concomitant MRA of the neck (series 11607, image 5). The left vertebral artery is slightly dominant. Posterior inferior cerebellar arteries are patent proximally. There is mild multi focal atheromatous irregularity within the basilar artery without focal high-grade stenosis or occlusion. Superior cerebellar arteries patent proximally. P1 segments patent bilaterally. The left P1 segment is slightly hypoplastic with a prominent left posterior communicating artery. Distal branch atheromatous irregularity present within the PCA branches bilaterally. The distal left P2 segment is not well visualized, and may be occluded.  No aneurysm or vascular malformation.  MRI NECK FINDINGS  Visualized portions of the aortic arch are within normal limits with normal 3 vessel  morphology. No high-grade stenosis seen at the origin of the great vessels. Visualized subclavian arteries are patent bilaterally.  The right common carotid artery is well opacified to the carotid bifurcation. There is focal atheromatous plaque within the proximal right ICA with associated short segment stenosis of approximately 30%. Distally, the right ICA is well opacified.  The left common carotid artery is widely patent to the carotid bifurcation, although there is multi focal irregular plaque within the distal left common carotid artery without focal stenosis. Atheromatous irregularity and plaque present about the left carotid bifurcation, extending into the proximal left ICA. There is associated multi focal atheromatous irregularity within the proximal left ICA with superimposed stenoses of up to approximately 50%. The distal left ICA is opacified to the skullbase.  Both vertebral arteries arise from the subclavian arteries. Vertebral arteries are fairly well opacified along their entire course to the level of the dura without focal stenosis, dissection, or occlusion. There is a superimposed high-grade stenosis within the distal right V4 segment just prior to the vertebrobasilar junction.  IMPRESSION: MRI HEAD IMPRESSION:  1. Stable size and distribution of subacute right paramedian pontine infarct as detailed above. No hemorrhagic transformation. 2. No new intracranial infarct or other abnormality identified. 3. Chronic small vessel ischemic disease with multiple remote lacunar infarcts as above.  MRA HEAD IMPRESSION:  1. No proximal branch arterial occlusion identified within the intracranial circulation. 2. Focal high-grade stenosis within the distal right V4 segment just prior to the vertebrobasilar junction. This is best seen on concomitant neck MRA portion of this exam. 3. Diffuse irregular narrowing of the petrous, cavernous, and supra clinoid right ICA. This is likely related to the hypoplastic right  A1 segment, with the anterior cerebral arteries supplied mainly via the left internal carotid artery system. 4. Multi focal atheromatous irregularity within the distal MCA and PCA branches.  MRA NECK IMPRESSION:  1. Multi focal atheromatous irregularity within the proximal left ICA with associated multi focal stenoses of up to approximately 50%. 2. More mild atheromatous disease within the proximal right ICA with associated short segment stenosis of approximately 30%. 3. Short segment high-grade stenosis within the right V4 segment as above. Vertebral arteries otherwise widely patent.   Electronically Signed   By: Jeannine Boga M.D.   On: 02/28/2015 01:49   Mr Jeri Cos BW Contrast  02/28/2015   EXAM: MRI HEAD WITHOUT AND WITH CONTRAST AND MRA HEAD WITHOUT CONTRAST AND MRI NECK WITHOUT AND WITH CONTRAST  TECHNIQUE: Multiplanar, multiecho  pulse sequences of the brain and surrounding structures were obtained without and with intravenous contrast. Angiographic images of the head were obtained using MRA technique without and with contrast. Multiplanar, multiecho pulse sequences of the neck and surrounding structures were obtained without and with intravenous contrast.  CONTRAST:  54mL MULTIHANCE GADOBENATE DIMEGLUMINE 529 MG/ML IV SOLN  COMPARISON:  None.  FINDINGS: MRI HEAD FINDINGS  There is persistent abnormal restricted diffusion in region of recently identified right pontine infarct. Overall, the size and distribution of this infarct is not significantly changed relative to prior study. The area of infarction measures approximately 12 x 14 x 10 mm. Associated ADC signal abnormality has nearly normalized. Additionally, there is patchy post-contrast enhancement within this region (Series 17, image 19). Constellation of findings most compatible with a subacute infarct.  No other abnormal foci of restricted diffusion to suggest acute intracranial infarct identified. Gray-white matter differentiation otherwise  maintained. Probable tiny remote lacunar infarct present within the right cerebellar hemisphere. Tiny remote lacunar infarcts also present within the thalami bilaterally. Tiny remote left pontine infarct also noted.  No mass lesion or midline shift. Very mild chronic small vessel ischemic changes noted. No acute or chronic intracranial hemorrhage. No hydrocephalus. No extra-axial fluid collection.  Craniocervical junction within normal limits. Pituitary gland unremarkable. No acute abnormality about the orbits. Sequelae of prior cataract extraction on the right. Very mild mucoperiosteal thickening present within the left maxillary sinus. 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 No mastoid effusion. Inner ear structures normal.  Degenerative changes noted within the upper cervical spine. Bone marrow signal intensity normal. Scalp soft tissues unremarkable.  No other abnormal enhancement within the brain.  MRA HEAD FINDINGS  ANTERIOR CIRCULATION:  Visualized distal portions of the cervical segments of the internal carotid arteries are widely patent bilaterally with antegrade flow. The petrous, cavernous, and supra clinoid segment of the left ICA are widely patent, although there is minimal atheromatous irregularity within the proximal cavernous segment. There is diffuse narrowing of the distal petrous, cavernous, and supra clinoid right ICA. This is likely related to a hypoplastic right A1 segment. The anterior cerebral arteries are supplied primarily via a widely patent dominant left A1 segment. The anterior cerebral arteries themselves are widely patent and well opacified.  M1 segments patent bilaterally without focal stenosis or occlusion. MCA bifurcations within normal limits. There is distal branch atheromatous irregularity within the MCA branches bilaterally, right slightly worse than left.  POSTERIOR CIRCULATION:  Vertebral arteries are patent bilaterally to the level of the vertebrobasilar  junction. There is atheromatous irregularity within the somewhat diminutive right vertebral artery with superimposed short segment severe stenosis. This is better seen on concomitant MRA of the neck (series 11607, image 5). The left vertebral artery is slightly dominant. Posterior inferior cerebellar arteries are patent proximally. There is mild multi focal atheromatous irregularity within the basilar artery without focal high-grade stenosis or occlusion. Superior cerebellar arteries patent proximally. P1 segments patent bilaterally. The left P1 segment is slightly hypoplastic with a prominent left posterior communicating artery. Distal branch atheromatous irregularity present within the PCA branches bilaterally. The distal left P2 segment is not well visualized, and may be occluded.  No aneurysm or vascular malformation.  MRI NECK FINDINGS  Visualized portions of the aortic arch are within normal limits with normal 3 vessel morphology. No high-grade stenosis seen at the origin of the great vessels. Visualized subclavian arteries are patent bilaterally.  The right common carotid artery is well opacified to the carotid  bifurcation. There is focal atheromatous plaque within the proximal right ICA with associated short segment stenosis of approximately 30%. Distally, the right ICA is well opacified.  The left common carotid artery is widely patent to the carotid bifurcation, although there is multi focal irregular plaque within the distal left common carotid artery without focal stenosis. Atheromatous irregularity and plaque present about the left carotid bifurcation, extending into the proximal left ICA. There is associated multi focal atheromatous irregularity within the proximal left ICA with superimposed stenoses of up to approximately 50%. The distal left ICA is opacified to the skullbase.  Both vertebral arteries arise from the subclavian arteries. Vertebral arteries are fairly well opacified along their entire  course to the level of the dura without focal stenosis, dissection, or occlusion. There is a superimposed high-grade stenosis within the distal right V4 segment just prior to the vertebrobasilar junction.  IMPRESSION: MRI HEAD IMPRESSION:  1. Stable size and distribution of subacute right paramedian pontine infarct as detailed above. No hemorrhagic transformation. 2. No new intracranial infarct or other abnormality identified. 3. Chronic small vessel ischemic disease with multiple remote lacunar infarcts as above.  MRA HEAD IMPRESSION:  1. No proximal branch arterial occlusion identified within the intracranial circulation. 2. Focal high-grade stenosis within the distal right V4 segment just prior to the vertebrobasilar junction. This is best seen on concomitant neck MRA portion of this exam. 3. Diffuse irregular narrowing of the petrous, cavernous, and supra clinoid right ICA. This is likely related to the hypoplastic right A1 segment, with the anterior cerebral arteries supplied mainly via the left internal carotid artery system. 4. Multi focal atheromatous irregularity within the distal MCA and PCA branches.  MRA NECK IMPRESSION:  1. Multi focal atheromatous irregularity within the proximal left ICA with associated multi focal stenoses of up to approximately 50%. 2. More mild atheromatous disease within the proximal right ICA with associated short segment stenosis of approximately 30%. 3. Short segment high-grade stenosis within the right V4 segment as above. Vertebral arteries otherwise widely patent.   Electronically Signed   By: Jeannine Boga M.D.   On: 02/28/2015 01:49   Dg Swallowing Func-speech Pathology  02/28/2015    Objective Swallowing Evaluation:    Patient Details  Name: Euell Robles MRN: [id number] Date of Birth: 1932-05-01  Today's Date: 02/28/2015 Time: SLP Start Time (ACUTE ONLY): 0955-SLP Stop Time (ACUTE ONLY): 1036 SLP Time Calculation (min) (ACUTE ONLY): 41 min  Past Medical History:   Past Medical History  Diagnosis Date  . Hyperlipidemia   . Peripheral vascular disease   . Coronary artery disease     DR. TILLEY IS PT'S CARDIOLGIST  . Myocardial infarction 1994  . Gout     USUALLY IN THE FEET  . BPH (benign prostatic hypertrophy)   . GERD (gastroesophageal reflux disease)     ROLAIDS IF NEEDED  . 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 Tooth disease     PT STATES HE IS SCHEDULED TO HAVE BAD TOOTH PULLED 03/24/13 - WILL CHECK  WITH DR. Wynelle Link TO MAKE SURE THIS IS OK TO DO BEFORE HIS PLANNED KNEE  REPLACEMENT ON 5/5.  Marland Kitchen Personal history of transient ischemic attack (TIA) and cerebral  infarction without residual deficit   . Chronic kidney disease stage 3   . Hypertensive heart disease   . Paroxysmal atrial fibrillation 04/01/2013  . Osteoarthritis    Past Surgical History:  Past Surgical History  Procedure  Laterality Date  . Mastoid surgery x 5    . Prostate surgery    . Hernia repair  12/26/2002    BILATERAL INGUINAL HERNIA REPAIR  . Cardiac catheterization  07/21/2005  . Total knee arthroplasty Left 03/28/2013    Procedure: LEFT TOTAL KNEE ARTHROPLASTY;  Surgeon: Gearlean Alf, MD;   Location: [facility];  Service: Orthopedics;  Laterality: Left;   HPI:  HPI: 81 yo male adm with left sided weakness, worsening coughing with  po/dysphagia recently diagnosed with right pontine CVA.  PMH + for MI,  knee replacement.  Pt on blood thinner.  Swallow evaluation and speech  evaluation ordered.    No Data Recorded  Assessment / Plan / Recommendation [facility]L IP CLINICAL IMPRESSIONS 02/28/2015  Dysphagia Diagnosis Moderate oral phase dysphagia;Moderate pharyngeal  phase dysphagia  Clinical impression Moderate oropharyngeal dysphagia with sensorimotor  deficits resulting in delayed oral transiting, delayed pharyngeal swallow  response.  Gross aspiration of nectar liquid via cup noted due to delayed  swallow response - barium fills pyriform sinus and spilled into open  airway prior to swallow.  Trace  aspiration of thin via straw with chin  tuck noted.  Pt did not clear aspirates even with strong reflexive  coughing.  Chin tuck posture effective to prevent aspiration with all  consistencies except straw boluses of thin.  Cued dry swallows helpful to  decreased oropharyngeal resiudals.   Recommend to initiate a dys3/nectar diet, allowing tsps of thin water  between meals after oral care with very strict aspiration precautions.        CHL IP TREATMENT RECOMMENDATION 02/28/2015  Treatment Plan Recommendations Therapy as outlined in treatment plan below      [facility] IP DIET RECOMMENDATION 02/28/2015  Diet Recommendations Dysphagia 3 (Mechanical Soft);Nectar-thick liquid  Liquid Administration via Cup;Straw  Medication Administration Crushed with puree  Compensations Multiple dry swallows after each bite/sip;Slow rate;Small  sips/bites;Check for pocketing  Postural Changes and/or Swallow Maneuvers Chin tuck     CHL IP OTHER RECOMMENDATIONS 02/28/2015  Recommended Consults MBS  Oral Care Recommendations Oral care Q4 per protocol  Other Recommendations Order thickener from pharmacy;Have oral suction  available     [facility] IP FOLLOW UP RECOMMENDATIONS 02/28/2015  Follow up Recommendations Inpatient Rehab     CHL IP FREQUENCY AND DURATION 02/28/2015  Speech Therapy Frequency (ACUTE ONLY) min 2x/week  Treatment Duration 2 weeks         [facility] IP REASON FOR REFERRAL 02/28/2015  Reason for Referral Objectively evaluate swallowing function     CHL IP ORAL PHASE 02/28/2015  Oral Phase Impaired  Oral - Honey Teaspoon Reduced posterior propulsion  Oral - Nectar Teaspoon Reduced posterior propulsion;Delayed oral  transit;Weak lingual manipulation  Oral - Nectar Cup Reduced posterior propulsion;Delayed oral transit;Weak  lingual manipulation  Oral - Nectar Straw Reduced posterior propulsion;Delayed oral transit;Weak  lingual manipulation  Oral - Thin Cup Reduced posterior propulsion;Delayed oral transit;Weak  lingual manipulation  Oral - Thin Straw  Reduced posterior propulsion;Delayed oral transit;Weak  lingual manipulation  Oral - Thin Syringe (None)  Oral - Puree Reduced posterior propulsion;Delayed oral transit;Weak  lingual manipulation  Oral - Mechanical Soft (None)  Oral - Regular Reduced posterior propulsion;Delayed oral transit;Weak  lingual manipulation      CHL IP PHARYNGEAL PHASE 02/28/2015  Pharyngeal Phase Impaired  Pharyngeal - Honey Teaspoon Delayed swallow initiation  Pharyngeal - Nectar Teaspoon Delayed swallow initiation  Penetration/Aspiration details (nectar teaspoon) (None)  Pharyngeal - Nectar Cup Delayed swallow initiation;Premature spillage  to  pyriform sinuses;Penetration/Aspiration before swallow;Significant  aspiration (Amount)  Penetration/Aspiration details (nectar cup) Material enters airway, passes  BELOW cords and not ejected out despite cough attempt by patient  Pharyngeal - Nectar Straw Delayed swallow initiation;Premature spillage to  valleculae;Premature spillage to pyriform sinuses  Pharyngeal - Thin Teaspoon Delayed swallow initiation;Premature spillage  to valleculae  Penetration/Aspiration details (thin teaspoon) (None)  Pharyngeal - Thin Cup Delayed swallow initiation;Premature spillage to  valleculae  Penetration/Aspiration details (thin cup) (None)  Pharyngeal - Thin Straw Delayed swallow initiation;Premature spillage to  valleculae;Premature spillage to pyriform sinuses  Penetration/Aspiration details (thin straw) Trace aspiration  Pharyngeal - Puree Delayed swallow initiation;Premature spillage to  valleculae  Pharyngeal - Regular Delayed swallow initiation;Premature spillage to  valleculae  Pharyngeal Comment mild amount of oral residuals prematurely spill into  pharynx without sensation and pt requiring verbal cues to swallow     [facility] IP CERVICAL ESOPHAGEAL PHASE 02/28/2015  Cervical Esophageal Phase [facility]    No flowsheet data found.         Luanna Salk, [facility] SLP [phone]    Mr Stanley Robles Head/brain Wo Cm  02/28/2015    EXAM: MRI HEAD WITHOUT AND WITH CONTRAST AND MRA HEAD WITHOUT CONTRAST AND MRI NECK WITHOUT AND WITH CONTRAST  TECHNIQUE: Multiplanar, multiecho pulse sequences of the brain and surrounding structures were obtained without and with intravenous contrast. Angiographic images of the head were obtained using MRA technique without and with contrast. Multiplanar, multiecho pulse sequences of the neck and surrounding structures were obtained without and with intravenous contrast.  CONTRAST:  31mL MULTIHANCE GADOBENATE DIMEGLUMINE 529 MG/ML IV SOLN  COMPARISON:  None.  FINDINGS: MRI HEAD FINDINGS  There is persistent abnormal restricted diffusion in region of recently identified right pontine infarct. Overall, the size and distribution of this infarct is not significantly changed relative to prior study. The area of infarction measures approximately 12 x 14 x 10 mm. Associated ADC signal abnormality has nearly normalized. Additionally, there is patchy post-contrast enhancement within this region (Series 17, image 19). Constellation of findings most compatible with a subacute infarct.  No other abnormal foci of restricted diffusion to suggest acute intracranial infarct identified. Gray-white matter differentiation otherwise maintained. Probable tiny remote lacunar infarct present within the right cerebellar hemisphere. Tiny remote lacunar infarcts also present within the thalami bilaterally. Tiny remote left pontine infarct also noted.  No mass lesion or midline shift. Very mild chronic small vessel ischemic changes noted. No acute or chronic intracranial hemorrhage. No hydrocephalus. No extra-axial fluid collection.  Craniocervical junction within normal limits. Pituitary gland unremarkable. No acute abnormality about the orbits. Sequelae of prior cataract extraction on the right. Very mild mucoperiosteal thickening present within the left maxillary sinus. 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 No mastoid  effusion. Inner ear structures normal.  Degenerative changes noted within the upper cervical spine. Bone marrow signal intensity normal. Scalp soft tissues unremarkable.  No other abnormal enhancement within the brain.  MRA HEAD FINDINGS  ANTERIOR CIRCULATION:  Visualized distal portions of the cervical segments of the internal carotid arteries are widely patent bilaterally with antegrade flow. The petrous, cavernous, and supra clinoid segment of the left ICA are widely patent, although there is minimal atheromatous irregularity within the proximal cavernous segment. There is diffuse narrowing of the distal petrous, cavernous, and supra clinoid right ICA. This is likely related to a hypoplastic right A1 segment. The anterior cerebral arteries are supplied primarily via a widely patent dominant left A1 segment. The  anterior cerebral arteries themselves are widely patent and well opacified.  M1 segments patent bilaterally without focal stenosis or occlusion. MCA bifurcations within normal limits. There is distal branch atheromatous irregularity within the MCA branches bilaterally, right slightly worse than left.  POSTERIOR CIRCULATION:  Vertebral arteries are patent bilaterally to the level of the vertebrobasilar junction. There is atheromatous irregularity within the somewhat diminutive right vertebral artery with superimposed short segment severe stenosis. This is better seen on concomitant MRA of the neck (series 11607, image 5). The left vertebral artery is slightly dominant. Posterior inferior cerebellar arteries are patent proximally. There is mild multi focal atheromatous irregularity within the basilar artery without focal high-grade stenosis or occlusion. Superior cerebellar arteries patent proximally. P1 segments patent bilaterally. The left P1 segment is slightly hypoplastic with a prominent left posterior communicating artery. Distal branch atheromatous irregularity present within the PCA branches  bilaterally. The distal left P2 segment is not well visualized, and may be occluded.  No aneurysm or vascular malformation.  MRI NECK FINDINGS  Visualized portions of the aortic arch are within normal limits with normal 3 vessel morphology. No high-grade stenosis seen at the origin of the great vessels. Visualized subclavian arteries are patent bilaterally.  The right common carotid artery is well opacified to the carotid bifurcation. There is focal atheromatous plaque within the proximal right ICA with associated short segment stenosis of approximately 30%. Distally, the right ICA is well opacified.  The left common carotid artery is widely patent to the carotid bifurcation, although there is multi focal irregular plaque within the distal left common carotid artery without focal stenosis. Atheromatous irregularity and plaque present about the left carotid bifurcation, extending into the proximal left ICA. There is associated multi focal atheromatous irregularity within the proximal left ICA with superimposed stenoses of up to approximately 50%. The distal left ICA is opacified to the skullbase.  Both vertebral arteries arise from the subclavian arteries. Vertebral arteries are fairly well opacified along their entire course to the level of the dura without focal stenosis, dissection, or occlusion. There is a superimposed high-grade stenosis within the distal right V4 segment just prior to the vertebrobasilar junction.  IMPRESSION: MRI HEAD IMPRESSION:  1. Stable size and distribution of subacute right paramedian pontine infarct as detailed above. No hemorrhagic transformation. 2. No new intracranial infarct or other abnormality identified. 3. Chronic small vessel ischemic disease with multiple remote lacunar infarcts as above.  MRA HEAD IMPRESSION:  1. No proximal branch arterial occlusion identified within the intracranial circulation. 2. Focal high-grade stenosis within the distal right V4 segment just prior to  the vertebrobasilar junction. This is best seen on concomitant neck MRA portion of this exam. 3. Diffuse irregular narrowing of the petrous, cavernous, and supra clinoid right ICA. This is likely related to the hypoplastic right A1 segment, with the anterior cerebral arteries supplied mainly via the left internal carotid artery system. 4. Multi focal atheromatous irregularity within the distal MCA and PCA branches.  MRA NECK IMPRESSION:  1. Multi focal atheromatous irregularity within the proximal left ICA with associated multi focal stenoses of up to approximately 50%. 2. More mild atheromatous disease within the proximal right ICA with associated short segment stenosis of approximately 30%. 3. Short segment high-grade stenosis within the right V4 segment as above. Vertebral arteries otherwise widely patent.   Electronically Signed   By: Jeannine Boga M.D.   On: 02/28/2015 01:49    Assessment/Plan: Diagnosis: Left hemiparesis secondary to right pontine infarct 1. Does  the need for close, 24 hr/day medical supervision in concert with the patient's rehab needs make it unreasonable for this patient to be served in a less intensive setting? Yes 2. Co-Morbidities requiring supervision/potential complications: dysphagia, CKD 3, HTN, CAD, Afib RVR 3. Due to bladder management, bowel management, safety, skin/wound care, disease management, medication administration, pain management and patient education, does the patient require 24 hr/day rehab nursing? Yes 4. Does the patient require coordinated care of a physician, rehab nurse, PT (1-2 hrs/day, 5 days/week), OT (1-2 hrs/day, 5 days/week) and SLP (.5-1 hrs/day, 5 days/week) to address physical and functional deficits in the context of the above medical diagnosis(es)? Yes Addressing deficits in the following areas: balance, endurance, locomotion, strength, transferring, bowel/bladder control, bathing, dressing, feeding, grooming, toileting, cognition and  swallowing 5. Can the patient actively participate in an intensive therapy program of at least 3 hrs of therapy per day at least 5 days per week? Yes 6. The potential for patient to make measurable gains while on inpatient rehab is good 7. Anticipated functional outcomes upon discharge from inpatient rehab are supervision  with PT, supervision with OT, supervision with SLP. 8. Estimated rehab length of stay to reach the above functional goals is: 14-18days 9. Does the patient have adequate social supports and living environment to accommodate these discharge functional goals? Yes 10. Anticipated D/C setting: Home 11. Anticipated post D/C treatments: [facility] 12. Overall Rehab/Functional Prognosis: good  RECOMMENDATIONS: This patient's condition is appropriate for continued rehabilitative care in the following setting: CIR Patient has agreed to participate in recommended program. Yes Note that insurance prior authorization may be required for reimbursement for recommended care.  Comment: Needs to finish workup for CVA, in process of echo     02/28/2015

## 2015-02-28 NOTE — Progress Notes (Signed)
STROKE TEAM PROGRESS NOTE   HISTORY Stanley Robles is an 81 y.o. male who last Wed family noted his speech was dysarthric. They returned home and went to his PCP. The daughter at bedside was not at that visit but states his INR was supra-therapeutic and he was told to hold his Coumadin and restart on Sunday. The daughter states over the past 3 days she feels his left sided weakness has worsened. Patient himself does not feels anything has changed. MRI obtained in [place] shows a right pontine infarct which again is visualized on CT here in ED. He was last known well 02/21/2015, unable to determine time. Patient was not administered TPA secondary to previous stroke 4 days prior. Marland Kitchen He was admitted for further evaluation and treatment.   SUBJECTIVE (INTERVAL HISTORY) His daughter is at the bedside.  Overall he feels his condition is stable. Per daughter, she feels this occurred last 6. Went to the doctor on [place] ([place] who did an OP MRI. Continued to worsen from dysarthria to hemiparesis and ataxia.   OBJECTIVE Temp:  [97.5 F (36.4 C)-98.7 F (37.1 C)] 97.5 F (36.4 C) (04/06 1100) Pulse Rate:  [50-123] 50 (04/06 1100) Cardiac Rhythm:  [-] Sinus bradycardia (04/05 2100) Resp:  [14-21] 20 (04/06 1100) BP: (124-178)/(24-59) 178/54 mmHg (04/06 1100) SpO2:  [92 %-98 %] 95 % (04/06 1100) Weight:  [81.647 kg (180 lb)] 81.647 kg (180 lb) (04/05 1218)   Recent Labs Lab 02/27/15 1206  GLUCAP 75    Recent Labs Lab 02/27/15 1210 02/27/15 1219 02/28/15 0427  NA 138 139 139  K 4.0 4.1 4.2  CL 108 105 108  CO2 24  --  20  GLUCOSE 88 87 74  BUN 24* 27* 23  CREATININE 1.59* 1.50* 1.51*  CALCIUM 8.5  --  8.6    Recent Labs Lab 02/27/15 1210  AST 15  ALT 16  ALKPHOS 75  BILITOT 1.1  PROT 5.8*  ALBUMIN 3.0*    Recent Labs Lab 02/27/15 1210 02/27/15 1219 02/28/15 0427  WBC 10.4  --  7.4  NEUTROABS 8.2*  --   --   HGB 17.3* 16.7 16.1  HCT 49.0 49.0 47.2  MCV 97.0  --   98.5  PLT 100*  --  79*   No results for input(s): CKTOTAL, CKMB, CKMBINDEX, TROPONINI in the last 168 hours.  Recent Labs  02/27/15 1210  LABPROT 17.6*  INR 1.44   No results for input(s): COLORURINE, LABSPEC, PHURINE, GLUCOSEU, HGBUR, BILIRUBINUR, KETONESUR, PROTEINUR, UROBILINOGEN, NITRITE, LEUKOCYTESUR in the last 72 hours.  Invalid input(s): APPERANCEUR     Component Value Date/Time   CHOL 162 02/28/2015 0427   TRIG 145 02/28/2015 0427   HDL 31* 02/28/2015 0427   CHOLHDL 5.2 02/28/2015 0427   VLDL 29 02/28/2015 0427   LDLCALC 102* 02/28/2015 0427   No results found for: HGBA1C No results found for: LABOPIA, COCAINSCRNUR, LABBENZ, AMPHETMU, THCU, LABBARB  No results for input(s): ETH in the last 168 hours.  Dg Chest 2 View 02/27/2015    No active cardiopulmonary disease.     Ct Head (brain) Wo Contrast 02/27/2015    Stable recent infarct medial right pons. Stable atrophy with mild periventricular small vessel disease. No new infarct identified compared to recent MR study. No hemorrhage, mass effect, or extra-axial fluid collection. Status post bilateral mastoidectomies.     MRI HEAD  02/28/2015    1. Stable size and distribution of subacute right paramedian pontine infarct as detailed above.  No hemorrhagic transformation. 2. No new intracranial infarct or other abnormality identified. 3. Chronic small vessel ischemic disease with multiple remote lacunar infarcts as above.    MRA HEAD  02/28/2015    1. No proximal branch arterial occlusion identified within the intracranial circulation. 2. Focal high-grade stenosis within the distal right V4 segment just prior to the vertebrobasilar junction. This is best seen on concomitant neck MRA portion of this exam. 3. Diffuse irregular narrowing of the petrous, cavernous, and supra clinoid right ICA. This is likely related to the hypoplastic right A1 segment, with the anterior cerebral arteries supplied mainly via the left internal carotid  artery system. 4. Multi focal atheromatous irregularity within the distal MCA and PCA branches.    MRA NECK  02/28/2015    1. Multi focal atheromatous irregularity within the proximal left ICA with associated multi focal stenoses of up to approximately 50%. 2. More mild atheromatous disease within the proximal right ICA with associated short segment stenosis of approximately 30%. 3. Short segment high-grade stenosis within the right V4 segment as above. Vertebral arteries otherwise widely patent.      PHYSICAL EXAM Pleasant elderly Caucasian male currently not in distress. . Afebrile. Head is nontraumatic. Neck is supple without bruit.    Cardiac exam no murmur or gallop. Lungs are clear to auscultation. Distal pulses are well felt. Neurological Exam : Awake alert oriented 3. Mild dysarthria. No aphasia. Fundi were not visualized. Vision acuity seems adequate. Extraocular movements are full range without nystagmus. Left lower facial weakness. Tongue midline. Motor system exam mild left upper and lower extremity drift and weakness of intrinsic hand muscles and left grip. 4/5 strength proximally but greater weakness distally in the left hand and left leg. Reflexes symmetric. Plantars downgoing. Coordination slow and slightly impaired on the left. Sensation intact. Gait was not tested. ASSESSMENT/PLAN [name] is a 81 y.o. male with history of HTN, CAD with acute MI 3 in the past and dyslipidemia presenting initially as an OP with MRI positive for pontine stroke. Worsening hemiparesis, ataxia and dysphagia led to a non-physician recommending his go to the hospital 6 days later. He did not receive [facility] at [facility] due to delay in arrival.   Stroke:  Dominant right Pontine infarct that is felt to be  secondary to R vertebral artery stenosis/occlusion. The stroke is subacute and occurred 02/21/2015  Resultant  Dysarthria, dysphagia, left hemiparesis, ataxia  MRI  R paramedian pontine infarct  MRA  head  R VA high grade stenosis  MRA neck L ICA  2D Echo  pending   HgbA1c pending  eliquis for VTE prophylaxis DIET DYS 3 Room service appropriate?: Yes with Assist; Fluid consistency:: Nectar Thick  warfarin prior to admission, changed to   eliquis (apixaban)  Patient and daughter counseled to be compliant with his antithrombotic medications  Ongoing aggressive stroke risk factor management  Therapy recommendations:  CIR  Disposition:  CIR consulted.  Thrombocytopenia  PLT 79  Aspirin stopped  Atrial Fibrillation  Home meds:  Coumadin PTA  INR was elevated on Thurs and coumadin was held.   INR was subtherapeutic at 1.44 on admission  He had his stroke on suprotherapeutic coumadin, therefore, definitely agree to change to Eliquis. Started on 2.5 mg bid and aspirin was stopped.           Continue eliquis at discharge  Hypertension  Home meds:   lisinopril  Stable, slightly elevated  Ok to resume home medications  Hyperlipidemia  Home meds:  crestor 21,   not yest resumed in hospital as pt NPO  LDL 102, goal < 70  Crestor/substitution started  Continue statin at discharge  Other Stroke Risk Factors  Advanced age  Former Cigarette smoker, quit smoking prior to 1980  Hx Stroke/TIA  Coronary artery disease - MI x 3  PVD  Other Active Problems  CKD stage 3, baseline Cr 1.5-1.8  BPH  GERD  Hospital day # [place] for Pager information 02/28/2015 2:01 PM  I have personally examined this patient, reviewed notes, independently viewed imaging studies, participated in medical decision making and plan of care. I have made any additions or clarifications directly to the above note. Agree with note above. This patient has a subacute) pontine infarct etiology likely from symptomatic distal right vertebral artery stenosis as well as known atrial fibrillation. Patient is at significant risk for neurological  worsening, recurrent stroke/TIA and hence ongoing stroke evaluation and aggressive risk factor modification. Patient needs to be an anticoagulation given history of atrial fibrillation. I long discussion with the patient and daughter regarding available literature on treatment for symptomatic intracranial stenosis suggesting aggressive medical therapy being superior to stenting at the present time.  Antony Contras, MD Medical Director [facility] Pager: [phone] 02/28/2015 2:12 PM    To contact Stroke Continuity provider, please refer to [email or web address] After hours, contact General Neurology

## 2015-02-28 NOTE — Progress Notes (Signed)
CHL IP CLINICAL IMPRESSIONS 02/28/2015  Dysphagia Diagnosis Moderate oral phase dysphagia;Moderate pharyngeal phase dysphagia  Clinical impression Moderate oropharyngeal dysphagia with sensorimotor deficits resulting in delayed oral transiting, delayed pharyngeal swallow response.  Gross aspiration of nectar liquid via cup noted due to delayed swallow response - barium fills pyriform sinus and spilled into open airway prior to swallow.  Trace aspiration of thin via straw with chin tuck noted.  Pt did not clear aspirates even with strong reflexive coughing.  Chin tuck posture effective to prevent aspiration with all consistencies except straw boluses of thin.  Cued dry swallows helpful to decreased oropharyngeal residuals.   Recommend to initiate a dys3/nectar diet, allowing tsps of thin water between meals after oral care with very strict aspiration precautions.    Using live video and teach back, pt and daughter educated to findings and effective strategies reinforced.     CHL IP TREATMENT RECOMMENDATION 02/28/2015  Treatment Plan Recommendations Therapy as outlined in treatment plan below    CHL IP DIET RECOMMENDATION 02/28/2015  Diet Recommendations Dysphagia 3 (Mechanical Soft);Nectar-thick liquid, tsps thin WATER between meals ok  Liquid Administration via Cup;Straw  Medication Administration Crushed with puree  Compensations Multiple dry swallows after each bite/sip;Slow rate;Small sips/bites;Check for pocketing  Postural Changes and/or Swallow Maneuvers Chin tuck with EVERY bite/sip Set up oral suction     Luanna Salk, [facility] SLP [phone]

## 2015-02-28 NOTE — Evaluation (Signed)
Clinical/Bedside Swallow Evaluation Patient Details  Name: Stanley Robles MRN: [id number] Date of Birth: August 06, 1932  Today's Date: 02/28/2015 Time: SLP Start Time (ACUTE ONLY): 0755 SLP Stop Time (ACUTE ONLY): 0820 SLP Time Calculation (min) (ACUTE ONLY): 25 min  Past Medical History:  Past Medical History  Diagnosis Date  . Hyperlipidemia   . Peripheral vascular disease   . Coronary artery disease     DR. TILLEY IS PT'S CARDIOLGIST  . Myocardial infarction 1994  . Gout     USUALLY IN THE FEET  . BPH (benign prostatic hypertrophy)   . GERD (gastroesophageal reflux disease)     ROLAIDS IF NEEDED  . 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 Tooth disease     PT STATES HE IS SCHEDULED TO HAVE BAD TOOTH PULLED 03/24/13 - WILL CHECK WITH DR. Wynelle Link TO MAKE SURE THIS IS OK TO DO BEFORE HIS PLANNED KNEE REPLACEMENT ON 5/5.  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 Chronic kidney disease stage 3   . Hypertensive heart disease   . Paroxysmal atrial fibrillation 04/01/2013  . Osteoarthritis    Past Surgical History:  Past Surgical History  Procedure Laterality Date  . Mastoid surgery x 5    . Prostate surgery    . Hernia repair  12/26/2002    BILATERAL INGUINAL HERNIA REPAIR  . Cardiac catheterization  07/21/2005  . Total knee arthroplasty Left 03/28/2013    Procedure: LEFT TOTAL KNEE ARTHROPLASTY;  Surgeon: Gearlean Alf, MD;  Location: [facility] ORS;  Service: Orthopedics;  Laterality: Left;   HPI:  81 yo male adm with left sided weakness, worsening coughing with po/dysphagia diagnosed with right pontine CVA.  PMH + for MI, knee replacement.  Pt on blood thinner.  Swallow evaluation and speech evaluation ordered.     Assessment / Plan / Recommendation Clinical Impression  Pt presents with concerns for oropharyngeal dysphagia and possible laryngeal penetration/aspiration.  Obvious CN deficits impacting  facial, suspected glossopharyngeal, ? vagal nerve given decreased gag response.  Pt with baseline cough on secretions noted - but cough worsened toward end of minimal snack (4 ounces applesauce, 4 ounces water, 4 crackers). Oral residuals of solids noted on left with poor awareness.  Pt also noted to orally hold po with delayed swallow - required cues to swallow.  Dysphagia x1 week noted per pt- and CXR negative as of now.  Daughter reports pt with drooling on left prior to admit and extreme coughing with intake.  Pt stated liquids should be easier to swallow and SLP explained clinical reasoning for liquids to be more difficult.  Also note pt with h/o GERD but he reports this to be improved lately.  Will proceed with MBS to allow instrumental evaluation.  Daughter and pt educated to recommendations and both agree.     Aspiration Risk  Severe    Diet Recommendation NPO   Medication Administration: Via alternative means    Other  Recommendations Recommended Consults: MBS   Follow Up Recommendations   (tbd)    Frequency and Duration min 2x/week  2 weeks   Pertinent Vitals/Pain Afebrile, decreased    SLP Swallow Goals  TBD  Swallow Study Prior Functional Status   Pt lives alone and family lives close to him - all daughter work full time per     General Date of Onset: 02/28/15 HPI: 81 yo male adm with left  sided weakness, worsening coughing with po/dysphagia diagnosed with right pontine CVA.  PMH + for MI, knee replacement.  Pt on blood thinner.  Swallow evaluation and speech evaluation ordered.   Type of Study: Bedside swallow evaluation Diet Prior to this Study: NPO Temperature Spikes Noted: Yes Respiratory Status: Room air History of Recent Intubation: No Behavior/Cognition: Alert;Cooperative;Pleasant mood;Hard of hearing Oral Cavity - Dentition: Adequate natural dentition Self-Feeding Abilities: Able to feed self Patient Positioning: Upright in bed Volitional Cough:  Strong Volitional Swallow: Able to elicit    Oral/Motor/Sensory Function Overall Oral Motor/Sensory Function: Impaired Labial ROM: Reduced left Labial Symmetry: Abnormal symmetry left Labial Strength: Reduced Labial Sensation: Reduced Lingual ROM: Reduced left Lingual Strength: Reduced Lingual Sensation: Reduced Facial ROM: Reduced left Facial Sensation: Reduced Velum:  (sluggish) Mandible: Within Functional Limits   Ice Chips Ice chips: Not tested   Thin Liquid Thin Liquid: Impaired Presentation: Cup;Self Fed;Straw Oral Phase Impairments: Reduced lingual movement/coordination;Impaired anterior to posterior transit Oral Phase Functional Implications: Other (comment) (delay oral transiting) Pharyngeal  Phase Impairments: Throat Clearing - Delayed;Cough - Delayed    Nectar Thick Nectar Thick Liquid: Not tested   Honey Thick Honey Thick Liquid: Not tested   Puree Puree: Impaired Presentation: Self Fed;Spoon Oral Phase Impairments: Reduced lingual movement/coordination;Impaired anterior to posterior transit Oral Phase Functional Implications: Prolonged oral transit Pharyngeal Phase Impairments: Suspected delayed Swallow   Solid   GO    Solid: Impaired Presentation: Self Fed Oral Phase Impairments: Impaired anterior to posterior transit;Reduced lingual movement/coordination;Impaired mastication Oral Phase Functional Implications: Other (comment);Left lateral sulci pocketing Pharyngeal Phase Impairments: Suspected delayed Swallow;Cough - Delayed      Luanna Salk, [facility] SLP [phone]

## 2015-02-28 NOTE — Progress Notes (Signed)
  Echocardiogram 2D Echocardiogram has been performed.  Lysle Rubens 02/28/2015, 12:53 PM

## 2015-02-28 NOTE — Progress Notes (Signed)
ANTICOAGULATION CONSULT NOTE - Initial Consult  Pharmacy Consult:  Eliquis Indication:  Nonvalvular Afib  No Known Allergies  Patient Measurements: Height: 5\' 7"  (170.2 cm) Weight: 180 lb (81.647 kg) IBW/kg (Calculated) : 66.1  Vital Signs: Temp: 97.6 F (36.4 C) (04/06 0500) Temp Source: Oral (04/06 0500) BP: 135/55 mmHg (04/06 0500) Pulse Rate: 60 (04/06 0500)  Labs:  Recent Labs  02/27/15 1210 02/27/15 1219 02/28/15 0427  HGB 17.3* 16.7 16.1  HCT 49.0 49.0 47.2  PLT 100*  --  79*  APTT 34  --   --   LABPROT 17.6*  --   --   INR 1.44  --   --   CREATININE 1.59* 1.50* 1.51*    Estimated Creatinine Clearance: 39.9 mL/min (by C-G formula based on Cr of 1.51).   Medical History: Past Medical History  Diagnosis Date  . Hyperlipidemia   . Peripheral vascular disease   . Coronary artery disease     DR. TILLEY IS PT'S CARDIOLGIST  . Myocardial infarction 1994  . Gout     USUALLY IN THE FEET  . BPH (benign prostatic hypertrophy)   . GERD (gastroesophageal reflux disease)     ROLAIDS IF NEEDED  . 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 Tooth disease     PT STATES HE IS SCHEDULED TO HAVE BAD TOOTH PULLED 03/24/13 - WILL CHECK WITH DR. Wynelle Link TO MAKE SURE THIS IS OK TO DO BEFORE HIS PLANNED KNEE REPLACEMENT ON 5/5.  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 Chronic kidney disease stage 3   . Hypertensive heart disease   . Paroxysmal atrial fibrillation 04/01/2013  . Osteoarthritis        Assessment: 23 YOM with history of Afib and TIA on Coumadin PTA.  Patient's INR was sub-therapeutic on admit and found to have a new CVA.  Now to start Eliquis.  He has CKD with creatinine ranging from 1.5-1.8 since 2014.  Patient's platelet count is low at 79, his range is 91-149 since 2014.  No bleeding reported.   Goal of Therapy:  Full anticoagulation    Plan:  - Eliquis 2.5mg  PO  BID - D/C ASA given low plts per discussion with Dr. Candiss Norse - CBC Q72H x 3 - Consider resuming home meds if patient passes MBS - Pharmacy will sign off and follow peripherally.  Thank you for the consult!    Lerone Onder D. Mina Marble, PharmD, BCPS Pager:  [phone] 02/28/2015, 9:52 AM

## 2015-02-28 NOTE — Progress Notes (Signed)
Medication intervention: Statin  Lipitor 20mg  ordered daily ; however, patient does not tolerate due to constipation, and is currently on crestor 20mg  every other day at home.  Discussed with Dr. Candiss Norse, he advised to change statin to crestor 20mg  daily.  Thanks, Isac Sarna, BS Pharm D, BCPS Clinical Pharmacist

## 2015-03-01 ENCOUNTER — Encounter (HOSPITAL_COMMUNITY): Payer: Self-pay | Admitting: Cardiology

## 2015-03-01 LAB — HEMOGLOBIN A1C
Hgb A1c MFr Bld: 5.8 % — ABNORMAL HIGH (ref 4.8–5.6)
MEAN PLASMA GLUCOSE: 120 mg/dL

## 2015-03-01 MED ORDER — FINASTERIDE 5 MG PO TABS
5.0000 mg | ORAL_TABLET | Freq: Every day | ORAL | Status: DC
  Administered 2015-03-01 – 2015-03-02 (×2): 5 mg via ORAL
  Filled 2015-03-01 (×2): qty 1

## 2015-03-01 MED ORDER — DOCUSATE SODIUM 100 MG PO CAPS
200.0000 mg | ORAL_CAPSULE | Freq: Two times a day (BID) | ORAL | Status: DC
  Administered 2015-03-01 – 2015-03-02 (×3): 200 mg via ORAL
  Filled 2015-03-01 (×3): qty 2

## 2015-03-01 MED ORDER — PANTOPRAZOLE SODIUM 40 MG PO TBEC
40.0000 mg | DELAYED_RELEASE_TABLET | Freq: Every day | ORAL | Status: DC
  Administered 2015-03-01 – 2015-03-02 (×2): 40 mg via ORAL
  Filled 2015-03-01 (×3): qty 1

## 2015-03-01 MED ORDER — TOBRAMYCIN 0.3 % OP SOLN
2.0000 [drp] | Freq: Four times a day (QID) | OPHTHALMIC | Status: DC
  Administered 2015-03-01 – 2015-03-02 (×5): 2 [drp] via OPHTHALMIC
  Filled 2015-03-01: qty 5

## 2015-03-01 MED ORDER — NEPAFENAC 0.3 % OP SUSP
1.0000 [drp] | Freq: Every day | OPHTHALMIC | Status: DC
  Administered 2015-03-01 – 2015-03-02 (×2): 1 [drp] via OPHTHALMIC
  Filled 2015-03-01: qty 3

## 2015-03-01 MED ORDER — COLCHICINE 0.6 MG PO TABS
0.6000 mg | ORAL_TABLET | Freq: Every day | ORAL | Status: DC
  Administered 2015-03-01 – 2015-03-02 (×2): 0.6 mg via ORAL
  Filled 2015-03-01 (×2): qty 1

## 2015-03-01 MED ORDER — ALLOPURINOL 100 MG PO TABS
300.0000 mg | ORAL_TABLET | Freq: Every day | ORAL | Status: DC
  Administered 2015-03-01 – 2015-03-02 (×2): 300 mg via ORAL
  Filled 2015-03-01 (×3): qty 3

## 2015-03-01 NOTE — Progress Notes (Signed)
Physical Therapy Treatment Patient Details Name: Stanley Robles MRN: [id number] DOB: 24-Nov-1932 Today's Date: 03/01/2015    History of Present Illness Pt is an 81 yo male who was brought to ED for disathria and L sided weakness. 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    PT Comments    Patient progressing slowly with mobility. Able to improve ambulation distance and balance during gait training by providing proximal cues at pelvis which decreased left lateral trunk lean. Discharge recommendation and frequency updated to 3x/week and ST SNF as pt lives alone and will not have 24/7 at d/c. Pt agreeable. Will continue to follow per current POC.   Follow Up Recommendations  SNF;Supervision/Assistance - 24 hour     Equipment Recommendations  None recommended by PT    Recommendations for Other Services       Precautions / Restrictions Precautions Precautions: Fall Restrictions Weight Bearing Restrictions: No    Mobility  Bed Mobility Overal bed mobility: Needs Assistance Bed Mobility: Supine to Sit     Supine to sit: Min assist;HOB elevated Sit to supine: Min assist;HOB elevated   General bed mobility comments: Use of bed rails. Cues for technique.  Transfers Overall transfer level: Needs assistance Equipment used: None Transfers: Sit to/from Stand Sit to Stand: Min assist         General transfer comment: Min A to rise from EOB with cues for hand placement and anterior translation. Manual cues to adjust for left lateral ltrunk lean. Stood from [employer], from chair x2.   Ambulation/Gait Ambulation/Gait assistance: Max assist;+2 physical assistance;+2 safety/equipment;Mod assist Ambulation Distance (Feet): 75 Feet (x2 bouts) Assistive device: 1 person hand held assist Gait Pattern/deviations: Decreased step length - left;Decreased stride length;Decreased stance time - left;Step-through pattern Gait velocity: decreased   General Gait Details: Initially Max A of 1 for  balance/sequencing of steps due to severe left lateral trunk lean progressing to Mod A of 2 with therapist providing proximal assist/cues at pelvis to allow for more fluid LE kinematics. Lean improved with hand held assist on right side.   Stairs            Wheelchair Mobility    Modified Rankin (Stroke Patients Only) Modified Rankin (Stroke Patients Only) Pre-Morbid Rankin Score: No symptoms Modified Rankin: Moderately severe disability     Balance Overall balance assessment: Needs assistance Sitting-balance support: Feet supported;No upper extremity supported Sitting balance-Leahy Scale: Fair     Standing balance support: During functional activity Standing balance-Leahy Scale: Poor                      Cognition Arousal/Alertness: Awake/alert Behavior During Therapy: WFL for tasks assessed/performed Overall Cognitive Status: Within Functional Limits for tasks assessed                      Exercises      General Comments        Pertinent Vitals/Pain Pain Assessment: No/denies pain    Home Living                      Prior Function            PT Goals (current goals can now be found in the care plan section) Progress towards PT goals: Progressing toward goals    Frequency  Min 3X/week    PT Plan Discharge plan needs to be updated;Frequency needs to be updated    Co-evaluation  End of Session Equipment Utilized During Treatment: Gait belt Activity Tolerance: Patient tolerated treatment well Patient left: in bed;with call bell/phone within reach;with bed alarm set     Time: 1404-1430 PT Time Calculation (min) (ACUTE ONLY): 26 min  Charges:  $Gait Training: 23-37 mins                    G CodesCandy Sledge A 2015/03/31, 3:22 PM  Candy Sledge, [place], DPT [phone]

## 2015-03-01 NOTE — Progress Notes (Signed)
Rehab admissions - Evaluated for possible admission.  I met with patient.  He lives alone and his daughters work.  He says he went to [facility] in the past and also to a SNF in [place].  He liked the facility in [place].  He has [employer]  Likely he will need placement since he lives alone and family works.  I will call his daughters.  Call me for questions.  #[phone]

## 2015-03-01 NOTE — Progress Notes (Signed)
Patient Demographics  Stanley Robles, is a 81 y.o. male, DOB - 1932-06-26, POE:[id number]  Admit date - 02/27/2015   Admitting Physician Verlee Monte, MD  Outpatient Primary MD for the patient is VYAS,DHRUV B., MD  LOS - 2   Chief Complaint  Patient presents with  . Weakness        Subjective:   Stanley Robles today has, No headache, No chest pain, No abdominal pain - No Nausea, No new weakness tingling or numbness, No Cough - SOB.    Assessment & Plan    1. L-sided weakness with MRI evidence of subacute right paramedian infarct - likely occurred 3 days prior to admission, monitor on tele, obtain PT, OT, speech input, pending A1c & carotid duplex, neurology following. Switched to [employer]. Statin added as LDL was above goal. Speech therapy following. Currently on dysphagia 3 diet with nectar thick liquids.   No results found for: HGBA1C  Lab Results  Component Value Date   CHOL 162 02/28/2015   HDL 31* 02/28/2015   LDLCALC 102* 02/28/2015   TRIG 145 02/28/2015   CHOLHDL 5.2 02/28/2015     2. Dyslipidemia. Placed on statin with increased dose.   3.CKD 3 - at abseline.   4.HTN - permissive hypertension for #1 above.   5. Paroxysmal atrial fibrillation. Currently in sinus, Coumadin switched to Eliquis.   6. GERD. Placed on PPI.   7. Gout. Resume home medications.   8. BPH. Resume alpha blocker at home dose.      Code Status: Full  Family Communication: Daughter  Disposition Plan: SNF   Procedures   MRI/MRA brain, echogram, carotid duplex  TTE  - Left ventricle: The cavity size was normal. There was mildconcentric hypertrophy. Systolic function was vigorous. Theestimated ejection fraction was in the range of 65% to 70%. Wallmotion was normal; there were no  regional wall motionabnormalities. - Aortic valve: There was mild regurgitation. - Left atrium: The atrium was mildly dilated. - Right atrium: The atrium was mildly dilated. - Pulmonary arteries: Systolic pressure was mildly increased. PApeak pressure: 35 mm Hg (S).  Impressions: - No cardiac source of emboli was indentified.    Carotids   Consults Neuro   Medications  Scheduled Meds: . apixaban  2.5 mg Oral BID  . rosuvastatin  20 mg Oral q1800   Continuous Infusions: . sodium chloride 75 mL/hr (02/28/15 2150)   PRN Meds:.metoprolol, RESOURCE THICKENUP CLEAR  DVT Prophylaxis  Eliquis  Lab Results  Component Value Date   PLT 79* 02/28/2015    Antibiotics     Anti-infectives    None          Objective:   Filed Vitals:   02/28/15 1731 02/28/15 2158 03/01/15 0205 03/01/15 0541  BP: 179/47 142/53 171/60 173/70  Pulse: 61 56 57 50  Temp: 98.2 F (36.8 C) 97.7 F (36.5 C) 97.4 F (36.3 C) 97.4 F (36.3 C)  TempSrc: Oral Oral Oral Oral  Resp: 20 18 16 20   Height:      Weight:      SpO2: 96% 95% 95% 95%    Wt Readings from Last 3 Encounters:  02/27/15 81.647 kg (180 lb)  04/04/13 79.2 kg (174 lb 9.7 oz)  Intake/Output Summary (Last 24 hours) at 03/01/15 0914 Last data filed at 03/01/15 0700  Gross per 24 hour  Intake    360 ml  Output   1650 ml  Net  -1290 ml     Physical Exam  Awake Alert, Oriented X 3, No new F.N deficits, Normal affect [place].AT,PERRAL Supple Neck,No JVD, No cervical lymphadenopathy appriciated.  Symmetrical Chest wall movement, Good air movement bilaterally, CTAB RRR,No Gallops,Rubs or new Murmurs, No Parasternal Heave +ve B.Sounds, Abd Soft, No tenderness, No organomegaly appriciated, No rebound - guarding or rigidity. No Cyanosis, Clubbing or edema, No new Rash or bruise      Data Review   Micro Results No results found for this or any previous visit (from the past 240 hour(s)).  Radiology Reports Dg Chest 2  View  02/27/2015   CLINICAL DATA:  Cough and left-sided weakness.  EXAM: CHEST  2 VIEW  COMPARISON:  08/29/2014  FINDINGS: Shallow inspiration. Heart size and pulmonary vascularity are borderline but likely normal for technique. No focal airspace disease or consolidation in the lungs. No blunting of costophrenic angles. No pneumothorax. Calcification of the aorta. Degenerative changes in the spine.  IMPRESSION: No active cardiopulmonary disease.   Electronically Signed   By: Lucienne Capers M.D.   On: 02/27/2015 23:34   Ct Head (brain) Wo Contrast  02/27/2015   CLINICAL DATA:  Increase left-sided weakness and facial droop. Patient with documented pontine infarct on the right on recent MR.  EXAM: CT HEAD WITHOUT CONTRAST  TECHNIQUE: Contiguous axial images were obtained from the base of the skull through the vertex without intravenous contrast.  COMPARISON:  Brain MRI February 22, 2015  FINDINGS: Mild diffuse atrophy is stable. There is no intracranial mass, hemorrhage, extra-axial fluid collection, or midline shift. The recent infarct in the medial right pons is seen on CT than does not appear significantly changed compared to recent MR study. There is mild small vessel disease in the centra semiovale bilaterally. No new gray-white compartment lesion is identified. No acute infarct in addition to the acute infarct in the right pons noted on recent MR is seen. The bony calvarium appears intact. The patient has had mastoidectomies bilaterally.  IMPRESSION: Stable recent infarct medial right pons. Stable atrophy with mild periventricular small vessel disease. No new infarct identified compared to recent MR study. No hemorrhage, mass effect, or extra-axial fluid collection. Status post bilateral mastoidectomies.   Electronically Signed   By: Lowella Grip III M.D.   On: 02/27/2015 14:24   Mr Angiogram Neck W Wo Contrast  02/28/2015   EXAM: MRI HEAD WITHOUT AND WITH CONTRAST AND MRA HEAD WITHOUT CONTRAST AND MRI  NECK WITHOUT AND WITH CONTRAST  TECHNIQUE: Multiplanar, multiecho pulse sequences of the brain and surrounding structures were obtained without and with intravenous contrast. Angiographic images of the head were obtained using MRA technique without and with contrast. Multiplanar, multiecho pulse sequences of the neck and surrounding structures were obtained without and with intravenous contrast.  CONTRAST:  7mL MULTIHANCE GADOBENATE DIMEGLUMINE 529 MG/ML IV SOLN  COMPARISON:  None.  FINDINGS: MRI HEAD FINDINGS  There is persistent abnormal restricted diffusion in region of recently identified right pontine infarct. Overall, the size and distribution of this infarct is not significantly changed relative to prior study. The area of infarction measures approximately 12 x 14 x 10 mm. Associated ADC signal abnormality has nearly normalized. Additionally, there is patchy post-contrast enhancement within this region (Series 17, image 19). Constellation of findings most  compatible with a subacute infarct.  No other abnormal foci of restricted diffusion to suggest acute intracranial infarct identified. Gray-white matter differentiation otherwise maintained. Probable tiny remote lacunar infarct present within the right cerebellar hemisphere. Tiny remote lacunar infarcts also present within the thalami bilaterally. Tiny remote left pontine infarct also noted.  No mass lesion or midline shift. Very mild chronic small vessel ischemic changes noted. No acute or chronic intracranial hemorrhage. No hydrocephalus. No extra-axial fluid collection.  Craniocervical junction within normal limits. Pituitary gland unremarkable. No acute abnormality about the orbits. Sequelae of prior cataract extraction on the right. Very mild mucoperiosteal thickening present within the left maxillary sinus. 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 No mastoid effusion. Inner ear structures normal.  Degenerative changes noted within the upper  cervical spine. Bone marrow signal intensity normal. Scalp soft tissues unremarkable.  No other abnormal enhancement within the brain.  MRA HEAD FINDINGS  ANTERIOR CIRCULATION:  Visualized distal portions of the cervical segments of the internal carotid arteries are widely patent bilaterally with antegrade flow. The petrous, cavernous, and supra clinoid segment of the left ICA are widely patent, although there is minimal atheromatous irregularity within the proximal cavernous segment. There is diffuse narrowing of the distal petrous, cavernous, and supra clinoid right ICA. This is likely related to a hypoplastic right A1 segment. The anterior cerebral arteries are supplied primarily via a widely patent dominant left A1 segment. The anterior cerebral arteries themselves are widely patent and well opacified.  M1 segments patent bilaterally without focal stenosis or occlusion. MCA bifurcations within normal limits. There is distal branch atheromatous irregularity within the MCA branches bilaterally, right slightly worse than left.  POSTERIOR CIRCULATION:  Vertebral arteries are patent bilaterally to the level of the vertebrobasilar junction. There is atheromatous irregularity within the somewhat diminutive right vertebral artery with superimposed short segment severe stenosis. This is better seen on concomitant MRA of the neck (series 11607, image 5). The left vertebral artery is slightly dominant. Posterior inferior cerebellar arteries are patent proximally. There is mild multi focal atheromatous irregularity within the basilar artery without focal high-grade stenosis or occlusion. Superior cerebellar arteries patent proximally. P1 segments patent bilaterally. The left P1 segment is slightly hypoplastic with a prominent left posterior communicating artery. Distal branch atheromatous irregularity present within the PCA branches bilaterally. The distal left P2 segment is not well visualized, and may be occluded.  No  aneurysm or vascular malformation.  MRI NECK FINDINGS  Visualized portions of the aortic arch are within normal limits with normal 3 vessel morphology. No high-grade stenosis seen at the origin of the great vessels. Visualized subclavian arteries are patent bilaterally.  The right common carotid artery is well opacified to the carotid bifurcation. There is focal atheromatous plaque within the proximal right ICA with associated short segment stenosis of approximately 30%. Distally, the right ICA is well opacified.  The left common carotid artery is widely patent to the carotid bifurcation, although there is multi focal irregular plaque within the distal left common carotid artery without focal stenosis. Atheromatous irregularity and plaque present about the left carotid bifurcation, extending into the proximal left ICA. There is associated multi focal atheromatous irregularity within the proximal left ICA with superimposed stenoses of up to approximately 50%. The distal left ICA is opacified to the skullbase.  Both vertebral arteries arise from the subclavian arteries. Vertebral arteries are fairly well opacified along their entire course to the level of the dura without focal stenosis, dissection, or occlusion. There  is a superimposed high-grade stenosis within the distal right V4 segment just prior to the vertebrobasilar junction.  IMPRESSION: MRI HEAD IMPRESSION:  1. Stable size and distribution of subacute right paramedian pontine infarct as detailed above. No hemorrhagic transformation. 2. No new intracranial infarct or other abnormality identified. 3. Chronic small vessel ischemic disease with multiple remote lacunar infarcts as above.  MRA HEAD IMPRESSION:  1. No proximal branch arterial occlusion identified within the intracranial circulation. 2. Focal high-grade stenosis within the distal right V4 segment just prior to the vertebrobasilar junction. This is best seen on concomitant neck MRA portion of this  exam. 3. Diffuse irregular narrowing of the petrous, cavernous, and supra clinoid right ICA. This is likely related to the hypoplastic right A1 segment, with the anterior cerebral arteries supplied mainly via the left internal carotid artery system. 4. Multi focal atheromatous irregularity within the distal MCA and PCA branches.  MRA NECK IMPRESSION:  1. Multi focal atheromatous irregularity within the proximal left ICA with associated multi focal stenoses of up to approximately 50%. 2. More mild atheromatous disease within the proximal right ICA with associated short segment stenosis of approximately 30%. 3. Short segment high-grade stenosis within the right V4 segment as above. Vertebral arteries otherwise widely patent.   Electronically Signed   By: Jeannine Boga M.D.   On: 02/28/2015 01:49   Mr Jeri Cos TD Contrast  02/28/2015   EXAM: MRI HEAD WITHOUT AND WITH CONTRAST AND MRA HEAD WITHOUT CONTRAST AND MRI NECK WITHOUT AND WITH CONTRAST  TECHNIQUE: Multiplanar, multiecho pulse sequences of the brain and surrounding structures were obtained without and with intravenous contrast. Angiographic images of the head were obtained using MRA technique without and with contrast. Multiplanar, multiecho pulse sequences of the neck and surrounding structures were obtained without and with intravenous contrast.  CONTRAST:  59mL MULTIHANCE GADOBENATE DIMEGLUMINE 529 MG/ML IV SOLN  COMPARISON:  None.  FINDINGS: MRI HEAD FINDINGS  There is persistent abnormal restricted diffusion in region of recently identified right pontine infarct. Overall, the size and distribution of this infarct is not significantly changed relative to prior study. The area of infarction measures approximately 12 x 14 x 10 mm. Associated ADC signal abnormality has nearly normalized. Additionally, there is patchy post-contrast enhancement within this region (Series 17, image 19). Constellation of findings most compatible with a subacute infarct.  No  other abnormal foci of restricted diffusion to suggest acute intracranial infarct identified. Gray-white matter differentiation otherwise maintained. Probable tiny remote lacunar infarct present within the right cerebellar hemisphere. Tiny remote lacunar infarcts also present within the thalami bilaterally. Tiny remote left pontine infarct also noted.  No mass lesion or midline shift. Very mild chronic small vessel ischemic changes noted. No acute or chronic intracranial hemorrhage. No hydrocephalus. No extra-axial fluid collection.  Craniocervical junction within normal limits. Pituitary gland unremarkable. No acute abnormality about the orbits. Sequelae of prior cataract extraction on the right. Very mild mucoperiosteal thickening present within the left maxillary sinus. 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 No mastoid effusion. Inner ear structures normal.  Degenerative changes noted within the upper cervical spine. Bone marrow signal intensity normal. Scalp soft tissues unremarkable.  No other abnormal enhancement within the brain.  MRA HEAD FINDINGS  ANTERIOR CIRCULATION:  Visualized distal portions of the cervical segments of the internal carotid arteries are widely patent bilaterally with antegrade flow. The petrous, cavernous, and supra clinoid segment of the left ICA are widely patent, although there is minimal atheromatous irregularity  within the proximal cavernous segment. There is diffuse narrowing of the distal petrous, cavernous, and supra clinoid right ICA. This is likely related to a hypoplastic right A1 segment. The anterior cerebral arteries are supplied primarily via a widely patent dominant left A1 segment. The anterior cerebral arteries themselves are widely patent and well opacified.  M1 segments patent bilaterally without focal stenosis or occlusion. MCA bifurcations within normal limits. There is distal branch atheromatous irregularity within the MCA branches bilaterally, right  slightly worse than left.  POSTERIOR CIRCULATION:  Vertebral arteries are patent bilaterally to the level of the vertebrobasilar junction. There is atheromatous irregularity within the somewhat diminutive right vertebral artery with superimposed short segment severe stenosis. This is better seen on concomitant MRA of the neck (series 11607, image 5). The left vertebral artery is slightly dominant. Posterior inferior cerebellar arteries are patent proximally. There is mild multi focal atheromatous irregularity within the basilar artery without focal high-grade stenosis or occlusion. Superior cerebellar arteries patent proximally. P1 segments patent bilaterally. The left P1 segment is slightly hypoplastic with a prominent left posterior communicating artery. Distal branch atheromatous irregularity present within the PCA branches bilaterally. The distal left P2 segment is not well visualized, and may be occluded.  No aneurysm or vascular malformation.  MRI NECK FINDINGS  Visualized portions of the aortic arch are within normal limits with normal 3 vessel morphology. No high-grade stenosis seen at the origin of the great vessels. Visualized subclavian arteries are patent bilaterally.  The right common carotid artery is well opacified to the carotid bifurcation. There is focal atheromatous plaque within the proximal right ICA with associated short segment stenosis of approximately 30%. Distally, the right ICA is well opacified.  The left common carotid artery is widely patent to the carotid bifurcation, although there is multi focal irregular plaque within the distal left common carotid artery without focal stenosis. Atheromatous irregularity and plaque present about the left carotid bifurcation, extending into the proximal left ICA. There is associated multi focal atheromatous irregularity within the proximal left ICA with superimposed stenoses of up to approximately 50%. The distal left ICA is opacified to the  skullbase.  Both vertebral arteries arise from the subclavian arteries. Vertebral arteries are fairly well opacified along their entire course to the level of the dura without focal stenosis, dissection, or occlusion. There is a superimposed high-grade stenosis within the distal right V4 segment just prior to the vertebrobasilar junction.  IMPRESSION: MRI HEAD IMPRESSION:  1. Stable size and distribution of subacute right paramedian pontine infarct as detailed above. No hemorrhagic transformation. 2. No new intracranial infarct or other abnormality identified. 3. Chronic small vessel ischemic disease with multiple remote lacunar infarcts as above.  MRA HEAD IMPRESSION:  1. No proximal branch arterial occlusion identified within the intracranial circulation. 2. Focal high-grade stenosis within the distal right V4 segment just prior to the vertebrobasilar junction. This is best seen on concomitant neck MRA portion of this exam. 3. Diffuse irregular narrowing of the petrous, cavernous, and supra clinoid right ICA. This is likely related to the hypoplastic right A1 segment, with the anterior cerebral arteries supplied mainly via the left internal carotid artery system. 4. Multi focal atheromatous irregularity within the distal MCA and PCA branches.  MRA NECK IMPRESSION:  1. Multi focal atheromatous irregularity within the proximal left ICA with associated multi focal stenoses of up to approximately 50%. 2. More mild atheromatous disease within the proximal right ICA with associated short segment stenosis of approximately 30%. 3. Short  segment high-grade stenosis within the right V4 segment as above. Vertebral arteries otherwise widely patent.   Electronically Signed   By: Jeannine Boga M.D.   On: 02/28/2015 01:49   Dg Swallowing Func-speech Pathology  02/28/2015    Objective Swallowing Evaluation:    Patient Details  Name: Stanley Robles MRN: [id number] Date of Birth: 05-Feb-1932  Today's Date: 02/28/2015 Time: SLP  Start Time (ACUTE ONLY): 0955-SLP Stop Time (ACUTE ONLY): 1036 SLP Time Calculation (min) (ACUTE ONLY): 41 min  Past Medical History:  Past Medical History  Diagnosis Date  . Hyperlipidemia   . Peripheral vascular disease   . Coronary artery disease     DR. TILLEY IS PT'S CARDIOLGIST  . Myocardial infarction 1994  . Gout     USUALLY IN THE FEET  . BPH (benign prostatic hypertrophy)   . GERD (gastroesophageal reflux disease)     ROLAIDS IF NEEDED  . 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 Tooth disease     PT STATES HE IS SCHEDULED TO HAVE BAD TOOTH PULLED 03/24/13 - WILL CHECK  WITH DR. Wynelle Link TO MAKE SURE THIS IS OK TO DO BEFORE HIS PLANNED KNEE  REPLACEMENT ON 5/5.  Marland Kitchen Personal history of transient ischemic attack (TIA) and cerebral  infarction without residual deficit   . Chronic kidney disease stage 3   . Hypertensive heart disease   . Paroxysmal atrial fibrillation 04/01/2013  . Osteoarthritis    Past Surgical History:  Past Surgical History  Procedure Laterality Date  . Mastoid surgery x 5    . Prostate surgery    . Hernia repair  12/26/2002    BILATERAL INGUINAL HERNIA REPAIR  . Cardiac catheterization  07/21/2005  . Total knee arthroplasty Left 03/28/2013    Procedure: LEFT TOTAL KNEE ARTHROPLASTY;  Surgeon: Gearlean Alf, MD;   Location: [facility];  Service: Orthopedics;  Laterality: Left;   HPI:  HPI: 81 yo male adm with left sided weakness, worsening coughing with  po/dysphagia recently diagnosed with right pontine CVA.  PMH + for MI,  knee replacement.  Pt on blood thinner.  Swallow evaluation and speech  evaluation ordered.    No Data Recorded  Assessment / Plan / Recommendation CHL IP CLINICAL IMPRESSIONS 02/28/2015  Dysphagia Diagnosis Moderate oral phase dysphagia;Moderate pharyngeal  phase dysphagia  Clinical impression Moderate oropharyngeal dysphagia with sensorimotor  deficits resulting in delayed oral transiting, delayed pharyngeal swallow  response.  Gross aspiration of nectar  liquid via cup noted due to delayed  swallow response - barium fills pyriform sinus and spilled into open  airway prior to swallow.  Trace aspiration of thin via straw with chin  tuck noted.  Pt did not clear aspirates even with strong reflexive  coughing.  Chin tuck posture effective to prevent aspiration with all  consistencies except straw boluses of thin.  Cued dry swallows helpful to  decreased oropharyngeal resiudals.   Recommend to initiate a dys3/nectar diet, allowing tsps of thin water  between meals after oral care with very strict aspiration precautions.        CHL IP TREATMENT RECOMMENDATION 02/28/2015  Treatment Plan Recommendations Therapy as outlined in treatment plan below      CHL IP DIET RECOMMENDATION 02/28/2015  Diet Recommendations Dysphagia 3 (Mechanical Soft);Nectar-thick liquid  Liquid Administration via Cup;Straw  Medication Administration Crushed with puree  Compensations Multiple dry swallows after each bite/sip;Slow rate;Small  sips/bites;Check for pocketing  Postural Changes and/or  Swallow Maneuvers Chin tuck     CHL IP OTHER RECOMMENDATIONS 02/28/2015  Recommended Consults MBS  Oral Care Recommendations Oral care Q4 per protocol  Other Recommendations Order thickener from pharmacy;Have oral suction  available     CHL IP FOLLOW UP RECOMMENDATIONS 02/28/2015  Follow up Recommendations Inpatient Rehab     CHL IP FREQUENCY AND DURATION 02/28/2015  Speech Therapy Frequency (ACUTE ONLY) min 2x/week  Treatment Duration 2 weeks         CHL IP REASON FOR REFERRAL 02/28/2015  Reason for Referral Objectively evaluate swallowing function     CHL IP ORAL PHASE 02/28/2015  Oral Phase Impaired  Oral - Honey Teaspoon Reduced posterior propulsion  Oral - Nectar Teaspoon Reduced posterior propulsion;Delayed oral  transit;Weak lingual manipulation  Oral - Nectar Cup Reduced posterior propulsion;Delayed oral transit;Weak  lingual manipulation  Oral - Nectar Straw Reduced posterior propulsion;Delayed oral transit;Weak   lingual manipulation  Oral - Thin Cup Reduced posterior propulsion;Delayed oral transit;Weak  lingual manipulation  Oral - Thin Straw Reduced posterior propulsion;Delayed oral transit;Weak  lingual manipulation  Oral - Thin Syringe (None)  Oral - Puree Reduced posterior propulsion;Delayed oral transit;Weak  lingual manipulation  Oral - Mechanical Soft (None)  Oral - Regular Reduced posterior propulsion;Delayed oral transit;Weak  lingual manipulation      CHL IP PHARYNGEAL PHASE 02/28/2015  Pharyngeal Phase Impaired  Pharyngeal - Honey Teaspoon Delayed swallow initiation  Pharyngeal - Nectar Teaspoon Delayed swallow initiation  Penetration/Aspiration details (nectar teaspoon) (None)  Pharyngeal - Nectar Cup Delayed swallow initiation;Premature spillage to  pyriform sinuses;Penetration/Aspiration before swallow;Significant  aspiration (Amount)  Penetration/Aspiration details (nectar cup) Material enters airway, passes  BELOW cords and not ejected out despite cough attempt by patient  Pharyngeal - Nectar Straw Delayed swallow initiation;Premature spillage to  valleculae;Premature spillage to pyriform sinuses  Pharyngeal - Thin Teaspoon Delayed swallow initiation;Premature spillage  to valleculae  Penetration/Aspiration details (thin teaspoon) (None)  Pharyngeal - Thin Cup Delayed swallow initiation;Premature spillage to  valleculae  Penetration/Aspiration details (thin cup) (None)  Pharyngeal - Thin Straw Delayed swallow initiation;Premature spillage to  valleculae;Premature spillage to pyriform sinuses  Penetration/Aspiration details (thin straw) Trace aspiration  Pharyngeal - Puree Delayed swallow initiation;Premature spillage to  valleculae  Pharyngeal - Regular Delayed swallow initiation;Premature spillage to  valleculae  Pharyngeal Comment mild amount of oral residuals prematurely spill into  pharynx without sensation and pt requiring verbal cues to swallow     CHL IP CERVICAL ESOPHAGEAL PHASE 02/28/2015  Cervical  Esophageal Phase [facility]    No flowsheet data found.         Luanna Salk, [place] [facility] SLP [phone]    Mr Jodene Nam Head/brain Wo Cm  02/28/2015   EXAM: MRI HEAD WITHOUT AND WITH CONTRAST AND MRA HEAD WITHOUT CONTRAST AND MRI NECK WITHOUT AND WITH CONTRAST  TECHNIQUE: Multiplanar, multiecho pulse sequences of the brain and surrounding structures were obtained without and with intravenous contrast. Angiographic images of the head were obtained using MRA technique without and with contrast. Multiplanar, multiecho pulse sequences of the neck and surrounding structures were obtained without and with intravenous contrast.  CONTRAST:  23mL MULTIHANCE GADOBENATE DIMEGLUMINE 529 MG/ML IV SOLN  COMPARISON:  None.  FINDINGS: MRI HEAD FINDINGS  There is persistent abnormal restricted diffusion in region of recently identified right pontine infarct. Overall, the size and distribution of this infarct is not significantly changed relative to prior study. The area of infarction measures approximately 12 x 14 x 10 mm. Associated ADC signal abnormality  has nearly normalized. Additionally, there is patchy post-contrast enhancement within this region (Series 17, image 19). Constellation of findings most compatible with a subacute infarct.  No other abnormal foci of restricted diffusion to suggest acute intracranial infarct identified. Gray-white matter differentiation otherwise maintained. Probable tiny remote lacunar infarct present within the right cerebellar hemisphere. Tiny remote lacunar infarcts also present within the thalami bilaterally. Tiny remote left pontine infarct also noted.  No mass lesion or midline shift. Very mild chronic small vessel ischemic changes noted. No acute or chronic intracranial hemorrhage. No hydrocephalus. No extra-axial fluid collection.  Craniocervical junction within normal limits. Pituitary gland unremarkable. No acute abnormality about the orbits. Sequelae of prior cataract extraction on the right. Very mild  mucoperiosteal thickening present within the left maxillary sinus. 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 No mastoid effusion. Inner ear structures normal.  Degenerative changes noted within the upper cervical spine. Bone marrow signal intensity normal. Scalp soft tissues unremarkable.  No other abnormal enhancement within the brain.  MRA HEAD FINDINGS  ANTERIOR CIRCULATION:  Visualized distal portions of the cervical segments of the internal carotid arteries are widely patent bilaterally with antegrade flow. The petrous, cavernous, and supra clinoid segment of the left ICA are widely patent, although there is minimal atheromatous irregularity within the proximal cavernous segment. There is diffuse narrowing of the distal petrous, cavernous, and supra clinoid right ICA. This is likely related to a hypoplastic right A1 segment. The anterior cerebral arteries are supplied primarily via a widely patent dominant left A1 segment. The anterior cerebral arteries themselves are widely patent and well opacified.  M1 segments patent bilaterally without focal stenosis or occlusion. MCA bifurcations within normal limits. There is distal branch atheromatous irregularity within the MCA branches bilaterally, right slightly worse than left.  POSTERIOR CIRCULATION:  Vertebral arteries are patent bilaterally to the level of the vertebrobasilar junction. There is atheromatous irregularity within the somewhat diminutive right vertebral artery with superimposed short segment severe stenosis. This is better seen on concomitant MRA of the neck (series 11607, image 5). The left vertebral artery is slightly dominant. Posterior inferior cerebellar arteries are patent proximally. There is mild multi focal atheromatous irregularity within the basilar artery without focal high-grade stenosis or occlusion. Superior cerebellar arteries patent proximally. P1 segments patent bilaterally. The left P1 segment is slightly hypoplastic  with a prominent left posterior communicating artery. Distal branch atheromatous irregularity present within the PCA branches bilaterally. The distal left P2 segment is not well visualized, and may be occluded.  No aneurysm or vascular malformation.  MRI NECK FINDINGS  Visualized portions of the aortic arch are within normal limits with normal 3 vessel morphology. No high-grade stenosis seen at the origin of the great vessels. Visualized subclavian arteries are patent bilaterally.  The right common carotid artery is well opacified to the carotid bifurcation. There is focal atheromatous plaque within the proximal right ICA with associated short segment stenosis of approximately 30%. Distally, the right ICA is well opacified.  The left common carotid artery is widely patent to the carotid bifurcation, although there is multi focal irregular plaque within the distal left common carotid artery without focal stenosis. Atheromatous irregularity and plaque present about the left carotid bifurcation, extending into the proximal left ICA. There is associated multi focal atheromatous irregularity within the proximal left ICA with superimposed stenoses of up to approximately 50%. The distal left ICA is opacified to the skullbase.  Both vertebral arteries arise from the subclavian arteries. Vertebral arteries are  fairly well opacified along their entire course to the level of the dura without focal stenosis, dissection, or occlusion. There is a superimposed high-grade stenosis within the distal right V4 segment just prior to the vertebrobasilar junction.  IMPRESSION: MRI HEAD IMPRESSION:  1. Stable size and distribution of subacute right paramedian pontine infarct as detailed above. No hemorrhagic transformation. 2. No new intracranial infarct or other abnormality identified. 3. Chronic small vessel ischemic disease with multiple remote lacunar infarcts as above.  MRA HEAD IMPRESSION:  1. No proximal branch arterial occlusion  identified within the intracranial circulation. 2. Focal high-grade stenosis within the distal right V4 segment just prior to the vertebrobasilar junction. This is best seen on concomitant neck MRA portion of this exam. 3. Diffuse irregular narrowing of the petrous, cavernous, and supra clinoid right ICA. This is likely related to the hypoplastic right A1 segment, with the anterior cerebral arteries supplied mainly via the left internal carotid artery system. 4. Multi focal atheromatous irregularity within the distal MCA and PCA branches.  MRA NECK IMPRESSION:  1. Multi focal atheromatous irregularity within the proximal left ICA with associated multi focal stenoses of up to approximately 50%. 2. More mild atheromatous disease within the proximal right ICA with associated short segment stenosis of approximately 30%. 3. Short segment high-grade stenosis within the right V4 segment as above. Vertebral arteries otherwise widely patent.   Electronically Signed   By: Jeannine Boga M.D.   On: 02/28/2015 01:49     CBC  Recent Labs Lab 02/27/15 1210 02/27/15 1219 02/28/15 0427  WBC 10.4  --  7.4  HGB 17.3* 16.7 16.1  HCT 49.0 49.0 47.2  PLT 100*  --  79*  MCV 97.0  --  98.5  MCH 34.3*  --  33.6  MCHC 35.3  --  34.1  RDW 14.3  --  14.1  LYMPHSABS 1.5  --   --   MONOABS 0.7  --   --   EOSABS 0.1  --   --   BASOSABS 0.0  --   --     Chemistries   Recent Labs Lab 02/27/15 1210 02/27/15 1219 02/28/15 0427  NA 138 139 139  K 4.0 4.1 4.2  CL 108 105 108  CO2 24  --  20  GLUCOSE 88 87 74  BUN 24* 27* 23  CREATININE 1.59* 1.50* 1.51*  CALCIUM 8.5  --  8.6  AST 15  --   --   ALT 16  --   --   ALKPHOS 75  --   --   BILITOT 1.1  --   --    ------------------------------------------------------------------------------------------------------------------ estimated creatinine clearance is 39.9 mL/min (by C-G formula based on Cr of  1.51). ------------------------------------------------------------------------------------------------------------------ No results for input(s): HGBA1C in the last 72 hours. ------------------------------------------------------------------------------------------------------------------  Recent Labs  02/28/15 0427  CHOL 162  HDL 31*  LDLCALC 102*  TRIG 145  CHOLHDL 5.2   ------------------------------------------------------------------------------------------------------------------ No results for input(s): TSH, T4TOTAL, T3FREE, THYROIDAB in the last 72 hours.  Invalid input(s): FREET3 ------------------------------------------------------------------------------------------------------------------ No results for input(s): VITAMINB12, FOLATE, FERRITIN, TIBC, IRON, RETICCTPCT in the last 72 hours.  Coagulation profile  Recent Labs Lab 02/27/15 1210  INR 1.44    No results for input(s): DDIMER in the last 72 hours.  Cardiac Enzymes No results for input(s): CKMB, TROPONINI, MYOGLOBIN in the last 168 hours.  Invalid input(s): CK ------------------------------------------------------------------------------------------------------------------ Invalid input(s): POCBNP     Time Spent in minutes  [place]  on 03/01/2015 at 9:14 AM  Between 7am to 7pm - Pager - [phone]  After 7pm go to [email or web address] - password [facility]  [facility]   Office  [phone]

## 2015-03-01 NOTE — Clinical Social Work Psychosocial (Signed)
Clinical Social Work Department BRIEF PSYCHOSOCIAL ASSESSMENT 03/01/2015  Patient:  Stanley Robles,Stanley Robles     Account Number:  [id number]     Admit date:  02/27/2015  Clinical Social Worker:  BIBBS,DYSHEKA, LCSWA  Date/Time:  03/01/2015 02:09 PM  Referred by:  RN  Date Referred:  03/01/2015 Referred for  SNF Placement   Other Referral:   Interview type:  Patient Other interview type:    PSYCHOSOCIAL DATA Living Status:  ALONE Admitted from facility:   Level of care:   Primary support name:  Stott,[name], Gearhart,Dana, Seltzer,Dawn Primary support relationship to patient:  CHILD, ADULT Degree of support available:   Strong Support    CURRENT CONCERNS Current Concerns  None Noted   Other Concerns:    SOCIAL WORK ASSESSMENT / PLAN CSW met the pt at the bedside.  CSW introduced self and purpose of the visit. CSW discussed clinical recommendation for SNF rehab. CSW inquired about the geographical location in which the pt would like to receive rehab from. Pt expressed interested in [facility]. CSW explained the SNF rehab process to the pt. CSW and pt discussed insurance and its relation to SNF rehab. CSW answered all questions in which the pt inquired about. CSW provided pt with contact information for further questions. CSW will continue to follow this pt and assist with discharge as needed.   Assessment/plan status:  Psychosocial Support/Ongoing Assessment of Needs Other assessment/ plan:   Information/referral to community resources:    PATIENT'S/FAMILY'S RESPONSE TO CURRENT DIAGNOSE: Pt presented with a normal affect and mood. Pt expressed frustration about not being about to utilize his left side of his body. Pt provided CSW with details about his daily living prior to his stroke.   PATIENT'S/FAMILY'S RESPONSE TO PLAN OF CARE: Pt rpeorted that he will discussed [facility] with his daughters.    Dysheka Bibbs, MSW, LCSWA [phone] 

## 2015-03-01 NOTE — Evaluation (Signed)
Occupational Therapy Evaluation Patient Details Name: Stanley Robles MRN: [id number] DOB: Sep 08, 1932 Today's Date: 03/01/2015    History of Present Illness Pt is an 81 yo male who was brought to ED for disathria and L sided weakness. 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   Clinical Impression   Pt admitted with the above diagnoses and presents with below problem list. Pt will benefit from continued OT to address the below listed deficits and maximize independence with BADLs prior to d/c to venue below. PTA pt was independent with ADLs. Pt currently at mod A level for most ADLs and +2 for functional mobility per PT note. Pt completed transfers, ADLs, and LUE exercises as detailed below. Recommend CIR at d/c.     Follow Up Recommendations  CIR; 24 hour supervision/assistance    Equipment Recommendations  Other (comment) (TBD)    Recommendations for Other Services       Precautions / Restrictions Precautions Precautions: Fall Restrictions Weight Bearing Restrictions: No      Mobility Bed Mobility Overal bed mobility: Needs Assistance Bed Mobility: Supine to Sit;Sit to Supine     Supine to sit: Min assist;HOB elevated Sit to supine: Min assist   General bed mobility comments: min A for supine>sit to advance trunk due to Lt side weakness. pt used bed rails, HOB elevated.  Transfers Overall transfer level: Needs assistance Equipment used: Rolling walker (2 wheeled) Transfers: Sit to/from Stand Sit to Stand: Mod assist         General transfer comment: verbal cues and physical assist to correct posterior lean, mod A initially in standing transitioning to min A. Stood for 2-3 minutes.    Balance Overall balance assessment: Needs assistance Sitting-balance support: Feet supported;Bilateral upper extremity supported Sitting balance-Leahy Scale: Fair   Postural control: Posterior lean Standing balance support: Bilateral upper extremity supported;During functional  activity Standing balance-Leahy Scale: Poor                              ADL Overall ADL's : Needs assistance/impaired Eating/Feeding: Set up;Sitting Eating/Feeding Details (indicate cue type and reason): using dominant Rt hand Grooming: Set up;Sitting;Wash/dry face Grooming Details (indicate cue type and reason): using dominant Rt hand Upper Body Bathing: Minimal assitance;With adaptive equipment;Sitting   Lower Body Bathing: Moderate assistance;Sit to/from stand   Upper Body Dressing : Minimal assistance;Sitting   Lower Body Dressing: Moderate assistance;Sit to/from stand;With adaptive equipment   Toilet Transfer: Moderate assistance;+2 for safety/equipment;Stand-pivot;BSC   Toileting- Clothing Manipulation and Hygiene: Moderate assistance;Sit to/from stand   Tub/ Banker: Moderate assistance;Stand-pivot;3 in 1   Functional mobility during ADLs: Moderate assistance;+2 for physical assistance;Maximal assistance (per PT note 02/28/15) General ADL Comments: Pt completed bed mobility, sit<>stand from EOB and took sidesteps by bed. Mod A for LB ADLs and transfers due to balance. Educated pt on ROM exercises for LUE, with pt completing as detailed below.     Vision Vision Assessment?: No apparent visual deficits   Perception     Praxis      Pertinent Vitals/Pain Pain Assessment: No/denies pain     Hand Dominance Right   Extremity/Trunk Assessment Upper Extremity Assessment Upper Extremity Assessment: LUE deficits/detail LUE Deficits / Details: able to use as gross assist; isolated movements noted; grossly 3/5 distal and 2+/5 proximal strength. assessed EOB, against gravity. LUE Coordination: decreased fine motor;decreased gross motor   Lower Extremity Assessment Lower Extremity Assessment: Defer to PT evaluation  Communication Communication Communication: HOH;Other (comment) (left ear)   Cognition Arousal/Alertness: Awake/alert Behavior  During Therapy: WFL for tasks assessed/performed Overall Cognitive Status: Within Functional Limits for tasks assessed                     General Comments       Exercises Exercises: General Upper Extremity     Shoulder Instructions      Home Living Family/patient expects to be discharged to:: Inpatient rehab Living Arrangements: Alone                               Additional Comments: daughter [name] present and reports her and her sisters can figure out how to provide 24/7 assist for patient  Lives With: Alone    Prior Functioning/Environment Level of Independence: Independent        Comments: worked on cars in his shop, did grocery shopping    OT Diagnosis: Hemiplegia non-dominant side   OT Problem List: Decreased strength;Decreased range of motion;Impaired balance (sitting and/or standing);Decreased activity tolerance;Decreased coordination;Decreased knowledge of use of DME or AE;Decreased knowledge of precautions;Impaired tone;Impaired UE functional use   OT Treatment/Interventions: Self-care/ADL training;Therapeutic exercise;Neuromuscular education;DME and/or AE instruction;Therapeutic activities;Patient/family education;Balance training    OT Goals(Current goals can be found in the care plan section) Acute Rehab OT Goals 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 OT Goal Formulation: With patient Time For Goal Achievement: 03/15/15 Potential to Achieve Goals: Good ADL Goals Pt Will Perform Upper Body Bathing: with min assist;sitting Pt Will Perform Lower Body Bathing: with min assist;sit to/from stand;with adaptive equipment Pt Will Perform Upper Body Dressing: with min assist;with adaptive equipment;sitting Pt Will Perform Lower Body Dressing: with min assist;with adaptive equipment;sit to/from stand Pt Will Transfer to Toilet: with min assist;ambulating;bedside commode Pt Will Perform Toileting - Clothing Manipulation and hygiene: with min  assist;sit to/from stand;with adaptive equipment Pt/caregiver will Perform Home Exercise Program: Increased ROM;Increased strength;Left upper extremity;With written HEP provided Additional ADL Goal #1: Pt will complete bed mobility at min guard level to prepare for OOB ADLs.  OT Frequency: Min 3X/week   Barriers to D/C:            Co-evaluation              End of Session Equipment Utilized During Treatment: Gait belt;Rolling walker  Activity Tolerance: Patient tolerated treatment well Patient left: in bed;with call bell/phone within reach;with SCD's reapplied;with bed alarm set   Time: 1021-1050 OT Time Calculation (min): 29 min Charges:  OT General Charges $OT Visit: 1 Procedure OT Evaluation $Initial OT Evaluation Tier I: 1 Procedure OT Treatments $Therapeutic Activity: 8-22 mins G-Codes:    Hortencia Pilar 03/19/15, 12:49 PM

## 2015-03-01 NOTE — Progress Notes (Signed)
Speech Language Pathology Treatment: Dysphagia  Patient Details Name: Stanley Robles MRN: [id number] DOB: November 20, 1932 Today's Date: 03/01/2015 Time: 6659-9357 SLP Time Calculation (min) (ACUTE ONLY): 35 min  Assessment / Plan / Recommendation Clinical Impression  Pt awake upon SLP entrance to room, SLP set him up to allow him to brush his teeth.  Provided pt with thin water via tsp - using chin tuck. Swallow followed by immediate overt coughing - suspect aspiration due to inadequate chin tuck.  Pt tolerated tsp thin water with adequate chin tuck position.   Nectar thick liquids provided with straw and pt in position first allow maximal chin tuck.  No s/s of aspiration with nectar via cup/straw, puree via tsp and moist cracker.  Pt does require MAX cues to conduct adequate chin tuck although he can verbalize with modified independence.  He will require TOTAL assist to implement these strategies/precautions at this time.  SLP reviewed with pt clinical reasoning for chin tuck and "fast hard swallow"- and had pt return demonstration to use of oral suction.  Pt remains afebrile with lungs decreased and intake listed as 100%- recommend continue diet.  SLP focused on swallowing only today due to its importance.    No family present at this time.  Spoke to RN re: pt dysphagia and importance of absolute full supervision.  Meal tray arrived at end of session and SLP called secretary to request assist for pt with breakfast.    HPI HPI: 81 yo male adm with left sided weakness, worsening coughing with po/dysphagia recently diagnosed with right pontine CVA.  PMH + for MI, knee replacement.  Pt on blood thinner.  Swallow evaluation and speech evaluation ordered.     Pertinent Vitals Pain Assessment: No/denies pain  SLP Plan  Continue with current plan of care    Recommendations Diet recommendations: Dysphagia 3 (mechanical soft);Nectar-thick liquid (tsps thin water between meals) Liquids provided via:  Teaspoon Medication Administration: Crushed with puree Supervision: Full supervision/cueing for compensatory strategies Compensations: Multiple dry swallows after each bite/sip;Slow rate;Small sips/bites;Check for pocketing Postural Changes and/or Swallow Maneuvers: Chin tuck              General recommendations: Rehab consult Oral Care Recommendations: Oral care Q4 per protocol;Oral care before and after PO Follow up Recommendations: Inpatient Rehab Plan: Continue with current plan of care    [place], [place] [facility] SLP [phone]

## 2015-03-01 NOTE — Progress Notes (Signed)
STROKE TEAM PROGRESS NOTE   HISTORY Stanley Robles is an 81 y.o. male who last Wed family noted his speech was dysarthric. They returned home and went to his PCP. The daughter at bedside was not at that visit but states his INR was supra-therapeutic and he was told to hold his Coumadin and restart on Sunday. The daughter states over the past 3 days she feels his left sided weakness has worsened. Patient himself does not feels anything has changed. MRI obtained in [place] shows a right pontine infarct which again is visualized on CT here in ED. He was last known well 02/21/2015, unable to determine time. Patient was not administered TPA secondary to previous stroke 4 days prior. Marland Kitchen He was admitted for further evaluation and treatment.   SUBJECTIVE (INTERVAL HISTORY) His daughter is at the bedside.  Overall he feels his condition is stable. Per daughter, she feels this occurred last 47. Went to the doctor on [place] ([place] who did an OP MRI. Continued to worsen from dysarthria to hemiparesis and ataxia.   OBJECTIVE Temp:  [97.4 F (36.3 C)-98.2 F (36.8 C)] 97.7 F (36.5 C) (04/07 0937) Pulse Rate:  [50-61] 52 (04/07 0937) Cardiac Rhythm:  [-] Sinus bradycardia (04/07 0800) Resp:  [16-20] 20 (04/07 0937) BP: (142-179)/(47-70) 151/57 mmHg (04/07 0937) SpO2:  [95 %-100 %] 100 % (04/07 0937)   Recent Labs Lab 02/27/15 1206  GLUCAP 75    Recent Labs Lab 02/27/15 1210 02/27/15 1219 02/28/15 0427  NA 138 139 139  K 4.0 4.1 4.2  CL 108 105 108  CO2 24  --  20  GLUCOSE 88 87 74  BUN 24* 27* 23  CREATININE 1.59* 1.50* 1.51*  CALCIUM 8.5  --  8.6    Recent Labs Lab 02/27/15 1210  AST 15  ALT 16  ALKPHOS 75  BILITOT 1.1  PROT 5.8*  ALBUMIN 3.0*    Recent Labs Lab 02/27/15 1210 02/27/15 1219 02/28/15 0427  WBC 10.4  --  7.4  NEUTROABS 8.2*  --   --   HGB 17.3* 16.7 16.1  HCT 49.0 49.0 47.2  MCV 97.0  --  98.5  PLT 100*  --  79*   No results for input(s): CKTOTAL,  CKMB, CKMBINDEX, TROPONINI in the last 168 hours.  Recent Labs  02/27/15 1210  LABPROT 17.6*  INR 1.44   No results for input(s): COLORURINE, LABSPEC, PHURINE, GLUCOSEU, HGBUR, BILIRUBINUR, KETONESUR, PROTEINUR, UROBILINOGEN, NITRITE, LEUKOCYTESUR in the last 72 hours.  Invalid input(s): APPERANCEUR     Component Value Date/Time   CHOL 162 02/28/2015 0427   TRIG 145 02/28/2015 0427   HDL 31* 02/28/2015 0427   CHOLHDL 5.2 02/28/2015 0427   VLDL 29 02/28/2015 0427   LDLCALC 102* 02/28/2015 0427   Lab Results  Component Value Date   HGBA1C 5.8* 02/28/2015   No results found for: LABOPIA, COCAINSCRNUR, LABBENZ, AMPHETMU, THCU, LABBARB  No results for input(s): ETH in the last 168 hours.  Dg Chest 2 View 02/27/2015    No active cardiopulmonary disease.     Ct Head (brain) Wo Contrast 02/27/2015    Stable recent infarct medial right pons. Stable atrophy with mild periventricular small vessel disease. No new infarct identified compared to recent MR study. No hemorrhage, mass effect, or extra-axial fluid collection. Status post bilateral mastoidectomies.     MRI HEAD  02/28/2015    1. Stable size and distribution of subacute right paramedian pontine infarct as detailed above. No hemorrhagic transformation. 2.  No new intracranial infarct or other abnormality identified. 3. Chronic small vessel ischemic disease with multiple remote lacunar infarcts as above.    MRA HEAD  02/28/2015    1. No proximal branch arterial occlusion identified within the intracranial circulation. 2. Focal high-grade stenosis within the distal right V4 segment just prior to the vertebrobasilar junction. This is best seen on concomitant neck MRA portion of this exam. 3. Diffuse irregular narrowing of the petrous, cavernous, and supra clinoid right ICA. This is likely related to the hypoplastic right A1 segment, with the anterior cerebral arteries supplied mainly via the left internal carotid artery system. 4. Multi focal  atheromatous irregularity within the distal MCA and PCA branches.    MRA NECK  02/28/2015    1. Multi focal atheromatous irregularity within the proximal left ICA with associated multi focal stenoses of up to approximately 50%. 2. More mild atheromatous disease within the proximal right ICA with associated short segment stenosis of approximately 30%. 3. Short segment high-grade stenosis within the right V4 segment as above. Vertebral arteries otherwise widely patent.      PHYSICAL EXAM Pleasant elderly Caucasian male currently not in distress. . Afebrile. Head is nontraumatic. Neck is supple without bruit.    Cardiac exam no murmur or gallop. Lungs are clear to auscultation. Distal pulses are well felt. Neurological Exam : Awake alert oriented 3. Mild dysarthria. No aphasia. Fundi were not visualized. Vision acuity seems adequate. Extraocular movements are full range without nystagmus. Left lower facial weakness. Tongue midline. Motor system exam mild left upper and lower extremity drift and weakness of intrinsic hand muscles and left grip. 4/5 strength proximally but greater weakness distally in the left hand and left leg. Reflexes symmetric. Plantars downgoing. Coordination slow and slightly impaired on the left. Sensation intact. Gait was not tested. ASSESSMENT/PLAN [name] is a 81 y.o. male with history of HTN, CAD with acute MI 3 in the past and dyslipidemia presenting initially as an OP with MRI positive for pontine stroke. Worsening hemiparesis, ataxia and dysphagia led to a non-physician recommending his go to the hospital 6 days later. He did not receive IV t-PA at [facility] due to delay in arrival.   Stroke:  Dominant right Pontine infarct that is felt to be  secondary to R vertebral artery stenosis/occlusion. The stroke is subacute and occurred 02/21/2015  Resultant  Dysarthria, dysphagia, left hemiparesis, ataxia  MRI  R paramedian pontine infarct  MRA head  R VA high grade  stenosis  MRA neck L ICA  2D Echo Left ventricle: The cavity size was normal. There was mild concentric hypertrophy. Systolic function was vigorous. The estimated ejection fraction was in the range of 65% to 70%. Wall motion was normal; there were no regional wall motion abnormalities.  HgbA1c  5.8%  eliquis for VTE prophylaxis DIET DYS 3 Room service appropriate?: Yes with Assist; Fluid consistency:: Nectar Thick  warfarin prior to admission, changed to   eliquis (apixaban)  Patient and daughter counseled to be compliant with his antithrombotic medications  Ongoing aggressive stroke risk factor management  Therapy recommendations:  CIR  Disposition:  CIR consulted.  Thrombocytopenia  PLT 79  Aspirin stopped  Atrial Fibrillation  Home meds:  Coumadin PTA  INR was elevated on Thurs and coumadin was held.   INR was subtherapeutic at 1.44 on admission  He had his stroke on suprotherapeutic coumadin, therefore, definitely agree to change to Eliquis. Started on 2.5 mg bid and aspirin was stopped.  Continue eliquis at discharge  Hypertension  Home meds:   lisinopril  Stable, slightly elevated  Ok to resume home medications  Hyperlipidemia  Home meds:  crestor 20 qod  not yest resumed in hospital as pt NPO  LDL 102, goal < 70  Crestor/substitution started  Continue statin at discharge  Other Stroke Risk Factors  Advanced age  Former Cigarette smoker, quit smoking prior to 1980  Hx Stroke/TIA  Coronary artery disease - MI x 3  PVD  Other Active Problems  CKD stage 3, baseline Cr 1.5-1.8  BPH  GERD  Hospital day # [place] for Pager information 03/01/2015 11:27 AM  I have personally examined this patient, reviewed notes, independently viewed imaging studies, participated in medical decision making and plan of care. I have made any additions or clarifications directly to the above  note. Agree with note above. This patient has a subacute) pontine infarct etiology likely from symptomatic distal right vertebral artery stenosis as well as known atrial fibrillation.  Patient needs to be an anticoagulation given history of atrial fibrillation anticipate discharge to inpatient rehabilitation for the next few days. Stroke team will sign off. Follow-up as an outpatient in stroke clinic in 2 months. Kindly call for questions. Antony Contras, MD Medical Director [facility] Pager: [phone] 03/01/2015 11:27 AM    To contact Stroke Continuity provider, please refer to [email or web address] After hours, contact General Neurology

## 2015-03-01 NOTE — Discharge Instructions (Addendum)
Follow with Primary MD Stanley Robles,Stanley B., MD in 7 days   Get CBC, CMP, 2 view Chest X ray checked  by Primary MD next visit.    Activity: As tolerated with Full fall precautions use walker/cane & assistance as needed   Disposition SNF   Diet:  Dysphagia 3 with nectar thick liquids with feeding assistance and aspiration precautions as needed.  For Heart failure patients - Check your Weight same time everyday, if you gain over 2 pounds, or you develop in leg swelling, experience more shortness of breath or chest pain, call your Primary MD immediately. Follow Cardiac Low Salt Diet and 1.5 lit/day fluid restriction.   On your next visit with your primary care physician please Get Medicines reviewed and adjusted.   Please request your Prim.MD to go over all Hospital Tests and Procedure/Radiological results at the follow up, please get all Hospital records sent to your Prim MD by signing hospital release before you go home.   If you experience worsening of your admission symptoms, develop shortness of breath, life threatening emergency, suicidal or homicidal thoughts you must seek medical attention immediately by calling 911 or calling your MD immediately  if symptoms less severe.  You Must read complete instructions/literature along with all the possible adverse reactions/side effects for all the Medicines you take and that have been prescribed to you. Take any new Medicines after you have completely understood and accpet all the possible adverse reactions/side effects.   Do not drive, operating heavy machinery, perform activities at heights, swimming or participation in water activities or provide baby sitting services if your were admitted for syncope or siezures until you have seen by Primary MD or a Neurologist and advised to do so again.  Do not drive when taking Pain medications.    Do not take more than prescribed Pain, Sleep and Anxiety Medications  Special Instructions: If you have  smoked or chewed Tobacco  in the last 2 yrs please stop smoking, stop any regular Alcohol  and or any Recreational drug use.  Wear Seat belts while driving.   Please note  You were cared for by a hospitalist during your hospital stay. If you have any questions about your discharge medications or the care you received while you were in the hospital after you are discharged, you can call the unit and asked to speak with the hospitalist on call if the hospitalist that took care of you is not available. Once you are discharged, your primary care physician will handle any further medical issues. Please note that NO REFILLS for any discharge medications will be authorized once you are discharged, as it is imperative that you return to your primary care physician (or establish a relationship with a primary care physician if you do not have one) for your aftercare needs so that they can reassess your need for medications and monitor your lab values.  Information on my medicine - ELIQUIS (apixaban)  This medication education was reviewed with me or my healthcare representative as part of my discharge preparation.  The pharmacist that spoke with me during my hospital stay was:  Lawson Radar, [facility]  Why was Eliquis prescribed for you? Eliquis was prescribed for you to reduce the risk of a blood clot forming that can cause a stroke if you have a medical condition called atrial fibrillation (a type of irregular heartbeat).  What do You need to know about Eliquis ? Take your Eliquis TWICE DAILY - one tablet in the morning  and one tablet in the evening with or without food. If you have difficulty swallowing the tablet whole please discuss with your pharmacist how to take the medication safely.  Take Eliquis exactly as prescribed by your doctor and DO NOT stop taking Eliquis without talking to the doctor who prescribed the medication.  Stopping may increase your risk of developing a stroke.  Refill  your prescription before you run out.  After discharge, you should have regular check-up appointments with your healthcare provider that is prescribing your Eliquis.  In the future your dose may need to be changed if your kidney function or weight changes by a significant amount or as you get older.  What do you do if you miss a dose? If you miss a dose, take it as soon as you remember on the same day and resume taking twice daily.  Do not take more than one dose of ELIQUIS at the same time to make up a missed dose.  Important Safety Information A possible side effect of Eliquis is bleeding. You should call your healthcare provider right away if you experience any of the following: ? Bleeding from an injury or your nose that does not stop. ? Unusual colored urine (red or dark brown) or unusual colored stools (red or black). ? Unusual bruising for unknown reasons. ? A serious fall or if you hit your head (even if there is no bleeding).  Some medicines may interact with Eliquis and might increase your risk of bleeding or clotting while on Eliquis. To help avoid this, consult your healthcare provider or pharmacist prior to using any new prescription or non-prescription medications, including herbals, vitamins, non-steroidal anti-inflammatory drugs (NSAIDs) and supplements.  This website has more information on Eliquis (apixaban): http://www.eliquis.com/eliquis/home

## 2015-03-01 NOTE — Clinical Social Work Placement (Addendum)
Clinical Social Work Department CLINICAL SOCIAL WORK PLACEMENT NOTE 03/01/2015  Patient:  Stanley Robles, Stanley Robles  Account Number:  [id number] [place] date:  02/27/2015  Clinical Social Worker:  Greta Doom, LCSWA  Date/time:  03/01/2015 02:16 PM  Clinical Social Work is seeking post-discharge placement for this patient at the following level of care:   SKILLED NURSING   (*CSW will update this form in Epic as items are completed)   03/01/2015  Patient/family provided with [place] Department of Clinical Social Work's list of facilities offering this level of care within the geographic area requested by the patient (or if unable, by the patient's family).  03/01/2015  Patient/family informed of their freedom to choose among providers that offer the needed level of care, that participate in Medicare, Medicaid or managed care program needed by the patient, have an available bed and are willing to accept the patient.  03/01/2015  Patient/family informed of [facility]' ownership interest in [facility], as well as of the fact that they are under no obligation to receive care at this facility.  PASARR submitted to EDS on  PASARR number received on   FL2 transmitted to all facilities in geographic area requested by pt/family on  03/01/2015 FL2 transmitted to all facilities within larger geographic area on 03/01/2015  Patient informed that his/her managed care company has contracts with or will negotiate with  certain facilities, including the following:     Patient/family informed of bed offers received:  03/01/2015 Patient chooses bed at [facility] Physician recommends and patient chooses bed at    Patient to be transferred to [facility] SNF on  03/02/2015 Patient to be transferred to facility by [facility] Patient and family notified of transfer on 03/02/2015  Name of family member notified: Harrell Lark   The following physician request were entered in  Epic:   Additional Comments:  [place], MSW, [place]

## 2015-03-02 ENCOUNTER — Inpatient Hospital Stay
Admission: RE | Admit: 2015-03-02 | Discharge: 2015-04-07 | Disposition: A | Discharge status: Home / Self Care | Payer: Medicare Other | Source: Ambulatory Visit | Attending: Internal Medicine | Admitting: Internal Medicine

## 2015-03-02 DIAGNOSIS — R1310 Dysphagia, unspecified: Principal | ICD-10-CM

## 2015-03-02 LAB — CBC
HCT: 44.1 % (ref 39.0–52.0)
Hemoglobin: 15.6 g/dL (ref 13.0–17.0)
MCH: 34.1 pg — ABNORMAL HIGH (ref 26.0–34.0)
MCHC: 35.4 g/dL (ref 30.0–36.0)
MCV: 96.3 fL (ref 78.0–100.0)
Platelets: 79 10*3/uL — ABNORMAL LOW (ref 150–400)
RBC: 4.58 MIL/uL (ref 4.22–5.81)
RDW: 14.1 % (ref 11.5–15.5)
WBC: 8 10*3/uL (ref 4.0–10.5)

## 2015-03-02 MED ORDER — APIXABAN 2.5 MG PO TABS: 2.5000 mg | ORAL_TABLET | Freq: Two times a day (BID) | ORAL | Status: DC

## 2015-03-02 MED ORDER — RESOURCE THICKENUP CLEAR PO POWD: ORAL | Status: DC

## 2015-03-02 NOTE — Discharge Summary (Signed)
Stanley Robles, is a 81 y.o. male  DOB May 21, 1932  MRN [id number].  Admission date:  02/27/2015  Admitting Physician  Verlee Monte, MD  Discharge Date:  03/02/2015   Primary MD  Glenda Chroman., MD  Recommendations for primary care physician for things to follow:   Monitor secondary to his factors for stroke.   Admission Diagnosis  Dysphasia due to recent stroke [I69.921] Cerebral infarction due to unspecified mechanism [I63.9]   Discharge Diagnosis  Dysphasia due to recent stroke [I69.921] Cerebral infarction due to unspecified mechanism [I63.9]    Principal Problem:   Right pontine stroke Active Problems:   Paroxysmal atrial fibrillation   Long-term (current) use of anticoagulants   Chronic kidney disease stage 3   CVA (cerebral infarction)   Left-sided weakness   Dysphagia due to recent cerebral infarction      Past Medical History  Diagnosis Date  . Hyperlipidemia   . Peripheral vascular disease   . Coronary artery disease     DR. TILLEY IS PT'S CARDIOLGIST  . Myocardial infarction 1994  . Gout   . BPH (benign prostatic hypertrophy)   . GERD (gastroesophageal reflux disease)   . 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 Tooth disease   . Personal history of transient ischemic attack (TIA) and cerebral infarction without residual deficit   . Chronic kidney disease stage 3   . Hypertensive heart disease   . Paroxysmal atrial fibrillation   . Osteoarthritis   . Right pontine stroke 02/27/2015    Past Surgical History  Procedure Laterality Date  . Mastoid surgery x 5    . Prostate surgery    . Hernia repair  12/26/2002    BILATERAL INGUINAL HERNIA REPAIR  . Cardiac catheterization  07/21/2005  . Total knee arthroplasty Left 03/28/2013    Procedure: LEFT TOTAL KNEE  ARTHROPLASTY;  Surgeon: Gearlean Alf, MD;  Location: [facility];  Service: Orthopedics;  Laterality: Left;       History of present illness and  Hospital Course:     Kindly see H&P for history of present illness and admission details, please review complete Labs, Consult reports and Test reports for all details in brief  HPI  from the history and physical done on the day of admission  Stanley Robles is a 81 y.o. male with past medical history of HTN, CAD with acute MI 3 in the past and dyslipidemia came into the hospital complaining about worsening of recent left-sided weakness. History was taken from patient and has 2 daughters at bedside. Patient started to have symptoms last Wednesday with a slurred speech and left-sided weakness, if seen his PCP on Thursday and MRI was done and showed a right pontine stroke, because of minimal neurological deficits. Decision was made to do the workup as outpatient. Apparently in the interim patient was getting worse so his daughter contacted Dr. Woody Seller and he recommended them to go to the hospital. Per patient is having more troubles with  the left upper lower extremities, more problems with swallowing as he developed a cough and choking while eating and drinking. In the ED previous MRI from [facility] done on Thursday the 31st showed right pontine stroke, CT scan of the head was repeated and showed recent infarct but no new infarct compared to the MRI. Patient denies any other complaints. Admitted to the hospital for further neurological workup.  Hospital Course    1. L-sided weakness with MRI evidence of subacute right paramedian infarct - likely occurred 3 days prior to admission, monitor on tele, obtain PT, OT, speech input, table A1c and MRA neck, neurology following. Switched to [employer]. Statin dose adjusted to daily from every other day. Speech therapy following. Currently on dysphagia 3 diet with nectar thick liquids. Stable for discharge per  neurology. Will go to a SNF will require continued speech therapy, PT and OT.   Lab Results  Component Value Date   HGBA1C 5.8* 02/28/2015            Recent Labs    Lab Results  Component Value Date   CHOL 162 02/28/2015   HDL 31* 02/28/2015   LDLCALC 102* 02/28/2015   TRIG 145 02/28/2015   CHOLHDL 5.2 02/28/2015       2. Dyslipidemia. Placed on statin with increased dose. Was taking every other day statin at home changed to daily.   3.CKD 3 - at baseline.   4.HTN - we'll resume home meds upon discharge.   5. Paroxysmal atrial fibrillation. Currently in sinus, Coumadin switched to Eliquis.   6. GERD. Placed on PPI.   7. Gout. Resumed home medications.   8. BPH. Resumed alpha blocker at home dose        Discharge Condition: Stable   Follow UP  Follow-up Information    Follow up with VYAS,DHRUV B., MD. Schedule an appointment as soon as possible for a visit in 1 week.   Specialty:  Internal Medicine   Contact information:   [place] [address] [phone]       Follow up with SETHI,PRAMOD, MD. Schedule an appointment as soon as possible for a visit in 1 month.   Specialties:  Neurology, Radiology   Why:  [facility]   Contact information:   [address] [place] [address] [phone]         Discharge Instructions  and  Discharge Medications      Discharge Instructions    Discharge instructions    Complete by:  As directed   Follow with Primary MD VYAS,DHRUV B., MD in 7 days   Get CBC, CMP, 2 view Chest X ray checked  by Primary MD next visit.    Activity: As tolerated with Full fall precautions use walker/cane & assistance as needed   Disposition SNF   Diet:  Dysphagia 3 with nectar thick liquids with feeding assistance and aspiration precautions as needed.  For Heart failure patients - Check your Weight same time everyday, if you gain over 2 pounds, or you develop in leg swelling,  experience more shortness of breath or chest pain, call your Primary MD immediately. Follow Cardiac Low Salt Diet and 1.5 lit/day fluid restriction.   On your next visit with your primary care physician please Get Medicines reviewed and adjusted.   Please request your Prim.MD to go over all Hospital Tests and Procedure/Radiological results at the follow up, please get all Hospital records sent to your Prim MD by signing hospital release  before you go home.   If you experience worsening of your admission symptoms, develop shortness of breath, life threatening emergency, suicidal or homicidal thoughts you must seek medical attention immediately by calling 911 or calling your MD immediately  if symptoms less severe.  You Must read complete instructions/literature along with all the possible adverse reactions/side effects for all the Medicines you take and that have been prescribed to you. Take any new Medicines after you have completely understood and accpet all the possible adverse reactions/side effects.   Do not drive, operating heavy machinery, perform activities at heights, swimming or participation in water activities or provide baby sitting services if your were admitted for syncope or siezures until you have seen by Primary MD or a Neurologist and advised to do so again.  Do not drive when taking Pain medications.    Do not take more than prescribed Pain, Sleep and Anxiety Medications  Special Instructions: If you have smoked or chewed Tobacco  in the last 2 yrs please stop smoking, stop any regular Alcohol  and or any Recreational drug use.  Wear Seat belts while driving.   Please note  You were cared for by a hospitalist during your hospital stay. If you have any questions about your discharge medications or the care you received while you were in the hospital after you are discharged, you can call the unit and asked to speak with the hospitalist on call if the hospitalist that took  care of you is not available. Once you are discharged, your primary care physician will handle any further medical issues. Please note that NO REFILLS for any discharge medications will be authorized once you are discharged, as it is imperative that you return to your primary care physician (or establish a relationship with a primary care physician if you do not have one) for your aftercare needs so that they can reassess your need for medications and monitor your lab values.     Increase activity slowly    Complete by:  As directed             Medication List    STOP taking these medications        warfarin 4 MG tablet  Commonly known as:  COUMADIN      TAKE these medications        acetaminophen 500 MG tablet  Commonly known as:  TYLENOL  Take 1,000 mg by mouth every 6 (six) hours as needed for pain.     allopurinol 300 MG tablet  Commonly known as:  ZYLOPRIM  Take 300 mg by mouth daily.     apixaban 2.5 MG Tabs tablet  Commonly known as:  ELIQUIS  Take 1 tablet (2.5 mg total) by mouth 2 (two) times daily.     atenolol 25 MG tablet  Commonly known as:  TENORMIN  Take 1 tablet (25 mg total) by mouth 2 (two) times daily.     colchicine 0.6 MG tablet  Take 0.6 mg by mouth daily.     rosuvastatin 20 MG tablet  Commonly known as:  CRESTOR  Take 20 mg by mouth every other day.     CRESTOR 40 MG tablet  Generic drug:  rosuvastatin  Take 40 mg by mouth daily.     docusate sodium 100 MG capsule  Commonly known as:  COLACE  Take 100 mg by mouth daily.     DSS 100 MG Caps  Take 100 mg by mouth 2 (two) times daily.  DUREZOL 0.05 % Emul  Generic drug:  Difluprednate     finasteride 5 MG tablet  Commonly known as:  PROSCAR  Take 5 mg by mouth daily.     gentamicin 0.3 % ophthalmic solution  Commonly known as:  GARAMYCIN     ILEVRO 0.3 % ophthalmic suspension  Generic drug:  nepafenac     lisinopril 10 MG tablet  Commonly known as:  PRINIVIL,ZESTRIL  Take 10  mg by mouth daily.     nitroGLYCERIN 0.4 MG SL tablet  Commonly known as:  NITROSTAT  Place 0.4 mg under the tongue every 5 (five) minutes as needed for chest pain.     omeprazole 20 MG capsule  Commonly known as:  PRILOSEC  Take 20 mg by mouth daily.     polyvinyl alcohol 1.4 % ophthalmic solution  Commonly known as:  LIQUIFILM TEARS  Place 2 drops into both eyes as needed.     RESOURCE THICKENUP CLEAR Powd  Used to thicken all liquids          Diet and Activity recommendation: See Discharge Instructions above   Consults obtained - Neuro   Major procedures and Radiology Reports - PLEASE review detailed and final reports for all details, in brief -    TTE  - Left ventricle: The cavity size was normal. There was mildconcentric hypertrophy. Systolic function was vigorous. Theestimated ejection fraction was in the range of 65% to 70%. Wallmotion was normal; there were no regional wall motionabnormalities. - Aortic valve: There was mild regurgitation. - Left atrium: The atrium was mildly dilated. - Right atrium: The atrium was mildly dilated. - Pulmonary arteries: Systolic pressure was mildly increased. PApeak pressure: 35 mm Hg (S).  Impressions: - No cardiac source of emboli was indentified.        Dg Chest 2 View  02/27/2015   CLINICAL DATA:  Cough and left-sided weakness.  EXAM: CHEST  2 VIEW  COMPARISON:  08/29/2014  FINDINGS: Shallow inspiration. Heart size and pulmonary vascularity are borderline but likely normal for technique. No focal airspace disease or consolidation in the lungs. No blunting of costophrenic angles. No pneumothorax. Calcification of the aorta. Degenerative changes in the spine.  IMPRESSION: No active cardiopulmonary disease.   Electronically Signed   By: Lucienne Capers M.D.   On: 02/27/2015 23:34   Ct Head (brain) Wo Contrast  02/27/2015   CLINICAL DATA:  Increase left-sided weakness and facial droop. Patient with documented pontine  infarct on the right on recent Stanley.  EXAM: CT HEAD WITHOUT CONTRAST  TECHNIQUE: Contiguous axial images were obtained from the base of the skull through the vertex without intravenous contrast.  COMPARISON:  Brain MRI February 22, 2015  FINDINGS: Mild diffuse atrophy is stable. There is no intracranial mass, hemorrhage, extra-axial fluid collection, or midline shift. The recent infarct in the medial right pons is seen on CT than does not appear significantly changed compared to recent Stanley study. There is mild small vessel disease in the centra semiovale bilaterally. No new gray-white compartment lesion is identified. No acute infarct in addition to the acute infarct in the right pons noted on recent Stanley is seen. The bony calvarium appears intact. The patient has had mastoidectomies bilaterally.  IMPRESSION: Stable recent infarct medial right pons. Stable atrophy with mild periventricular small vessel disease. No new infarct identified compared to recent Stanley study. No hemorrhage, mass effect, or extra-axial fluid collection. Status post bilateral mastoidectomies.   Electronically Signed   By: Gwyndolyn Saxon  Jasmine December III M.D.   On: 02/27/2015 14:24   Stanley Angiogram Neck W Wo Contrast  02/28/2015   EXAM: MRI HEAD WITHOUT AND WITH CONTRAST AND MRA HEAD WITHOUT CONTRAST AND MRI NECK WITHOUT AND WITH CONTRAST  TECHNIQUE: Multiplanar, multiecho pulse sequences of the brain and surrounding structures were obtained without and with intravenous contrast. Angiographic images of the head were obtained using MRA technique without and with contrast. Multiplanar, multiecho pulse sequences of the neck and surrounding structures were obtained without and with intravenous contrast.  CONTRAST:  2mL MULTIHANCE GADOBENATE DIMEGLUMINE 529 MG/ML IV SOLN  COMPARISON:  None.  FINDINGS: MRI HEAD FINDINGS  There is persistent abnormal restricted diffusion in region of recently identified right pontine infarct. Overall, the size and distribution of this  infarct is not significantly changed relative to prior study. The area of infarction measures approximately 12 x 14 x 10 mm. Associated ADC signal abnormality has nearly normalized. Additionally, there is patchy post-contrast enhancement within this region (Series 17, image 19). Constellation of findings most compatible with a subacute infarct.  No other abnormal foci of restricted diffusion to suggest acute intracranial infarct identified. Gray-white matter differentiation otherwise maintained. Probable tiny remote lacunar infarct present within the right cerebellar hemisphere. Tiny remote lacunar infarcts also present within the thalami bilaterally. Tiny remote left pontine infarct also noted.  No mass lesion or midline shift. Very mild chronic small vessel ischemic changes noted. No acute or chronic intracranial hemorrhage. No hydrocephalus. No extra-axial fluid collection.  Craniocervical junction within normal limits. Pituitary gland unremarkable. No acute abnormality about the orbits. Sequelae of prior cataract extraction on the right. Very mild mucoperiosteal thickening present within the left maxillary sinus. 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 No mastoid effusion. Inner ear structures normal.  Degenerative changes noted within the upper cervical spine. Bone marrow signal intensity normal. Scalp soft tissues unremarkable.  No other abnormal enhancement within the brain.  MRA HEAD FINDINGS  ANTERIOR CIRCULATION:  Visualized distal portions of the cervical segments of the internal carotid arteries are widely patent bilaterally with antegrade flow. The petrous, cavernous, and supra clinoid segment of the left ICA are widely patent, although there is minimal atheromatous irregularity within the proximal cavernous segment. There is diffuse narrowing of the distal petrous, cavernous, and supra clinoid right ICA. This is likely related to a hypoplastic right A1 segment. The anterior cerebral  arteries are supplied primarily via a widely patent dominant left A1 segment. The anterior cerebral arteries themselves are widely patent and well opacified.  M1 segments patent bilaterally without focal stenosis or occlusion. MCA bifurcations within normal limits. There is distal branch atheromatous irregularity within the MCA branches bilaterally, right slightly worse than left.  POSTERIOR CIRCULATION:  Vertebral arteries are patent bilaterally to the level of the vertebrobasilar junction. There is atheromatous irregularity within the somewhat diminutive right vertebral artery with superimposed short segment severe stenosis. This is better seen on concomitant MRA of the neck (series 11607, image 5). The left vertebral artery is slightly dominant. Posterior inferior cerebellar arteries are patent proximally. There is mild multi focal atheromatous irregularity within the basilar artery without focal high-grade stenosis or occlusion. Superior cerebellar arteries patent proximally. P1 segments patent bilaterally. The left P1 segment is slightly hypoplastic with a prominent left posterior communicating artery. Distal branch atheromatous irregularity present within the PCA branches bilaterally. The distal left P2 segment is not well visualized, and may be occluded.  No aneurysm or vascular malformation.  MRI NECK FINDINGS  Visualized  portions of the aortic arch are within normal limits with normal 3 vessel morphology. No high-grade stenosis seen at the origin of the great vessels. Visualized subclavian arteries are patent bilaterally.  The right common carotid artery is well opacified to the carotid bifurcation. There is focal atheromatous plaque within the proximal right ICA with associated short segment stenosis of approximately 30%. Distally, the right ICA is well opacified.  The left common carotid artery is widely patent to the carotid bifurcation, although there is multi focal irregular plaque within the distal  left common carotid artery without focal stenosis. Atheromatous irregularity and plaque present about the left carotid bifurcation, extending into the proximal left ICA. There is associated multi focal atheromatous irregularity within the proximal left ICA with superimposed stenoses of up to approximately 50%. The distal left ICA is opacified to the skullbase.  Both vertebral arteries arise from the subclavian arteries. Vertebral arteries are fairly well opacified along their entire course to the level of the dura without focal stenosis, dissection, or occlusion. There is a superimposed high-grade stenosis within the distal right V4 segment just prior to the vertebrobasilar junction.  IMPRESSION: MRI HEAD IMPRESSION:  1. Stable size and distribution of subacute right paramedian pontine infarct as detailed above. No hemorrhagic transformation. 2. No new intracranial infarct or other abnormality identified. 3. Chronic small vessel ischemic disease with multiple remote lacunar infarcts as above.  MRA HEAD IMPRESSION:  1. No proximal branch arterial occlusion identified within the intracranial circulation. 2. Focal high-grade stenosis within the distal right V4 segment just prior to the vertebrobasilar junction. This is best seen on concomitant neck MRA portion of this exam. 3. Diffuse irregular narrowing of the petrous, cavernous, and supra clinoid right ICA. This is likely related to the hypoplastic right A1 segment, with the anterior cerebral arteries supplied mainly via the left internal carotid artery system. 4. Multi focal atheromatous irregularity within the distal MCA and PCA branches.  MRA NECK IMPRESSION:  1. Multi focal atheromatous irregularity within the proximal left ICA with associated multi focal stenoses of up to approximately 50%. 2. More mild atheromatous disease within the proximal right ICA with associated short segment stenosis of approximately 30%. 3. Short segment high-grade stenosis within the  right V4 segment as above. Vertebral arteries otherwise widely patent.   Electronically Signed   By: Jeannine Boga M.D.   On: 02/28/2015 01:49   Stanley Jeri Cos RC Contrast  02/28/2015   EXAM: MRI HEAD WITHOUT AND WITH CONTRAST AND MRA HEAD WITHOUT CONTRAST AND MRI NECK WITHOUT AND WITH CONTRAST  TECHNIQUE: Multiplanar, multiecho pulse sequences of the brain and surrounding structures were obtained without and with intravenous contrast. Angiographic images of the head were obtained using MRA technique without and with contrast. Multiplanar, multiecho pulse sequences of the neck and surrounding structures were obtained without and with intravenous contrast.  CONTRAST:  73mL MULTIHANCE GADOBENATE DIMEGLUMINE 529 MG/ML IV SOLN  COMPARISON:  None.  FINDINGS: MRI HEAD FINDINGS  There is persistent abnormal restricted diffusion in region of recently identified right pontine infarct. Overall, the size and distribution of this infarct is not significantly changed relative to prior study. The area of infarction measures approximately 12 x 14 x 10 mm. Associated ADC signal abnormality has nearly normalized. Additionally, there is patchy post-contrast enhancement within this region (Series 17, image 19). Constellation of findings most compatible with a subacute infarct.  No other abnormal foci of restricted diffusion to suggest acute intracranial infarct identified. Gray-white matter differentiation otherwise maintained.  Probable tiny remote lacunar infarct present within the right cerebellar hemisphere. Tiny remote lacunar infarcts also present within the thalami bilaterally. Tiny remote left pontine infarct also noted.  No mass lesion or midline shift. Very mild chronic small vessel ischemic changes noted. No acute or chronic intracranial hemorrhage. No hydrocephalus. No extra-axial fluid collection.  Craniocervical junction within normal limits. Pituitary gland unremarkable. No acute abnormality about the orbits.  Sequelae of prior cataract extraction on the right. Very mild mucoperiosteal thickening present within the left maxillary sinus. 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 No mastoid effusion. Inner ear structures normal.  Degenerative changes noted within the upper cervical spine. Bone marrow signal intensity normal. Scalp soft tissues unremarkable.  No other abnormal enhancement within the brain.  MRA HEAD FINDINGS  ANTERIOR CIRCULATION:  Visualized distal portions of the cervical segments of the internal carotid arteries are widely patent bilaterally with antegrade flow. The petrous, cavernous, and supra clinoid segment of the left ICA are widely patent, although there is minimal atheromatous irregularity within the proximal cavernous segment. There is diffuse narrowing of the distal petrous, cavernous, and supra clinoid right ICA. This is likely related to a hypoplastic right A1 segment. The anterior cerebral arteries are supplied primarily via a widely patent dominant left A1 segment. The anterior cerebral arteries themselves are widely patent and well opacified.  M1 segments patent bilaterally without focal stenosis or occlusion. MCA bifurcations within normal limits. There is distal branch atheromatous irregularity within the MCA branches bilaterally, right slightly worse than left.  POSTERIOR CIRCULATION:  Vertebral arteries are patent bilaterally to the level of the vertebrobasilar junction. There is atheromatous irregularity within the somewhat diminutive right vertebral artery with superimposed short segment severe stenosis. This is better seen on concomitant MRA of the neck (series 11607, image 5). The left vertebral artery is slightly dominant. Posterior inferior cerebellar arteries are patent proximally. There is mild multi focal atheromatous irregularity within the basilar artery without focal high-grade stenosis or occlusion. Superior cerebellar arteries patent proximally. P1 segments  patent bilaterally. The left P1 segment is slightly hypoplastic with a prominent left posterior communicating artery. Distal branch atheromatous irregularity present within the PCA branches bilaterally. The distal left P2 segment is not well visualized, and may be occluded.  No aneurysm or vascular malformation.  MRI NECK FINDINGS  Visualized portions of the aortic arch are within normal limits with normal 3 vessel morphology. No high-grade stenosis seen at the origin of the great vessels. Visualized subclavian arteries are patent bilaterally.  The right common carotid artery is well opacified to the carotid bifurcation. There is focal atheromatous plaque within the proximal right ICA with associated short segment stenosis of approximately 30%. Distally, the right ICA is well opacified.  The left common carotid artery is widely patent to the carotid bifurcation, although there is multi focal irregular plaque within the distal left common carotid artery without focal stenosis. Atheromatous irregularity and plaque present about the left carotid bifurcation, extending into the proximal left ICA. There is associated multi focal atheromatous irregularity within the proximal left ICA with superimposed stenoses of up to approximately 50%. The distal left ICA is opacified to the skullbase.  Both vertebral arteries arise from the subclavian arteries. Vertebral arteries are fairly well opacified along their entire course to the level of the dura without focal stenosis, dissection, or occlusion. There is a superimposed high-grade stenosis within the distal right V4 segment just prior to the vertebrobasilar junction.  IMPRESSION: MRI HEAD IMPRESSION:  1.  Stable size and distribution of subacute right paramedian pontine infarct as detailed above. No hemorrhagic transformation. 2. No new intracranial infarct or other abnormality identified. 3. Chronic small vessel ischemic disease with multiple remote lacunar infarcts as above.   MRA HEAD IMPRESSION:  1. No proximal branch arterial occlusion identified within the intracranial circulation. 2. Focal high-grade stenosis within the distal right V4 segment just prior to the vertebrobasilar junction. This is best seen on concomitant neck MRA portion of this exam. 3. Diffuse irregular narrowing of the petrous, cavernous, and supra clinoid right ICA. This is likely related to the hypoplastic right A1 segment, with the anterior cerebral arteries supplied mainly via the left internal carotid artery system. 4. Multi focal atheromatous irregularity within the distal MCA and PCA branches.  MRA NECK IMPRESSION:  1. Multi focal atheromatous irregularity within the proximal left ICA with associated multi focal stenoses of up to approximately 50%. 2. More mild atheromatous disease within the proximal right ICA with associated short segment stenosis of approximately 30%. 3. Short segment high-grade stenosis within the right V4 segment as above. Vertebral arteries otherwise widely patent.   Electronically Signed   By: Jeannine Boga M.D.   On: 02/28/2015 01:49   Dg Swallowing Func-speech Pathology  02/28/2015    Objective Swallowing Evaluation:    Patient Details  Name: Stanley Robles MRN: [id number] Date of Birth: [birth year]  Today's Date: 02/28/2015 Time: SLP Start Time (ACUTE ONLY): 0955-SLP Stop Time (ACUTE ONLY): 1036 SLP Time Calculation (min) (ACUTE ONLY): 41 min  Past Medical History:  Past Medical History  Diagnosis Date  . Hyperlipidemia   . Peripheral vascular disease   . Coronary artery disease     DR. TILLEY IS PT'S CARDIOLGIST  . Myocardial infarction 1994  . Gout     USUALLY IN THE FEET  . BPH (benign prostatic hypertrophy)   . GERD (gastroesophageal reflux disease)     ROLAIDS IF NEEDED  . 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 Tooth disease     PT STATES HE IS SCHEDULED TO HAVE BAD TOOTH PULLED 03/24/13 - WILL CHECK  WITH DR. Wynelle Link TO MAKE SURE THIS IS OK TO DO  BEFORE HIS PLANNED KNEE  REPLACEMENT ON 5/5.  Marland Kitchen Personal history of transient ischemic attack (TIA) and cerebral  infarction without residual deficit   . Chronic kidney disease stage 3   . Hypertensive heart disease   . Paroxysmal atrial fibrillation 04/01/2013  . Osteoarthritis    Past Surgical History:  Past Surgical History  Procedure Laterality Date  . Mastoid surgery x 5    . Prostate surgery    . Hernia repair  12/26/2002    BILATERAL INGUINAL HERNIA REPAIR  . Cardiac catheterization  07/21/2005  . Total knee arthroplasty Left 03/28/2013    Procedure: LEFT TOTAL KNEE ARTHROPLASTY;  Surgeon: Gearlean Alf, MD;   Location: [facility];  Service: Orthopedics;  Laterality: Left;   HPI:  HPI: 81 yo male adm with left sided weakness, worsening coughing with  po/dysphagia recently diagnosed with right pontine CVA.  PMH + for MI,  knee replacement.  Pt on blood thinner.  Swallow evaluation and speech  evaluation ordered.    No Data Recorded  Assessment / Plan / Recommendation CHL IP CLINICAL IMPRESSIONS 02/28/2015  Dysphagia Diagnosis Moderate oral phase dysphagia;Moderate pharyngeal  phase dysphagia  Clinical impression Moderate oropharyngeal dysphagia with sensorimotor  deficits resulting in delayed oral transiting, delayed pharyngeal  swallow  response.  Gross aspiration of nectar liquid via cup noted due to delayed  swallow response - barium fills pyriform sinus and spilled into open  airway prior to swallow.  Trace aspiration of thin via straw with chin  tuck noted.  Pt did not clear aspirates even with strong reflexive  coughing.  Chin tuck posture effective to prevent aspiration with all  consistencies except straw boluses of thin.  Cued dry swallows helpful to  decreased oropharyngeal resiudals.   Recommend to initiate a dys3/nectar diet, allowing tsps of thin water  between meals after oral care with very strict aspiration precautions.        CHL IP TREATMENT RECOMMENDATION 02/28/2015  Treatment Plan Recommendations Therapy  as outlined in treatment plan below      CHL IP DIET RECOMMENDATION 02/28/2015  Diet Recommendations Dysphagia 3 (Mechanical Soft);Nectar-thick liquid  Liquid Administration via Cup;Straw  Medication Administration Crushed with puree  Compensations Multiple dry swallows after each bite/sip;Slow rate;Small  sips/bites;Check for pocketing  Postural Changes and/or Swallow Maneuvers Chin tuck     CHL IP OTHER RECOMMENDATIONS 02/28/2015  Recommended Consults MBS  Oral Care Recommendations Oral care Q4 per protocol  Other Recommendations Order thickener from pharmacy;Have oral suction  available     CHL IP FOLLOW UP RECOMMENDATIONS 02/28/2015  Follow up Recommendations Inpatient Rehab     CHL IP FREQUENCY AND DURATION 02/28/2015  Speech Therapy Frequency (ACUTE ONLY) min 2x/week  Treatment Duration 2 weeks         CHL IP REASON FOR REFERRAL 02/28/2015  Reason for Referral Objectively evaluate swallowing function     CHL IP ORAL PHASE 02/28/2015  Oral Phase Impaired  Oral - Honey Teaspoon Reduced posterior propulsion  Oral - Nectar Teaspoon Reduced posterior propulsion;Delayed oral  transit;Weak lingual manipulation  Oral - Nectar Cup Reduced posterior propulsion;Delayed oral transit;Weak  lingual manipulation  Oral - Nectar Straw Reduced posterior propulsion;Delayed oral transit;Weak  lingual manipulation  Oral - Thin Cup Reduced posterior propulsion;Delayed oral transit;Weak  lingual manipulation  Oral - Thin Straw Reduced posterior propulsion;Delayed oral transit;Weak  lingual manipulation  Oral - Thin Syringe (None)  Oral - Puree Reduced posterior propulsion;Delayed oral transit;Weak  lingual manipulation  Oral - Mechanical Soft (None)  Oral - Regular Reduced posterior propulsion;Delayed oral transit;Weak  lingual manipulation      CHL IP PHARYNGEAL PHASE 02/28/2015  Pharyngeal Phase Impaired  Pharyngeal - Honey Teaspoon Delayed swallow initiation  Pharyngeal - Nectar Teaspoon Delayed swallow initiation  Penetration/Aspiration  details (nectar teaspoon) (None)  Pharyngeal - Nectar Cup Delayed swallow initiation;Premature spillage to  pyriform sinuses;Penetration/Aspiration before swallow;Significant  aspiration (Amount)  Penetration/Aspiration details (nectar cup) Material enters airway, passes  BELOW cords and not ejected out despite cough attempt by patient  Pharyngeal - Nectar Straw Delayed swallow initiation;Premature spillage to  valleculae;Premature spillage to pyriform sinuses  Pharyngeal - Thin Teaspoon Delayed swallow initiation;Premature spillage  to valleculae  Penetration/Aspiration details (thin teaspoon) (None)  Pharyngeal - Thin Cup Delayed swallow initiation;Premature spillage to  valleculae  Penetration/Aspiration details (thin cup) (None)  Pharyngeal - Thin Straw Delayed swallow initiation;Premature spillage to  valleculae;Premature spillage to pyriform sinuses  Penetration/Aspiration details (thin straw) Trace aspiration  Pharyngeal - Puree Delayed swallow initiation;Premature spillage to  valleculae  Pharyngeal - Regular Delayed swallow initiation;Premature spillage to  valleculae  Pharyngeal Comment mild amount of oral residuals prematurely spill into  pharynx without sensation and pt requiring verbal cues to swallow     CHL IP CERVICAL ESOPHAGEAL  PHASE 02/28/2015  Cervical Esophageal Phase [facility]    No flowsheet data found.         Luanna Salk, [facility] SLP [phone]    Stanley Robles Head/brain Wo Cm  02/28/2015   EXAM: MRI HEAD WITHOUT AND WITH CONTRAST AND MRA HEAD WITHOUT CONTRAST AND MRI NECK WITHOUT AND WITH CONTRAST  TECHNIQUE: Multiplanar, multiecho pulse sequences of the brain and surrounding structures were obtained without and with intravenous contrast. Angiographic images of the head were obtained using MRA technique without and with contrast. Multiplanar, multiecho pulse sequences of the neck and surrounding structures were obtained without and with intravenous contrast.  CONTRAST:  70mL MULTIHANCE GADOBENATE  DIMEGLUMINE 529 MG/ML IV SOLN  COMPARISON:  None.  FINDINGS: MRI HEAD FINDINGS  There is persistent abnormal restricted diffusion in region of recently identified right pontine infarct. Overall, the size and distribution of this infarct is not significantly changed relative to prior study. The area of infarction measures approximately 12 x 14 x 10 mm. Associated ADC signal abnormality has nearly normalized. Additionally, there is patchy post-contrast enhancement within this region (Series 17, image 19). Constellation of findings most compatible with a subacute infarct.  No other abnormal foci of restricted diffusion to suggest acute intracranial infarct identified. Gray-white matter differentiation otherwise maintained. Probable tiny remote lacunar infarct present within the right cerebellar hemisphere. Tiny remote lacunar infarcts also present within the thalami bilaterally. Tiny remote left pontine infarct also noted.  No mass lesion or midline shift. Very mild chronic small vessel ischemic changes noted. No acute or chronic intracranial hemorrhage. No hydrocephalus. No extra-axial fluid collection.  Craniocervical junction within normal limits. Pituitary gland unremarkable. No acute abnormality about the orbits. Sequelae of prior cataract extraction on the right. Very mild mucoperiosteal thickening present within the left maxillary sinus. 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 No mastoid effusion. Inner ear structures normal.  Degenerative changes noted within the upper cervical spine. Bone marrow signal intensity normal. Scalp soft tissues unremarkable.  No other abnormal enhancement within the brain.  MRA HEAD FINDINGS  ANTERIOR CIRCULATION:  Visualized distal portions of the cervical segments of the internal carotid arteries are widely patent bilaterally with antegrade flow. The petrous, cavernous, and supra clinoid segment of the left ICA are widely patent, although there is minimal atheromatous  irregularity within the proximal cavernous segment. There is diffuse narrowing of the distal petrous, cavernous, and supra clinoid right ICA. This is likely related to a hypoplastic right A1 segment. The anterior cerebral arteries are supplied primarily via a widely patent dominant left A1 segment. The anterior cerebral arteries themselves are widely patent and well opacified.  M1 segments patent bilaterally without focal stenosis or occlusion. MCA bifurcations within normal limits. There is distal branch atheromatous irregularity within the MCA branches bilaterally, right slightly worse than left.  POSTERIOR CIRCULATION:  Vertebral arteries are patent bilaterally to the level of the vertebrobasilar junction. There is atheromatous irregularity within the somewhat diminutive right vertebral artery with superimposed short segment severe stenosis. This is better seen on concomitant MRA of the neck (series 11607, image 5). The left vertebral artery is slightly dominant. Posterior inferior cerebellar arteries are patent proximally. There is mild multi focal atheromatous irregularity within the basilar artery without focal high-grade stenosis or occlusion. Superior cerebellar arteries patent proximally. P1 segments patent bilaterally. The left P1 segment is slightly hypoplastic with a prominent left posterior communicating artery. Distal branch atheromatous irregularity present within the PCA branches bilaterally. The distal left P2 segment  is not well visualized, and may be occluded.  No aneurysm or vascular malformation.  MRI NECK FINDINGS  Visualized portions of the aortic arch are within normal limits with normal 3 vessel morphology. No high-grade stenosis seen at the origin of the great vessels. Visualized subclavian arteries are patent bilaterally.  The right common carotid artery is well opacified to the carotid bifurcation. There is focal atheromatous plaque within the proximal right ICA with associated short  segment stenosis of approximately 30%. Distally, the right ICA is well opacified.  The left common carotid artery is widely patent to the carotid bifurcation, although there is multi focal irregular plaque within the distal left common carotid artery without focal stenosis. Atheromatous irregularity and plaque present about the left carotid bifurcation, extending into the proximal left ICA. There is associated multi focal atheromatous irregularity within the proximal left ICA with superimposed stenoses of up to approximately 50%. The distal left ICA is opacified to the skullbase.  Both vertebral arteries arise from the subclavian arteries. Vertebral arteries are fairly well opacified along their entire course to the level of the dura without focal stenosis, dissection, or occlusion. There is a superimposed high-grade stenosis within the distal right V4 segment just prior to the vertebrobasilar junction.  IMPRESSION: MRI HEAD IMPRESSION:  1. Stable size and distribution of subacute right paramedian pontine infarct as detailed above. No hemorrhagic transformation. 2. No new intracranial infarct or other abnormality identified. 3. Chronic small vessel ischemic disease with multiple remote lacunar infarcts as above.  MRA HEAD IMPRESSION:  1. No proximal branch arterial occlusion identified within the intracranial circulation. 2. Focal high-grade stenosis within the distal right V4 segment just prior to the vertebrobasilar junction. This is best seen on concomitant neck MRA portion of this exam. 3. Diffuse irregular narrowing of the petrous, cavernous, and supra clinoid right ICA. This is likely related to the hypoplastic right A1 segment, with the anterior cerebral arteries supplied mainly via the left internal carotid artery system. 4. Multi focal atheromatous irregularity within the distal MCA and PCA branches.  MRA NECK IMPRESSION:  1. Multi focal atheromatous irregularity within the proximal left ICA with associated  multi focal stenoses of up to approximately 50%. 2. More mild atheromatous disease within the proximal right ICA with associated short segment stenosis of approximately 30%. 3. Short segment high-grade stenosis within the right V4 segment as above. Vertebral arteries otherwise widely patent.   Electronically Signed   By: Jeannine Boga M.D.   On: 02/28/2015 01:49    Micro Results      No results found for this or any previous visit (from the past 240 hour(s)).     Today   Subjective:   Stanley Robles today has no headache,no chest abdominal pain,no new weakness tingling or numbness, feels much better.  Objective:   Blood pressure 154/55, pulse 56, temperature 99.6 F (37.6 C), temperature source Oral, resp. rate 20, height 5\' 7"  (1.702 m), weight 81.647 kg (180 lb), SpO2 94 %.   Intake/Output Summary (Last 24 hours) at 03/02/15 0859 Last data filed at 03/02/15 0810  Gross per 24 hour  Intake    520 ml  Output   1780 ml  Net  -1260 ml    Exam Awake Alert, Oriented x 3, No new F.N deficits, Normal affect, L arm 4/5 [place].AT,PERRAL Supple Neck,No JVD, No cervical lymphadenopathy appriciated.  Symmetrical Chest wall movement, Good air movement bilaterally, CTAB RRR,No Gallops,Rubs or new Murmurs, No Parasternal Heave +ve B.Sounds, Abd Soft,  Non tender, No organomegaly appriciated, No rebound -guarding or rigidity. No Cyanosis, Clubbing or edema, No new Rash or bruise  Data Review   CBC w Diff: Lab Results  Component Value Date   WBC 8.0 03/02/2015   HGB 15.6 03/02/2015   HCT 44.1 03/02/2015   PLT 79* 03/02/2015   LYMPHOPCT 14 02/27/2015   MONOPCT 6 02/27/2015   EOSPCT 1 02/27/2015   BASOPCT 0 02/27/2015    CMP: Lab Results  Component Value Date   NA 139 02/28/2015   K 4.2 02/28/2015   CL 108 02/28/2015   CO2 20 02/28/2015   BUN 23 02/28/2015   CREATININE 1.51* 02/28/2015   PROT 5.8* 02/27/2015   ALBUMIN 3.0* 02/27/2015   BILITOT 1.1 02/27/2015   ALKPHOS  75 02/27/2015   AST 15 02/27/2015   ALT 16 02/27/2015  . Lab Results  Component Value Date   CHOL 162 02/28/2015   HDL 31* 02/28/2015   LDLCALC 102* 02/28/2015   TRIG 145 02/28/2015   CHOLHDL 5.2 02/28/2015    Lab Results  Component Value Date   HGBA1C 5.8* 02/28/2015      Total Time in preparing paper work, data evaluation and todays exam - 35 minutes  Thurnell Lose M.D on 03/02/2015 at [facility]   Office  [phone]

## 2015-03-02 NOTE — Progress Notes (Signed)
Met with patient and daughter prior to discharge to discuss discharge arrangements. Pt to be transported to [facility] in [place]. [facility] provided transportation. Patient discharged at 1432.

## 2015-03-05 ENCOUNTER — Other Ambulatory Visit: Payer: Self-pay | Admitting: Internal Medicine

## 2015-03-05 ENCOUNTER — Ambulatory Visit (HOSPITAL_COMMUNITY)
Admission: RE | Admit: 2015-03-05 | Discharge: 2015-03-05 | Disposition: A | Discharge status: Home / Self Care | Payer: Medicare Other | Source: Ambulatory Visit | Attending: Internal Medicine | Admitting: Internal Medicine

## 2015-03-05 ENCOUNTER — Encounter (HOSPITAL_COMMUNITY)
Admission: RE | Admit: 2015-03-05 | Discharge: 2015-03-05 | Disposition: A | Discharge status: Home / Self Care | Payer: Medicare Other | Source: Skilled Nursing Facility | Attending: Internal Medicine | Admitting: Internal Medicine

## 2015-03-05 ENCOUNTER — Non-Acute Institutional Stay (SKILLED_NURSING_FACILITY): Payer: Medicare Other | Admitting: Internal Medicine

## 2015-03-05 DIAGNOSIS — R05 Cough: Secondary | ICD-10-CM | POA: Diagnosis present

## 2015-03-05 DIAGNOSIS — R059 Cough, unspecified: Secondary | ICD-10-CM

## 2015-03-05 DIAGNOSIS — I482 Chronic atrial fibrillation, unspecified: Secondary | ICD-10-CM

## 2015-03-05 DIAGNOSIS — I119 Hypertensive heart disease without heart failure: Secondary | ICD-10-CM

## 2015-03-05 DIAGNOSIS — I6350 Cerebral infarction due to unspecified occlusion or stenosis of unspecified cerebral artery: Secondary | ICD-10-CM | POA: Diagnosis not present

## 2015-03-05 DIAGNOSIS — I639 Cerebral infarction, unspecified: Secondary | ICD-10-CM

## 2015-03-05 LAB — COMPREHENSIVE METABOLIC PANEL
ALT: 16 U/L (ref 0–53)
AST: 16 U/L (ref 0–37)
Albumin: 2.9 g/dL — ABNORMAL LOW (ref 3.5–5.2)
Alkaline Phosphatase: 72 U/L (ref 39–117)
Anion gap: 7 (ref 5–15)
BUN: 38 mg/dL — ABNORMAL HIGH (ref 6–23)
CO2: 26 mmol/L (ref 19–32)
Calcium: 8.6 mg/dL (ref 8.4–10.5)
Chloride: 108 mmol/L (ref 96–112)
Creatinine, Ser: 1.85 mg/dL — ABNORMAL HIGH (ref 0.50–1.35)
GFR calc Af Amer: 38 mL/min — ABNORMAL LOW (ref >90)
GFR, EST NON AFRICAN AMERICAN: 33 mL/min — AB (ref >90)
GLUCOSE: 95 mg/dL (ref 70–99)
Potassium: 4.2 mmol/L (ref 3.5–5.1)
SODIUM: 141 mmol/L (ref 135–145)
Total Bilirubin: 0.5 mg/dL (ref 0.3–1.2)
Total Protein: 5.5 g/dL — ABNORMAL LOW (ref 6.0–8.3)

## 2015-03-05 LAB — CBC WITH DIFFERENTIAL/PLATELET
Basophils Absolute: 0 10*3/uL (ref 0.0–0.1)
Basophils Relative: 0 % (ref 0–1)
Eosinophils Absolute: 0.1 10*3/uL (ref 0.0–0.7)
Eosinophils Relative: 1 % (ref 0–5)
HEMATOCRIT: 46 % (ref 39.0–52.0)
HEMOGLOBIN: 15.4 g/dL (ref 13.0–17.0)
LYMPHS PCT: 16 % (ref 12–46)
Lymphs Abs: 1.5 10*3/uL (ref 0.7–4.0)
MCH: 33.2 pg (ref 26.0–34.0)
MCHC: 33.5 g/dL (ref 30.0–36.0)
MCV: 99.1 fL (ref 78.0–100.0)
MONOS PCT: 7 % (ref 3–12)
Monocytes Absolute: 0.6 10*3/uL (ref 0.1–1.0)
Neutro Abs: 7.3 10*3/uL (ref 1.7–7.7)
Neutrophils Relative %: 76 % (ref 43–77)
Platelets: 96 10*3/uL — ABNORMAL LOW (ref 150–400)
RBC: 4.64 MIL/uL (ref 4.22–5.81)
RDW: 14.2 % (ref 11.5–15.5)
SMEAR REVIEW: DECREASED
WBC: 9.6 10*3/uL (ref 4.0–10.5)

## 2015-03-06 ENCOUNTER — Ambulatory Visit: Payer: Self-pay | Admitting: Physical Therapy

## 2015-03-06 NOTE — Progress Notes (Addendum)
Patient ID: Stanley Robles, male   DOB: 1932-01-27, 81 y.o.   MRN: [id number]                 HISTORY & PHYSICAL  DATE:  03/05/2015          FACILITY: [facility]                 LEVEL OF CARE:   SNF   CHIEF COMPLAINT:  Admission to SNF, post stay at [facility], 02/27/2015 through 03/02/2015.     HISTORY OF PRESENT ILLNESS:  After discussing things with the patient and his daughter, who is present, it is apparent that this patient's symptoms began, I think, on March 30th.  He was noted by his daughter on the phone to have slurring of his speech.  The next day, he was brought to his primary physician.  An MRI was ordered as an outpatient that showed a right pontine stroke.  Apparently, the patient refused the hospitalization.  He was sent home over the weekend and deteriorated, and then was ultimately admitted to hospital.    The patient has a history of hyperlipidemia but not diabetes, hypertension, or smoking; although looking through his problem list, he has hypertension, paroxysmal atrial fibrillation and was on Coumadin at the time of the stroke with an elevated INR, according to the daughter.  The patient's Coumadin was discontinued and he was started on Eliquis at 2.5 b.i.d.     RADIOLOGY:  MRI of the brain showed a right pontine infarct, not changed relative to the prior study.  MRI showed chronic small vessel disease with multiple remote lacunar infarcts.    MRA showed no proximal branch occlusion, focal high-grade stenosis within the right V4 segment just prior to the vertebral basilar junction, multiple atheromatous irregularity within the distal MCA and PCA branches.    MRI of the neck showed irregularity within the proximal left ICA at 50%, mild disease within the right ICA.       PAST MEDICAL HISTORY/PROBLEM LIST:                Right pontine CVA.  Now on a nectar-thick diet.     Paroxysmal atrial fibrillation.  On long-term anticoagulation.  Coumadin stopped, now  on Eliquis.    Chronic renal failure stage III.    Left-sided weakness secondary to #1.    Dysphagia secondary to #1.     Hyperlipidemia.    PAD.    Coronary artery disease with a history of an MI in 1994.    Gout.    BPH.    GERD.    Personal history of TIAs.    Hypertensive heart disease.    Osteoarthritis.    PAST SURGICAL HISTORY:              Mastoid surgery x5.    Prostate surgery.    Bilateral inguinal hernia repair.    Total knee arthroplasty on the left in 2014.     CURRENT MEDICATIONS:  Medication list is reviewed.   Current Outpatient Prescriptions on File Prior to Visit  Medication Sig Dispense Refill  . acetaminophen (TYLENOL) 500 MG tablet Take 1,000 mg by mouth every 6 (six) hours as needed for pain.    Marland Kitchen allopurinol (ZYLOPRIM) 300 MG tablet Take 300 mg by mouth daily.    Marland Kitchen apixaban (ELIQUIS) 2.5 MG TABS tablet Take 1 tablet (2.5 mg total) by mouth 2 (two) times daily. 60 tablet   . atenolol (  TENORMIN) 25 MG tablet Take 1 tablet (25 mg total) by mouth 2 (two) times daily. (Patient taking differently: Take 25 mg by mouth daily. )    . colchicine 0.6 MG tablet Take 0.6 mg by mouth daily.    . CRESTOR 40 MG tablet Take 40 mg by mouth daily.    Marland Kitchen docusate sodium (COLACE) 100 MG capsule Take 100 mg by mouth daily.    Marland Kitchen docusate sodium 100 MG CAPS Take 100 mg by mouth 2 (two) times daily. 60 capsule 0  . DUREZOL 0.05 % EMUL     . finasteride (PROSCAR) 5 MG tablet Take 5 mg by mouth daily.    Marland Kitchen gentamicin (GARAMYCIN) 0.3 % ophthalmic solution     . ILEVRO 0.3 % ophthalmic suspension     . lisinopril (PRINIVIL,ZESTRIL) 10 MG tablet Take 10 mg by mouth daily.    . Maltodextrin-Xanthan Gum (RESOURCE THICKENUP CLEAR) POWD Used to thicken all liquids    . nitroGLYCERIN (NITROSTAT) 0.4 MG SL tablet Place 0.4 mg under the tongue every 5 (five) minutes as needed for chest pain.    Marland Kitchen omeprazole (PRILOSEC) 20 MG capsule Take 20 mg by mouth daily.    . polyvinyl  alcohol (LIQUIFILM TEARS) 1.4 % ophthalmic solution Place 2 drops into both eyes as needed. 15 mL   . rosuvastatin (CRESTOR) 20 MG tablet Take 20 mg by mouth every other day.     No current facility-administered medications on file prior to visit.     SOCIAL HISTORY:                   HOUSING:  The patient lives in [place] in his own home.  He has two stairs to get into the home.   FUNCTIONAL STATUS:  He was independent with ADLs and IADLs.  Still driving before this event.  He has three very involved daughters who would help him with laundry, etc.   They did not like him going down the stairs to the basement.     ADVANCED DIRECTIVES:    FAMILY HISTORY:         FATHER:  Marked history of stroke, apparently, in the patient's father.     REVIEW OF SYSTEMS:            HEENT:   No headache.   CHEST/RESPIRATORY:  No shortness of breath.     CARDIAC:  No chest pain.    GI:  No nausea, vomiting, or change in bowel habits.   GU:  States he still has good bladder control and is able to use the urinal.    PHYSICAL EXAMINATION:   GENERAL APPEARANCE:   Pleasant man.  Somewhat hard of hearing with a hearing aid in the right ear.      HEENT:   MOUTH/THROAT:  Oral exam is normal.   He has a completely absent gag reflex.   CHEST/RESPIRATORY:  Exam shows reduced air at the bases, but no crackles or wheezes.     CARDIOVASCULAR:   CARDIAC:  Heart sounds are regular.  There are no murmurs.   No carotid bruits are heard.   GASTROINTESTINAL:   LIVER/SPLEEN/KIDNEY:   No liver, no spleen.  No tenderness.    GENITOURINARY:   BLADDER:  Not enlarged.  There is no CVA tenderness.   CIRCULATION:   EDEMA/VARICOSITIES:  Extremities:  No edema.  No signs of a DVT.    VASCULAR:   ARTERIAL:  He has markedly reduced pulses.  I could feel nothing below the femoral in either leg.    NEUROLOGICAL:   CRANIAL NERVES:  Completely absent gag reflex bilaterally, although his uvula is midline.  Cranial nerve #12  deviates to the left.  Otherwise, cranial nerves are normal.   SENSATION/STRENGTH:  He has 3+/5 weakness in left arm, left leg proximally.   DEEP TENDON REFLEXES:  Reflexes are actually symmetric.  Both toes are upgoing.   BALANCE/GAIT:  I did not attempt to ambulate him.   PSYCHIATRIC:   MENTAL STATUS:    He appears somewhat depressed.  Otherwise, alert and responsive.  Although his daughter answers many questions, I think this is his hearing problem.     LABORATORY DATA:  Lab work from today shows an albumin of 2.9.   Otherwise, his comprehensive metabolic panel is reasonably normal.   He had an elevated BUN and creatinine on admission to hospital.  However, this is 38 and 1.85, respectively, at discharge which is indicative of some degree of chronic renal failure, probably hypertension.     His CBC is normal.       ASSESSMENT/PLAN:                        Right pontine stroke with dysphagia and left-sided weakness.  I did not attempt to ambulate him.  The complete absence of a gag reflex here does not bode well for improved swallowing.  However, we will need to see how things go.    Atrial fibrillation.  Apparently, his INR was initially supratherapeutic on arrival to the hospital.  Although I do not see this in [facility], this may have been true at [facility].  His heart rate is currently controlled.   His heart rate is 58.  Now on eliquis  Hypertensive heart disease.  It is crucial that we monitor this.    Hyperlipidemia.  On Crestor.    History of gout.    History of BPH.  On Proscar.  There does not appear to be an issue here.    Ordered gentamicin opth.  I am not exactly sure what the issue is here.

## 2015-03-07 ENCOUNTER — Non-Acute Institutional Stay (SKILLED_NURSING_FACILITY): Payer: Medicare Other | Admitting: Internal Medicine

## 2015-03-07 DIAGNOSIS — N183 Chronic kidney disease, stage 3 unspecified: Secondary | ICD-10-CM

## 2015-03-07 DIAGNOSIS — I480 Paroxysmal atrial fibrillation: Secondary | ICD-10-CM

## 2015-03-07 DIAGNOSIS — I6350 Cerebral infarction due to unspecified occlusion or stenosis of unspecified cerebral artery: Secondary | ICD-10-CM

## 2015-03-07 NOTE — Progress Notes (Signed)
Patient ID: Stanley Robles, male   DOB: 12-23-1931, 81 y.o.   MRN: [id number]                    FACILITY: [facility]                 LEVEL OF CARE:   SNF  This is an acute visit   CHIEF COMPLAINT:     He visit follow-up hypertension-chronic kidney disease  HISTORY OF PRESENT ILLNESS:   Patient is a pleasant elderly resident who was admitted to the hospital with a  a right pontine stroke- diagnosed by an MRI but patient initially refused hospitalization.  Marland Kitchen  He was sent home over the weekend and deteriorated, and then was ultimately admitted to hospital.    , he has hypertension, paroxysmal atrial fibrillation and was on Coumadin at the time of the stroke with an elevated INR, according to the daughter.  The patient's Coumadin was discontinued and he was started on Eliquis at 2.5 b.i.d.     He continues on lisinopril 10 mg a day as well as atenolol 25 mg twice a day for hypertension-I reviewed his blood pressures it appears he has some elevated systolics at times the highest one that I see is 184/67 I also see 134/60-today I got 160/58 manually  He does not complain of any dizziness headache or syncopal-type feelings he does have continued left-sided weakness   A.       PAST MEDICAL HISTORY/PROBLEM LIST:                Right pontine CVA.  Now on a nectar-thick diet.     Paroxysmal atrial fibrillation.  On long-term anticoagulation.  Coumadin stopped, now on Eliquis.    Chronic renal failure stage III.    Left-sided weakness secondary to #1.    Dysphagia secondary to #1.     Hyperlipidemia.    PAD.    Coronary artery disease with a history of an MI in 1994.    Gout.    BPH.    GERD.    Personal history of TIAs.    Hypertensive heart disease.    Osteoarthritis.    PAST SURGICAL HISTORY:              Mastoid surgery x5.    Prostate surgery.    Bilateral inguinal hernia repair.    Total knee arthroplasty on the left in 2014.     CURRENT  MEDICATIONS:  Medication list is reviewed.   Current Outpatient Prescriptions on File Prior to Visit  Medication Sig Dispense Refill  . acetaminophen (TYLENOL) 500 MG tablet Take 1,000 mg by mouth every 6 (six) hours as needed for pain.    Marland Kitchen allopurinol (ZYLOPRIM) 300 MG tablet Take 300 mg by mouth daily.    Marland Kitchen apixaban (ELIQUIS) 2.5 MG TABS tablet Take 1 tablet (2.5 mg total) by mouth 2 (two) times daily. 60 tablet   . atenolol (TENORMIN) 25 MG tablet Take 1 tablet (25 mg total) by mouth 2 (two) times daily. (Patient taking differently: Take 25 mg by mouth daily. )    . colchicine 0.6 MG tablet Take 0.6 mg by mouth daily.    . CRESTOR 40 MG tablet Take 40 mg by mouth daily.    Marland Kitchen docusate sodium (COLACE) 100 MG capsule Take 100 mg by mouth daily.    Marland Kitchen docusate sodium 100 MG CAPS Take 100 mg by mouth 2 (two) times daily. [place]  capsule 0  . DUREZOL 0.05 % EMUL     . finasteride (PROSCAR) 5 MG tablet Take 5 mg by mouth daily.    Marland Kitchen gentamicin (GARAMYCIN) 0.3 % ophthalmic solution     . ILEVRO 0.3 % ophthalmic suspension     . lisinopril (PRINIVIL,ZESTRIL) 10 MG tablet Take 10 mg by mouth daily.    . Maltodextrin-Xanthan Gum (RESOURCE THICKENUP CLEAR) POWD Used to thicken all liquids    . nitroGLYCERIN (NITROSTAT) 0.4 MG SL tablet Place 0.4 mg under the tongue every 5 (five) minutes as needed for chest pain.    Marland Kitchen omeprazole (PRILOSEC) 20 MG capsule Take 20 mg by mouth daily.    . polyvinyl alcohol (LIQUIFILM TEARS) 1.4 % ophthalmic solution Place 2 drops into both eyes as needed. 15 mL   . rosuvastatin (CRESTOR) 20 MG tablet Take 20 mg by mouth every other day.     No current facility-administered medications on file prior to visit.     SOCIAL HISTORY:                   HOUSING:  The patient lives in [place] in his own home.  He has two stairs to get into the home.   FUNCTIONAL STATUS:  He was independent with ADLs and IADLs.  Still driving before this event.  He has three very involved  daughters who would help him with laundry, etc.   They did not like him going down the stairs to the basement.     ADVANCED DIRECTIVES:    FAMILY HISTORY:         FATHER:  Marked history of stroke, apparently, in the patient's father.     REVIEW OF SYSTEMS:            HEENT:   No headache.   CHEST/RESPIRATORY:  No shortness of breath.     CARDIAC:  No chest pain.    GI:  No nausea, vomiting, or change in bowel habits.   GU:  Currently still has bladder control.  Neurologic does not complain of any dizziness or headache again does have a history of stroke with left-sided weakness.    PHYSICAL EXAMINATION: Temperature 97.9 pulse 60 respirations 20 blood pressure manually 160/58 weight 172.8   GENERAL APPEARANCE:   Pleasant man.  Somewhat hard of hearing with a hearing aid in the right ear.      HEENT:   MOUTH/THROAT:  Oral exam is normal.   hx completely absent gag reflex.   CHEST/RESPIRATORY:  Exam shows reduced air at the bases, but no crackles or wheezes.     CARDIOVASCULAR:   CARDIAC:  Heart sounds are regular--irregular.  There are no murmurs.     GASTROINTESTINAL:    Abdomen is soft nontender positive bowel sounds.   CIRCULATION:   EDEMA/VARICOSITIES:  Extremities:  No edema.  No signs of a DVT.    VASCULAR:   ARTERIAL:  He has markedly reduced pulses.  I could feel nothing below the femoral in either leg.    NEUROLOGICAL:   CRANIAL NERVES:   absent gag reflex bilaterally, although his uvula is midline.  Cranial nerve #12 deviates to the left.  Otherwise, cranial nerves are normal.   SENSATION/STRENGTH:  Has mild left-sided weakness compared to the right.    BALANCE/GAIT:  I did not attempt to ambulate him.   PSYCHIATRIC:   MENTAL STATUS:  , alert and responsive. But hard of hearing.  Labs.  03/05/2015.  WBC 9.6 hemoglobin  15.4 platelets 96,000.  Sodium 141 potassium 4.2 BUN 38 creatinine 1.85.  Albumin 2.9 otherwise liver function tests within normal limits   L       His CBC is normal.       ASSESSMENT/PLAN: Hypertension-appears Somewhat elevated systolics will increase his lisinopril to 20 mg a day this will have to be watched with his history of renal insufficiency-update a basic metabolic panel on Friday, April 15-also monitor blood pressures every shift with a log for provider review.  This plan was discussed with Dr. Dellia Nims via phone                        Right pontine stroke with dysphagia and left-sided weakness.  I did not attempt to ambulate him.  --He is followed by therapy-apparently he is doing relatively well and stable per staff    Atrial fibrillation.   His appears rate controlled on atenolol I see pulses in the 50s-60s it was 60 on exam today he is on Eliquis for anticoagulation  Hypertensive heart disease.  At this point appears stable   [phone]

## 2015-03-09 ENCOUNTER — Encounter (HOSPITAL_COMMUNITY)
Admission: RE | Admit: 2015-03-09 | Discharge: 2015-03-09 | Disposition: A | Discharge status: Home / Self Care | Payer: Medicare Other | Source: Skilled Nursing Facility | Attending: Internal Medicine | Admitting: Internal Medicine

## 2015-03-09 LAB — BASIC METABOLIC PANEL
Anion gap: 8 (ref 5–15)
BUN: 40 mg/dL — AB (ref 6–23)
CALCIUM: 9.1 mg/dL (ref 8.4–10.5)
CO2: 27 mmol/L (ref 19–32)
Chloride: 107 mmol/L (ref 96–112)
Creatinine, Ser: 1.77 mg/dL — ABNORMAL HIGH (ref 0.50–1.35)
GFR calc Af Amer: 40 mL/min — ABNORMAL LOW (ref >90)
GFR, EST NON AFRICAN AMERICAN: 35 mL/min — AB (ref >90)
GLUCOSE: 97 mg/dL (ref 70–99)
Potassium: 4.3 mmol/L (ref 3.5–5.1)
SODIUM: 142 mmol/L (ref 135–145)

## 2015-03-11 ENCOUNTER — Encounter: Payer: Self-pay | Admitting: Internal Medicine

## 2015-03-13 ENCOUNTER — Encounter (HOSPITAL_COMMUNITY)
Admission: RE | Admit: 2015-03-13 | Discharge: 2015-03-13 | Disposition: A | Discharge status: Home / Self Care | Payer: Medicare Other | Source: Skilled Nursing Facility | Attending: Internal Medicine | Admitting: Internal Medicine

## 2015-03-13 LAB — BASIC METABOLIC PANEL
Anion gap: 9 (ref 5–15)
BUN: 33 mg/dL — AB (ref 6–23)
CO2: 23 mmol/L (ref 19–32)
Calcium: 9.1 mg/dL (ref 8.4–10.5)
Chloride: 106 mmol/L (ref 96–112)
Creatinine, Ser: 1.59 mg/dL — ABNORMAL HIGH (ref 0.50–1.35)
GFR calc Af Amer: 46 mL/min — ABNORMAL LOW (ref >90)
GFR, EST NON AFRICAN AMERICAN: 39 mL/min — AB (ref >90)
Glucose, Bld: 100 mg/dL — ABNORMAL HIGH (ref 70–99)
Potassium: 4.2 mmol/L (ref 3.5–5.1)
Sodium: 138 mmol/L (ref 135–145)

## 2015-03-19 ENCOUNTER — Encounter (HOSPITAL_COMMUNITY)
Admission: RE | Admit: 2015-03-19 | Discharge: 2015-03-19 | Disposition: A | Discharge status: Home / Self Care | Payer: Medicare Other | Source: Skilled Nursing Facility | Attending: Internal Medicine | Admitting: Internal Medicine

## 2015-03-19 LAB — BASIC METABOLIC PANEL
Anion gap: 6 (ref 5–15)
BUN: 26 mg/dL — AB (ref 6–23)
CALCIUM: 8.7 mg/dL (ref 8.4–10.5)
CO2: 25 mmol/L (ref 19–32)
Chloride: 109 mmol/L (ref 96–112)
Creatinine, Ser: 1.38 mg/dL — ABNORMAL HIGH (ref 0.50–1.35)
GFR calc Af Amer: 54 mL/min — ABNORMAL LOW (ref >90)
GFR, EST NON AFRICAN AMERICAN: 47 mL/min — AB (ref >90)
GLUCOSE: 92 mg/dL (ref 70–99)
Potassium: 3.9 mmol/L (ref 3.5–5.1)
Sodium: 140 mmol/L (ref 135–145)

## 2015-03-28 ENCOUNTER — Ambulatory Visit (HOSPITAL_COMMUNITY): Payer: Medicare Other | Attending: Internal Medicine | Admitting: Speech Pathology

## 2015-03-28 ENCOUNTER — Ambulatory Visit (HOSPITAL_COMMUNITY)
Admit: 2015-03-28 | Discharge: 2015-03-28 | Disposition: A | Discharge status: Home / Self Care | Payer: Medicare Other | Source: Skilled Nursing Facility | Attending: Internal Medicine | Admitting: Internal Medicine

## 2015-03-28 DIAGNOSIS — R1310 Dysphagia, unspecified: Secondary | ICD-10-CM | POA: Insufficient documentation

## 2015-03-28 DIAGNOSIS — R1314 Dysphagia, pharyngoesophageal phase: Secondary | ICD-10-CM

## 2015-03-28 NOTE — Therapy (Signed)
Stanley Robles, [place], [address] Phone: [phone]   Fax:  [phone]  Modified Barium Swallow  Patient Details  Name: Stanley Robles MRN: [id number] Date of Birth: Dec 16, 1931 Referring Provider:  Ricard Dillon, MD  Encounter Date: 03/28/2015      End of Session - 03/28/15 1428    Visit Number 1   Number of Visits 1   Authorization Type [employer] Medicare   SLP Start Time 3893   SLP Stop Time  1400   SLP Time Calculation (min) 38 min   Activity Tolerance Patient tolerated treatment well      Past Medical History  Diagnosis Date  . Hyperlipidemia   . Peripheral vascular disease   . 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 Myocardial infarction 1994  . Gout   . BPH (benign prostatic hypertrophy)   . GERD (gastroesophageal reflux disease)   . 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 Tooth disease   . Personal history of transient ischemic attack (TIA) and cerebral infarction without residual deficit   . Chronic kidney disease stage 3   . Hypertensive heart disease   . Paroxysmal atrial fibrillation   . Osteoarthritis   . Right pontine stroke 02/27/2015    Past Surgical History  Procedure Laterality Date  . Mastoid surgery x 5    . Prostate surgery    . Hernia repair  12/26/2002    BILATERAL INGUINAL HERNIA REPAIR  . Cardiac catheterization  07/21/2005  . Total knee arthroplasty Left 03/28/2013    Procedure: LEFT TOTAL KNEE ARTHROPLASTY;  Surgeon: Stanley Alf, MD;  Location: [facility];  Service: Orthopedics;  Laterality: Left;    There were no vitals filed for this visit.  Visit Diagnosis: Dysphagia, pharyngoesophageal phase      Subjective Assessment - 03/28/15 1415    Subjective "I can't stand that thick stuff."   Patient is accompained by: Family member   Special Tests MBSS   Currently in Pain? No/denies             General - 03/28/15 1416    General Information   Date of Onset 02/28/15   Other Pertinent Information Stanley Robles is an 81 yo gentleman who was admitted to [facility] in April after being diagnosed with right pontine CVA. He was discharged to [facility] on a D3 diet with NTL. Pt is hopeful for upgrade to thin liquids today. He is accompanied by his daughter and SLP.   Type of Study Other (Comment)  MBSS   Reason for Referral Objectively evaluate swallowing function   Previous Swallow Assessment 02/28/2015 D3/NTL   Diet Prior to this Study Dysphagia 3 (soft);Nectar-thick liquids   Temperature Spikes Noted No   Respiratory Status Room air   History of Recent Intubation No   Behavior/Cognition Alert;Cooperative;Pleasant mood  HOH   Oral Cavity - Dentition Adequate natural dentition/normal for age   Oral Motor / Sensory Function Within functional limits   Self-Feeding Abilities Able to feed self   Patient Positioning Upright in chair/Tumbleform   Baseline Vocal Quality Normal   Volitional Cough Strong   Volitional Swallow Able to elicit   Anatomy Within functional limits   Pharyngeal Secretions Not observed secondary MBS            Oral Preparation/Oral Phase - 03/28/15 1421    Oral Preparation/Oral Phase   Oral  Phase Impaired   Oral - Solids   Oral - Regular Weak ligual manipulation;Delayed A-P transit   Electrical stimulation - Oral Phase   Was Electrical Stimulation Used No          Pharyngeal Phase - 04-09-2015 1422    Pharyngeal Phase   Pharyngeal Phase Impaired   Pharyngeal - Nectar   Pharyngeal- Nectar Cup Swallow initiation at vallecula;Delayed swallow initiation   Pharyngeal - Thin   Pharyngeal- Thin Cup Swallow initiation at vallecula;Swallow initiation at pyriform sinus;Delayed swallow initiation;Reduced airway/laryngeal closure;Penetration/Aspiration before swallow;Penetration/Aspiration during swallow;Trace aspiration;Pharyngeal residue - valleculae;Pharyngeal residue - cp segment;Compensatory  strategies attempted (with notebox)  chin tuck effective with small sips   Pharyngeal - Solids   Pharyngeal- Puree Within functional limits;Swallow initiation at vallecula   Pharyngeal- Mechanical Soft Within functional limits;Swallow initiation at vallecula  Regular texture administered   Pharyngeal- Pill Within functional limits  when taken whole in puree   Electrical Stimulation - Pharyngeal Phase   Was Electrical Stimulation Used No          Cricopharyngeal Phase - 04/09/2015 1425    Cervical Esophageal Phase   Cervical Esophageal Phase Within functional limits  Barium tablet entered stomach without delay, mild CP residuals                  Plan - 2015-04-09 1428    Clinical Impression Statement Stanley Robles was seen for repeat MBSS for possible upgrades to thin liquids. He has made significant progress since his initial MBSS (02/28/2015) and currently presents with mild oral phase and mild pharyngeal phase dysphagia characterized by mild lingual weakness/coordination with solid textures resulting in mildly delayed oral transit, variable premature spillage with consistent delay in swallow initiation, and decreased laryngeal closure resulting in variable penetration and trace aspiration of thins before and/or during the swallow and min vallecular and pyriform sinus residue after the swallow when taking large sips of thin. Pt had one episode of silent aspiration (beneath chords but was expelled) and one audible. The chin tuck combined with controlled bolus size (small sip) was effective in eliminating penetration and aspiration of thins. Recommend regular textures and thin liquids with use of chin tuck posture and cue to take small sips. Clear throat intermittently. MBSS reviewed with pt, daughter, and treating SLP and in agreement with plan of care.    Treatment/Interventions Diet toleration management by SLP;Patient/family education   Potential to Achieve Goals Good   Consulted and  Agree with Plan of Care Patient;Family member/caregiver   Family Member Consulted daughter          G-Codes - Apr 09, 2015 1438    Functional Assessment Tool Used MBSS   Functional Limitations Swallowing   Swallow Current Status [phone]) At least 1 percent but less than 20 percent impaired, limited or restricted   Swallow Goal Status (S9628) At least 1 percent but less than 20 percent impaired, limited or restricted   Swallow Discharge Status [phone]) At least 1 percent but less than 20 percent impaired, limited or restricted          Recommendations/Treatment - 2015/04/09 1426    Swallow Evaluation Recommendations   SLP Diet Recommendations Age appropriate regular solids;Thin   Liquid Administration via Cup;No straw   Medication Administration Whole meds with puree   Supervision Patient able to self feed;Full supervision/cueing for compensatory strategies   Compensations Chin tuck;Small sips/bites;Clear throat intermittently   Postural Changes Seated upright at 90 degrees  Prognosis - 03/28/15 1427    Prognosis   Prognosis for Safe Diet Advancement Good   Barriers/Prognosis Comment Pt has made good improvements since initial MBSS a month ago.   Individuals Consulted   Consulted and Agree with Results and Recommendations Patient;Family member/caregiver;Other (Comment)  Treating SLP   Family Member Consulted daughter   Report Sent to  Referring physician;Facility (Comment)      Problem List Patient Active Problem List   Diagnosis Date Noted  . CVA (cerebral infarction) 02/27/2015  . Left-sided weakness 02/27/2015  . Right pontine stroke 02/27/2015  . Dysphagia due to recent cerebral infarction 02/27/2015  . BPH (benign prostatic hyperplasia) 04/03/2013  . CAD (coronary artery disease) 04/01/2013  . Paroxysmal atrial fibrillation 04/01/2013  . Hyperlipidemia   . Hypertensive heart disease   . Personal history of transient ischemic attack (TIA) and cerebral  infarction without residual deficit   . Long-term (current) use of anticoagulants   . Chronic kidney disease stage 3    Thank you,  Genene Churn, [place]  [facility] 03/28/2015, 2:38 PM  [place], [place], [address] Phone: [phone]   Fax:  [phone]

## 2015-03-29 ENCOUNTER — Ambulatory Visit (HOSPITAL_COMMUNITY): Payer: Medicare Other | Admitting: Speech Pathology

## 2015-04-06 ENCOUNTER — Non-Acute Institutional Stay (SKILLED_NURSING_FACILITY): Payer: Medicare Other | Admitting: Internal Medicine

## 2015-04-06 ENCOUNTER — Encounter: Payer: Self-pay | Admitting: Internal Medicine

## 2015-04-06 DIAGNOSIS — I480 Paroxysmal atrial fibrillation: Secondary | ICD-10-CM | POA: Diagnosis not present

## 2015-04-06 DIAGNOSIS — I10 Essential (primary) hypertension: Secondary | ICD-10-CM | POA: Diagnosis not present

## 2015-04-06 DIAGNOSIS — I6350 Cerebral infarction due to unspecified occlusion or stenosis of unspecified cerebral artery: Secondary | ICD-10-CM | POA: Diagnosis not present

## 2015-04-06 DIAGNOSIS — N183 Chronic kidney disease, stage 3 unspecified: Secondary | ICD-10-CM

## 2015-04-06 NOTE — Progress Notes (Signed)
Patient ID: Stanley Robles, male   DOB: 09-08-1932, 81 y.o.   MRN: [id number]                   FACILITY: [facility]                 LEVEL OF CARE:   SNF  This is a discharge note   CHIEF COMPLAINT: Discharge note     HISTORY OF PRESENT ILLNESS:   Patient is a pleasant elderly resident who was admitted to the hospital with a  a right pontine stroke- diagnosed by an MRI but patient initially refused hospitalization.  Marland Kitchen  He was sent home over the weekend and deteriorated, and then was ultimately admitted to hospital.    , he has hypertension, paroxysmal atrial fibrillation and was on Coumadin at the time of the stroke with an elevated INR, according to the daughter.  The patient's Coumadin was discontinued and he was started on Eliquis at 2.5 b.i.d.     He continues on lisinopril 20 mg a day as well as atenolol 25 mg twice a day for hypertension-I  Systolics did appear to be bit elevated and I increased his lisinopril to 20 mg a day --recent blood pressures 136/45 and 155/71 132/61 this appears to be gradually improving  He does not complain of any dizziness headache or syncopal-type feelings he does have some residual left-sided weakness but this has improved during his stay here  Patient will be going home he is comfortable with thisshe will need PT and OT for further strengthening as well as a wheelchair he will have a Lifeline He states he will be with one of his daughters .       PAST MEDICAL HISTORY/PROBLEM LIST:                Right pontine CVA.  Now on a nectar-thick diet.     Paroxysmal atrial fibrillation.  On long-term anticoagulation.  Coumadin stopped, now on Eliquis.    Chronic renal failure stage III.    Left-sided weakness secondary to #1.    Dysphagia secondary to #1.     Hyperlipidemia.    PAD.    Coronary artery disease with a history of an MI in 1994.    Gout.    BPH.    GERD.    Personal history of TIAs.    Hypertensive heart  disease.    Osteoarthritis.    PAST SURGICAL HISTORY:              Mastoid surgery x5.    Prostate surgery.    Bilateral inguinal hernia repair.    Total knee arthroplasty on the left in 2014.     Medications.  Tylenol thousand milligrams every 6 hours when necessary.  Allopurinol 300 mg daily.  Atenolol 25 mg twice a day.  Colchicine 0.6 mg daily.  Crestor 40 mg daily.  Colace 100 mg daily.  Eliquis 2.5 mg twice a day.  Proscar 5 mg daily.  Lisinopril 20 mg daily.  Nitrostat when necessary.  Prilosec 20 mg daily.  Phenergan when necessary every 6 hours 12.5 mg.   .:. :.   Medication Sig Dispense Refill  .       .      .       .      .      .      .      .    0  .      .       .      .      .      .      .      .      .      Marland Kitchen  No current facility-administered medications on file prior to visit.     SOCIAL HISTORY:                   HOUSING:  The patient lives in [place] in his own home.  He has two stairs to get into the home.   FUNCTIONAL STATUS:  He was independent with ADLs and IADLs.  Still driving before this event.  He has three very involved daughters who would help him with laundry, etc.   They did not like him going down the stairs to the basement.--States one  of his daughters will be with them at home      ADVANCED DIRECTIVES:    FAMILY HISTORY:         FATHER:  Marked history of stroke, apparently, in the patient's father.     REVIEW OF SYSTEMS general says he feels well is pleased with the progress he's made here.  Skin does not complain any rashes or itching :            HEENT:   No headache. result changes   CHEST/RESPIRATORY:  No shortness of breath.     CARDIAC:  No chest pain.    GI:  No nausea, vomiting, or change in bowel habits.   GU:  Currently still has bladder control.  Neurologic does not complain of any dizziness or headache again does have a history of stroke with left-sided weakness. again  he has made good strides in this regard    PHYSICAL EXAMINATION: Temperature 97.9 pulse 60 respirations 18 blood pressure 136/45 weight is 180.2 appears he's gained about 5 pounds during his stay here-I suspect this is due to a better appetite clinical improvement as well   GENERAL APPEARANCE:   Pleasant man.  Somewhat hard of hearing with a hearing aid in the right ear.  His skin is warm and dry.        HEENT:   MOUTH/THROAT:  Oral exam is normal.   's membranes moist.  He does have prescription lenses visual acuity appears intact.   CHEST/RESPIRATORY:  Exam shows reduced air at the bases, but no crackles or wheezes.     CARDIOVASCULAR:   CARDIAC:  Heart sounds are regular-.  There are no murmurs.--Has mild lower extremity ankle edema      GASTROINTESTINAL:    Abdomen is soft nontender positive bowel sounds.   CIRCULATION:   EDEMA/VARICOSITIES:  Extremities:  Minimal edema.  No signs of a DVT.    VASCULAR:   ARTERIAL:  He has markedly reduced pulses.  I could feel nothing below the femoral in either leg.    NEUROLOGICAL:    .  Cranial nerve #12 deviates to the left.  Otherwise, cranial nerves are normal.speech is relatively clear    SENSATION/STRENGTH:  Has mild left-sided weakness compared to the right. --grip strength is quite good bilaterally.  He does use a walker apparently with therapy but is ambulating in a wheelchair today-he appears to have decent strength bilaterally at the hip flexors    BALANCE/GAIT:  I did not attempt to ambulate him.   PSYCHIATRIC:   MENTAL STATUS:  , alert and responsive. But hard of hearing appears grossly oriented .  Labs   03/19/2015.  Sodium 1 potassium 3.9 BUN 26 creatinine 1.38.   .  03/05/2015.  WBC 9.6 hemoglobin 15.4 platelets 96,000.  Sodium 141 potassium 4.2 BUN 38 creatinine 1.85.  Albumin 2.9 otherwise liver function tests within normal limits  His CBC is normal.       ASSESSMENT/PLAN: Hypertension-this  appears to be under somewhat better control since lisinopril was increased will warrant follow up by primary care provider and he is also on atenolol-- .                        Right pontine stroke with dysphagia and left-sided weakness.  Talking with staff appears he's made significant progress here will need continued PT and OT at home will need a wheelchair for ambulation   Atrial fibrillation.   His appears rate controlled on atenolol I see pulses in the 50s-60s it was 60 on exam today he is on Eliquis for anticoagulation  Hypertensive heart disease.  At this point appears stable  Hyperlipidemia continues on Crestor will defer follow-up to primary care provider since his stay here is been quite short.                                                                                              Thrombocytopenia-this appears somewhat chronic per chart review would like to update CBC before discharge also will check a BMP as well  History of gout-no recent flares during his stay here he is on colchicine and allopurinol  History of chronic kidney disease-appears his creatinine has slowly improved during his stay here--it was down to 1.38 on April 25 with a BUN of 26                                                                                                 History of BPH continue Zocor scar this has not really been an issue during his stay here.    Again patient will be going home will need a wheelchair as well as a Lifeline states he will be with his daughter he will need continued PT and OT     [id number]-of note greater than 30 minutes spent on this discharge summary

## 2015-04-07 ENCOUNTER — Encounter (HOSPITAL_COMMUNITY)
Admission: RE | Admit: 2015-04-07 | Discharge: 2015-04-07 | Disposition: A | Discharge status: Home / Self Care | Payer: Medicare Other | Source: Skilled Nursing Facility | Attending: Internal Medicine | Admitting: Internal Medicine

## 2015-04-07 LAB — BASIC METABOLIC PANEL
ANION GAP: 7 (ref 5–15)
BUN: 33 mg/dL — ABNORMAL HIGH (ref 6–20)
CALCIUM: 9.1 mg/dL (ref 8.9–10.3)
CHLORIDE: 106 mmol/L (ref 101–111)
CO2: 26 mmol/L (ref 22–32)
Creatinine, Ser: 1.56 mg/dL — ABNORMAL HIGH (ref 0.61–1.24)
GFR calc Af Amer: 47 mL/min — ABNORMAL LOW (ref >60)
GFR calc non Af Amer: 40 mL/min — ABNORMAL LOW (ref >60)
Glucose, Bld: 90 mg/dL (ref 65–99)
POTASSIUM: 3.7 mmol/L (ref 3.5–5.1)
Sodium: 139 mmol/L (ref 135–145)

## 2015-04-07 LAB — CBC
HCT: 39.2 % (ref 39.0–52.0)
Hemoglobin: 13.5 g/dL (ref 13.0–17.0)
MCH: 34 pg (ref 26.0–34.0)
MCHC: 34.4 g/dL (ref 30.0–36.0)
MCV: 98.7 fL (ref 78.0–100.0)
Platelets: 77 10*3/uL — ABNORMAL LOW (ref 150–400)
RBC: 3.97 MIL/uL — ABNORMAL LOW (ref 4.22–5.81)
RDW: 14.5 % (ref 11.5–15.5)
WBC: 4.5 10*3/uL (ref 4.0–10.5)

## 2015-04-08 DIAGNOSIS — N183 Chronic kidney disease, stage 3 (moderate): Secondary | ICD-10-CM | POA: Diagnosis not present

## 2015-04-08 DIAGNOSIS — I69354 Hemiplegia and hemiparesis following cerebral infarction affecting left non-dominant side: Secondary | ICD-10-CM | POA: Diagnosis not present

## 2015-04-08 DIAGNOSIS — I480 Paroxysmal atrial fibrillation: Secondary | ICD-10-CM | POA: Diagnosis not present

## 2015-04-08 DIAGNOSIS — I129 Hypertensive chronic kidney disease with stage 1 through stage 4 chronic kidney disease, or unspecified chronic kidney disease: Secondary | ICD-10-CM | POA: Diagnosis not present

## 2015-05-03 ENCOUNTER — Encounter: Payer: Self-pay | Admitting: Neurology

## 2015-05-03 ENCOUNTER — Ambulatory Visit (INDEPENDENT_AMBULATORY_CARE_PROVIDER_SITE_OTHER): Payer: Medicare Other | Admitting: Neurology

## 2015-05-03 VITALS — BP 176/70 | HR 57 | Ht 67.0 in | Wt 178.0 lb

## 2015-05-03 DIAGNOSIS — I6350 Cerebral infarction due to unspecified occlusion or stenosis of unspecified cerebral artery: Secondary | ICD-10-CM

## 2015-05-03 NOTE — Patient Instructions (Signed)
I had a long d/w patient and wife about his recent stroke, risk for recurrent stroke/TIAs, personally independently reviewed imaging studies and stroke evaluation results and answered questions.Continue Eliquis for atrial fibrillation for secondary stroke prevention and maintain strict control of hypertension with blood pressure goal below 130/90, diabetes with hemoglobin A1c goal below 6.5% and lipids with LDL cholesterol goal below 100 mg/dL. I also advised the patient to eat a healthy diet with plenty of whole grains, cereals, fruits and vegetables, exercise regularly and maintain ideal body weight Followup in the future with me in  6 months or call earlier if necessary Stroke Prevention Some medical conditions and behaviors are associated with an increased chance of having a stroke. You may prevent a stroke by making healthy choices and managing medical conditions. HOW CAN I REDUCE MY RISK OF HAVING A STROKE?   Stay physically active. Get at least 30 minutes of activity on most or all days.  Do not smoke. It may also be helpful to avoid exposure to secondhand smoke.  Limit alcohol use. Moderate alcohol use is considered to be:  No more than 2 drinks per day for men.  No more than 1 drink per day for nonpregnant women.  Eat healthy foods. This involves:  Eating 5 or more servings of fruits and vegetables a day.  Making dietary changes that address high blood pressure (hypertension), high cholesterol, diabetes, or obesity.  Manage your cholesterol levels.  Making food choices that are high in fiber and low in saturated fat, trans fat, and cholesterol may control cholesterol levels.  Take any prescribed medicines to control cholesterol as directed by your health care provider.  Manage your diabetes.  Controlling your carbohydrate and sugar intake is recommended to manage diabetes.  Take any prescribed medicines to control diabetes as directed by your health care provider.  Control  your hypertension.  Making food choices that are low in salt (sodium), saturated fat, trans fat, and cholesterol is recommended to manage hypertension.  Take any prescribed medicines to control hypertension as directed by your health care provider.  Maintain a healthy weight.  Reducing calorie intake and making food choices that are low in sodium, saturated fat, trans fat, and cholesterol are recommended to manage weight.  Stop drug abuse.  Avoid taking birth control pills.  Talk to your health care provider about the risks of taking birth control pills if you are over 30 years old, smoke, get migraines, or have ever had a blood clot.  Get evaluated for sleep disorders (sleep apnea).  Talk to your health care provider about getting a sleep evaluation if you snore a lot or have excessive sleepiness.  Take medicines only as directed by your health care provider.  For some people, aspirin or blood thinners (anticoagulants) are helpful in reducing the risk of forming abnormal blood clots that can lead to stroke. If you have the irregular heart rhythm of atrial fibrillation, you should be on a blood thinner unless there is a good reason you cannot take them.  Understand all your medicine instructions.  Make sure that other conditions (such as anemia or atherosclerosis) are addressed. SEEK IMMEDIATE MEDICAL CARE IF:   You have sudden weakness or numbness of the face, arm, or leg, especially on one side of the body.  Your face or eyelid droops to one side.  You have sudden confusion.  You have trouble speaking (aphasia) or understanding.  You have sudden trouble seeing in one or both eyes.  You have  sudden trouble walking.  You have dizziness.  You have a loss of balance or coordination.  You have a sudden, severe headache with no known cause.  You have new chest pain or an irregular heartbeat. Any of these symptoms may represent a serious problem that is an emergency. Do not  wait to see if the symptoms will go away. Get medical help at once. Call your local emergency services (911 in [place] Do not drive yourself to the hospital. Document Released: 12/18/2004 Document Revised: 03/27/2014 Document Reviewed: 05/13/2013 [facility] Patient Information 2015 [place], [place]. This information is not intended to replace advice given to you by your health care provider. Make sure you discuss any questions you have with your health care provider.

## 2015-05-03 NOTE — Progress Notes (Signed)
[facility] [address] [place]. [place] [address] [phone]       OFFICE FOLLOW-UP NOTE  [name] Date of Birth:  Dec 17, 1931 Medical Record Number:  [id number]   HPI: 73 year Caucasian male seen today for first office follow-up visit for hospital admission for stroke on 02/27/14. He is accompanied by his wife. The patient presented with three-day history of left-sided weakness which gradually got worse. MRI obtained at [facility] showed right pontine infarct. Patient was transferred to [facility] for further evaluation. MRA of the brain showed no large vessel stenosis or occlusion. MRA of the neck showed mild irregularity of the left proximal ICA with 50% stenosis. transthoracic echo showed normal ejection fraction. Hemoglobin A1c was 5.8. LDL cholesterol was borderline at 102. Patient was on warfarin at the time of admission but INR was suboptimal. Patient was switched to eliquis which is tolerating well with minor bruising but no major bleeding. He states his blood pressure is well controlled though it is elevated in office today at 176/70. He has noted significant improvement in his left hand weakness but still has a little swallowing difficulties. His speech is also a lot better. He is been having a lot of nocturia and has not been sleeping well. He has seen a urologist a few weeks ago who gave him some samples of medication which seems to be helping. He plans to have left eye cataract surgery in a few weeks. He also wants to drive.  ROS:   14 system review of systems is positive for  cough, drooling, restless leg, frequent waking, incontinence of bladder, easy bruising, bleeding, memory loss, speech difficulty , nocturia, not sleeping well and all other systems negative  PMH:  Past Medical History  Diagnosis Date  . Hyperlipidemia   . Peripheral vascular disease   . Coronary artery disease     DR. TILLEY IS PT'S CARDIOLGIST  . Myocardial  infarction 1994  . Gout   . BPH (benign prostatic hypertrophy)   . GERD (gastroesophageal reflux disease)   . 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 Tooth disease   . Personal history of transient ischemic attack (TIA) and cerebral infarction without residual deficit   . Chronic kidney disease stage 3   . Hypertensive heart disease   . Paroxysmal atrial fibrillation   . Osteoarthritis   . Right pontine stroke 02/27/2015  . Memory loss   . Incontinence of urine     Social History:  History   Social History  . Marital Status: Widowed    Spouse Name: N/A  . Number of Children: 3  . Years of Education: 13   Occupational History  . retired     Regulatory affairs officer   Social History Main Topics  . Smoking status: Former Research scientist (life sciences)  . Smokeless tobacco: Never Used     Comment: QUIT SMOKING PRIOR TO 1980  . Alcohol Use: No  . Drug Use: No  . Sexual Activity: Not on file   Other Topics Concern  . 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 acetaminophen (TYLENOL) 500 MG tablet Take 1,000 mg by mouth every 6 (six) hours as needed  for pain.    Marland Kitchen allopurinol (ZYLOPRIM) 300 MG tablet Take 300 mg by mouth daily.    Marland Kitchen apixaban (ELIQUIS) 2.5 MG TABS tablet Take 1 tablet (2.5 mg total) by mouth 2 (two) times daily. 60 tablet   . atenolol (TENORMIN) 25 MG tablet Take 1 tablet (25 mg total) by mouth 2 (two) times daily. (Patient taking differently: Take 25 mg by mouth daily. )    . colchicine 0.6 MG tablet Take 0.6 mg by mouth daily.    . CRESTOR 40 MG tablet Take 40 mg by mouth daily.    Marland Kitchen docusate sodium (COLACE) 100 MG capsule Take 100 mg by mouth daily.    . finasteride (PROSCAR) 5 MG tablet Take 5 mg by mouth daily.    . nitroGLYCERIN (NITROSTAT) 0.4 MG SL  tablet Place 0.4 mg under the tongue every 5 (five) minutes as needed for chest pain.    Marland Kitchen omeprazole (PRILOSEC) 20 MG capsule Take 20 mg by mouth daily.    . rosuvastatin (CRESTOR) 20 MG tablet Take 20 mg by mouth every other day.    . ILEVRO 0.3 % ophthalmic suspension     . polyvinyl alcohol (LIQUIFILM TEARS) 1.4 % ophthalmic solution Place 2 drops into both eyes as needed. 15 mL    No current facility-administered medications on file prior to visit.    Allergies:   Allergies  Allergen Reactions  . Lipitor [Atorvastatin] Other (See Comments)    Severe constipation    Physical Exam General: well developed, well nourished elderly Caucasian male, seated, in no evident distress Head: head normocephalic and atraumatic.  Neck: supple with no carotid or supraclavicular bruits Cardiovascular: regular rate and rhythm, no murmurs Musculoskeletal: no deformity Skin:  no rash/petichiae Vascular:  Normal pulses all extremities Filed Vitals:   05/03/15 1406  BP: 176/70  Pulse:    Neurologic Exam Mental Status: Awake and fully alert. Oriented to place and time. Recent and remote memory intact. Attention span, concentration and fund of knowledge appropriate. Mood and affect appropriate.  Cranial Nerves: Fundoscopic exam reveals sharp disc margins. Pupils equal, briskly reactive to light. Extraocular movements full without nystagmus. Visual fields full to confrontation. Hearing diminished bilaterally.. Facial sensation intact. Face, tongue, palate moves normally and symmetrically.  Motor: Normal bulk and tone. Normal strength in all tested extremity muscles. Diminished fine finger movements on the left. Mild left grip weakness. Orbits right over left approximately. Sensory.: intact to touch ,pinprick .position and vibratory sensation.  Coordination: Rapid alternating movements normal in all extremities. Finger-to-nose and heel-to-shin performed accurately bilaterally. Gait and Station: Arises  from chair without difficulty. Stance is normal. Gait demonstrates normal stride length and balance . Able to heel, toe and tandem walk with mild difficulty.  Reflexes: 1+ and symmetric. Toes downgoing.   NIHSS 0 Modified Rankin  1   ASSESSMENT: 81 year male with  Right pontine infarct in April 2016 due to right vertebral artery stenosis/occlusion with h/o chronic atrial fibrillation     PLAN: I had a long d/w patient and wife about his recent stroke, risk for recurrent stroke/TIAs, personally independently reviewed imaging studies and stroke evaluation results and answered questions.Continue Eliquis for atrial fibrillation for secondary stroke prevention and maintain strict control of hypertension with blood pressure goal below 130/90, diabetes with hemoglobin A1c goal below 6.5% and lipids with LDL cholesterol goal below 100 mg/dL. I also advised the patient to eat a healthy diet with plenty of whole grains, cereals, fruits and vegetables, exercise  regularly and maintain ideal body weight .I also advised him to see his primary physician if his dysphagia and swallowing difficulties do not improve soon. Followup in the future with me in  6 months or call earlier if necessary  Antony Contras, MD  Note: This document was prepared with digital dictation and possible smart phrase technology. Any transcriptional errors that result from this process are unintentional

## 2015-05-29 ENCOUNTER — Ambulatory Visit: Payer: Self-pay | Admitting: Neurology

## 2015-10-30 ENCOUNTER — Ambulatory Visit (INDEPENDENT_AMBULATORY_CARE_PROVIDER_SITE_OTHER): Payer: Medicare Other | Admitting: Neurology

## 2015-10-30 ENCOUNTER — Encounter: Payer: Self-pay | Admitting: Neurology

## 2015-10-30 VITALS — BP 166/66 | HR 56 | Ht 67.0 in | Wt 181.8 lb

## 2015-10-30 DIAGNOSIS — I6990 Unspecified sequelae of unspecified cerebrovascular disease: Secondary | ICD-10-CM

## 2015-10-30 NOTE — Patient Instructions (Signed)
I had a long d/w patient about his remote stroke, risk for recurrent stroke/TIAs, personally independently reviewed imaging studies and stroke evaluation results and answered questions.Continue eliquis for atrial fibrillation  for secondary stroke prevention and maintain strict control of hypertension with blood pressure goal below 130/90, diabetes with hemoglobin A1c goal below 6.5% and lipids with LDL cholesterol goal below 70 mg/dL. I also advised the patient to eat a healthy diet with plenty of whole grains, cereals, fruits and vegetables, exercise regularly and maintain ideal body weight .the patient was also neurologically cleared for cataract surgery on his left eye and was advised to discuss with his eye doctor if eliquis could be continued. He could hold it for 3 days prior to the procedure with the small and acceptable periprocedural risk of TIA/stroke if acceptable to him. Followup in the future with me in one year or call earlier if necessary

## 2015-10-30 NOTE — Progress Notes (Signed)
[facility] [address] [place]. [place] [address] [phone]       OFFICE FOLLOW-UP NOTE  [name] Date of Birth:  29-Jun-1932 Medical Record Number:  [id number]   HPI: 44 year Caucasian male seen today for first office follow-up visit for hospital admission for stroke on 02/27/14. He is accompanied by his wife. The patient presented with three-day history of left-sided weakness which gradually got worse. MRI obtained at [facility] showed right pontine infarct. Patient was transferred to [facility] for further evaluation. MRA of the brain showed no large vessel stenosis or occlusion. MRA of the neck showed mild irregularity of the left proximal ICA with 50% stenosis. transthoracic echo showed normal ejection fraction. Hemoglobin A1c was 5.8. LDL cholesterol was borderline at 102. Patient was on warfarin at the time of admission but INR was suboptimal. Patient was switched to eliquis which is tolerating well with minor bruising but no major bleeding. He states his blood pressure is well controlled though it is elevated in office today at 176/70. He has noted significant improvement in his left hand weakness but still has a little swallowing difficulties. His speech is also a lot better. He is been having a lot of nocturia and has not been sleeping well. He has seen a urologist a few weeks ago who gave him some samples of medication which seems to be helping. He plans to have left eye cataract surgery in a few weeks. He also wants to drive. Update 10/30/2015 : He returns for follow-up after last visit 6 months ago. He continues to do well without recurrent stroke or TIA symptoms. He states his blood pressures typically quite good but today it is slightly elevated 163/66 in office. His been compliant with his medicines. He remains on eliquis which is tolerating well without bleeding or bruising. He had recent lipid profile checked by his cardiologist and LDL  cholesterol was quite low and hence he was asked to reduce the dose of Crestor from 40 to 20 mg daily which is happy to do. He has a cataract in the left eye and is thinking about having surgery done and has questions about holding eliquis and stroke risk from that. ROS:   14 system review of systems is positive for   hearing loss, ringing in the ears, runny nose, drooling, cough, restless legs, insomnia, frequency of urination, back pain, walking difficulty, facial drooping, weakness and all other systems negative  PMH:  Past Medical History  Diagnosis Date  . Hyperlipidemia   . Peripheral vascular disease ([place])   . Coronary artery disease     DR. TILLEY IS PT'S CARDIOLGIST  . Myocardial infarction ([place]) 1994  . Gout   . BPH (benign prostatic hypertrophy)   . GERD (gastroesophageal reflux disease)   . 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 Tooth disease   . Personal history of transient ischemic attack (TIA) and cerebral infarction without residual deficit   . Chronic kidney disease stage 3   . Hypertensive heart disease   . Paroxysmal atrial fibrillation (HCC)   . Osteoarthritis   . Right pontine stroke ([place]) 02/27/2015  . Memory loss   . Incontinence of urine     Social History:  Social History   Social History  . Marital Status: Widowed    Spouse Name: N/A  . Number of Children: 3  . Years of Education: 13   Occupational History  .  retired     Regulatory affairs officer   Social History Main Topics  . Smoking status: Former Research scientist (life sciences)  . Smokeless tobacco: Never Used     Comment: QUIT SMOKING PRIOR TO 1980  . Alcohol Use: No  . Drug Use: No  . Sexual Activity: Not on file   Other Topics Concern  . Not on file   Social History Narrative   Retired Academic librarian and body.  Widower. Has sig other   Right handed   Caffeine use - coffee 1 cup daily    Medications:   Current Outpatient Prescriptions on File Prior to Visit  Medication Sig Dispense  Refill  . acetaminophen (TYLENOL) 500 MG tablet Take 1,000 mg by mouth every 6 (six) hours as needed for pain.    Marland Kitchen apixaban (ELIQUIS) 2.5 MG TABS tablet Take 1 tablet (2.5 mg total) by mouth 2 (two) times daily. 60 tablet   . atenolol (TENORMIN) 25 MG tablet Take 1 tablet (25 mg total) by mouth 2 (two) times daily. (Patient taking differently: Take 25 mg by mouth daily. )    . docusate sodium (COLACE) 100 MG capsule Take 100 mg by mouth daily.    . finasteride (PROSCAR) 5 MG tablet Take 5 mg by mouth daily.    . ILEVRO 0.3 % ophthalmic suspension     . nitroGLYCERIN (NITROSTAT) 0.4 MG SL tablet Place 0.4 mg under the tongue every 5 (five) minutes as needed for chest pain.    Marland Kitchen omeprazole (PRILOSEC) 20 MG capsule Take 20 mg by mouth daily.    . rosuvastatin (CRESTOR) 20 MG tablet Take 20 mg by mouth every other day.     No current facility-administered medications on file prior to visit.    Allergies:   Allergies  Allergen Reactions  . Lipitor [Atorvastatin] Other (See Comments)    Severe constipation    Physical Exam General: well developed, well nourished elderly Caucasian male, seated, in no evident distress Head: head normocephalic and atraumatic.  Neck: supple with no carotid or supraclavicular bruits Cardiovascular: regular rate and rhythm, no murmurs Musculoskeletal: no deformity Skin:  no rash/petichiae Vascular:  Normal pulses all extremities Filed Vitals:   10/30/15 1322  BP: 166/66  Pulse: 56   Neurologic Exam Mental Status: Awake and fully alert. Oriented to place and time. Recent and remote memory intact. Attention span, concentration and fund of knowledge appropriate. Mood and affect appropriate.  Cranial Nerves: Fundoscopic exam not done. . Pupils equal, briskly reactive to light. Extraocular movements full without nystagmus. Visual fields full to confrontation. Hearing diminished bilaterally.. Facial sensation intact. Face, tongue, palate moves normally and  symmetrically.  Motor: Normal bulk and tone. Normal strength in all tested extremity muscles. Diminished fine finger movements on the left. Mild left grip weakness. Orbits right over left approximately. Sensory.: intact to touch ,pinprick .position and vibratory sensation.  Coordination: Rapid alternating movements normal in all extremities. Finger-to-nose and heel-to-shin performed accurately bilaterally. Gait and Station: Arises from chair without difficulty. Stance is normal. Gait demonstrates normal stride length and balance . Able to heel, toe and tandem walk with mild difficulty.  Reflexes: 1+ and symmetric. Toes downgoing.   NIHSS 0 Modified Rankin  1   ASSESSMENT: 81 year male with  Right pontine infarct in April 2016 due to right vertebral artery stenosis/occlusion with h/o chronic atrial fibrillation     PLAN: I had a long d/w patient about his remote stroke, risk for recurrent stroke/TIAs, personally independently reviewed imaging studies and  stroke evaluation results and answered questions.Continue eliquis for atrial fibrillation  for secondary stroke prevention and maintain strict control of hypertension with blood pressure goal below 130/90, diabetes with hemoglobin A1c goal below 6.5% and lipids with LDL cholesterol goal below 70 mg/dL. I also advised the patient to eat a healthy diet with plenty of whole grains, cereals, fruits and vegetables, exercise regularly and maintain ideal body weight .the patient was also neurologically cleared for cataract surgery on his left eye and was advised to discuss with his eye doctor if eliquis could be continued. He could hold it for 3 days prior to the procedure with the small and acceptable periprocedural risk of TIA/stroke if acceptable to him. Followup in the future with me in one year or call earlier if necessary   Antony Contras, MD  Note: This document was prepared with digital dictation and possible smart phrase technology. Any  transcriptional errors that result from this process are unintentional

## 2015-11-15 ENCOUNTER — Telehealth: Payer: Self-pay

## 2015-11-15 NOTE — Telephone Encounter (Signed)
Rn call [facility] about patient needing clearance. Rn stated Dr. Leonie Man will be out of the office until January 3. Rn stated he is seeing patients at the hospital till Friday. Rn ask if the form could wait till January 3. Kimberly at the office stated yes it could wait for that date.

## 2015-11-15 NOTE — Telephone Encounter (Signed)
Rn call the [facility] about pt clearance. Rn explain in patients last office note on 10-30-15 he cleared the patient for surgery and gave neuro clearance. Rn fax the office note to Dr.Christopher Manuella Ghazi and Freda Munro. Office note was fax.

## 2016-02-19 ENCOUNTER — Encounter (HOSPITAL_COMMUNITY): Payer: Self-pay

## 2016-10-27 ENCOUNTER — Ambulatory Visit (INDEPENDENT_AMBULATORY_CARE_PROVIDER_SITE_OTHER): Payer: Medicare Other | Admitting: Neurology

## 2016-10-27 ENCOUNTER — Encounter: Payer: Self-pay | Admitting: Neurology

## 2016-10-27 VITALS — BP 178/64 | HR 51 | Wt 186.2 lb

## 2016-10-27 DIAGNOSIS — I6990 Unspecified sequelae of unspecified cerebrovascular disease: Secondary | ICD-10-CM

## 2016-10-27 NOTE — Progress Notes (Signed)
[facility] [address] [place]. [place] [address] [phone]       OFFICE FOLLOW-UP NOTE  [name] Date of Birth:  10-09-1932 Medical Record Number:  [id number]   HPI: 20 year Caucasian male seen today for first office follow-up visit for hospital admission for stroke on 02/27/14. He is accompanied by his wife. The patient presented with three-day history of left-sided weakness which gradually got worse. MRI obtained at [facility] showed right pontine infarct. Patient was transferred to [facility] for further evaluation. MRA of the brain showed no large vessel stenosis or occlusion. MRA of the neck showed mild irregularity of the left proximal ICA with 50% stenosis. transthoracic echo showed normal ejection fraction. Hemoglobin A1c was 5.8. LDL cholesterol was borderline at 102. Patient was on warfarin at the time of admission but INR was suboptimal. Patient was switched to eliquis which is tolerating well with minor bruising but no major bleeding. He states his blood pressure is well controlled though it is elevated in office today at 176/70. He has noted significant improvement in his left hand weakness but still has a little swallowing difficulties. His speech is also a lot better. He is been having a lot of nocturia and has not been sleeping well. He has seen a urologist a few weeks ago who gave him some samples of medication which seems to be helping. He plans to have left eye cataract surgery in a few weeks. He also wants to drive. Update 10/30/2015 : He returns for follow-up after last visit 6 months ago. He continues to do well without recurrent stroke or TIA symptoms. He states his blood pressures typically quite good but today it is slightly elevated 163/66 in office. His been compliant with his medicines. He remains on eliquis which is tolerating well without bleeding or bruising. He had recent lipid profile checked by his cardiologist and LDL  cholesterol was quite low and hence he was asked to reduce the dose of Crestor from 40 to 20 mg daily which is happy to do. He has a cataract in the left eye and is thinking about having surgery done and has questions about holding eliquis and stroke risk from that. Update 10/27/2016 : He returns for follow-up after last visit a year ago. He continues to do well and is stable from neurovascular standpoint without recurrent TIA or stroke symptoms. Remains on eliquis which is tolerating well without significant bleeding or bruising. States his blood pressure is well controlled. He is tolerating Crestor well without muscle aches and pains but cannot tell me when her last lipid profile was checked. He does see his cardiologist Dr. Wynonia Lawman regularly and is due to see him in February in the left follow-up carotid ultrasound done at that visit. Patient did have 2 episodes of flareup of gout and was treated by primary care physician with improvement in his symptoms. He has no new complaints today. ROS:   14 system review of systems is positive for   hearing loss, ringing in the ears,  drooling, walking difficulty and all other systems negative  PMH:  Past Medical History:  Diagnosis Date  . BPH (benign prostatic hypertrophy)   . 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 Chronic kidney disease stage 3   . Coronary artery disease    DR. TILLEY IS PT'S CARDIOLGIST  . GERD (gastroesophageal reflux disease)   . Gout   . Hyperlipidemia   .  Hypertensive heart disease   . Incontinence of urine   . Memory loss   . Myocardial infarction 1994  . Osteoarthritis   . Paroxysmal atrial fibrillation ([facility])   . Peripheral vascular disease ([place])   . Personal history of transient ischemic attack (TIA) and cerebral infarction without residual deficit   . Right pontine stroke ([place]) 02/27/2015  . Tooth disease     Social History:  Social History   Social History  . Marital status: Widowed      Spouse name: N/A  . Number of children: 3  . Years of education: 26   Occupational History  . retired     Regulatory affairs officer   Social History Main Topics  . Smoking status: Former Research scientist (life sciences)  . Smokeless tobacco: Never Used     Comment: QUIT SMOKING PRIOR TO 1980  . Alcohol use No  . Drug use: No  . Sexual activity: Not on file   Other Topics Concern  . 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 acetaminophen (TYLENOL) 500 MG tablet Take 1,000 mg by mouth every 6 (six) hours as needed for pain.    Marland Kitchen apixaban (ELIQUIS) 2.5 MG TABS tablet Take 1 tablet (2.5 mg total) by mouth 2 (two) times daily. 60 tablet   . atenolol (TENORMIN) 25 MG tablet Take 1 tablet (25 mg total) by mouth 2 (two) times daily. (Patient taking differently: Take 25 mg by mouth daily. )    . docusate sodium (COLACE) 100 MG capsule Take 100 mg by mouth daily.    . finasteride (PROSCAR) 5 MG tablet Take 5 mg by mouth daily.    . ILEVRO 0.3 % ophthalmic suspension     . nitroGLYCERIN (NITROSTAT) 0.4 MG SL tablet Place 0.4 mg under the tongue every 5 (five) minutes as needed for chest pain.    Marland Kitchen omeprazole (PRILOSEC) 20 MG capsule Take 20 mg by mouth daily.     No current facility-administered medications on file prior to visit.     Allergies:   Allergies  Allergen Reactions  . Lipitor [Atorvastatin] Other (See Comments)    Severe constipation    Physical Exam General: well developed, well nourished elderly Caucasian male, seated, in no evident distress Head: head normocephalic and atraumatic.  Neck: supple with no carotid or supraclavicular bruits Cardiovascular: regular rate and rhythm, no murmurs Musculoskeletal: no deformity Skin:  no rash/petichiae Vascular:  Normal pulses all  extremities Vitals:   10/27/16 1208  BP: (!) 178/64  Pulse: (!) 51   Neurologic Exam Mental Status: Awake and fully alert. Oriented to place and time. Recent and remote memory intact. Attention span, concentration and fund of knowledge appropriate. Mood and affect appropriate.  Cranial Nerves: Fundoscopic exam not done. . Pupils equal, briskly reactive to light. Extraocular movements full without nystagmus. Visual fields full to confrontation. Hearing diminished bilaterally.. Facial sensation intact. Face, tongue, palate moves normally and symmetrically.  Motor: Normal bulk and tone. Normal strength in all tested extremity muscles. Diminished fine finger movements on the left. Mild left grip weakness. Orbits right over left approximately. Sensory.: intact to touch ,pinprick .position and vibratory sensation.  Coordination: Rapid alternating movements normal in all extremities. Finger-to-nose and heel-to-shin performed accurately bilaterally. Gait  and Station: Arises from chair without difficulty. Stance is normal. Gait demonstrates normal stride length and balance . Able to heel, toe and tandem walk with mild difficulty.  Reflexes: 1+ and symmetric. Toes downgoing.       ASSESSMENT: 82 year male with  Right pontine infarct in April 2016 due to right vertebral artery stenosis/occlusion with h/o chronic atrial fibrillation     PLAN: I had a long d/w patient about his remote stroke, risk for recurrent stroke/TIAs, personally independently reviewed imaging studies and stroke evaluation results and answered questions.Continue eliquis for atrial fibrillation  for secondary stroke prevention and maintain strict control of hypertension with blood pressure goal below 130/90, diabetes with hemoglobin A1c goal below 6.5% and lipids with LDL cholesterol goal below 70 mg/dL. I also advised the patient to eat a healthy diet with plenty of whole grains, cereals, fruits and vegetables, exercise regularly and  maintain ideal body weight . Greater than 50% time during this 25 minute visit was spent on counseling and coordination of care about stroke and TIA risk, prevention and treatment. No routine followup in the future with me is necessary but he may be referred back as needed   Antony Contras, MD  Note: This document was prepared with digital dictation and possible smart phrase technology. Any transcriptional errors that result from this process are unintentional

## 2016-10-29 ENCOUNTER — Ambulatory Visit: Payer: Medicare Other | Admitting: Neurology

## 2017-02-11 ENCOUNTER — Other Ambulatory Visit: Payer: Self-pay | Admitting: Cardiology

## 2017-02-11 ENCOUNTER — Ambulatory Visit (HOSPITAL_COMMUNITY)
Admission: RE | Admit: 2017-02-11 | Discharge: 2017-02-11 | Disposition: A | Discharge status: Home / Self Care | Payer: Medicare Other | Source: Ambulatory Visit | Attending: Vascular Surgery | Admitting: Vascular Surgery

## 2017-02-11 DIAGNOSIS — Z8673 Personal history of transient ischemic attack (TIA), and cerebral infarction without residual deficits: Secondary | ICD-10-CM | POA: Diagnosis present

## 2017-02-11 DIAGNOSIS — I6523 Occlusion and stenosis of bilateral carotid arteries: Secondary | ICD-10-CM | POA: Insufficient documentation

## 2017-02-11 LAB — VAS US CAROTID
LCCADSYS: 94 cm/s
LCCAPDIAS: 8 cm/s
LEFT ECA DIAS: 0 cm/s
LICAPDIAS: -17 cm/s
LICAPSYS: -119 cm/s
Left CCA dist dias: 10 cm/s
Left CCA prox sys: 71 cm/s
Left ICA dist dias: -18 cm/s
Left ICA dist sys: -123 cm/s
RCCAPSYS: 64 cm/s
RIGHT CCA MID DIAS: 15 cm/s
RIGHT ECA DIAS: -1 cm/s
Right CCA prox dias: 8 cm/s

## 2017-07-31 ENCOUNTER — Emergency Department (HOSPITAL_COMMUNITY): Payer: Medicare Other

## 2017-07-31 ENCOUNTER — Inpatient Hospital Stay (HOSPITAL_COMMUNITY)
Admission: EM | Admit: 2017-07-31 | Discharge: 2017-08-28 | DRG: 233 | Disposition: A | Discharge status: Skilled Nursing Facility | Payer: Medicare Other | Attending: Cardiothoracic Surgery | Admitting: Cardiothoracic Surgery

## 2017-07-31 ENCOUNTER — Encounter (HOSPITAL_COMMUNITY): Payer: Self-pay

## 2017-07-31 DIAGNOSIS — I340 Nonrheumatic mitral (valve) insufficiency: Secondary | ICD-10-CM | POA: Diagnosis not present

## 2017-07-31 DIAGNOSIS — D696 Thrombocytopenia, unspecified: Secondary | ICD-10-CM | POA: Diagnosis not present

## 2017-07-31 DIAGNOSIS — Z87891 Personal history of nicotine dependence: Secondary | ICD-10-CM

## 2017-07-31 DIAGNOSIS — N183 Chronic kidney disease, stage 3 unspecified: Secondary | ICD-10-CM | POA: Diagnosis present

## 2017-07-31 DIAGNOSIS — R1114 Bilious vomiting: Secondary | ICD-10-CM

## 2017-07-31 DIAGNOSIS — D693 Immune thrombocytopenic purpura: Secondary | ICD-10-CM | POA: Diagnosis present

## 2017-07-31 DIAGNOSIS — Z79899 Other long term (current) drug therapy: Secondary | ICD-10-CM

## 2017-07-31 DIAGNOSIS — N170 Acute kidney failure with tubular necrosis: Secondary | ICD-10-CM | POA: Diagnosis not present

## 2017-07-31 DIAGNOSIS — E669 Obesity, unspecified: Secondary | ICD-10-CM | POA: Diagnosis present

## 2017-07-31 DIAGNOSIS — E1151 Type 2 diabetes mellitus with diabetic peripheral angiopathy without gangrene: Secondary | ICD-10-CM | POA: Diagnosis present

## 2017-07-31 DIAGNOSIS — N2581 Secondary hyperparathyroidism of renal origin: Secondary | ICD-10-CM | POA: Diagnosis present

## 2017-07-31 DIAGNOSIS — J9811 Atelectasis: Secondary | ICD-10-CM | POA: Diagnosis not present

## 2017-07-31 DIAGNOSIS — I480 Paroxysmal atrial fibrillation: Secondary | ICD-10-CM | POA: Diagnosis present

## 2017-07-31 DIAGNOSIS — D62 Acute posthemorrhagic anemia: Secondary | ICD-10-CM | POA: Diagnosis not present

## 2017-07-31 DIAGNOSIS — I25110 Atherosclerotic heart disease of native coronary artery with unstable angina pectoris: Secondary | ICD-10-CM | POA: Diagnosis present

## 2017-07-31 DIAGNOSIS — I2510 Atherosclerotic heart disease of native coronary artery without angina pectoris: Secondary | ICD-10-CM

## 2017-07-31 DIAGNOSIS — I509 Heart failure, unspecified: Secondary | ICD-10-CM

## 2017-07-31 DIAGNOSIS — R490 Dysphonia: Secondary | ICD-10-CM | POA: Diagnosis present

## 2017-07-31 DIAGNOSIS — D638 Anemia in other chronic diseases classified elsewhere: Secondary | ICD-10-CM | POA: Diagnosis present

## 2017-07-31 DIAGNOSIS — E1122 Type 2 diabetes mellitus with diabetic chronic kidney disease: Secondary | ICD-10-CM | POA: Diagnosis present

## 2017-07-31 DIAGNOSIS — K567 Ileus, unspecified: Secondary | ICD-10-CM | POA: Diagnosis not present

## 2017-07-31 DIAGNOSIS — M549 Dorsalgia, unspecified: Secondary | ICD-10-CM | POA: Diagnosis present

## 2017-07-31 DIAGNOSIS — Z01810 Encounter for preprocedural cardiovascular examination: Secondary | ICD-10-CM | POA: Diagnosis not present

## 2017-07-31 DIAGNOSIS — M1990 Unspecified osteoarthritis, unspecified site: Secondary | ICD-10-CM | POA: Diagnosis present

## 2017-07-31 DIAGNOSIS — N990 Postprocedural (acute) (chronic) kidney failure: Secondary | ICD-10-CM | POA: Diagnosis present

## 2017-07-31 DIAGNOSIS — Z419 Encounter for procedure for purposes other than remedying health state, unspecified: Secondary | ICD-10-CM

## 2017-07-31 DIAGNOSIS — R5381 Other malaise: Secondary | ICD-10-CM | POA: Diagnosis not present

## 2017-07-31 DIAGNOSIS — H905 Unspecified sensorineural hearing loss: Secondary | ICD-10-CM | POA: Diagnosis present

## 2017-07-31 DIAGNOSIS — N400 Enlarged prostate without lower urinary tract symptoms: Secondary | ICD-10-CM | POA: Diagnosis present

## 2017-07-31 DIAGNOSIS — K219 Gastro-esophageal reflux disease without esophagitis: Secondary | ICD-10-CM | POA: Diagnosis present

## 2017-07-31 DIAGNOSIS — I119 Hypertensive heart disease without heart failure: Secondary | ICD-10-CM | POA: Diagnosis present

## 2017-07-31 DIAGNOSIS — J690 Pneumonitis due to inhalation of food and vomit: Secondary | ICD-10-CM | POA: Diagnosis not present

## 2017-07-31 DIAGNOSIS — Z96652 Presence of left artificial knee joint: Secondary | ICD-10-CM | POA: Diagnosis present

## 2017-07-31 DIAGNOSIS — Z888 Allergy status to other drugs, medicaments and biological substances status: Secondary | ICD-10-CM

## 2017-07-31 DIAGNOSIS — R0682 Tachypnea, not elsewhere classified: Secondary | ICD-10-CM | POA: Diagnosis not present

## 2017-07-31 DIAGNOSIS — R1110 Vomiting, unspecified: Secondary | ICD-10-CM

## 2017-07-31 DIAGNOSIS — R413 Other amnesia: Secondary | ICD-10-CM | POA: Diagnosis present

## 2017-07-31 DIAGNOSIS — R32 Unspecified urinary incontinence: Secondary | ICD-10-CM | POA: Diagnosis present

## 2017-07-31 DIAGNOSIS — I110 Hypertensive heart disease with heart failure: Secondary | ICD-10-CM | POA: Diagnosis not present

## 2017-07-31 DIAGNOSIS — Z955 Presence of coronary angioplasty implant and graft: Secondary | ICD-10-CM

## 2017-07-31 DIAGNOSIS — I214 Non-ST elevation (NSTEMI) myocardial infarction: Secondary | ICD-10-CM | POA: Diagnosis present

## 2017-07-31 DIAGNOSIS — Z452 Encounter for adjustment and management of vascular access device: Secondary | ICD-10-CM

## 2017-07-31 DIAGNOSIS — Z7901 Long term (current) use of anticoagulants: Secondary | ICD-10-CM

## 2017-07-31 DIAGNOSIS — I5031 Acute diastolic (congestive) heart failure: Secondary | ICD-10-CM | POA: Diagnosis not present

## 2017-07-31 DIAGNOSIS — I5032 Chronic diastolic (congestive) heart failure: Secondary | ICD-10-CM | POA: Diagnosis present

## 2017-07-31 DIAGNOSIS — I69391 Dysphagia following cerebral infarction: Secondary | ICD-10-CM

## 2017-07-31 DIAGNOSIS — L8990 Pressure ulcer of unspecified site, unspecified stage: Secondary | ICD-10-CM | POA: Insufficient documentation

## 2017-07-31 DIAGNOSIS — R570 Cardiogenic shock: Secondary | ICD-10-CM | POA: Diagnosis not present

## 2017-07-31 DIAGNOSIS — J90 Pleural effusion, not elsewhere classified: Secondary | ICD-10-CM | POA: Diagnosis not present

## 2017-07-31 DIAGNOSIS — E785 Hyperlipidemia, unspecified: Secondary | ICD-10-CM | POA: Diagnosis present

## 2017-07-31 DIAGNOSIS — K089 Disorder of teeth and supporting structures, unspecified: Secondary | ICD-10-CM | POA: Diagnosis present

## 2017-07-31 DIAGNOSIS — Z8349 Family history of other endocrine, nutritional and metabolic diseases: Secondary | ICD-10-CM

## 2017-07-31 DIAGNOSIS — I252 Old myocardial infarction: Secondary | ICD-10-CM

## 2017-07-31 DIAGNOSIS — Z85820 Personal history of malignant melanoma of skin: Secondary | ICD-10-CM

## 2017-07-31 DIAGNOSIS — E872 Acidosis: Secondary | ICD-10-CM | POA: Diagnosis not present

## 2017-07-31 DIAGNOSIS — I2584 Coronary atherosclerosis due to calcified coronary lesion: Secondary | ICD-10-CM | POA: Diagnosis present

## 2017-07-31 DIAGNOSIS — Z8249 Family history of ischemic heart disease and other diseases of the circulatory system: Secondary | ICD-10-CM

## 2017-07-31 DIAGNOSIS — I130 Hypertensive heart and chronic kidney disease with heart failure and stage 1 through stage 4 chronic kidney disease, or unspecified chronic kidney disease: Secondary | ICD-10-CM | POA: Diagnosis present

## 2017-07-31 DIAGNOSIS — R630 Anorexia: Secondary | ICD-10-CM | POA: Diagnosis not present

## 2017-07-31 DIAGNOSIS — E876 Hypokalemia: Secondary | ICD-10-CM | POA: Diagnosis not present

## 2017-07-31 DIAGNOSIS — E871 Hypo-osmolality and hyponatremia: Secondary | ICD-10-CM | POA: Diagnosis not present

## 2017-07-31 DIAGNOSIS — R1310 Dysphagia, unspecified: Secondary | ICD-10-CM | POA: Diagnosis present

## 2017-07-31 DIAGNOSIS — N179 Acute kidney failure, unspecified: Secondary | ICD-10-CM

## 2017-07-31 DIAGNOSIS — Z951 Presence of aortocoronary bypass graft: Secondary | ICD-10-CM | POA: Diagnosis not present

## 2017-07-31 DIAGNOSIS — R34 Anuria and oliguria: Secondary | ICD-10-CM | POA: Diagnosis not present

## 2017-07-31 DIAGNOSIS — I129 Hypertensive chronic kidney disease with stage 1 through stage 4 chronic kidney disease, or unspecified chronic kidney disease: Secondary | ICD-10-CM | POA: Diagnosis not present

## 2017-07-31 DIAGNOSIS — R0981 Nasal congestion: Secondary | ICD-10-CM | POA: Diagnosis not present

## 2017-07-31 DIAGNOSIS — M109 Gout, unspecified: Secondary | ICD-10-CM | POA: Diagnosis present

## 2017-07-31 DIAGNOSIS — Z6830 Body mass index (BMI) 30.0-30.9, adult: Secondary | ICD-10-CM

## 2017-07-31 LAB — CBC
HCT: 48.4 % (ref 39.0–52.0)
Hemoglobin: 16.6 g/dL (ref 13.0–17.0)
MCH: 35 pg — AB (ref 26.0–34.0)
MCHC: 34.3 g/dL (ref 30.0–36.0)
MCV: 102.1 fL — AB (ref 78.0–100.0)
PLATELETS: 119 10*3/uL — AB (ref 150–400)
RBC: 4.74 MIL/uL (ref 4.22–5.81)
RDW: 14.7 % (ref 11.5–15.5)
WBC: 8.5 10*3/uL (ref 4.0–10.5)

## 2017-07-31 LAB — BASIC METABOLIC PANEL
Anion gap: 9 (ref 5–15)
BUN: 23 mg/dL — AB (ref 6–20)
CALCIUM: 9.8 mg/dL (ref 8.9–10.3)
CHLORIDE: 106 mmol/L (ref 101–111)
CO2: 24 mmol/L (ref 22–32)
CREATININE: 1.78 mg/dL — AB (ref 0.61–1.24)
GFR calc Af Amer: 39 mL/min — ABNORMAL LOW (ref >60)
GFR calc non Af Amer: 34 mL/min — ABNORMAL LOW (ref >60)
Glucose, Bld: 98 mg/dL (ref 65–99)
Potassium: 3.9 mmol/L (ref 3.5–5.1)
Sodium: 139 mmol/L (ref 135–145)

## 2017-07-31 LAB — I-STAT TROPONIN, ED
Troponin i, poc: 7.59 ng/mL (ref 0.00–0.08)
Troponin i, poc: 7.4 ng/mL (ref 0.00–0.08)

## 2017-07-31 LAB — BRAIN NATRIURETIC PEPTIDE: B NATRIURETIC PEPTIDE 5: 670.7 pg/mL — AB (ref 0.0–100.0)

## 2017-07-31 LAB — HEPARIN LEVEL (UNFRACTIONATED)

## 2017-07-31 LAB — PROTIME-INR
INR: 1.67
PROTHROMBIN TIME: 19.5 s — AB (ref 11.4–15.2)

## 2017-07-31 LAB — APTT

## 2017-07-31 MED ORDER — HEPARIN SODIUM (PORCINE) 5000 UNIT/ML IJ SOLN
4000.0000 [IU] | Freq: Once | INTRAMUSCULAR | Status: AC
  Administered 2017-07-31: 4000 [IU] via INTRAVENOUS
  Filled 2017-07-31: qty 1

## 2017-07-31 MED ORDER — HEPARIN (PORCINE) IN NACL 100-0.45 UNIT/ML-% IJ SOLN
800.0000 [IU]/h | INTRAMUSCULAR | Status: DC
  Administered 2017-08-01: 950 [IU]/h via INTRAVENOUS
  Administered 2017-08-01: 900 [IU]/h via INTRAVENOUS
  Administered 2017-08-03: 800 [IU]/h via INTRAVENOUS
  Filled 2017-07-31 (×3): qty 250

## 2017-07-31 MED ORDER — ASPIRIN 81 MG PO CHEW
324.0000 mg | CHEWABLE_TABLET | Freq: Once | ORAL | Status: AC
  Administered 2017-07-31: 324 mg via ORAL
  Filled 2017-07-31: qty 4

## 2017-07-31 NOTE — ED Notes (Signed)
Radiolucent pads placed on patient 

## 2017-07-31 NOTE — ED Notes (Signed)
Notified nurse 1st RN Raelene Bott troponin results.

## 2017-07-31 NOTE — ED Notes (Signed)
MD at bedside. 

## 2017-07-31 NOTE — H&P (Signed)
CARDIOLOGY INPATIENT HISTORY AND PHYSICAL EXAMINATION NOTE  Patient ID: Stanley Robles MRN: [id number], DOB/AGE: 1932/04/01   Admit date: 07/31/2017   Primary Physician: Glenda Chroman, MD Primary Cardiologist: Dr. Wynonia Lawman  Reason for admission: Chest pain  HPI: This is a 83 y.o. male with known h/o NSTEMI (known occulsion of RCA and dCx) last cath 06/2005, right pontine stroke (2016), sensorineural hearing loss, HLD, PVD, pAF, CKD stage 3, HTN, chronic diastolic heart failure presented with chest pain x 3 d.  Patient developed chest pain 3 d ago at rest, which was substernal in location, radiated to left arm, initial 8/10 and improved with NTG. It came back yesterday x 2 and he used NTG which improved the CP. He did not come to the ED because he did not want to disturb his family. He was overall feeling fatigued. CP was associated with diaphoresis. CP was not associated with SOB, n/v, diarrhea. Initial trop in the ED was 7. He was given aspirin and IV heparin and was admitted for possible cath on Monday.   Problem List: Past Medical History:  Diagnosis Date  . BPH (benign prostatic hypertrophy)   . Cancer ([place])    CANCEROUS MOLE REMOVED FROM BACK - SEVERAL YRS AGO  . Chronic kidney disease stage 3   . Coronary artery disease    DR. TILLEY IS PT'S CARDIOLGIST  . GERD (gastroesophageal reflux disease)   . Gout   . Hyperlipidemia   . Hypertensive heart disease   . Incontinence of urine   . Memory loss   . Myocardial infarction ([place]) 1994  . Osteoarthritis   . Paroxysmal atrial fibrillation (HCC)   . Peripheral vascular disease ([place])   . Personal history of transient ischemic attack (TIA) and cerebral infarction without residual deficit   . Right pontine stroke ([place]) 02/27/2015  . Tooth disease     Past Surgical History:  Procedure Laterality Date  . CARDIAC CATHETERIZATION  07/21/2005  . HERNIA REPAIR  12/26/2002   BILATERAL INGUINAL HERNIA REPAIR  . MASTOID SURGERY X 5    .  PROSTATE SURGERY    . TOTAL KNEE ARTHROPLASTY Left 03/28/2013   Procedure: LEFT TOTAL KNEE ARTHROPLASTY;  Surgeon: Gearlean Alf, MD;  Location: [facility];  Service: Orthopedics;  Laterality: Left;     Allergies:  Allergies  Allergen Reactions  . Lipitor [Atorvastatin] Other (See Comments)    Severe constipation     Home Medications Current Facility-Administered Medications  Medication Dose Route Frequency Provider Last Rate Last Dose  . [START ON 08/01/2017] heparin ADULT infusion 100 units/mL (25000 units/253mL sodium chloride 0.45%)  950 Units/hr Intravenous Continuous Long, Wonda Olds, MD       Current Outpatient Prescriptions  Medication Sig Dispense Refill  . acetaminophen (TYLENOL) 500 MG tablet Take 1,000 mg by mouth every 6 (six) hours as needed for pain.    Marland Kitchen allopurinol (ZYLOPRIM) 300 MG tablet 300 mg daily.    Marland Kitchen apixaban (ELIQUIS) 2.5 MG TABS tablet Take 1 tablet (2.5 mg total) by mouth 2 (two) times daily. 60 tablet   . atenolol (TENORMIN) 25 MG tablet Take 1 tablet (25 mg total) by mouth 2 (two) times daily. (Patient taking differently: Take 25 mg by mouth daily. )    . Cholecalciferol (VITAMIN D3) 2000 units TABS Take 2,000 Units by mouth daily.    Marland Kitchen docusate sodium (COLACE) 100 MG capsule Take 100 mg by mouth daily.    . finasteride (PROSCAR) 5 MG tablet Take 5 mg  by mouth daily.    Marland Kitchen lisinopril (PRINIVIL,ZESTRIL) 20 MG tablet 20 mg daily.    Marland Kitchen MYRBETRIQ 50 MG TB24 tablet Take 50 mg by mouth daily.    . nitroGLYCERIN (NITROSTAT) 0.4 MG SL tablet Place 0.4 mg under the tongue every 5 (five) minutes as needed for chest pain.    Marland Kitchen omeprazole (PRILOSEC) 20 MG capsule Take 20 mg by mouth daily.    . rosuvastatin (CRESTOR) 40 MG tablet 40 mg daily.       Family History  Problem Relation Age of Onset  . Hypertension Father   . Hypertension Mother   . Hyperlipidemia Mother   . Cancer Brother      Social History   Social History  . Marital status: Widowed    Spouse  name: N/A  . Number of children: 3  . Years of education: 44   Occupational History  . retired     Regulatory affairs officer   Social History Main Topics  . Smoking status: Former Research scientist (life sciences)  . Smokeless tobacco: Never Used     Comment: QUIT SMOKING PRIOR TO 1980  . Alcohol use No  . Drug use: No  . Sexual activity: Not on file   Other Topics Concern  . Not on file   Social History Narrative   Retired Academic librarian and body.  Widower. Has sig other   Right handed   Caffeine use - coffee 1 cup daily     Review of Systems: General: negative for chills, fever, night sweats or weight changes.  Cardiovascular: chest pain, dyspnea negative for dyspnea on exertion, edema, orthopnea, palpitations, paroxysmal nocturnal dyspnea or shortness of breath  Dermatological: negative for rash Respiratory: negative for cough or wheezing Urologic: negative for hematuria Abdominal: negative for nausea, vomiting, diarrhea, bright red blood per rectum, melena, or hematemesis Neurologic: negative for visual changes, syncope, or dizziness Endocrine: no diabetes, no hypothyroidism Immunological: no lymph adenopathy Psych: non homicidal/suicidal  Physical Exam: Vitals: BP 125/63   Pulse 66   Temp 98.3 F (36.8 C) (Oral)   Resp (!) 22   Ht 5\' 7"  (1.702 m)   Wt 79.4 kg (175 lb)   SpO2 94%   BMI 27.41 kg/m  General: not in acute distress Neck: JVP flat, neck supple Heart: regular rate and rhythm, S1, S2, no murmurs  Lungs: CTAB  GI: non tender, non distended, bowel sounds present Extremities: no edema Neuro: AAO x 3  Psych: normal affect, no anxiety   Labs:   Results for orders placed or performed during the hospital encounter of 07/31/17 (from the past 24 hour(s))  Basic metabolic panel     Status: Abnormal   Collection Time: 07/31/17  6:47 PM  Result Value Ref Range   Sodium 139 135 - 145 mmol/L   Potassium 3.9 3.5 - 5.1 mmol/L   Chloride 106 101 - 111 mmol/L   CO2 24 22 - 32 mmol/L   Glucose,  Bld 98 65 - 99 mg/dL   BUN 23 (H) 6 - 20 mg/dL   Creatinine, Ser 1.78 (H) 0.61 - 1.24 mg/dL   Calcium 9.8 8.9 - 10.3 mg/dL   GFR calc non Af Amer 34 (L) >60 mL/min   GFR calc Af Amer 39 (L) >60 mL/min   Anion gap 9 5 - 15  CBC     Status: Abnormal   Collection Time: 07/31/17  6:47 PM  Result Value Ref Range   WBC 8.5 4.0 - 10.5 K/uL   RBC  4.74 4.22 - 5.81 MIL/uL   Hemoglobin 16.6 13.0 - 17.0 g/dL   HCT 48.4 39.0 - 52.0 %   MCV 102.1 (H) 78.0 - 100.0 fL   MCH 35.0 (H) 26.0 - 34.0 pg   MCHC 34.3 30.0 - 36.0 g/dL   RDW 14.7 11.5 - 15.5 %   Platelets 119 (L) 150 - 400 K/uL  I-stat troponin, ED     Status: Abnormal   Collection Time: 07/31/17  7:07 PM  Result Value Ref Range   Troponin i, poc 7.59 (HH) 0.00 - 0.08 ng/mL   Comment NOTIFIED PHYSICIAN    Comment 3          I-Stat Troponin, ED (not at [facility])     Status: Abnormal   Collection Time: 07/31/17  8:10 PM  Result Value Ref Range   Troponin i, poc 7.40 (HH) 0.00 - 0.08 ng/mL   Comment NOTIFIED PHYSICIAN    Comment 3          Brain natriuretic peptide     Status: Abnormal   Collection Time: 07/31/17  8:19 PM  Result Value Ref Range   B Natriuretic Peptide 670.7 (H) 0.0 - 100.0 pg/mL  APTT     Status: Abnormal   Collection Time: 07/31/17  8:19 PM  Result Value Ref Range   aPTT >200 (HH) 24 - 36 seconds  Heparin level (unfractionated)     Status: Abnormal   Collection Time: 07/31/17  8:19 PM  Result Value Ref Range   Heparin Unfractionated >2.20 (H) 0.30 - 0.70 IU/mL  Protime-INR     Status: Abnormal   Collection Time: 07/31/17  8:19 PM  Result Value Ref Range   Prothrombin Time 19.5 (H) 11.4 - 15.2 seconds   INR 1.67      Radiology/Studies: Dg Chest 2 View  Result Date: 07/31/2017 CLINICAL DATA:  Left-sided chest pain EXAM: CHEST  2 VIEW COMPARISON:  03/05/2015 FINDINGS: Hyperinflation. No focal infiltrate or effusion. Borderline to mild cardiomegaly. Aortic atherosclerosis. Moderate hiatal hernia. No pneumothorax.  Degenerative changes of the spine IMPRESSION: 1. No acute infiltrate or edema 2. Moderate hiatal hernia suspected 3. Borderline to mild cardiomegaly Electronically Signed   By: Donavan Foil M.D.   On: 07/31/2017 19:49    EKG: normal sinus rhythm, diffuse ST depressions with St elevation in AVR  Echo: hyperdynamic LV EF 70%, mild RA and LA dilatation, normal PA pressure  Cardiology Procedures-Invasive:  cardiac cath (left) August 2006;  Cardiology Procedures-Noninvasive:  treadmill cardiolite September 1999 Cardiac Cath Results:  normal Left main, diffuse luminal irregularities LAD, 40% stenosis mid CFX, occluded PLR, small and nondominant RCA, occluded RCA with faint collaterals;   LVEF of 65% documented via cardiac cath on 05/19/2005   CHADS Score:  4  CHA2DS2-VASC Score:  5  Medical decision making:  Discussed care with the patient Discussed care with the physician on the phone Reviewed labs and imaging personally Reviewed prior records  ASSESSMENT AND PLAN:  This is a 83 y.o. male with known h/o NSTEMI (known occulsion of RCA and dCx) last cath 06/2005, right pontine stroke (2016), sensorineural hearing loss, HLD, PVD, pAF, CKD stage 3, HTN, chronic diastolic heart failure presented with chest pain x 3 d, elevated troponin and ST depression on ECG.   Active Problems:   CAD (coronary artery disease)   Paroxysmal atrial fibrillation (HCC)   Hyperlipidemia   Hypertensive heart disease   Chronic kidney disease stage 3   NSTEMI (non-ST elevated myocardial infarction) (  HCC)  NSTEMI Cycle troponin, serial EKGs prn, lipid panel, TSH, HbA1c, BNP, echocardiogram in the AM, holding apixaban IV heparin, aspirin, high dose statin (crestor 40 mg daily), Consider cardiac catheterization early during the day  Paroxysmal atrial fibrillation, currently in sinus rhythm Prior history of CHF, hypertension, age >12, CAD ([place] = 5) Maintained on apixaban for anticoagulation, LA size is mildly  dilated by last echo Holding apixaban for cath, on IV heparin  Hypertension, essential Continue home blood pressure meds Goal <140/90  Gout, on allopurinol  CKD stage 3b (GFR 34) - contrast sparing procedure, discuss risks of renal failure with the patient, hydration pre/post procedure  Known obstructive coronary artery disease - prior known CTO by cath in 06/2005.  - continue aspirin, Crestor, atenolol, echo in AM  Signed, Flossie Dibble, MD MS 07/31/2017, 10:40 PM

## 2017-07-31 NOTE — ED Notes (Addendum)
Date and time results received: 07/31/17 1920   Test: I-stat Troponin Critical Value:7.59  Name of Provider Notified: Dr. Laverta Baltimore  Orders Received? No Or Actions Taken?: Notified Dr. Laverta Baltimore notified that pt is being moved from Waiting room to D35.

## 2017-07-31 NOTE — ED Triage Notes (Signed)
Pt from home endorsing left sided chest pain with radiation to the left shoulder x 2 days. Pt took 3 nitros over last 2 days with relief. Pt has hx of 2 MI's and cva. Pt is very hard of hearing. Hypertensive in triage.

## 2017-07-31 NOTE — ED Notes (Signed)
No chest pain

## 2017-07-31 NOTE — ED Notes (Signed)
[facility] contacted to activate Code Stemi 

## 2017-07-31 NOTE — ED Notes (Signed)
The pt s stemi has been cancelled   zoll pads removed

## 2017-07-31 NOTE — ED Notes (Signed)
Cardiologist at the bedside

## 2017-07-31 NOTE — ED Notes (Signed)
Pt undressed  Nasal 02 at 2

## 2017-07-31 NOTE — Progress Notes (Signed)
ANTICOAGULATION CONSULT NOTE - Initial Consult  Pharmacy Consult for heparin Indication: chest pain/ACS  Allergies  Allergen Reactions  . Lipitor [Atorvastatin] Other (See Comments)    Severe constipation    Patient Measurements: Height: 5\' 7"  (170.2 cm) Weight: 175 lb (79.4 kg) IBW/kg (Calculated) : 66.1   Vital Signs: Temp: 98.3 F (36.8 C) (09/07 1840) Temp Source: Oral (09/07 1840) BP: 159/90 (09/07 1945) Pulse Rate: 71 (09/07 1840)  Labs:  Recent Labs  07/31/17 1847  HGB 16.6  HCT 48.4  PLT 119*  CREATININE 1.78*    Estimated Creatinine Clearance: 32.3 mL/min (A) (by C-G formula based on SCr of 1.78 mg/dL (H)).   Medical History: Past Medical History:  Diagnosis Date  . BPH (benign prostatic hypertrophy)   . Cancer ([place])    CANCEROUS MOLE REMOVED FROM BACK - SEVERAL YRS AGO  . Chronic kidney disease stage 3   . Coronary artery disease    DR. TILLEY IS PT'S CARDIOLGIST  . GERD (gastroesophageal reflux disease)   . Gout   . Hyperlipidemia   . Hypertensive heart disease   . Incontinence of urine   . Memory loss   . Myocardial infarction ([place]) 1994  . Osteoarthritis   . Paroxysmal atrial fibrillation (HCC)   . Peripheral vascular disease ([place])   . Personal history of transient ischemic attack (TIA) and cerebral infarction without residual deficit   . Right pontine stroke ([place]) 02/27/2015  . Tooth disease      Assessment: 83 yo male on apixaban prior to arrival for hx of afib admitted with CP, initial troponin 7.59 and code stemi activated. Pt subsequently received heparin 4000 unit bolus in ED. Per famliy pt last took apixaban dose at ~ 18:00 on 9/7.    Goal of Therapy:  Heparin level 0.3-0.7 units/ml aPTT 66-102 seconds Monitor platelets by anticoagulation protocol: Yes   Plan:  1. Begin heparin infusion at 950 units/hr on 09/08 at 0600 (12 hours after previous apixaban dose received) 2. Obtain aPTT 8 hours after starting heparin infusion   3. Daily aPTT and heparin level until lab values correlate   Vincenza Hews, PharmD, BCPS 07/31/2017, 8:38 PM

## 2017-07-31 NOTE — ED Provider Notes (Signed)
[facility] DEPT Provider Note   CSN: [id number] Arrival date & time: 07/31/17  1833     History   Chief Complaint Chief Complaint  Patient presents with  . Chest Pain    HPI Stanley Robles is a 83 y.o. male.  This is a 83 year old male with PMH of CAD status post stent placement, HTN, HLD, prior MI, PAD, PVD, TIA who presents with chest pain for the past 3 days that is worsening along with shortness of breath.  Patient denies cough or fevers.  His chest pain is in the left side of his chest, does not radiate.  Denies any vision changes or headaches are numbness or tingling in his extremities.   The history is provided by the patient and a relative. The history is limited by the condition of the patient.    Past Medical History:  Diagnosis Date  . BPH (benign prostatic hypertrophy)   . Cancer ([place])    CANCEROUS MOLE REMOVED FROM BACK - SEVERAL YRS AGO  . Chronic kidney disease stage 3   . Coronary artery disease    DR. TILLEY IS PT'S CARDIOLGIST  . GERD (gastroesophageal reflux disease)   . Gout   . Hyperlipidemia   . Hypertensive heart disease   . Incontinence of urine   . Memory loss   . Myocardial infarction ([place]) 1994  . Osteoarthritis   . Paroxysmal atrial fibrillation (HCC)   . Peripheral vascular disease ([place])   . Personal history of transient ischemic attack (TIA) and cerebral infarction without residual deficit   . Right pontine stroke ([place]) 02/27/2015  . Tooth disease     Patient Active Problem List   Diagnosis Date Noted  . CVA (cerebral infarction) 02/27/2015  . Left-sided weakness 02/27/2015  . Right pontine stroke ([place]) 02/27/2015  . Dysphagia due to recent cerebral infarction 02/27/2015  . BPH (benign prostatic hyperplasia) 04/03/2013  . CAD (coronary artery disease) 04/01/2013  . Paroxysmal atrial fibrillation ([place]) 04/01/2013  . Hyperlipidemia   . Hypertensive heart disease   . Personal history of transient ischemic attack (TIA) and cerebral  infarction without residual deficit   . Long-term (current) use of anticoagulants   . Chronic kidney disease stage 3     Past Surgical History:  Procedure Laterality Date  . CARDIAC CATHETERIZATION  07/21/2005  . HERNIA REPAIR  12/26/2002   BILATERAL INGUINAL HERNIA REPAIR  . MASTOID SURGERY X 5    . PROSTATE SURGERY    . TOTAL KNEE ARTHROPLASTY Left 03/28/2013   Procedure: LEFT TOTAL KNEE ARTHROPLASTY;  Surgeon: Gearlean Alf, MD;  Location: [facility];  Service: Orthopedics;  Laterality: Left;       Home Medications    Prior to Admission medications   Medication Sig Start Date End Date Taking? Authorizing Provider  acetaminophen (TYLENOL) 500 MG tablet Take 1,000 mg by mouth every 6 (six) hours as needed for pain.   Yes [provider]  allopurinol (ZYLOPRIM) 300 MG tablet 300 mg daily. 10/17/16  Yes [provider]  apixaban (ELIQUIS) 2.5 MG TABS tablet Take 1 tablet (2.5 mg total) by mouth 2 (two) times daily. 03/02/15  Yes Thurnell Lose, MD  atenolol (TENORMIN) 25 MG tablet Take 1 tablet (25 mg total) by mouth 2 (two) times daily. Patient taking differently: Take 25 mg by mouth daily.  04/04/13  Yes Bonnielee Haff, MD  Cholecalciferol (VITAMIN D3) 2000 units TABS Take 2,000 Units by mouth daily.   Yes [provider]  docusate  sodium (COLACE) 100 MG capsule Take 100 mg by mouth daily.   Yes [provider]  finasteride (PROSCAR) 5 MG tablet Take 5 mg by mouth daily.   Yes [provider]  lisinopril (PRINIVIL,ZESTRIL) 20 MG tablet 20 mg daily. 11/13/15  Yes [provider]  MYRBETRIQ 50 MG TB24 tablet Take 50 mg by mouth daily. 07/30/17  Yes [provider]  nitroGLYCERIN (NITROSTAT) 0.4 MG SL tablet Place 0.4 mg under the tongue every 5 (five) minutes as needed for chest pain.   Yes [provider]  omeprazole (PRILOSEC) 20 MG capsule Take 20 mg by mouth daily.   Yes [provider]  rosuvastatin  (CRESTOR) 40 MG tablet 40 mg daily. 08/21/16  Yes [provider]    Family History Family History  Problem Relation Age of Onset  . Hypertension Father   . Hypertension Mother   . Hyperlipidemia Mother   . Cancer Brother     Social History Social History  Substance Use Topics  . Smoking status: Former Research scientist (life sciences)  . Smokeless tobacco: Never Used     Comment: QUIT SMOKING PRIOR TO 1980  . Alcohol use No     Allergies   Lipitor [atorvastatin]   Review of Systems Review of Systems  Constitutional: Negative for chills and fever.  HENT: Negative for ear pain and sore throat.   Eyes: Negative for pain and visual disturbance.  Respiratory: Positive for shortness of breath. Negative for cough, chest tightness and wheezing.   Cardiovascular: Positive for chest pain. Negative for palpitations and leg swelling.  Gastrointestinal: Negative for abdominal pain and vomiting.  Genitourinary: Negative for dysuria and hematuria.  Musculoskeletal: Negative for arthralgias and back pain.  Skin: Negative for color change and rash.  Neurological: Negative for seizures and syncope.  All other systems reviewed and are negative.    Physical Exam Updated Vital Signs BP 125/63   Pulse 66   Temp 98.3 F (36.8 C) (Oral)   Resp (!) 22   Ht 5\' 7"  (1.702 m)   Wt 79.4 kg (175 lb)   SpO2 94%   BMI 27.41 kg/m   Physical Exam  Constitutional: He appears well-developed and well-nourished.  HENT:  Head: Normocephalic and atraumatic.  Eyes: Conjunctivae are normal.  Neck: Neck supple.  Cardiovascular: Normal rate, regular rhythm, normal heart sounds and intact distal pulses.   No murmur heard. Pulmonary/Chest: Effort normal and breath sounds normal. No tachypnea. No respiratory distress. He exhibits no tenderness.  Abdominal: Soft. There is no tenderness.  Musculoskeletal: He exhibits no edema.  Neurological: He is alert.  Skin: Skin is warm and dry.  Psychiatric: He has a normal  mood and affect.  Nursing note and vitals reviewed.    ED Treatments / Results  Labs (all labs ordered are listed, but only abnormal results are displayed) Labs Reviewed  BASIC METABOLIC PANEL - Abnormal; Notable for the following:       Result Value   BUN 23 (*)    Creatinine, Ser 1.78 (*)    GFR calc non Af Amer 34 (*)    GFR calc Af Amer 39 (*)    All other components within normal limits  CBC - Abnormal; Notable for the following:    MCV 102.1 (*)    MCH 35.0 (*)    Platelets 119 (*)    All other components within normal limits  BRAIN NATRIURETIC PEPTIDE - Abnormal; Notable for the following:    B Natriuretic Peptide 670.7 (*)  All other components within normal limits  APTT - Abnormal; Notable for the following:    aPTT >200 (*)    All other components within normal limits  HEPARIN LEVEL (UNFRACTIONATED) - Abnormal; Notable for the following:    Heparin Unfractionated >2.20 (*)    All other components within normal limits  PROTIME-INR - Abnormal; Notable for the following:    Prothrombin Time 19.5 (*)    All other components within normal limits  I-STAT TROPONIN, ED - Abnormal; Notable for the following:    Troponin i, poc 7.59 (*)    All other components within normal limits  I-STAT TROPONIN, ED - Abnormal; Notable for the following:    Troponin i, poc 7.40 (*)    All other components within normal limits    EKG  EKG Interpretation  Date/Time:  Friday July 31 2017 18:38:37 EDT Ventricular Rate:  72 PR Interval:  158 QRS Duration: 80 QT Interval:  440 QTC Calculation: 481 R Axis:   -7 Text Interpretation:  Normal sinus rhythm Marked ST abnormality, possible inferior subendocardial injury Prolonged QT Abnormal ECG Ischemic EKG. No STEMI.  Confirmed by Nanda Quinton [phone]) on 07/31/2017 7:25:24 PM       Radiology Dg Chest 2 View  Result Date: 07/31/2017 CLINICAL DATA:  Left-sided chest pain EXAM: CHEST  2 VIEW COMPARISON:  03/05/2015 FINDINGS:  Hyperinflation. No focal infiltrate or effusion. Borderline to mild cardiomegaly. Aortic atherosclerosis. Moderate hiatal hernia. No pneumothorax. Degenerative changes of the spine IMPRESSION: 1. No acute infiltrate or edema 2. Moderate hiatal hernia suspected 3. Borderline to mild cardiomegaly Electronically Signed   By: Donavan Foil M.D.   On: 07/31/2017 19:49    Procedures Procedures (including critical care time)  Medications Ordered in ED Medications  heparin ADULT infusion 100 units/mL (25000 units/262mL sodium chloride 0.45%) (not administered)  aspirin chewable tablet 324 mg (324 mg Oral Given 07/31/17 2005)  heparin injection 4,000 Units (4,000 Units Intravenous Given 07/31/17 2006)     Initial Impression / Assessment and Plan / ED Course  I have reviewed the triage vital signs and the nursing notes.  Pertinent labs & imaging results that were available during my care of the patient were reviewed by me and considered in my medical decision making (see chart for details).     This is a 83 year old male with PMH of CAD status post stent placement, HTN, HLD, prior MI, PAD, PVD, TIA who presents with chest pain for the past 3 days that is worsening along with shortness of breath.  EKG reviewed and compared to prior. New ST depressions present in leads II, aVF, V2 through V6. Patient's initial i-stat troponin 7.59. Electrolytes within normal range.  Magnesium level ordered, repeat troponin and BNP ordered. CXR shows no evidence of acute cardiopulmonary abnormality.  Posterior EKG shows elevation in V6.  Repeat anterior EKG shows worsening T-wave inversion in leads III, aVF along with worsening ST depression of aforementioned leads.  Chewable aspirin tablet ordered.  Heparin bolus ordered. Cardiology STEMI team consulted, do not believe patient meets STEMI criteria at this time.  Cardiology inpatient team consulted for inpatient admission and management of likely NSTEMI.  Agreed  on admission. All questions answered at bedside.  Final Clinical Impressions(s) / ED Diagnoses   Final diagnoses:  NSTEMI (non-ST elevated myocardial infarction) [facility])    New Prescriptions New Prescriptions   No medications on file     Aldona Lento, MD 07/31/17 2248    Margette Fast, MD 08/01/17  0034  

## 2017-08-01 LAB — COMPREHENSIVE METABOLIC PANEL
ALK PHOS: 66 U/L (ref 38–126)
ALT: 19 U/L (ref 17–63)
ANION GAP: 11 (ref 5–15)
AST: 60 U/L — ABNORMAL HIGH (ref 15–41)
Albumin: 2.9 g/dL — ABNORMAL LOW (ref 3.5–5.0)
BILIRUBIN TOTAL: 0.9 mg/dL (ref 0.3–1.2)
BUN: 24 mg/dL — ABNORMAL HIGH (ref 6–20)
CHLORIDE: 107 mmol/L (ref 101–111)
CO2: 18 mmol/L — ABNORMAL LOW (ref 22–32)
Calcium: 9.1 mg/dL (ref 8.9–10.3)
Creatinine, Ser: 1.6 mg/dL — ABNORMAL HIGH (ref 0.61–1.24)
GFR calc Af Amer: 45 mL/min — ABNORMAL LOW (ref >60)
GFR calc non Af Amer: 38 mL/min — ABNORMAL LOW (ref >60)
GLUCOSE: 89 mg/dL (ref 65–99)
POTASSIUM: 3.6 mmol/L (ref 3.5–5.1)
SODIUM: 136 mmol/L (ref 135–145)
TOTAL PROTEIN: 5.6 g/dL — AB (ref 6.5–8.1)

## 2017-08-01 LAB — PROTIME-INR
INR: 1.35
Prothrombin Time: 16.5 seconds — ABNORMAL HIGH (ref 11.4–15.2)

## 2017-08-01 LAB — TROPONIN I
TROPONIN I: 10.85 ng/mL — AB (ref <0.03)
TROPONIN I: 11.9 ng/mL — AB (ref <0.03)
Troponin I: 9.78 ng/mL (ref <0.03)

## 2017-08-01 LAB — BRAIN NATRIURETIC PEPTIDE: B NATRIURETIC PEPTIDE 5: 560.8 pg/mL — AB (ref 0.0–100.0)

## 2017-08-01 LAB — CBC
HEMATOCRIT: 44 % (ref 39.0–52.0)
HEMOGLOBIN: 15 g/dL (ref 13.0–17.0)
MCH: 34.6 pg — ABNORMAL HIGH (ref 26.0–34.0)
MCHC: 34.1 g/dL (ref 30.0–36.0)
MCV: 101.4 fL — ABNORMAL HIGH (ref 78.0–100.0)
Platelets: 99 10*3/uL — ABNORMAL LOW (ref 150–400)
RBC: 4.34 MIL/uL (ref 4.22–5.81)
RDW: 14.5 % (ref 11.5–15.5)
WBC: 6 10*3/uL (ref 4.0–10.5)

## 2017-08-01 LAB — HEMOGLOBIN A1C
Hgb A1c MFr Bld: 5.2 % (ref 4.8–5.6)
Mean Plasma Glucose: 102.54 mg/dL

## 2017-08-01 LAB — TSH: TSH: 3.788 u[IU]/mL (ref 0.350–4.500)

## 2017-08-01 LAB — APTT: APTT: 106 s — AB (ref 24–36)

## 2017-08-01 MED ORDER — MIRABEGRON ER 50 MG PO TB24
50.0000 mg | ORAL_TABLET | Freq: Every day | ORAL | Status: DC
  Administered 2017-08-01 – 2017-08-06 (×6): 50 mg via ORAL
  Filled 2017-08-01 (×7): qty 1

## 2017-08-01 MED ORDER — DOCUSATE SODIUM 100 MG PO CAPS
100.0000 mg | ORAL_CAPSULE | Freq: Every day | ORAL | Status: DC
  Administered 2017-08-01 – 2017-08-06 (×6): 100 mg via ORAL
  Filled 2017-08-01 (×6): qty 1

## 2017-08-01 MED ORDER — LISINOPRIL 20 MG PO TABS
20.0000 mg | ORAL_TABLET | Freq: Every day | ORAL | Status: DC
  Administered 2017-08-01 – 2017-08-06 (×6): 20 mg via ORAL
  Filled 2017-08-01 (×6): qty 1

## 2017-08-01 MED ORDER — ACETAMINOPHEN 325 MG PO TABS
650.0000 mg | ORAL_TABLET | Freq: Four times a day (QID) | ORAL | Status: DC | PRN
  Administered 2017-08-01: 650 mg via ORAL
  Filled 2017-08-01: qty 2

## 2017-08-01 MED ORDER — VITAMIN D 1000 UNITS PO TABS
2000.0000 [IU] | ORAL_TABLET | Freq: Every day | ORAL | Status: DC
  Administered 2017-08-01 – 2017-08-06 (×6): 2000 [IU] via ORAL
  Filled 2017-08-01 (×6): qty 2

## 2017-08-01 MED ORDER — ROSUVASTATIN CALCIUM 20 MG PO TABS
40.0000 mg | ORAL_TABLET | Freq: Every day | ORAL | Status: DC
  Administered 2017-08-01 – 2017-08-06 (×5): 40 mg via ORAL
  Filled 2017-08-01 (×5): qty 4

## 2017-08-01 MED ORDER — ALLOPURINOL 300 MG PO TABS
300.0000 mg | ORAL_TABLET | Freq: Every day | ORAL | Status: DC
  Administered 2017-08-01 – 2017-08-06 (×6): 300 mg via ORAL
  Filled 2017-08-01 (×6): qty 1

## 2017-08-01 MED ORDER — FINASTERIDE 5 MG PO TABS
5.0000 mg | ORAL_TABLET | Freq: Every day | ORAL | Status: DC
  Administered 2017-08-01 – 2017-08-06 (×6): 5 mg via ORAL
  Filled 2017-08-01 (×6): qty 1

## 2017-08-01 MED ORDER — ATENOLOL 25 MG PO TABS
25.0000 mg | ORAL_TABLET | Freq: Two times a day (BID) | ORAL | Status: DC
  Administered 2017-08-01 – 2017-08-05 (×5): 25 mg via ORAL
  Filled 2017-08-01 (×6): qty 1

## 2017-08-01 MED ORDER — ASPIRIN EC 81 MG PO TBEC
81.0000 mg | DELAYED_RELEASE_TABLET | Freq: Every day | ORAL | Status: DC
  Administered 2017-08-01 – 2017-08-06 (×6): 81 mg via ORAL
  Filled 2017-08-01 (×6): qty 1

## 2017-08-01 MED ORDER — PANTOPRAZOLE SODIUM 40 MG PO TBEC
40.0000 mg | DELAYED_RELEASE_TABLET | Freq: Every day | ORAL | Status: DC
  Administered 2017-08-01 – 2017-08-06 (×6): 40 mg via ORAL
  Filled 2017-08-01 (×8): qty 1

## 2017-08-01 MED ORDER — NITROGLYCERIN 0.4 MG SL SUBL: 0.4000 mg | SUBLINGUAL_TABLET | SUBLINGUAL | Status: DC | PRN

## 2017-08-01 NOTE — ED Notes (Signed)
Report given to rn on 3  w 

## 2017-08-01 NOTE — Progress Notes (Signed)
CRITICAL VALUE ALERT  Critical Value:  Troponin 10.85  Date & Time Notied:  08/01/17 0230  Provider Notified: Dr. Eula Fried  Orders Received/Actions taken: No new orders at this time.

## 2017-08-01 NOTE — Progress Notes (Signed)
ANTICOAGULATION CONSULT NOTE - Followup Consult  Pharmacy Consult for heparin Indication: chest pain/ACS  Allergies  Allergen Reactions  . Lipitor [Atorvastatin] Other (See Comments)    Severe constipation    Patient Measurements: Height: 5\' 7"  (170.2 cm) Weight: 168 lb 14.4 oz (76.6 kg) IBW/kg (Calculated) : 66.1   Vital Signs: Temp: 97.5 F (36.4 C) (09/08 0812) Temp Source: Oral (09/08 0812) BP: 125/55 (09/08 0812) Pulse Rate: 73 (09/08 1000)  Labs:  Recent Labs  07/31/17 1847 07/31/17 2019 08/01/17 0057 08/01/17 0609 08/01/17 1215  HGB 16.6  --   --  15.0  --   HCT 48.4  --   --  44.0  --   PLT 119*  --   --  99*  --   APTT  --  >200*  --   --  106*  LABPROT  --  19.5*  --  16.5*  --   INR  --  1.67  --  1.35  --   HEPARINUNFRC  --  >2.20*  --   --   --   CREATININE 1.78*  --   --  1.60*  --   TROPONINI  --   --  10.85* 11.90* 9.78*    Estimated Creatinine Clearance: 29.9 mL/min (A) (by C-G formula based on SCr of 1.78 mg/dL (H)).   Medical History: Past Medical History:  Diagnosis Date  . BPH (benign prostatic hypertrophy)   . Cancer ([place])    CANCEROUS MOLE REMOVED FROM BACK - SEVERAL YRS AGO  . Chronic kidney disease stage 3   . Coronary artery disease    DR. TILLEY IS PT'S CARDIOLGIST  . GERD (gastroesophageal reflux disease)   . Gout   . Hyperlipidemia   . Hypertensive heart disease   . Incontinence of urine   . Memory loss   . Myocardial infarction ([place]) 1994  . Osteoarthritis   . Paroxysmal atrial fibrillation ([facility])   . Peripheral vascular disease ([place])   . Personal history of transient ischemic attack (TIA) and cerebral infarction without residual deficit   . Right pontine stroke ([place]) 02/27/2015  . Tooth disease     Assessment: 83 yo male on apixaban prior to arrival for hx of afib admitted with CP, initial troponin 7.59 and code stemi activated. Pt subsequently received heparin 4000 unit bolus in ED. Per famliy pt last took apixaban  dose at ~ 18:00 on 9/7. Pharmacy consulted to dose heparin. No signs/sx of bleeding per RN.  APTT >200, supratherapeutic at 106 Heparin level not drawn today. Heparin level supratherapeutic yesterday >2.2  Goal of Therapy:  Heparin level 0.3-0.7 units/ml aPTT 66-102 seconds Monitor platelets by anticoagulation protocol: Yes   Plan:  1. Decrease heparin infusion to 900 units/hr  2. Monitor for correlation of heparin level and aPTT. Daily heparin level, CBC Monitor clinical course, s/sx bleeding  Nida Boatman, PharmD PGY1 Acute Care Pharmacy Resident Pager: [phone] 08/01/2017 3:20 PM

## 2017-08-01 NOTE — Progress Notes (Addendum)
Patient had 2.55 second pause, and brady down to 30s for 5 beats. Patient asymptomatic, currently resting. Paged Dr. Eula Fried, cardiologist on call. Dr verbalized to hold beta blocker (Atenolol) which is scheduled for twice daily.Will continue to monitor and pass info to night shift RN.

## 2017-08-01 NOTE — Progress Notes (Signed)
Subjective:  I just saw him in the office last week.  He started having angina relieved with nitroglycerin on Wednesday at rest but didn't call anybody until last night.  Currently pain-free and has been taken off  Eliquis.  Objective:  Vital Signs in the last 24 hours: BP (!) 125/55 (BP Location: Left Arm)   Pulse 73   Temp (!) 97.5 F (36.4 C) (Oral)   Resp (!) 21   Ht 5\' 7"  (1.702 m)   Wt 76.6 kg (168 lb 14.4 oz)   SpO2 95%   BMI 26.45 kg/m   Physical Exam: Pleasant elderly male in no acute distress Lungs:  Clear Cardiac:  Regular rhythm, normal S1 and S2, no S3 Abdomen:  Soft, nontender, no masses Extremities:  No edema present  Intake/Output from previous day: 09/07 0701 - 09/08 0700 In: -  Out: 250 [Urine:250]  Weight Filed Weights   07/31/17 1840 08/01/17 0230  Weight: 79.4 kg (175 lb) 76.6 kg (168 lb 14.4 oz)    Lab Results: Basic Metabolic Panel:  Recent Labs  07/31/17 1847 08/01/17 0609  NA 139 136  K 3.9 3.6  CL 106 107  CO2 24 18*  GLUCOSE 98 89  BUN 23* 24*  CREATININE 1.78* 1.60*   CBC:  Recent Labs  07/31/17 1847 08/01/17 0609  WBC 8.5 6.0  HGB 16.6 15.0  HCT 48.4 44.0  MCV 102.1* 101.4*  PLT 119* 99*   Cardiac Enzymes: Troponin (Point of Care Test)  Recent Labs  07/31/17 2010  TROPIPOC 7.40*   Cardiac Panel (last 3 results)  Recent Labs  08/01/17 0057 08/01/17 0609  TROPONINI 10.85* 11.90*    Telemetry: Sinus rhythm   Assessment/Plan:  1.  Non-STEMI 2.  Chronic kidney disease stage 3-4 3.  Paroxysmal atrial fibrillation currently in sinus rhythm 4.  Long-term use of anticoagulation  Recommendations:  Awaiting cath for unstable angina/non-STEMI.  Allow Eliquis to wash out     W. Doristine Church  MD [facility]  08/01/2017, 12:59 PM

## 2017-08-01 NOTE — Plan of Care (Signed)
Problem: Pain Managment: Goal: General experience of comfort will improve Outcome: Progressing Pt denies any pain/discomfort throughout shift.

## 2017-08-02 ENCOUNTER — Inpatient Hospital Stay (HOSPITAL_COMMUNITY): Payer: Medicare Other

## 2017-08-02 DIAGNOSIS — I340 Nonrheumatic mitral (valve) insufficiency: Secondary | ICD-10-CM

## 2017-08-02 LAB — HEPARIN LEVEL (UNFRACTIONATED): Heparin Unfractionated: 0.87 IU/mL — ABNORMAL HIGH (ref 0.30–0.70)

## 2017-08-02 LAB — APTT
APTT: 118 s — AB (ref 24–36)
APTT: 64 s — AB (ref 24–36)
APTT: 74 s — AB (ref 24–36)

## 2017-08-02 LAB — ECHOCARDIOGRAM COMPLETE
Height: 67 in
WEIGHTICAEL: 2734.4 [oz_av]

## 2017-08-02 MED ORDER — ASPIRIN 81 MG PO CHEW
81.0000 mg | CHEWABLE_TABLET | ORAL | Status: AC
  Administered 2017-08-03: 81 mg via ORAL
  Filled 2017-08-02: qty 1

## 2017-08-02 MED ORDER — SODIUM CHLORIDE 0.9% FLUSH: 3.0000 mL | INTRAVENOUS | Status: DC | PRN

## 2017-08-02 MED ORDER — SODIUM CHLORIDE 0.9 % IV SOLN: 250.0000 mL | INTRAVENOUS | Status: DC | PRN

## 2017-08-02 MED ORDER — SODIUM CHLORIDE 0.9% FLUSH
3.0000 mL | Freq: Two times a day (BID) | INTRAVENOUS | Status: DC
  Administered 2017-08-03: 3 mL via INTRAVENOUS

## 2017-08-02 MED ORDER — SODIUM CHLORIDE 0.9 % WEIGHT BASED INFUSION
1.0000 mL/kg/h | INTRAVENOUS | Status: DC
  Administered 2017-08-03: 1 mL/kg/h via INTRAVENOUS

## 2017-08-02 NOTE — Progress Notes (Signed)
ANTICOAGULATION CONSULT NOTE - Follow Up Consult  Pharmacy Consult for heparin Indication: Afib and NSTEMI  Labs:  Recent Labs  07/31/17 1847 07/31/17 2019 08/01/17 0057 08/01/17 0609 08/01/17 1215 08/02/17 0257  HGB 16.6  --   --  15.0  --   --   HCT 48.4  --   --  44.0  --   --   PLT 119*  --   --  99*  --   --   APTT  --  >200*  --   --  106* 118*  LABPROT  --  19.5*  --  16.5*  --   --   INR  --  1.67  --  1.35  --   --   HEPARINUNFRC  --  >2.20*  --   --   --   --   CREATININE 1.78*  --   --  1.60*  --   --   TROPONINI  --   --  10.85* 11.90* 9.78*  --     Assessment: 83yo male remains above goal on heparin after rate change; RN notes some oozing around IV site earlier but was easily managed.  Goal of Therapy:  aPTT 66-102 seconds   Plan:  Will decrease heparin gtt by 2 units/kg/hr to 750 units/hr and check PTT in 8hr.  Wynona Neat, PharmD, BCPS  08/02/2017,4:22 AM

## 2017-08-02 NOTE — Progress Notes (Signed)
ANTICOAGULATION CONSULT NOTE - Follow Up Consult  Pharmacy Consult for heparin Indication: Afib and NSTEMI  Labs:  Recent Labs  07/31/17 1847  07/31/17 2019 08/01/17 0057 08/01/17 0609 08/01/17 1215 08/02/17 0257 08/02/17 0700 08/02/17 1227  HGB 16.6  --   --   --  15.0  --   --   --   --   HCT 48.4  --   --   --  44.0  --   --   --   --   PLT 119*  --   --   --  99*  --   --   --   --   APTT  --   < > >200*  --   --  106* 118*  --  64*  LABPROT  --   --  19.5*  --  16.5*  --   --   --   --   INR  --   --  1.67  --  1.35  --   --   --   --   HEPARINUNFRC  --   --  >2.20*  --   --   --   --  0.87*  --   CREATININE 1.78*  --   --   --  1.60*  --   --   --   --   TROPONINI  --   --   --  10.85* 11.90* 9.78*  --   --   --   < > = values in this interval not displayed.  Assessment: 83yo male being anticoagulated with heparin and aPTT is subtherapeutic at 64 after a dose decrease from 900 to 750; RN notes some oozing around IV site earlier this morning but it was easily managed. No other signs or symptoms of bleeding noted per RN.  Heparin level supratherapeutic at 0.87.  PTA Eliquis, currently holding for heparin infusions.  Goal of Therapy:  aPTT 66-102 seconds   Plan:  Increase heparin drip to 800 units/hr.  F/u 8 hour aPTT level Daily heparin level, CBC Monitor clinical course, s/sx bleeding  Nida Boatman, PharmD PGY1 Acute Care Pharmacy Resident Pager: [phone] 08/02/2017 1:37 PM

## 2017-08-02 NOTE — Progress Notes (Signed)
ANTICOAGULATION CONSULT NOTE - Follow Up Consult  Pharmacy Consult for heparin Indication: Afib and NSTEMI   Labs:  Recent Labs  07/31/17 1847  07/31/17 2019 08/01/17 0057 08/01/17 0609 08/01/17 1215 08/02/17 0257 08/02/17 0700 08/02/17 1227 08/02/17 2134  HGB 16.6  --   --   --  15.0  --   --   --   --   --   HCT 48.4  --   --   --  44.0  --   --   --   --   --   PLT 119*  --   --   --  99*  --   --   --   --   --   APTT  --   < > >200*  --   --  106* 118*  --  64* 74*  LABPROT  --   --  19.5*  --  16.5*  --   --   --   --   --   INR  --   --  1.67  --  1.35  --   --   --   --   --   HEPARINUNFRC  --   --  >2.20*  --   --   --   --  0.87*  --   --   CREATININE 1.78*  --   --   --  1.60*  --   --   --   --   --   TROPONINI  --   --   --  10.85* 11.90* 9.78*  --   --   --   --   < > = values in this interval not displayed.   Assessment/Plan:  83yo male therapeutic on heparin after rate changes. Will continue gtt at current rate and confirm stable with am labs.   Wynona Neat, PharmD, BCPS  08/02/2017,11:27 PM

## 2017-08-02 NOTE — Progress Notes (Signed)
  Echocardiogram 2D Echocardiogram has been performed.  Dayon Witt T Obbie Lewallen 08/02/2017, 1:30 PM

## 2017-08-02 NOTE — Progress Notes (Signed)
Subjective:  No recurrence of chest pain overnight.  Troponins are elevated suggestive of a non-STEMI.  His ejection fraction is 65-70%.  Objective:  Vital Signs in the last 24 hours: BP (!) 130/51 (BP Location: Right Arm)   Pulse 64   Temp 97.6 F (36.4 C) (Oral)   Resp (!) 22   Ht 5\' 7"  (1.702 m)   Wt 77.5 kg (170 lb 14.4 oz)   SpO2 97%   BMI 26.77 kg/m   Physical Exam: Pleasant elderly male in no acute distress Lungs:  Clear Cardiac:  Regular rhythm, normal S1 and S2, no S3 Abdomen:  Soft, nontender, no masses Extremities:  No edema present  Intake/Output from previous day: 09/08 0701 - 09/09 0700 In: 956.7 [P.O.:740; I.V.:216.7] Out: 645 [Urine:645]  Weight Filed Weights   07/31/17 1840 08/01/17 0230 08/02/17 0352  Weight: 79.4 kg (175 lb) 76.6 kg (168 lb 14.4 oz) 77.5 kg (170 lb 14.4 oz)    Lab Results: Basic Metabolic Panel:  Recent Labs  07/31/17 1847 08/01/17 0609  NA 139 136  K 3.9 3.6  CL 106 107  CO2 24 18*  GLUCOSE 98 89  BUN 23* 24*  CREATININE 1.78* 1.60*   CBC:  Recent Labs  07/31/17 1847 08/01/17 0609  WBC 8.5 6.0  HGB 16.6 15.0  HCT 48.4 44.0  MCV 102.1* 101.4*  PLT 119* 99*   Cardiac Enzymes: Troponin (Point of Care Test)  Recent Labs  07/31/17 2010  TROPIPOC 7.40*   Cardiac Panel (last 3 results)  Recent Labs  08/01/17 0057 08/01/17 0609 08/01/17 1215  TROPONINI 10.85* 11.90* 9.78*    Telemetry: Sinus rhythm   Assessment/Plan:  1.  Non-STEMI 2.  Chronic kidney disease stage 3-4 3.  Paroxysmal atrial fibrillation currently in sinus rhythm 4.  Long-term use of anticoagulation  Recommendations:  Plan catheterization tomorrow.  We'll do overnight hydration because of renal insufficiency.Cardiac catheterization was discussed with the patient fully including risks of myocardial infarction, death, stroke, bleeding, arrhythmia, dye allergy, renal insufficiency or bleeding.  The patient understands and is willing to  proceed.  Possibility of intervention at the same time also discussed with patient and family and they understand and are agreeable to proceed      W. Doristine Church  MD [facility]  08/02/2017, 4:01 PM

## 2017-08-03 ENCOUNTER — Encounter (HOSPITAL_COMMUNITY): Payer: Self-pay | Admitting: Cardiovascular Disease

## 2017-08-03 ENCOUNTER — Inpatient Hospital Stay (HOSPITAL_COMMUNITY)
Admission: EM | Disposition: A | Discharge status: Skilled Nursing Facility | Payer: Self-pay | Source: Home / Self Care | Attending: Cardiothoracic Surgery

## 2017-08-03 ENCOUNTER — Other Ambulatory Visit: Payer: Self-pay | Admitting: *Deleted

## 2017-08-03 DIAGNOSIS — I2510 Atherosclerotic heart disease of native coronary artery without angina pectoris: Secondary | ICD-10-CM

## 2017-08-03 DIAGNOSIS — I340 Nonrheumatic mitral (valve) insufficiency: Secondary | ICD-10-CM

## 2017-08-03 DIAGNOSIS — I25110 Atherosclerotic heart disease of native coronary artery with unstable angina pectoris: Secondary | ICD-10-CM

## 2017-08-03 HISTORY — PX: LEFT HEART CATH AND CORONARY ANGIOGRAPHY: CATH118249

## 2017-08-03 LAB — BASIC METABOLIC PANEL
ANION GAP: 7 (ref 5–15)
BUN: 24 mg/dL — ABNORMAL HIGH (ref 6–20)
CALCIUM: 8.7 mg/dL — AB (ref 8.9–10.3)
CO2: 22 mmol/L (ref 22–32)
Chloride: 107 mmol/L (ref 101–111)
Creatinine, Ser: 1.71 mg/dL — ABNORMAL HIGH (ref 0.61–1.24)
GFR, EST AFRICAN AMERICAN: 41 mL/min — AB (ref >60)
GFR, EST NON AFRICAN AMERICAN: 36 mL/min — AB (ref >60)
Glucose, Bld: 109 mg/dL — ABNORMAL HIGH (ref 65–99)
POTASSIUM: 3.8 mmol/L (ref 3.5–5.1)
Sodium: 136 mmol/L (ref 135–145)

## 2017-08-03 LAB — URINALYSIS, ROUTINE W REFLEX MICROSCOPIC
Bilirubin Urine: NEGATIVE
Glucose, UA: NEGATIVE mg/dL
Ketones, ur: NEGATIVE mg/dL
Nitrite: NEGATIVE
Protein, ur: NEGATIVE mg/dL
Specific Gravity, Urine: 1.016 (ref 1.005–1.030)
Squamous Epithelial / LPF: NONE SEEN
pH: 5 (ref 5.0–8.0)

## 2017-08-03 LAB — HEPARIN LEVEL (UNFRACTIONATED): HEPARIN UNFRACTIONATED: 0.73 [IU]/mL — AB (ref 0.30–0.70)

## 2017-08-03 LAB — APTT: APTT: 91 s — AB (ref 24–36)

## 2017-08-03 LAB — GLUCOSE, CAPILLARY: GLUCOSE-CAPILLARY: 118 mg/dL — AB (ref 65–99)

## 2017-08-03 LAB — POCT ACTIVATED CLOTTING TIME: ACTIVATED CLOTTING TIME: 142 s

## 2017-08-03 SURGERY — LEFT HEART CATH AND CORONARY ANGIOGRAPHY
Anesthesia: LOCAL

## 2017-08-03 MED ORDER — HEPARIN (PORCINE) IN NACL 2-0.9 UNIT/ML-% IJ SOLN
INTRAMUSCULAR | Status: AC
  Filled 2017-08-03: qty 1000

## 2017-08-03 MED ORDER — IOPAMIDOL (ISOVUE-370) INJECTION 76%
INTRAVENOUS | Status: AC
  Filled 2017-08-03: qty 100

## 2017-08-03 MED ORDER — IOPAMIDOL (ISOVUE-370) INJECTION 76%
INTRAVENOUS | Status: DC | PRN
  Administered 2017-08-03: 50 mL via INTRA_ARTERIAL

## 2017-08-03 MED ORDER — SODIUM CHLORIDE 0.9% FLUSH: 3.0000 mL | INTRAVENOUS | Status: DC | PRN

## 2017-08-03 MED ORDER — SODIUM CHLORIDE 0.9 % IV SOLN: 250.0000 mL | INTRAVENOUS | Status: DC | PRN

## 2017-08-03 MED ORDER — HEPARIN (PORCINE) IN NACL 100-0.45 UNIT/ML-% IJ SOLN
950.0000 [IU]/h | INTRAMUSCULAR | Status: DC
  Administered 2017-08-03: 800 [IU]/h via INTRAVENOUS
  Administered 2017-08-04 – 2017-08-05 (×2): 950 [IU]/h via INTRAVENOUS
  Filled 2017-08-03 (×2): qty 250

## 2017-08-03 MED ORDER — LIDOCAINE HCL (PF) 1 % IJ SOLN
INTRAMUSCULAR | Status: AC
  Filled 2017-08-03: qty 30

## 2017-08-03 MED ORDER — FENTANYL CITRATE (PF) 100 MCG/2ML IJ SOLN
INTRAMUSCULAR | Status: DC | PRN
  Administered 2017-08-03 (×2): 25 ug via INTRAVENOUS

## 2017-08-03 MED ORDER — VERAPAMIL HCL 2.5 MG/ML IV SOLN
INTRAVENOUS | Status: AC
  Filled 2017-08-03: qty 2

## 2017-08-03 MED ORDER — MIDAZOLAM HCL 2 MG/2ML IJ SOLN
INTRAMUSCULAR | Status: AC
  Filled 2017-08-03: qty 2

## 2017-08-03 MED ORDER — ONDANSETRON HCL 4 MG/2ML IJ SOLN
4.0000 mg | Freq: Four times a day (QID) | INTRAMUSCULAR | Status: DC | PRN
Start: 2017-08-03 — End: 2017-08-07

## 2017-08-03 MED ORDER — LIDOCAINE HCL (PF) 1 % IJ SOLN
INTRAMUSCULAR | Status: DC | PRN
  Administered 2017-08-03 (×2): 15 mL
  Administered 2017-08-03: 2 mL

## 2017-08-03 MED ORDER — MAGNESIUM HYDROXIDE 400 MG/5ML PO SUSP
30.0000 mL | Freq: Every day | ORAL | Status: DC | PRN
Start: 2017-08-03 — End: 2017-08-07
  Administered 2017-08-04: 30 mL via ORAL
  Filled 2017-08-03: qty 30

## 2017-08-03 MED ORDER — SODIUM CHLORIDE 0.9 % WEIGHT BASED INFUSION: 1.0000 mL/kg/h | INTRAVENOUS | Status: AC

## 2017-08-03 MED ORDER — SODIUM CHLORIDE 0.9% FLUSH
3.0000 mL | Freq: Two times a day (BID) | INTRAVENOUS | Status: DC
  Administered 2017-08-04 – 2017-08-05 (×2): 3 mL via INTRAVENOUS

## 2017-08-03 MED ORDER — MIDAZOLAM HCL 2 MG/2ML IJ SOLN
INTRAMUSCULAR | Status: DC | PRN
  Administered 2017-08-03 (×2): 1 mg via INTRAVENOUS

## 2017-08-03 MED ORDER — FENTANYL CITRATE (PF) 100 MCG/2ML IJ SOLN
INTRAMUSCULAR | Status: AC
  Filled 2017-08-03: qty 2

## 2017-08-03 MED ORDER — HEPARIN (PORCINE) IN NACL 2-0.9 UNIT/ML-% IJ SOLN
INTRAMUSCULAR | Status: AC | PRN
  Administered 2017-08-03: 1000 mL

## 2017-08-03 SURGICAL SUPPLY — 15 items
CATH INFINITI 5FR JL4 (CATHETERS) ×1 IMPLANT
CATH INFINITI JR4 5F (CATHETERS) ×1 IMPLANT
COVER PRB 48X5XTLSCP FOLD TPE (BAG) IMPLANT
COVER PROBE 5X48 (BAG) ×4
GLIDESHEATH SLEND SS 6F .021 (SHEATH) ×1 IMPLANT
GUIDEWIRE INQWIRE 1.5J.035X260 (WIRE) IMPLANT
INQWIRE 1.5J .035X260CM (WIRE) ×2
KIT HEART LEFT (KITS) ×2 IMPLANT
PACK CARDIAC CATHETERIZATION (CUSTOM PROCEDURE TRAY) ×2 IMPLANT
SHEATH PINNACLE 5F 10CM (SHEATH) ×2 IMPLANT
SYR MEDRAD MARK V 150ML (SYRINGE) ×2 IMPLANT
TRANSDUCER W/STOPCOCK (MISCELLANEOUS) ×2 IMPLANT
TUBING CIL FLEX 10 FLL-RA (TUBING) ×2 IMPLANT
WIRE EMERALD 3MM-J .035X150CM (WIRE) ×1 IMPLANT
WIRE HI TORQ VERSACORE-J 145CM (WIRE) ×1 IMPLANT

## 2017-08-03 NOTE — Progress Notes (Addendum)
ANTICOAGULATION CONSULT NOTE - Follow Up Consult  Pharmacy Consult for Heparin Indication: chest pain/ACS  Allergies  Allergen Reactions  . Lipitor [Atorvastatin] Other (See Comments)    Severe constipation    Patient Measurements: Height: 5\' 7"  (170.2 cm) Weight: 173 lb 6.4 oz (78.7 kg) IBW/kg (Calculated) : 66.1 Heparin Dosing Weight: 78.7 kg  Vital Signs: BP: 126/50 (09/10 1305) Pulse Rate: 55 (09/10 1305)  Labs:  Recent Labs  07/31/17 1847  07/31/17 2019 08/01/17 0057 08/01/17 0609 08/01/17 1215  08/02/17 0700 08/02/17 1227 08/02/17 2134 08/03/17 0206  HGB 16.6  --   --   --  15.0  --   --   --   --   --   --   HCT 48.4  --   --   --  44.0  --   --   --   --   --   --   PLT 119*  --   --   --  99*  --   --   --   --   --   --   APTT  --   < > >200*  --   --  106*  < >  --  64* 74* 91*  LABPROT  --   --  19.5*  --  16.5*  --   --   --   --   --   --   INR  --   --  1.67  --  1.35  --   --   --   --   --   --   HEPARINUNFRC  --   --  >2.20*  --   --   --   --  0.87*  --   --  0.73*  CREATININE 1.78*  --   --   --  1.60*  --   --   --   --   --  1.71*  TROPONINI  --   --   --  10.85* 11.90* 9.78*  --   --   --   --   --   < > = values in this interval not displayed.  Estimated Creatinine Clearance: 31.1 mL/min (A) (by C-G formula based on SCr of 1.71 mg/dL (H)).   Medications:  Scheduled:  . allopurinol  300 mg Oral Daily  . aspirin EC  81 mg Oral Daily  . atenolol  25 mg Oral BID  . cholecalciferol  2,000 Units Oral Daily  . docusate sodium  100 mg Oral Daily  . finasteride  5 mg Oral Daily  . lisinopril  20 mg Oral Daily  . mirabegron ER  50 mg Oral Daily  . pantoprazole  40 mg Oral Daily  . rosuvastatin  40 mg Oral Daily  . sodium chloride flush  3 mL Intravenous Q12H   Infusions:  . sodium chloride    . sodium chloride 1 mL/kg/hr (08/03/17 1112)  . heparin 800 Units/hr (08/03/17 0510)    Assessment: 83 yo M admitted with CP.  Pt went to  cardiac cath today and found severe LM stenosis, severe ostial stenosis of L circ, chronic total occlusion of RCA.  Pharmacy asked to restart heparin pending CABG evaluation.  Pt was previously therapeutic on heparin at 800 units/hr.  Of note, pt on Eliquis PTA which can elevate heparin levels.  aPTTs are being used to guide heparin dosing at this time.  Last Eliquis dose was 9/7 at 1800.  Sheath pulled  9/10 at 1155.  Goal of Therapy:  aPTT goal 66-102 sec Heparin level 0.3-0.7 units/ml Monitor platelets by anticoagulation protocol: Yes   Plan:  Sheath pulled at 1155.  Start heparin at 8pm. Heparin at 800 units/hr (no bolus). Next heparin level and aPTT with AM labs 9/11 aPTT, Heparin level, and CBC daily  [employer], Pharm.D., BCPS Clinical Pharmacist Pager: [phone] Clinical phone for 08/03/2017 from 8:30-4:00 is [phone]. After 4pm, please call Main Rx [phone]) for assistance. 08/03/2017 1:32 PM

## 2017-08-03 NOTE — Interval H&P Note (Signed)
Cath Lab Visit (complete for each Cath Lab visit)  Clinical Evaluation Leading to the Procedure:   ACS: Yes.    Non-ACS:    Anginal Classification: CCS IV  Anti-ischemic medical therapy: Minimal Therapy (1 class of medications)  Non-Invasive Test Results: No non-invasive testing performed  Prior CABG: No previous CABG      History and Physical Interval Note:  08/03/2017 9:10 AM  Stanley Robles  has presented today for surgery, with the diagnosis of NSTEMI  The various methods of treatment have been discussed with the patient and family. After consideration of risks, benefits and other options for treatment, the patient has consented to  Procedure(s): LEFT HEART CATH AND CORONARY ANGIOGRAPHY (N/A) as a surgical intervention .  The patient's history has been reviewed, patient examined, no change in status, stable for surgery.  I have reviewed the patient's chart and labs.  Questions were answered to the patient's satisfaction.     Sherren Mocha

## 2017-08-03 NOTE — Progress Notes (Addendum)
Site area: LFA Site Prior to Removal:  Level 0 Pressure Applied For:20 min Manual:   yes Patient Status During Pull:  stable Post Pull Site:  Level 0 Post Pull Instructions Given:  yes Post Pull Pulses Present: palpable Dressing Applied:  tegaderm Bedrest begins @  Comments:by Archie Patten S/canderson

## 2017-08-03 NOTE — Progress Notes (Signed)
Subjective:  He is doing well after catheterization and had severe ostial left main disease as well as ostial circumflex with total right coronary artery disease.  No recurrent chest pain.  Objective:  Vital Signs in the last 24 hours: BP 119/66 (BP Location: Right Arm)   Pulse (!) 53   Temp (!) 97.5 F (36.4 C) (Axillary)   Resp 15   Ht 5\' 7"  (1.702 m)   Wt 78.7 kg (173 lb 6.4 oz)   SpO2 100%   BMI 27.16 kg/m   Physical Exam: Pleasant elderly male in no acute distress Lungs:  Clear Cardiac:  Regular rhythm, normal S1 and S2, no S3 Abdomen:  Soft, nontender, no masses Extremities:  No edema present  Intake/Output from previous day: 09/09 0701 - 09/10 0700 In: 937.2 [P.O.:240; I.V.:697.2] Out: 250 [Urine:250]  Weight Filed Weights   08/01/17 0230 08/02/17 0352 08/03/17 0500  Weight: 76.6 kg (168 lb 14.4 oz) 77.5 kg (170 lb 14.4 oz) 78.7 kg (173 lb 6.4 oz)    Lab Results: Basic Metabolic Panel:  Recent Labs  08/01/17 0609 08/03/17 0206  NA 136 136  K 3.6 3.8  CL 107 107  CO2 18* 22  GLUCOSE 89 109*  BUN 24* 24*  CREATININE 1.60* 1.71*   CBC:  Recent Labs  07/31/17 1847 08/01/17 0609  WBC 8.5 6.0  HGB 16.6 15.0  HCT 48.4 44.0  MCV 102.1* 101.4*  PLT 119* 99*   Cardiac Enzymes: Troponin (Point of Care Test)  Recent Labs  07/31/17 2010  TROPIPOC 7.40*   Cardiac Panel (last 3 results)  Recent Labs  08/01/17 0057 08/01/17 0609 08/01/17 1215  TROPONINI 10.85* 11.90* 9.78*    Telemetry: Sinus rhythm   Assessment/Plan:  1.  Non-STEMIWith severe left main disease and total right coronary artery stenosis 2.  Chronic kidney disease stage 3-4 3.  Paroxysmal atrial fibrillation currently in sinus rhythm 4.  Long-term use of anticoagulation  Recommendations:  Agree with Dr. Lucianne Lei Tright's assessment that this is increased risk.  He was hydrated for catheterization with minimal dye given.  Awaiting results of further testing to determine whether  he would be a candidate for bypass grafting although his risk is increased very little in the way of options otherwise and prognosis very poor without.  Discussed with family and patient.      Kerry Hough  MD [facility]  08/03/2017, 6:20 PM

## 2017-08-03 NOTE — Progress Notes (Signed)
Patient up with assist to void, tolerated activity well, bilateral groin sites WNL before and after voiding.

## 2017-08-03 NOTE — Progress Notes (Addendum)
Site area: RFA Site Prior to Removal:  Level 0 Pressure Applied For:25 min Manual:   yes Patient Status During Pull: stable  Post Pull Site:  Level 0 Post Pull Instructions Given: yes  Post Pull Pulses Present: weakly palpable PT Dressing Applied: tegaderm  Bedrest begins @ 1601 Comments:by Archie Patten S/canderson

## 2017-08-03 NOTE — H&P (View-Only) (Signed)
Subjective:  No recurrence of chest pain overnight.  Troponins are elevated suggestive of a non-STEMI.  His ejection fraction is 65-70%.  Objective:  Vital Signs in the last 24 hours: BP (!) 130/51 (BP Location: Right Arm)   Pulse 64   Temp 97.6 F (36.4 C) (Oral)   Resp (!) 22   Ht 5\' 7"  (1.702 m)   Wt 77.5 kg (170 lb 14.4 oz)   SpO2 97%   BMI 26.77 kg/m   Physical Exam: Pleasant elderly male in no acute distress Lungs:  Clear Cardiac:  Regular rhythm, normal S1 and S2, no S3 Abdomen:  Soft, nontender, no masses Extremities:  No edema present  Intake/Output from previous day: 09/08 0701 - 09/09 0700 In: 956.7 [P.O.:740; I.V.:216.7] Out: 645 [Urine:645]  Weight Filed Weights   07/31/17 1840 08/01/17 0230 08/02/17 0352  Weight: 79.4 kg (175 lb) 76.6 kg (168 lb 14.4 oz) 77.5 kg (170 lb 14.4 oz)    Lab Results: Basic Metabolic Panel:  Recent Labs  07/31/17 1847 08/01/17 0609  NA 139 136  K 3.9 3.6  CL 106 107  CO2 24 18*  GLUCOSE 98 89  BUN 23* 24*  CREATININE 1.78* 1.60*   CBC:  Recent Labs  07/31/17 1847 08/01/17 0609  WBC 8.5 6.0  HGB 16.6 15.0  HCT 48.4 44.0  MCV 102.1* 101.4*  PLT 119* 99*   Cardiac Enzymes: Troponin (Point of Care Test)  Recent Labs  07/31/17 2010  TROPIPOC 7.40*   Cardiac Panel (last 3 results)  Recent Labs  08/01/17 0057 08/01/17 0609 08/01/17 1215  TROPONINI 10.85* 11.90* 9.78*    Telemetry: Sinus rhythm   Assessment/Plan:  1.  Non-STEMI 2.  Chronic kidney disease stage 3-4 3.  Paroxysmal atrial fibrillation currently in sinus rhythm 4.  Long-term use of anticoagulation  Recommendations:  Plan catheterization tomorrow.  We'll do overnight hydration because of renal insufficiency.Cardiac catheterization was discussed with the patient fully including risks of myocardial infarction, death, stroke, bleeding, arrhythmia, dye allergy, renal insufficiency or bleeding.  The patient understands and is willing to  proceed.  Possibility of intervention at the same time also discussed with patient and family and they understand and are agreeable to proceed      W. Doristine Church  MD [facility]  08/02/2017, 4:01 PM

## 2017-08-03 NOTE — Consult Note (Signed)
[address]       [place] [address]             [phone]        [facility] Medical Record #[id number] Date of Birth: October 06, 1932  Referring: Ezzie Dural.D Primary Care: Glenda Chroman, MD  Chief Complaint:    Chief Complaint  Patient presents with  . Chest Pain  Patient examined, cardiac angiogram and 2-D echocardiogram images personally reviewed and counseled with patient and family  History of Present Illness:     83 year old somewhat fragile Caucasian male with symptoms of unstable angina for the past several days. He was admitted 2 days ago with resting angina, diaphoresis, fatigue, and a troponin of 7.0 the patient has history of atrial fibrillation and is on chronic eliquis. The patient had a right pontine stroke 2 years ago which has had a significant impact on his overall functional status with specific problems related to swallowing from the brainstem stroke. He has not had any eliquis  since admission to the hospital when he was placed on heparin. His echocardiogram showed normal LV systolic function with mild-moderate mitral regurgitation. The patient had a transfemoral cardiac catheterization showing significant left main and proximal LAD stenosis with chronic occlusion of the RCA. LV EDP was elevated 25 mmHg. The patient is currently still somewhat sedated after the procedure but I was able to obtain adequate history and review of systems by patient as helped by family. The patient has significant hearing loss.  The patient apparently is still definitely ambulatory and make a point to walk around his yard twice a day. He is able to visit friends and family. His weight has been stable. He has had no recent falls. He has had previous left total knee replacement.  The patient has chronic kidney disease-creatinine 1.7. After his stroke his renal function deteriorated according to family. Also after his knee surgery his renal function also  deteriorated but he has never needed dialysis.   Current Activity/ Functional Status: Decreased overall functional status after brainstem stroke 2 years ago but still lives at home with family assistance. He went to the [facility] after his stroke 2 years ago.   Zubrod Score: At the time of surgery this patient's most appropriate activity status/level should be described as: []     0    Normal activity, no symptoms []     1    Restricted in physical strenuous activity but ambulatory, able to do out light work []     2    Ambulatory and capable of self care, unable to do work activities, up and about                 more than 50%  Of the time                            [x]     3    Only limited self care, in bed greater than 50% of waking hours []     4    Completely disabled, no self care, confined to bed or chair []     5    Moribund  Past Medical History:  Diagnosis Date  . BPH (benign prostatic hypertrophy)   . Cancer ([place])    CANCEROUS MOLE REMOVED FROM BACK - SEVERAL YRS AGO  . Chronic kidney disease stage 3   . Coronary artery disease    DR.  TILLEY IS PT'S CARDIOLGIST  . GERD (gastroesophageal reflux disease)   . Gout   . Hyperlipidemia   . Hypertensive heart disease   . Incontinence of urine   . Memory loss   . Myocardial infarction ([place]) 1994  . Osteoarthritis   . Paroxysmal atrial fibrillation ([facility])   . Peripheral vascular disease ([place])   . Personal history of transient ischemic attack (TIA) and cerebral infarction without residual deficit   . Right pontine stroke ([place]) 02/27/2015  . Tooth disease     Past Surgical History:  Procedure Laterality Date  . CARDIAC CATHETERIZATION  07/21/2005  . HERNIA REPAIR  12/26/2002   BILATERAL INGUINAL HERNIA REPAIR  . LEFT HEART CATH AND CORONARY ANGIOGRAPHY N/A 08/03/2017   Procedure: LEFT HEART CATH AND CORONARY ANGIOGRAPHY;  Surgeon: Sherren Mocha, MD;  Location: [facility];  Service: Cardiovascular;  Laterality: N/A;  .  MASTOID SURGERY X 5    . PROSTATE SURGERY    . TOTAL KNEE ARTHROPLASTY Left 03/28/2013   Procedure: LEFT TOTAL KNEE ARTHROPLASTY;  Surgeon: Gearlean Alf, MD;  Location: [facility];  Service: Orthopedics;  Laterality: Left;    History  Smoking Status  . Former Smoker  Smokeless Tobacco  . Never Used    Comment: QUIT SMOKING PRIOR TO 1980    History  Alcohol Use No    Social History   Social History  . Marital status: Widowed    Spouse name: N/A  . Number of children: 3  . Years of education: 64   Occupational History  . retired     Regulatory affairs officer   Social History Main Topics  . Smoking status: Former Research scientist (life sciences)  . Smokeless tobacco: Never Used     Comment: QUIT SMOKING PRIOR TO 1980  . Alcohol use No  . Drug use: No  . Sexual activity: Not on file   Other Topics Concern  . Not on file   Social History Narrative   Retired Academic librarian and body.  Widower. Has sig other   Right handed   Caffeine use - coffee 1 cup daily    Allergies  Allergen Reactions  . Lipitor [Atorvastatin] Other (See Comments)    Severe constipation    Current Facility-Administered Medications  Medication Dose Route Frequency Provider Last Rate Last Dose  . 0.9 %  sodium chloride infusion  250 mL Intravenous PRN Sherren Mocha, MD      . 0.9% sodium chloride infusion  1 mL/kg/hr Intravenous Continuous Sherren Mocha, MD 78.7 mL/hr at 08/03/17 1112 1 mL/kg/hr at 08/03/17 1112  . acetaminophen (TYLENOL) tablet 650 mg  650 mg Oral Q6H PRN Geralynn Ochs T, MD   650 mg at 08/01/17 2153  . allopurinol (ZYLOPRIM) tablet 300 mg  300 mg Oral Daily Geralynn Ochs T, MD   300 mg at 08/03/17 0852  . aspirin EC tablet 81 mg  81 mg Oral Daily Geralynn Ochs T, MD   81 mg at 08/03/17 4098  . atenolol (TENORMIN) tablet 25 mg  25 mg Oral BID Geralynn Ochs T, MD   25 mg at 08/03/17 0851  . cholecalciferol (VITAMIN D) tablet 2,000 Units  2,000 Units Oral Daily Geralynn Ochs T, MD   2,000 Units at 08/03/17 [phone]  .  docusate sodium (COLACE) capsule 100 mg  100 mg Oral Daily Geralynn Ochs T, MD   100 mg at 08/03/17 0851  . finasteride (PROSCAR) tablet 5 mg  5 mg Oral Daily Flossie Dibble, MD  5 mg at 08/03/17 0851  . heparin ADULT infusion 100 units/mL (25000 units/262mL sodium chloride 0.45%)  800 Units/hr Intravenous Continuous Hammons, Theone Murdoch, RPH      . lisinopril (PRINIVIL,ZESTRIL) tablet 20 mg  20 mg Oral Daily Geralynn Ochs T, MD   20 mg at 08/03/17 0851  . mirabegron ER (MYRBETRIQ) tablet 50 mg  50 mg Oral Daily Geralynn Ochs T, MD   50 mg at 08/03/17 0850  . nitroGLYCERIN (NITROSTAT) SL tablet 0.4 mg  0.4 mg Sublingual Q5 min PRN Geralynn Ochs T, MD      . ondansetron [facility]) injection 4 mg  4 mg Intravenous Q6H PRN Sherren Mocha, MD      . pantoprazole (PROTONIX) EC tablet 40 mg  40 mg Oral Daily Geralynn Ochs T, MD   40 mg at 08/03/17 0850  . rosuvastatin (CRESTOR) tablet 40 mg  40 mg Oral Daily Geralynn Ochs T, MD   40 mg at 08/02/17 1007  . sodium chloride flush (NS) 0.9 % injection 3 mL  3 mL Intravenous Q12H Sherren Mocha, MD      . sodium chloride flush (NS) 0.9 % injection 3 mL  3 mL Intravenous PRN Sherren Mocha, MD        Prescriptions Prior to Admission  Medication Sig Dispense Refill Last Dose  . acetaminophen (TYLENOL) 500 MG tablet Take 1,000 mg by mouth every 6 (six) hours as needed for pain.   07/31/2017 at prn  . allopurinol (ZYLOPRIM) 300 MG tablet 300 mg daily.   07/31/2017 at Unknown time  . apixaban (ELIQUIS) 2.5 MG TABS tablet Take 1 tablet (2.5 mg total) by mouth 2 (two) times daily. 60 tablet  07/31/2017 at 1800  . atenolol (TENORMIN) 25 MG tablet Take 1 tablet (25 mg total) by mouth 2 (two) times daily. (Patient taking differently: Take 25 mg by mouth daily. )   07/31/2017 at Unknown time  . Cholecalciferol (VITAMIN D3) 2000 units TABS Take 2,000 Units by mouth daily.   07/31/2017 at Unknown time  . docusate sodium (COLACE) 100 MG capsule Take 100 mg by mouth daily.    07/31/2017 at Unknown time  . finasteride (PROSCAR) 5 MG tablet Take 5 mg by mouth daily.   07/31/2017 at Unknown time  . lisinopril (PRINIVIL,ZESTRIL) 20 MG tablet 20 mg daily.   07/31/2017 at Unknown time  . MYRBETRIQ 50 MG TB24 tablet Take 50 mg by mouth daily.   07/31/2017 at Unknown time  . nitroGLYCERIN (NITROSTAT) 0.4 MG SL tablet Place 0.4 mg under the tongue every 5 (five) minutes as needed for chest pain.   07/31/2017 at PRN  . omeprazole (PRILOSEC) 20 MG capsule Take 20 mg by mouth daily.   07/31/2017 at Unknown time  . rosuvastatin (CRESTOR) 40 MG tablet 40 mg daily.   07/31/2017 at Unknown time    Family History  Problem Relation Age of Onset  . Hypertension Father   . Hypertension Mother   . Hyperlipidemia Mother   . Cancer Brother      Review of Systems:       Cardiac Review of Systems: Y or N  Chest Pain [  Yes  ]  Resting SOB [ no  ] Exertional SOB  [place]  ]  Orthopnea [ no ]   Pedal Edema [ no  ]    Palpitations [place]  ] Syncope  [ yes ]   Presyncope [  yes ]  General Review of Systems: [Y] = yes [  ]=  no Constitional: recent weight change [  ]; anorexia [  ]; fatigue [ yes ]; nausea [  ]; night sweats [  ]; fever [  ]; or chills [  ]                                                               Dental: poor dentition[  ]; Last Dentist visit: One year  Eye : blurred vision [  ]; diplopia [   ]; vision changes [  ];  Amaurosis fugax[  ]; Resp: cough [  ];  wheezing[  ];  hemoptysis[  ]; shortness of breath[ yes ]; paroxysmal nocturnal dyspnea[  ]; dyspnea on exertion[  ]; or orthopnea[  ];  GI:  gallstones[  ], vomiting[  ];  dysphagia[  ]; melena[  ];  hematochezia [  ]; heartburn[  ];   Hx of  Colonoscopy[  ]; GU: kidney stones [  ]; hematuria[  ];   dysuria [  ];  nocturia[ yes ];  history of     obstruction [  ]; urinary frequency [  ]             Skin: rash, swelling[  ];, hair loss[  ];  peripheral edema[  ];  or itching[  ]; Musculosketetal: myalgias[  ];  joint swelling[  ];   joint erythema[  ];  joint pain[  ];  back pain[  ];  Heme/Lymph: bruising[ yes ];  bleeding[  ];  anemia[  ];  Neuro: TIA[ yes may 2018  ];  headaches[  ];  stroke[  ];  vertigo[  ];  seizures[  ];   paresthesias[  ];  difficulty walking[ yes ];  Psych:depression[  ]; anxiety[  ];  Endocrine: diabetes[ yes ];  thyroid dysfunction[  ];  Immunizations: Flu [  ]; Pneumococcal[  ];  Other: Right-hand dominant  Physical Exam: BP 119/66 (BP Location: Right Arm)   Pulse (!) 53   Temp (!) 97.5 F (36.4 C) (Axillary)   Resp 15   Ht 5\' 7"  (1.702 m)   Wt 173 lb 6.4 oz (78.7 kg)   SpO2 100%   BMI 27.16 kg/m       Physical Exam  General: Elderly hard of hearing somewhat fragile-appearing Caucasian male after cardiac catheterization HEENT: Normocephalic pupils equal , dentition adequate Neck: Supple without JVD, adenopathy, or bruit Chest: Clear to auscultation, symmetrical breath sounds, no rhonchi, no tenderness             or deformity Cardiovascular: Regular rate and rhythm, no murmur, no gallop, peripheral pulses             palpable in all extremities Abdomen:  Soft, nontender, no palpable mass or organomegaly Extremities: Warm, well-perfused, no clubbing cyanosis edema or tenderness,              no venous stasis changes of the legs Rectal/GU: Deferred Neuro: Grossly non--focal and symmetrical throughout Skin: Clean and dry without rash or ulceration    Diagnostic Studies & Laboratory data:     Recent Radiology Findings:   No results found.   I have independently reviewed the above radiologic studies.  Recent Lab Findings: Lab Results  Component Value Date   WBC 6.0 08/01/2017  HGB 15.0 08/01/2017   HCT 44.0 08/01/2017   PLT 99 (L) 08/01/2017   GLUCOSE 109 (H) 08/03/2017   CHOL 162 02/28/2015   TRIG 145 02/28/2015   HDL 31 (L) 02/28/2015   LDLCALC 102 (H) 02/28/2015   ALT 19 08/01/2017   AST 60 (H) 08/01/2017   NA 136 08/03/2017   K 3.8 08/03/2017   CL 107  08/03/2017   CREATININE 1.71 (H) 08/03/2017   BUN 24 (H) 08/03/2017   CO2 22 08/03/2017   TSH 3.788 08/01/2017   INR 1.35 08/01/2017   HGBA1C 5.2 08/01/2017      Assessment / Plan:     Severe coronary disease with chronic total RCA occlusion with 75% left main stenosis and 95% proximal LAD stenosis with non-STEMI Preserved LV systolic function Mild-moderate mitral regurgitation History of PAF on chronic anticoagulation- eliquis Brain stem stroke 2016 History of chronic renal disease stage III-creatinine 1.7, urine with negative protein   Patient is borderline candidate for  CABG because of his previous brain stem stroke however his outlook without coronary revascularization is extremely poor. We'll complete pre-CABG evaluation and meet with patient and family again after he has recovered from the sedation from his cardiac cath. We'll follow post cath renal function, complete PFTs, and ask for physical therapy evaluation of functional status   @ME1 @ 08/03/2017 3:53 PM

## 2017-08-04 ENCOUNTER — Inpatient Hospital Stay (HOSPITAL_COMMUNITY): Payer: Medicare Other

## 2017-08-04 LAB — BLOOD GAS, ARTERIAL
Acid-base deficit: 3.2 mmol/L — ABNORMAL HIGH (ref 0.0–2.0)
Bicarbonate: 19.8 mmol/L — ABNORMAL LOW (ref 20.0–28.0)
Drawn by: 347621
FIO2: 0.21
O2 Saturation: 96.5 %
Patient temperature: 97.6
pCO2 arterial: 26.2 mmHg — ABNORMAL LOW (ref 32.0–48.0)
pH, Arterial: 7.487 — ABNORMAL HIGH (ref 7.350–7.450)
pO2, Arterial: 81.7 mmHg — ABNORMAL LOW (ref 83.0–108.0)

## 2017-08-04 LAB — COMPREHENSIVE METABOLIC PANEL
ALT: 17 U/L (ref 17–63)
AST: 24 U/L (ref 15–41)
Albumin: 2.7 g/dL — ABNORMAL LOW (ref 3.5–5.0)
Alkaline Phosphatase: 62 U/L (ref 38–126)
Anion gap: 8 (ref 5–15)
BUN: 24 mg/dL — ABNORMAL HIGH (ref 6–20)
CO2: 20 mmol/L — ABNORMAL LOW (ref 22–32)
Calcium: 8.8 mg/dL — ABNORMAL LOW (ref 8.9–10.3)
Chloride: 106 mmol/L (ref 101–111)
Creatinine, Ser: 1.51 mg/dL — ABNORMAL HIGH (ref 0.61–1.24)
GFR calc Af Amer: 48 mL/min — ABNORMAL LOW (ref >60)
GFR calc non Af Amer: 41 mL/min — ABNORMAL LOW (ref >60)
Glucose, Bld: 94 mg/dL (ref 65–99)
Potassium: 3.9 mmol/L (ref 3.5–5.1)
Sodium: 134 mmol/L — ABNORMAL LOW (ref 135–145)
Total Bilirubin: 0.9 mg/dL (ref 0.3–1.2)
Total Protein: 5.3 g/dL — ABNORMAL LOW (ref 6.5–8.1)

## 2017-08-04 LAB — PULMONARY FUNCTION TEST
FEF 25-75 Post: 1.53 L/sec
FEF 25-75 Pre: 1.7 L/sec
FEF2575-%Change-Post: -9 %
FEF2575-%Pred-Post: 98 %
FEF2575-%Pred-Pre: 108 %
FEV1-%Change-Post: -3 %
FEV1-%Pred-Post: 61 %
FEV1-%Pred-Pre: 63 %
FEV1-Post: 1.47 L
FEV1-Pre: 1.52 L
FEV1FVC-%Change-Post: 12 %
FEV1FVC-%Pred-Pre: 118 %
FEV6-%Change-Post: -13 %
FEV6-%Pred-Post: 49 %
FEV6-%Pred-Pre: 57 %
FEV6-Post: 1.56 L
FEV6-Pre: 1.81 L
FEV6FVC-%Pred-Post: 108 %
FEV6FVC-%Pred-Pre: 108 %
FVC-%Change-Post: -13 %
FVC-%Pred-Post: 45 %
FVC-%Pred-Pre: 53 %
FVC-Post: 1.56 L
FVC-Pre: 1.81 L
Post FEV1/FVC ratio: 94 %
Post FEV6/FVC ratio: 100 %
Pre FEV1/FVC ratio: 84 %
Pre FEV6/FVC Ratio: 100 %

## 2017-08-04 LAB — CBC
HEMATOCRIT: 42.2 % (ref 39.0–52.0)
HEMOGLOBIN: 14.4 g/dL (ref 13.0–17.0)
MCH: 34.6 pg — AB (ref 26.0–34.0)
MCHC: 34.1 g/dL (ref 30.0–36.0)
MCV: 101.4 fL — AB (ref 78.0–100.0)
Platelets: 91 10*3/uL — ABNORMAL LOW (ref 150–400)
RBC: 4.16 MIL/uL — AB (ref 4.22–5.81)
RDW: 14.6 % (ref 11.5–15.5)
WBC: 7 10*3/uL (ref 4.0–10.5)

## 2017-08-04 LAB — APTT
aPTT: 50 seconds — ABNORMAL HIGH (ref 24–36)
aPTT: 64 seconds — ABNORMAL HIGH (ref 24–36)

## 2017-08-04 LAB — PROTIME-INR
INR: 1.05
Prothrombin Time: 13.6 seconds (ref 11.4–15.2)

## 2017-08-04 LAB — PLATELET INHIBITION P2Y12: Platelet Function  P2Y12: 188 [PRU] — ABNORMAL LOW (ref 194–418)

## 2017-08-04 LAB — GLUCOSE, CAPILLARY: GLUCOSE-CAPILLARY: 95 mg/dL (ref 65–99)

## 2017-08-04 LAB — HEMOGLOBIN A1C
Hgb A1c MFr Bld: 5.3 % (ref 4.8–5.6)
Mean Plasma Glucose: 105.41 mg/dL

## 2017-08-04 LAB — TSH: TSH: 3.035 u[IU]/mL (ref 0.350–4.500)

## 2017-08-04 LAB — HEPARIN LEVEL (UNFRACTIONATED)
HEPARIN UNFRACTIONATED: 0.45 [IU]/mL (ref 0.30–0.70)
Heparin Unfractionated: 0.42 IU/mL (ref 0.30–0.70)
Heparin Unfractionated: 0.36 IU/mL (ref 0.30–0.70)

## 2017-08-04 MED ORDER — ALBUTEROL SULFATE (2.5 MG/3ML) 0.083% IN NEBU
2.5000 mg | INHALATION_SOLUTION | Freq: Once | RESPIRATORY_TRACT | Status: AC
  Administered 2017-08-04: 2.5 mg via RESPIRATORY_TRACT

## 2017-08-04 NOTE — Progress Notes (Signed)
ANTICOAGULATION CONSULT NOTE - Follow Up Consult  Pharmacy Consult for Heparin Indication: chest pain/ACS  Allergies  Allergen Reactions  . Lipitor [Atorvastatin] Other (See Comments)    Severe constipation    Patient Measurements: Height: 5\' 7"  (170.2 cm) Weight: 171 lb 11.2 oz (77.9 kg) IBW/kg (Calculated) : 66.1 Heparin Dosing Weight: 78.7 kg  Vital Signs: Temp: 97.6 F (36.4 C) (09/11 0439) Temp Source: Oral (09/11 0439) BP: 168/83 (09/11 0439) Pulse Rate: 71 (09/11 0439)  Labs:  Recent Labs  08/01/17 0609 08/01/17 1215  08/02/17 0700  08/02/17 2134 08/03/17 0206 08/04/17 0410  HGB 15.0  --   --   --   --   --   --  14.4  HCT 44.0  --   --   --   --   --   --  42.2  PLT 99*  --   --   --   --   --   --  91*  APTT  --  106*  < >  --   < > 74* 91* 50*  LABPROT 16.5*  --   --   --   --   --   --  13.6  INR 1.35  --   --   --   --   --   --  1.05  HEPARINUNFRC  --   --   --  0.87*  --   --  0.73* 0.42  CREATININE 1.60*  --   --   --   --   --  1.71*  --   TROPONINI 11.90* 9.78*  --   --   --   --   --   --   < > = values in this interval not displayed.  Estimated Creatinine Clearance: 31.1 mL/min (A) (by C-G formula based on SCr of 1.71 mg/dL (H)).   Medications:  Scheduled:  . allopurinol  300 mg Oral Daily  . aspirin EC  81 mg Oral Daily  . atenolol  25 mg Oral BID  . cholecalciferol  2,000 Units Oral Daily  . docusate sodium  100 mg Oral Daily  . finasteride  5 mg Oral Daily  . lisinopril  20 mg Oral Daily  . mirabegron ER  50 mg Oral Daily  . pantoprazole  40 mg Oral Daily  . rosuvastatin  40 mg Oral Daily  . sodium chloride flush  3 mL Intravenous Q12H   Infusions:  . sodium chloride    . heparin 800 Units/hr (08/03/17 2015)    Assessment: 82 yo M admitted with CP. S/p cath 9/10 which found severe LM stenosis, severe ostial stenosis of L circ, chronic total occlusion of RCA.  Pharmacy asked to restart heparin pending CABG evaluation.  Pt was  previously therapeutic on heparin at 800 units/hr.  Of note, pt on Eliquis PTA which can elevate heparin levels.  aPTTs are being used to guide heparin dosing at this time.  Last Eliquis dose was 9/7 at 1800.  AM aPTT subtherapeutic at 50s. Heparin level trending down and should be correlating with aPTT soon. Will continue to follow aPTT for now. CBC stable and no s/s bleeding noted.   Goal of Therapy:  aPTT goal 66-102 sec Heparin level 0.3-0.7 units/ml Monitor platelets by anticoagulation protocol: Yes   Plan:  Increase heparin gtt to 850 units/hr (previously supratherapeutic at 900 units/hr) Heparin level in 8 hrs Daily heparin level and CBC Monitor for s/s bleeding   Argie Ramming,  PharmD Clinical Pharmacist 08/04/17 5:31 AM

## 2017-08-04 NOTE — Progress Notes (Signed)
ANTICOAGULATION CONSULT NOTE - Follow Up Consult  Pharmacy Consult for Heparin Indication: chest pain/ACS  Allergies  Allergen Reactions  . Lipitor [Atorvastatin] Other (See Comments)    Severe constipation    Patient Measurements: Height: 5\' 7"  (170.2 cm) Weight: 171 lb 11.2 oz (77.9 kg) IBW/kg (Calculated) : 66.1 Heparin Dosing Weight: 78.7 kg  Vital Signs: Temp: 98.9 F (37.2 C) (09/11 1957) Temp Source: Oral (09/11 1957) BP: 134/51 (09/11 1957) Pulse Rate: 70 (09/11 1957)  Labs:  Recent Labs  08/03/17 0206 08/04/17 0410 08/04/17 1312 08/04/17 1955  HGB  --  14.4  --   --   HCT  --  42.2  --   --   PLT  --  91*  --   --   APTT 91* 50* 64*  --   LABPROT  --  13.6  --   --   INR  --  1.05  --   --   HEPARINUNFRC 0.73* 0.42 0.36 0.45  CREATININE 1.71* 1.51*  --   --     Estimated Creatinine Clearance: 35.3 mL/min (A) (by C-G formula based on SCr of 1.51 mg/dL (H)).  Assessment:  Anticoag: Heparin gtt for ACS.  Plan to restart post-cath pending TCTS eval.  aPTTs and HL correlating. - PM HL 0.45 in goal range tonight  Goal of Therapy:  Heparin level 0.3-0.7 units/ml Monitor platelets by anticoagulation protocol: Yes   Plan:  Continue Iv heparin at 950 units/hr Daily HL and CBC  Eilene Ghazi Stillinger 08/04/2017,9:13 PM

## 2017-08-04 NOTE — Care Management Note (Signed)
Case Management Note  Patient Details  Name: Stanley Robles MRN: [id number] Date of Birth: [birth year]  Subjective/Objective:  Pt presented for Chest Pain- s/p cardiac cath that revealed severe ostial left main disease as well as ostial circumflex with total right coronary artery disease. CVTS following to see if pt is a candidate for CABG. PT Recommendations for [facility] PT once stable.                  Action/Plan: CM will continue to monitor for disposition needs.   Expected Discharge Date:                  Expected Discharge Plan:  [place]  In-House Referral:  NA  Discharge planning Services  CM Consult  Post Acute Care Choice:    Choice offered to:     DME Arranged:    DME Agency:     HH Arranged:    HH Agency:     Status of Service:  In process, will continue to follow  If discussed at Long Length of Stay Meetings, dates discussed:    Additional Comments:  Bethena Roys, RN 08/04/2017, 3:35 PM

## 2017-08-04 NOTE — Evaluation (Signed)
Physical Therapy Evaluation Patient Details Name: Stanley Robles MRN: [id number] DOB: 11-06-1932 Today's Date: 08/04/2017   History of Present Illness  83 yo admitted with NSTEMI s/p cath which showed severe left main disease and total right coronary artery stenosis, potential CABG. PMhx:  CVA, sensorineural hearing loss, HLD, PVD, pAF, CKD stage 3, HTN, NSTEMI, chronic diastolic heart failure  Clinical Impression  Pt pleasant, HOH and eager to mobilize. Pt standing in room on arrival attempting to use urinal. Pt with assist of daughters and states he is aware that if he has CABG he would most likely require SNF and he is agreeable. Pt able to tolerate slow controlled gait for 300' but would benefit from RW use. Pt with decreased strength, balance, gait and transfers who will benefit from acute therapy to maximize mobility, function and independence. Will follow with anticipation of HHPT needed if D/C prior to CABG.   SpO2 87-95% on RA with gait HR 82-92 BP pre 152/60 BP post 175/53    Follow Up Recommendations Home health PT    Equipment Recommendations  Rolling walker with 5" wheels    Recommendations for Other Services       Precautions / Restrictions Precautions Precautions: Fall Restrictions Weight Bearing Restrictions: No      Mobility  Bed Mobility               General bed mobility comments: in chair on arrival   Transfers Overall transfer level: Modified independent               General transfer comment: pt with reliance on bil UE to rise and control descent to surface. Educated for inability to push if post CABG and pt unable to complete without bil UE  Ambulation/Gait Ambulation/Gait assistance: Min guard Ambulation Distance (Feet): 300 Feet Assistive device: Straight cane Gait Pattern/deviations: Step-through pattern;Decreased stride length   Gait velocity interpretation: Below normal speed for age/gender General Gait Details: pt with scissoring  x 2 with ability to recover but requires close guarding to prevent fall and would benefit from RW use. pt was able to walk 5 min without pain and reported no SOB  Stairs            Wheelchair Mobility    Modified Rankin (Stroke Patients Only)       Balance Overall balance assessment: Needs assistance   Sitting balance-Leahy Scale: Good       Standing balance-Leahy Scale: Fair                               Pertinent Vitals/Pain Pain Assessment: No/denies pain    Home Living Family/patient expects to be discharged to:: Private residence Living Arrangements: Alone Available Help at Discharge: Family;Available 24 hours/day Type of Home: House Home Access: Stairs to enter   [employer] of Steps: 3 Home Layout: One level Home Equipment: Cane - single point;Shower seat - built in;Wheelchair - manual      Prior Function Level of Independence: Needs assistance   Gait / Transfers Assistance Needed: pt walks with a cane  ADL's / Homemaking Assistance Needed: daughters assist with homemaking        Hand Dominance        Extremity/Trunk Assessment   Upper Extremity Assessment Upper Extremity Assessment: Generalized weakness    Lower Extremity Assessment Lower Extremity Assessment: Generalized weakness    Cervical / Trunk Assessment Cervical / Trunk Assessment: Normal  Communication  Communication: HOH  Cognition Arousal/Alertness: Awake/alert Behavior During Therapy: WFL for tasks assessed/performed Overall Cognitive Status: Within Functional Limits for tasks assessed                                        General Comments      Exercises     Assessment/Plan    PT Assessment Patient needs continued PT services  PT Problem List Decreased strength;Decreased mobility;Decreased activity tolerance;Decreased balance;Decreased knowledge of use of DME;Cardiopulmonary status limiting activity       PT Treatment  Interventions Gait training;Therapeutic exercise;Stair training;Functional mobility training;Balance training;Patient/family education;DME instruction;Therapeutic activities    PT Goals (Current goals can be found in the Care Plan section)  Acute Rehab PT Goals Patient Stated Goal: return home after surgery PT Goal Formulation: With patient Time For Goal Achievement: 08/18/17 Potential to Achieve Goals: Good    Frequency Min 3X/week   Barriers to discharge Decreased caregiver support      Co-evaluation               AM-PAC PT "6 Clicks" Daily Activity  Outcome Measure Difficulty turning over in bed (including adjusting bedclothes, sheets and blankets)?: A Little Difficulty moving from lying on back to sitting on the side of the bed? : A Little Difficulty sitting down on and standing up from a chair with arms (e.g., wheelchair, bedside commode, etc,.)?: A Little Help needed moving to and from a bed to chair (including a wheelchair)?: A Little Help needed walking in hospital room?: A Little Help needed climbing 3-5 steps with a railing? : A Little 6 Click Score: 18    End of Session Equipment Utilized During Treatment: Gait belt Activity Tolerance: Patient tolerated treatment well Patient left: in chair;with call bell/phone within reach Nurse Communication: Mobility status PT Visit Diagnosis: Difficulty in walking, not elsewhere classified (R26.2);Muscle weakness (generalized) (M62.81)    Time: 0940-7680 PT Time Calculation (min) (ACUTE ONLY): 19 min   Charges:   PT Evaluation $PT Eval Moderate Complexity: 1 Mod     PT G Codes:        Elwyn Reach, PT [phone]   Kiester B Valjean Ruppel 08/04/2017, 11:45 AM

## 2017-08-04 NOTE — Progress Notes (Signed)
1 Day Post-Op Procedure(s) (LRB): LEFT HEART CATH AND CORONARY ANGIOGRAPHY (N/A) Subjective: Patient able to ambulate in the hallway with physical therapy and appears to have functional reserve for CABG Patient's creatinine remains elevated and he has a mild metabolic acidosis PO2 is 82, creatinine 1.5, glucose 105 LFTs and protein are satisfactory Objective: Vital signs in last 24 hours: Temp:  [97.6 F (36.4 C)-98.2 F (36.8 C)] 98.2 F (36.8 C) (09/11 1358) Pulse Rate:  [62-71] 71 (09/11 0439) Cardiac Rhythm: Normal sinus rhythm (09/11 0930) Resp:  [18-19] 19 (09/11 0439) BP: (129-168)/(64-83) 132/67 (09/11 1358) SpO2:  [94 %-100 %] 95 % (09/11 1358) Weight:  [171 lb 11.2 oz (77.9 kg)] 171 lb 11.2 oz (77.9 kg) (09/11 0439)  Hemodynamic parameters for last 24 hours:  sinus  Intake/Output from previous day: 09/10 0701 - 09/11 0700 In: 360 [P.O.:290; I.V.:70] Out: 1600 [Urine:1600] Intake/Output this shift: Total I/O In: -  Out: 200 [Urine:200]  More alert after sedation from cath as resolved Lungs clear Extremities warm  Lab Results:  Recent Labs  08/04/17 0410  WBC 7.0  HGB 14.4  HCT 42.2  PLT 91*   BMET:  Recent Labs  08/03/17 0206 08/04/17 0410  NA 136 134*  K 3.8 3.9  CL 107 106  CO2 22 20*  GLUCOSE 109* 94  BUN 24* 24*  CREATININE 1.71* 1.51*  CALCIUM 8.7* 8.8*    PT/INR:  Recent Labs  08/04/17 0410  LABPROT 13.6  INR 1.05   ABG    Component Value Date/Time   PHART 7.487 (H) 08/04/2017 0500   HCO3 19.8 (L) 08/04/2017 0500   TCO2 20 02/27/2015 1219   ACIDBASEDEF 3.2 (H) 08/04/2017 0500   O2SAT 96.5 08/04/2017 0500   CBG (last 3)   Recent Labs  08/03/17 0738 08/04/17 0724  GLUCAP 118* 95    Assessment/Plan: S/P Procedure(s) (LRB): LEFT HEART CATH AND CORONARY ANGIOGRAPHY (N/A) If renal function remains stables will plan high risk CABG on Friday, September 14.   LOS: 4 days    Tharon Aquas Trigt III 08/04/2017

## 2017-08-04 NOTE — Care Management Important Message (Signed)
Important Message  Patient Details  Name: Stanley Robles MRN: [id number] Date of Birth: February 04, 1932   Medicare Important Message Given:  Yes    Nathen May 08/04/2017, 9:39 AM

## 2017-08-04 NOTE — Progress Notes (Addendum)
ANTICOAGULATION CONSULT NOTE - Follow Up Consult  Pharmacy Consult for Heparin Indication: chest pain/ACS  Allergies  Allergen Reactions  . Lipitor [Atorvastatin] Other (See Comments)    Severe constipation    Patient Measurements: Height: 5\' 7"  (170.2 cm) Weight: 171 lb 11.2 oz (77.9 kg) IBW/kg (Calculated) : 66.1 Heparin Dosing Weight: 78.7 kg  Vital Signs: Temp: 98.2 F (36.8 C) (09/11 1358) Temp Source: Oral (09/11 1358) BP: 132/67 (09/11 1358) Pulse Rate: 71 (09/11 0439)  Labs:  Recent Labs  08/03/17 0206 08/04/17 0410 08/04/17 1312  HGB  --  14.4  --   HCT  --  42.2  --   PLT  --  91*  --   APTT 91* 50* 64*  LABPROT  --  13.6  --   INR  --  1.05  --   HEPARINUNFRC 0.73* 0.42 0.36  CREATININE 1.71* 1.51*  --     Estimated Creatinine Clearance: 35.3 mL/min (A) (by C-G formula based on SCr of 1.51 mg/dL (H)).   Medications:  Scheduled:  . allopurinol  300 mg Oral Daily  . aspirin EC  81 mg Oral Daily  . atenolol  25 mg Oral BID  . cholecalciferol  2,000 Units Oral Daily  . docusate sodium  100 mg Oral Daily  . finasteride  5 mg Oral Daily  . lisinopril  20 mg Oral Daily  . mirabegron ER  50 mg Oral Daily  . pantoprazole  40 mg Oral Daily  . rosuvastatin  40 mg Oral Daily  . sodium chloride flush  3 mL Intravenous Q12H   Infusions:  . sodium chloride    . heparin 850 Units/hr (08/04/17 8250)    Assessment: 83 yo M admitted with CP.  Pt went to cardiac cath 9/10 and found severe LM stenosis, severe ostial stenosis of L circ, chronic total occlusion of RCA.  Pharmacy asked to restart heparin pending CABG evaluation.  Of note, pt on Eliquis PTA which can elevate heparin levels.  aPTTs were used to guide heparin dosing initially.  Now heparin levels and aPTTs are correlating.  Last Eliquis dose was 9/7 at 1800.   Goal of Therapy:  aPTT goal 66-102 sec Heparin level 0.3-0.7 units/ml Monitor platelets by anticoagulation protocol: Yes   Plan:   Increase heparin to 950 units/hr. Next heparin level in 6 hours Heparin level and CBC daily  [employer], Pharm.D., BCPS Clinical Pharmacist Pager: [phone] Clinical phone for 08/04/2017 from 8:30-4:00 is [phone]. After 4pm, please call Main [facility] [phone]) for assistance. 08/04/2017 2:15 PM

## 2017-08-05 ENCOUNTER — Inpatient Hospital Stay (HOSPITAL_COMMUNITY): Payer: Medicare Other

## 2017-08-05 LAB — CBC
HCT: 40.3 % (ref 39.0–52.0)
Hemoglobin: 13.5 g/dL (ref 13.0–17.0)
MCH: 33.9 pg (ref 26.0–34.0)
MCHC: 33.5 g/dL (ref 30.0–36.0)
MCV: 101.3 fL — ABNORMAL HIGH (ref 78.0–100.0)
PLATELETS: 84 10*3/uL — AB (ref 150–400)
RBC: 3.98 MIL/uL — AB (ref 4.22–5.81)
RDW: 14.5 % (ref 11.5–15.5)
WBC: 4.1 10*3/uL (ref 4.0–10.5)

## 2017-08-05 LAB — BASIC METABOLIC PANEL
Anion gap: 6 (ref 5–15)
BUN: 20 mg/dL (ref 6–20)
CO2: 22 mmol/L (ref 22–32)
Calcium: 8.4 mg/dL — ABNORMAL LOW (ref 8.9–10.3)
Chloride: 105 mmol/L (ref 101–111)
Creatinine, Ser: 1.65 mg/dL — ABNORMAL HIGH (ref 0.61–1.24)
GFR calc Af Amer: 43 mL/min — ABNORMAL LOW (ref >60)
GFR calc non Af Amer: 37 mL/min — ABNORMAL LOW (ref >60)
Glucose, Bld: 110 mg/dL — ABNORMAL HIGH (ref 65–99)
Potassium: 3.9 mmol/L (ref 3.5–5.1)
Sodium: 133 mmol/L — ABNORMAL LOW (ref 135–145)

## 2017-08-05 LAB — HEPARIN LEVEL (UNFRACTIONATED): HEPARIN UNFRACTIONATED: 0.49 [IU]/mL (ref 0.30–0.70)

## 2017-08-05 LAB — GLUCOSE, CAPILLARY
GLUCOSE-CAPILLARY: 105 mg/dL — AB (ref 65–99)
Glucose-Capillary: 120 mg/dL — ABNORMAL HIGH (ref 65–99)

## 2017-08-05 MED ORDER — ATENOLOL 25 MG PO TABS
25.0000 mg | ORAL_TABLET | Freq: Every day | ORAL | Status: DC
  Administered 2017-08-06: 25 mg via ORAL
  Filled 2017-08-05: qty 1

## 2017-08-05 MED FILL — Verapamil HCl IV Soln 2.5 MG/ML: INTRAVENOUS | Qty: 2 | Status: AC

## 2017-08-05 NOTE — Progress Notes (Signed)
ANTICOAGULATION CONSULT NOTE - Follow Up Consult  Pharmacy Consult for Heparin Indication: chest pain/ACS  Allergies  Allergen Reactions  . Lipitor [Atorvastatin] Other (See Comments)    Severe constipation    Patient Measurements: Height: 5\' 7"  (170.2 cm) Weight: 168 lb 1.6 oz (76.2 kg) IBW/kg (Calculated) : 66.1 Heparin Dosing Weight: 78.7 kg  Vital Signs: Temp: 98.4 F (36.9 C) (09/12 0555) Temp Source: Oral (09/12 0555) BP: 141/48 (09/12 0555) Pulse Rate: 70 (09/12 0555)  Labs:  Recent Labs  08/03/17 0206 08/04/17 0410 08/04/17 1312 08/04/17 1955 08/05/17 0408  HGB  --  14.4  --   --  13.5  HCT  --  42.2  --   --  40.3  PLT  --  91*  --   --  84*  APTT 91* 50* 64*  --   --   LABPROT  --  13.6  --   --   --   INR  --  1.05  --   --   --   HEPARINUNFRC 0.73* 0.42 0.36 0.45 0.49  CREATININE 1.71* 1.51*  --   --  1.65*    Estimated Creatinine Clearance: 32.3 mL/min (A) (by C-G formula based on SCr of 1.65 mg/dL (H)).   Medications:  Scheduled:  . allopurinol  300 mg Oral Daily  . aspirin EC  81 mg Oral Daily  . atenolol  25 mg Oral BID  . cholecalciferol  2,000 Units Oral Daily  . docusate sodium  100 mg Oral Daily  . finasteride  5 mg Oral Daily  . lisinopril  20 mg Oral Daily  . mirabegron ER  50 mg Oral Daily  . pantoprazole  40 mg Oral Daily  . rosuvastatin  40 mg Oral Daily  . sodium chloride flush  3 mL Intravenous Q12H   Infusions:  . sodium chloride    . heparin 950 Units/hr (08/04/17 2132)    Assessment: 83 yo M admitted with CP. S/p cath 9/10 which found severe LM stenosis, severe ostial stenosis of L circ, chronic total occlusion of RCA.  Plans for CABG on 9/14 and pharmacy dosing heparin  -heparin level = 0.49   Goal of Therapy:  aPTT goal 66-102 sec Heparin level 0.3-0.7 units/ml Monitor platelets by anticoagulation protocol: Yes   Plan:  No heparin changes needed Daily heparin level and CBC  [name], Pharm D 08/05/2017  8:46 AM

## 2017-08-05 NOTE — Evaluation (Signed)
Clinical/Bedside Swallow Evaluation Patient Details  Name: Stanley Robles MRN: [id number] Date of Birth: [birth year]  Today's Date: 08/05/2017 Time: SLP Start Time (ACUTE ONLY): 0940 SLP Stop Time (ACUTE ONLY): 1001 SLP Time Calculation (min) (ACUTE ONLY): 21 min  Past Medical History:  Past Medical History:  Diagnosis Date  . BPH (benign prostatic hypertrophy)   . Cancer ([place])    CANCEROUS MOLE REMOVED FROM BACK - SEVERAL YRS AGO  . Chronic kidney disease stage 3   . Coronary artery disease    DR. TILLEY IS PT'S CARDIOLGIST  . GERD (gastroesophageal reflux disease)   . Gout   . Hyperlipidemia   . Hypertensive heart disease   . Incontinence of urine   . Memory loss   . Myocardial infarction ([place]) 1994  . Osteoarthritis   . Paroxysmal atrial fibrillation ([facility])   . Peripheral vascular disease ([place])   . Personal history of transient ischemic attack (TIA) and cerebral infarction without residual deficit   . Right pontine stroke ([place]) 02/27/2015  . Tooth disease    Past Surgical History:  Past Surgical History:  Procedure Laterality Date  . CARDIAC CATHETERIZATION  07/21/2005  . HERNIA REPAIR  12/26/2002   BILATERAL INGUINAL HERNIA REPAIR  . LEFT HEART CATH AND CORONARY ANGIOGRAPHY N/A 08/03/2017   Procedure: LEFT HEART CATH AND CORONARY ANGIOGRAPHY;  Surgeon: Sherren Mocha, MD;  Location: [facility];  Service: Cardiovascular;  Laterality: N/A;  . MASTOID SURGERY X 5    . PROSTATE SURGERY    . TOTAL KNEE ARTHROPLASTY Left 03/28/2013   Procedure: LEFT TOTAL KNEE ARTHROPLASTY;  Surgeon: Gearlean Alf, MD;  Location: [facility];  Service: Orthopedics;  Laterality: Left;   HPI:  Pt is an 83 yo male admitted with NSTEMI s/p cath which showed severe left main disease and total right coronary artery stenosis, potential CABG planned for 9/14. 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  Most recent MBS in 2016 recommended regular diet and thin liquids with chin tuck and small sips to reduce episodes of aspiration. Pt and his dtr say that he has not been using a chin tuck at home anymore but that he also has not had PNA.   Assessment / Plan / Recommendation Clinical Impression  Pt's mastication and a/p transit is prolonged, requiring extra time and liquid washes to clear moderate amounts of oral residue. Immediate coughing is observed following mixed consistency boluses, such as when he uses these liquid washes or when he took multiple pills with a liquid wash. Otherwise no overt coughing is observed, but he does have a h/o silent aspiration. I suspect that pt has been intermittently aspirating at home, given he and his daughter's reports of frequent coughing with intake. Either his cough has become more protective or he has been able to tolerate this intermittent aspiration though, because he also says that he has not gotten PNA over the last few years since his stroke. Given the above and his impending surgery this Friday, recommend to proceed with MBS to better assess current level of function given the potential for decreased ability to compensate post-procedure. Will plan for this afternoon - until then, recommend Dys 3 diet and thin liquids with additional precautions including no straws and no mixed consistencies. SLP Visit Diagnosis: Dysphagia, oropharyngeal phase (R13.12)    Aspiration Risk  Moderate aspiration risk    Diet Recommendation Dysphagia 3 (Mech soft);Thin liquid  Liquid Administration via: Cup;No straw Medication Administration: Whole meds with puree Supervision: Patient able to self feed;Intermittent supervision to cue for compensatory strategies Compensations: Slow rate;Small sips/bites Postural Changes: Seated upright at 90 degrees;Remain upright for at least 30 minutes after po intake    Other  Recommendations Oral Care Recommendations: Oral care BID    Follow up Recommendations  (tba)      Frequency and Duration min 2x/week  2 weeks       Prognosis Prognosis for Safe Diet Advancement: Good      Swallow Study   General HPI: Pt is an 83 yo male admitted with NSTEMI s/p cath which showed severe left main disease and total right coronary artery stenosis, potential CABG planned for 9/14. 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 Most recent MBS in 2016 recommended regular diet and thin liquids with chin tuck and small sips to reduce episodes of aspiration. Pt and his dtr say that he has not been using a chin tuck at home anymore but that he also has not had PNA. Type of Study: Bedside Swallow Evaluation Previous Swallow Assessment: see HPI Diet Prior to this Study: Regular;Thin liquids Temperature Spikes Noted: No Respiratory Status: Room air History of Recent Intubation: No Behavior/Cognition: Alert;Cooperative;Pleasant mood;Other (Comment) (HOH) Oral Care Completed by SLP: No Oral Cavity - Dentition: Adequate natural dentition Vision: Functional for self-feeding Self-Feeding Abilities: Able to feed self Patient Positioning: Upright in chair Baseline Vocal Quality: Normal Volitional Cough: Strong    Oral/Motor/Sensory Function     Ice Chips Ice chips: Not tested   Thin Liquid Thin Liquid: Impaired Presentation: Cup;Self Fed;Straw Pharyngeal  Phase Impairments: Cough - Immediate    Nectar Thick Nectar Thick Liquid: Not tested   Honey Thick Honey Thick Liquid: Not tested   Puree Puree: Within functional limits Presentation: Self Fed;Spoon   Solid   GO   Solid: Impaired Presentation: Self Fed Oral Phase Functional Implications: Impaired mastication;Oral residue        Germain Osgood 08/05/2017,10:23 AM   Germain Osgood, M.A. CCC-SLP [phone]

## 2017-08-05 NOTE — Progress Notes (Signed)
Subjective:  He has had no recurrent chest discomfort overnight on intravenous heparin.  He has passed functional evaluation for bypass grafting and this is now planned for Friday which will be a high risk procedure.  Objective:  Vital Signs in the last 24 hours: BP (!) 141/48 (BP Location: Left Arm)   Pulse 70   Temp 98.4 F (36.9 C) (Oral)   Resp 20   Ht 5\' 7"  (1.702 m)   Wt 76.2 kg (168 lb 1.6 oz)   SpO2 98%   BMI 26.33 kg/m   Physical Exam: Pleasant elderly male in no acute distress Lungs:  Clear Cardiac:  Regular rhythm, normal S1 and S2, no S3 Abdomen:  Soft, nontender, no masses Extremities:  No edema present  Intake/Output from previous day: 09/11 0701 - 09/12 0700 In: 95 [I.V.:95] Out: 275 [Urine:275]  Weight Filed Weights   08/03/17 0500 08/04/17 0439 08/05/17 0555  Weight: 78.7 kg (173 lb 6.4 oz) 77.9 kg (171 lb 11.2 oz) 76.2 kg (168 lb 1.6 oz)    Lab Results: Basic Metabolic Panel:  Recent Labs  08/04/17 0410 08/05/17 0408  NA 134* 133*  K 3.9 3.9  CL 106 105  CO2 20* 22  GLUCOSE 94 110*  BUN 24* 20  CREATININE 1.51* 1.65*   CBC:  Recent Labs  08/04/17 0410 08/05/17 0408  WBC 7.0 4.1  HGB 14.4 13.5  HCT 42.2 40.3  MCV 101.4* 101.3*  PLT 91* 84*   Cardiac Enzymes: Telemetry: Sinus rhythm   Assessment/Plan:  1.  Non-STEMIWith severe left main disease and total right coronary artery stenosisAs currently pain-free 2.  Chronic kidney disease stage 3-4 3.  Paroxysmal atrial fibrillation currently in sinus rhythm 4.  Long-term use of anticoagulation 5.  Thrombocytopenia  Recommendations:  Plans for surgery are scheduled on Friday.  Discussed high risk nature with patient but he is excepting an agreeable to proceed.  Continue heparin.  Platelet count has dropped some and will need to watch this on heparin.   Kerry Hough  MD [facility]  08/05/2017, 9:12 AM

## 2017-08-05 NOTE — Progress Notes (Signed)
Subjective:  To be evaluated by physical therapy for functional status for surgery and awaiting pulmonary function test.  Renal function is stable today.  No recurrent chest pain.  Objective:  Vital Signs in the last 24 hours: BP (!) 134/51 BP Location: Left Arm)   Pulse 70   Temp 98.4 F (36.9 C) (Oral)   Resp 20   Ht 5\' 7"  (1.702 m)   Wt 77.9.2 kg (168 lb 1.6 oz)   SpO2 98%   BMI 26.33 kg/m   Physical Exam: Pleasant elderly male in no acute distress Lungs:  Clear Cardiac:  Regular rhythm, normal S1 and S2, no S3 Abdomen:  Soft, nontender, no masses Extremities: Catheterization site clean and dry  From previous day: 09/11 0701 - 09/12 0700 In: 95 [I.V.:95] Out: 275 [Urine:275]  Weight  08/03/17 0500 08/04/17 0439  Weight: 78.7 kg (173 lb 6.4 oz) 77.9 kg (171 lb 11.2 oz)    Lab Results: Basic Metabolic Panel:  Recent Labs  08/04/17 0410  NA 134*  K 3.9  CL 106  CO2 20*  GLUCOSE 94  BUN 24*  CREATININE 1.51*   CBC:  Recent Labs  08/04/17 0410  WBC 7.0  HGB 14.4  HCT 42.2  MCV 101.4*  PLT 91*   Telemetry: Sinus rhythm   Assessment/Plan:  1.  Non-STEMIWith severe left main disease and total right coronary artery stenosis 2.  Chronic kidney disease stage 3-4 3.  Paroxysmal atrial fibrillation currently in sinus rhythm 4.  Long-term use of anticoagulation  Recommendations:  Awaiting functional testing prior to bypass.     Kerry Hough  MD [facility]  08/05/2017, 9:14 AM

## 2017-08-05 NOTE — Progress Notes (Signed)
Modified Barium Swallow Progress Note  Patient Details  Name: Stanley Robles MRN: [id number] Date of Birth: Sep 24, 1932  Today's Date: 08/05/2017  Modified Barium Swallow completed.  Full report located under Chart Review in the Imaging Section.  Brief recommendations include the following:  Clinical Impression  Pt has a mild oropharyngeal and cervical esophageal dysphagia, suspected to be more chronic in nature given his h/o dysphagia from prior CVA and h/o GERD. He has intermittent premature spillage of thin liquids that is mostly seen when he takes larger, consecutive boluses. There was one instance in which a small amount of thin liquid was silently spilled directly into his airway before the swallow when taking sequential straw sips. No other aspiration was observed, but would suspect that coughing episodes observed clinically may be related to larger volumes of aspiration. Pt also had reduced relaxation of the UES that allowed for small amounts of backflow into the pharynx, but nothing that compromised the airway. Recommend to continue current Dys 3 diet and thin liquids with emphasis no single cup sips and avoiding mixed consistencies. Will continue to follow pt through his surgery.    Swallow Evaluation Recommendations       SLP Diet Recommendations: Dysphagia 3 (Mech soft) solids;Thin liquid   Liquid Administration via: Cup;No straw   Medication Administration: Whole meds with puree   Supervision: Patient able to self feed;Intermittent supervision to cue for compensatory strategies   Compensations: Slow rate;Small sips/bites;Follow solids with liquid   Postural Changes: Remain semi-upright after after feeds/meals (Comment);Seated upright at 90 degrees   Oral Care Recommendations: Oral care BID        Germain Osgood 08/05/2017,3:11 PM   Germain Osgood, M.A. CCC-SLP [phone]

## 2017-08-06 ENCOUNTER — Inpatient Hospital Stay (HOSPITAL_COMMUNITY): Payer: Medicare Other

## 2017-08-06 DIAGNOSIS — I129 Hypertensive chronic kidney disease with stage 1 through stage 4 chronic kidney disease, or unspecified chronic kidney disease: Secondary | ICD-10-CM

## 2017-08-06 DIAGNOSIS — I252 Old myocardial infarction: Secondary | ICD-10-CM

## 2017-08-06 DIAGNOSIS — D696 Thrombocytopenia, unspecified: Secondary | ICD-10-CM

## 2017-08-06 DIAGNOSIS — Z01810 Encounter for preprocedural cardiovascular examination: Secondary | ICD-10-CM

## 2017-08-06 DIAGNOSIS — Z87891 Personal history of nicotine dependence: Secondary | ICD-10-CM

## 2017-08-06 DIAGNOSIS — I25110 Atherosclerotic heart disease of native coronary artery with unstable angina pectoris: Secondary | ICD-10-CM

## 2017-08-06 LAB — BASIC METABOLIC PANEL
Anion gap: 8 (ref 5–15)
BUN: 23 mg/dL — ABNORMAL HIGH (ref 6–20)
CO2: 22 mmol/L (ref 22–32)
Calcium: 8.3 mg/dL — ABNORMAL LOW (ref 8.9–10.3)
Chloride: 105 mmol/L (ref 101–111)
Creatinine, Ser: 1.78 mg/dL — ABNORMAL HIGH (ref 0.61–1.24)
GFR calc Af Amer: 39 mL/min — ABNORMAL LOW (ref >60)
GFR calc non Af Amer: 34 mL/min — ABNORMAL LOW (ref >60)
Glucose, Bld: 99 mg/dL (ref 65–99)
Potassium: 3.9 mmol/L (ref 3.5–5.1)
Sodium: 135 mmol/L (ref 135–145)

## 2017-08-06 LAB — CBC
HEMATOCRIT: 38.3 % — AB (ref 39.0–52.0)
Hemoglobin: 12.8 g/dL — ABNORMAL LOW (ref 13.0–17.0)
MCH: 33.9 pg (ref 26.0–34.0)
MCHC: 33.4 g/dL (ref 30.0–36.0)
MCV: 101.3 fL — ABNORMAL HIGH (ref 78.0–100.0)
PLATELETS: 78 10*3/uL — AB (ref 150–400)
RBC: 3.78 MIL/uL — ABNORMAL LOW (ref 4.22–5.81)
RDW: 14.7 % (ref 11.5–15.5)
WBC: 3.8 10*3/uL — AB (ref 4.0–10.5)

## 2017-08-06 LAB — VAS US DOPPLER PRE CABG
LEFT ECA DIAS: 0 cm/s
LEFT VERTEBRAL DIAS: -13 cm/s
Left CCA dist dias: -9 cm/s
Left CCA dist sys: -71 cm/s
Left CCA prox dias: -8 cm/s
Left CCA prox sys: -82 cm/s
Left ICA dist dias: -17 cm/s
Left ICA dist sys: -128 cm/s
Left ICA prox dias: -13 cm/s
Left ICA prox sys: -121 cm/s
RIGHT ECA DIAS: 0 cm/s
RIGHT VERTEBRAL DIAS: -4 cm/s
Right CCA prox dias: 7 cm/s
Right CCA prox sys: 74 cm/s
Right cca dist sys: -67 cm/s

## 2017-08-06 LAB — CBC WITH DIFFERENTIAL/PLATELET
Basophils Absolute: 0 10*3/uL (ref 0.0–0.1)
Basophils Relative: 0 %
EOS ABS: 0.1 10*3/uL (ref 0.0–0.7)
Eosinophils Relative: 3 %
HCT: 45.9 % (ref 39.0–52.0)
HEMOGLOBIN: 15.9 g/dL (ref 13.0–17.0)
Lymphocytes Relative: 26 %
Lymphs Abs: 1.2 10*3/uL (ref 0.7–4.0)
MCH: 35.5 pg — ABNORMAL HIGH (ref 26.0–34.0)
MCHC: 34.6 g/dL (ref 30.0–36.0)
MCV: 102.5 fL — ABNORMAL HIGH (ref 78.0–100.0)
Monocytes Absolute: 0.7 10*3/uL (ref 0.1–1.0)
Monocytes Relative: 15 %
NEUTROS PCT: 56 %
Neutro Abs: 2.6 10*3/uL (ref 1.7–7.7)
Platelets: 95 10*3/uL — ABNORMAL LOW (ref 150–400)
RBC: 4.48 MIL/uL (ref 4.22–5.81)
RDW: 15.1 % (ref 11.5–15.5)
WBC: 4.7 10*3/uL (ref 4.0–10.5)

## 2017-08-06 LAB — HEPARIN LEVEL (UNFRACTIONATED): HEPARIN UNFRACTIONATED: 0.43 [IU]/mL (ref 0.30–0.70)

## 2017-08-06 LAB — SAVE SMEAR

## 2017-08-06 LAB — PREPARE RBC (CROSSMATCH)

## 2017-08-06 LAB — SURGICAL PCR SCREEN
MRSA, PCR: NEGATIVE
Staphylococcus aureus: NEGATIVE

## 2017-08-06 LAB — GLUCOSE, CAPILLARY: GLUCOSE-CAPILLARY: 94 mg/dL (ref 65–99)

## 2017-08-06 LAB — ABO/RH: ABO/RH(D): O POS

## 2017-08-06 MED ORDER — TRANEXAMIC ACID (OHS) PUMP PRIME SOLUTION
2.0000 mg/kg | INTRAVENOUS | Status: DC
Start: 2017-08-07 — End: 2017-08-07
  Filled 2017-08-06: qty 1.52

## 2017-08-06 MED ORDER — SODIUM CHLORIDE 0.9 % IV SOLN
INTRAVENOUS | Status: DC
  Filled 2017-08-06: qty 30

## 2017-08-06 MED ORDER — VANCOMYCIN HCL 10 G IV SOLR
1250.0000 mg | INTRAVENOUS | Status: AC
  Administered 2017-08-07: 1250 mg via INTRAVENOUS
  Filled 2017-08-06: qty 1250

## 2017-08-06 MED ORDER — TEMAZEPAM 15 MG PO CAPS: 15.0000 mg | ORAL_CAPSULE | Freq: Once | ORAL | Status: DC | PRN

## 2017-08-06 MED ORDER — DEXTROSE 5 % IV SOLN
1.5000 g | INTRAVENOUS | Status: AC
  Administered 2017-08-07: .75 g via INTRAVENOUS
  Administered 2017-08-07: 1.5 g via INTRAVENOUS
  Filled 2017-08-06: qty 1.5

## 2017-08-06 MED ORDER — CHLORHEXIDINE GLUCONATE CLOTH 2 % EX PADS
6.0000 | MEDICATED_PAD | Freq: Once | CUTANEOUS | Status: AC
  Administered 2017-08-07: 6 via TOPICAL

## 2017-08-06 MED ORDER — SODIUM CHLORIDE 0.9 % IV SOLN
INTRAVENOUS | Status: AC
  Administered 2017-08-07: 1 [IU]/h via INTRAVENOUS
  Filled 2017-08-06: qty 1

## 2017-08-06 MED ORDER — CHLORHEXIDINE GLUCONATE 0.12 % MT SOLN
15.0000 mL | Freq: Once | OROMUCOSAL | Status: AC
  Administered 2017-08-07: 15 mL via OROMUCOSAL
  Filled 2017-08-06: qty 15

## 2017-08-06 MED ORDER — MAGNESIUM SULFATE 50 % IJ SOLN
40.0000 meq | INTRAMUSCULAR | Status: DC
  Filled 2017-08-06: qty 10

## 2017-08-06 MED ORDER — DEXTROSE 5 % IV SOLN
750.0000 mg | INTRAVENOUS | Status: DC
  Filled 2017-08-06: qty 750

## 2017-08-06 MED ORDER — TRANEXAMIC ACID (OHS) BOLUS VIA INFUSION
15.0000 mg/kg | INTRAVENOUS | Status: AC
  Administered 2017-08-07: 1143 mg via INTRAVENOUS
  Filled 2017-08-06: qty 1143

## 2017-08-06 MED ORDER — BISACODYL 5 MG PO TBEC
5.0000 mg | DELAYED_RELEASE_TABLET | Freq: Once | ORAL | Status: DC
  Filled 2017-08-06: qty 1

## 2017-08-06 MED ORDER — PLASMA-LYTE 148 IV SOLN
INTRAVENOUS | Status: AC
  Administered 2017-08-07: 500 mL
  Filled 2017-08-06: qty 2.5

## 2017-08-06 MED ORDER — POTASSIUM CHLORIDE 2 MEQ/ML IV SOLN
80.0000 meq | INTRAVENOUS | Status: DC
  Filled 2017-08-06: qty 40

## 2017-08-06 MED ORDER — DIAZEPAM 2 MG PO TABS
2.0000 mg | ORAL_TABLET | Freq: Once | ORAL | Status: AC
  Administered 2017-08-07: 2 mg via ORAL
  Filled 2017-08-06: qty 1

## 2017-08-06 MED ORDER — CHLORHEXIDINE GLUCONATE CLOTH 2 % EX PADS
6.0000 | MEDICATED_PAD | Freq: Once | CUTANEOUS | Status: AC
  Administered 2017-08-06: 6 via TOPICAL

## 2017-08-06 MED ORDER — SODIUM CHLORIDE 0.9 % IV SOLN
1.5000 mg/kg/h | INTRAVENOUS | Status: AC
  Administered 2017-08-07: 1.5 mg/kg/h via INTRAVENOUS
  Filled 2017-08-06: qty 25

## 2017-08-06 MED ORDER — SODIUM CHLORIDE 0.9 % IV SOLN
30.0000 ug/min | INTRAVENOUS | Status: AC
  Administered 2017-08-07: 50 ug/min via INTRAVENOUS
  Filled 2017-08-06: qty 2

## 2017-08-06 MED ORDER — NITROGLYCERIN IN D5W 200-5 MCG/ML-% IV SOLN
2.0000 ug/min | INTRAVENOUS | Status: AC
  Administered 2017-08-07: 5 ug/min via INTRAVENOUS
  Filled 2017-08-06: qty 250

## 2017-08-06 MED ORDER — DEXMEDETOMIDINE HCL IN NACL 400 MCG/100ML IV SOLN
0.1000 ug/kg/h | INTRAVENOUS | Status: AC
  Administered 2017-08-07: .3 ug/kg/h via INTRAVENOUS
  Filled 2017-08-06: qty 100

## 2017-08-06 MED ORDER — METOPROLOL TARTRATE 12.5 MG HALF TABLET
12.5000 mg | ORAL_TABLET | Freq: Once | ORAL | Status: AC
  Administered 2017-08-07: 12.5 mg via ORAL
  Filled 2017-08-06: qty 1

## 2017-08-06 MED ORDER — EPINEPHRINE PF 1 MG/ML IJ SOLN
0.0000 ug/min | INTRAVENOUS | Status: DC
  Filled 2017-08-06: qty 4

## 2017-08-06 MED ORDER — DOPAMINE-DEXTROSE 3.2-5 MG/ML-% IV SOLN
0.0000 ug/kg/min | INTRAVENOUS | Status: AC
  Administered 2017-08-07: 2.5 ug/kg/min via INTRAVENOUS
  Filled 2017-08-06: qty 250

## 2017-08-06 NOTE — Progress Notes (Signed)
Received BPA alert for HIT evaluation on Stanley Robles when entering Heart Pack for [facility] on 08/07/17. Called and discussed with Dr. Prescott Gum to determine if we should proceed with heparin or without. 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   Sloan Leiter, PharmD, BCPS Clinical Pharmacist Clinical phone 08/06/2017 until 11PM - [phone] After hours, please call #[phone] 08/06/2017, 5:14 PM

## 2017-08-06 NOTE — Progress Notes (Signed)
Pre-op Cardiac Surgery  Carotid Findings:   Findings are consistent with a 1-39 percent stenosis involving the right internal carotid artery and the left internal carotid artery. The vertebral arteries demonstrate antegrade flow. High resistive waveforms were noted in the right and left internal carotid arteries.  Upper Extremity Right Left  Brachial Pressures 132  Triphasic 129  Triphasic  Radial Waveforms Triphasic Triphasic  Ulnar Waveforms Triphasic Triphasic  Palmar Arch (Allen's Test) Palmar waveforms remain within normal limits with radial compression and are diminished greater than fifty percent with ulnar compression. Palmar waveforms remain within normal limits with radial compression and are obliterated with ulnar compression.    Lower  Extremity Right Left  Dorsalis Pedis >254 135  Posterior Tibial 122 133  Ankle/Brachial Indices 0.92 1.02   Findings:   Right ABI of 0.92 is suggestive of mild arterial occlusive disease at rest. Left ABI of 1.02 is suggestive of arterial flow within normal limits at rest.  08/06/17 12:08 PM Carlos Levering RVT

## 2017-08-06 NOTE — Progress Notes (Signed)
[id number] Assisted pt to bed for IV restart. Gave pt [facility] booklet, care guide and wrote down how to view pre op video. Encouraged pt to watch pre op video later when daughters here. Encouraged pt to get used to IS. Gave IS and pt could reach 750 ml. Pt stated that he lives alone and has daughters who help with his care. Stated he would like to go to Rehab after surgery as they work and would not be available 24/7. Discussed with pt importance of walking and IS after surgery. Discussed sternal precautions. Will continue to follow. Graylon Good RN BSN 08/06/2017 2:28 PM

## 2017-08-06 NOTE — Consult Note (Signed)
Referring MD: Dr. Tharon Aquas Tright  PCP:  Glenda Chroman, MD   Reason for Referral: Hematology consultation re-thrombocytopenia preop coronary bypass surgery  Chief Complaint  Patient presents with  . Chest Pain    HPI:  83 year old man with known coronary artery disease, paroxysmal atrial fibrillation on chronic Eliquis anticoagulation, stage III renal insufficiency.  He has cerebrovascular disease status post a pontine stroke in 2016 which has left him with some cognitive deficits and swallowing dysfunction.  He was admitted on July 31, 2017 with chest pain.  Peak troponin  11.9.  Cardiac cath showed 75% RCA lesion, 95% proximal LAD lesion, mild to moderate mitral regurgitation. His platelet count has fluctuated between 77,000 and 119,000.  Review of available data back to September 2012 reveals that he has had chronic thrombocytopenia with platelet counts as low as 77,000.  He has not been anemic.  White count and differential are normal. He was not on an antiplatelet agent prior to admission but has received some doses of aspirin during this admission.  He is currently on heparin awaiting a decision on surgery. He is on a number of other medications none of which are the usual offenders with respect to thrombocytopenia. Most recent surgery was a left total knee replacement on Mar 28, 2014.  Although estimated blood loss at the time of the procedure was only 50 mL's, hemoglobin did fall from 15.8 to 8.5.  Platelet count was in the range of 83-149,000.  It is not clear from the record whether he required platelet transfusion. He states that he cannot remember that he has had any excessive bleeding with any prior surgical procedures. He has used steroids intermittently for gout flares. He denies any family history of any bleeding disorders.  He is currently being considered for urgent coronary bypass surgery.   Past Medical History:  Diagnosis Date  . BPH (benign prostatic hypertrophy)    . 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 Chronic kidney disease stage 3   . Coronary artery disease    DR. TILLEY IS PT'S CARDIOLGIST  . GERD (gastroesophageal reflux disease)   . Gout   . Hyperlipidemia   . Hypertensive heart disease   . Incontinence of urine   . Memory loss   . Myocardial infarction ([place]) 1994  . Osteoarthritis   . Paroxysmal atrial fibrillation ([facility])   . Peripheral vascular disease ([place])   . Personal history of transient ischemic attack (TIA) and cerebral infarction without residual deficit   . Right pontine stroke ([place]) 02/27/2015  . Tooth disease   :  Past Surgical History:  Procedure Laterality Date  . CARDIAC CATHETERIZATION  07/21/2005  . HERNIA REPAIR  12/26/2002   BILATERAL INGUINAL HERNIA REPAIR  . LEFT HEART CATH AND CORONARY ANGIOGRAPHY N/A 08/03/2017   Procedure: LEFT HEART CATH AND CORONARY ANGIOGRAPHY;  Surgeon: Sherren Mocha, MD;  Location: [facility];  Service: Cardiovascular;  Laterality: N/A;  . MASTOID SURGERY X 5    . PROSTATE SURGERY    . TOTAL KNEE ARTHROPLASTY Left 03/28/2013   Procedure: LEFT TOTAL KNEE ARTHROPLASTY;  Surgeon: Gearlean Alf, MD;  Location: [facility];  Service: Orthopedics;  Laterality: Left;  :  . allopurinol  300 mg Oral Daily  . aspirin EC  81 mg Oral Daily  . atenolol  25 mg Oral Daily  . cholecalciferol  2,000 Units Oral Daily  . docusate sodium  100 mg Oral  Daily  . finasteride  5 mg Oral Daily  . lisinopril  20 mg Oral Daily  . mirabegron ER  50 mg Oral Daily  . pantoprazole  40 mg Oral Daily  . rosuvastatin  40 mg Oral Daily  . sodium chloride flush  3 mL Intravenous Q12H  :  Allergies  Allergen Reactions  . Lipitor [Atorvastatin] Other (See Comments)    Severe constipation  :  Family History  Problem Relation Age of Onset  . Hypertension Father   . Hypertension Mother   . Hyperlipidemia Mother   . Cancer Brother   :  Social History   Social History  .  Marital status: Widowed    Spouse name: N/A  . Number of children: 3  . Years of education: 64   Occupational History  . retired     Regulatory affairs officer   Social History Main Topics  . Smoking status: Former Research scientist (life sciences)  . Smokeless tobacco: Never Used     Comment: QUIT SMOKING PRIOR TO 1980  . Alcohol use No  . Drug use: No  . Sexual activity: Not on file   Other Topics Concern  . Not on file   Social History Narrative   Retired Academic librarian and body.  Widower. Has sig other   Right handed   Caffeine use - coffee 1 cup daily  :  ROS: Decreased hearing.  Trouble swallowing.  Currently no chest pain at rest.  No dyspnea. See HPI. ROS otherwise unremarkable.  Vitals: Vitals:   08/05/17 2021 08/06/17 0511  BP: (!) 125/53 104/62  Pulse: 62 61  Resp: 19 17  Temp: 97.9 F (36.6 C) 98 F (36.7 C)  SpO2: 92% 91%    PHYSICAL EXAM: General appearance: Elderly man who is hard of hearing.  Drooling from the right side of his mouth. HEENT: Pupils equal round reactive to light.  Pharynx no erythema or exudate. Lymph Nodes: No cervical, supraclavicular, or axillary adenopathy Resp: Lungs overall clear to auscultation and resonant to percussion Cardio: Regular cardiac rhythm without murmur gallop or rub Vascular: Carotid pulses 1+ no bruits Breasts: GI: Abdomen is soft and nontender.  No obvious mass or organomegaly GU: Extremities: No edema or calf tenderness Neurologic: Alert and oriented.  Mild right facial droop.  Decreased hearing.  Wearing a hearing aid.  Motor strength 5/5. Skin: Scattered ecchymoses on the skin of both forearms.  Skin is very thin.  Labs: Liver functions normal except for decreased albumin.  Bilirubin 0.9.   Recent Labs  08/06/17 0224 08/06/17 1357  WBC 3.8* 4.7  HGB 12.8* 15.9  HCT 38.3* 45.9  PLT 78* 95*    Recent Labs  08/05/17 0408 08/06/17 0224  NA 133* 135  K 3.9 3.9  CL 105 105  CO2 22 22  GLUCOSE 110* 99  BUN 20 23*  CREATININE 1.65*  1.78*  CALCIUM 8.4* 8.3*  Creatinine range 1.5-1.8. Urine large positive hemoglobin, 6-30 red cells, white cells too numerous to count  Blood smear review:  Normochromic normocytic red cells.  No spherocytes, schistocytes, or polychromasia.  Mature neutrophils and lymphocytes.  Mild decrease in platelets.  8-10 per high-power field which correlates well with the machine count.  Normal morphology.  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  Unlikely to be a myelodysplastic syndrome in the absence of anemia.  Normal white count and differential, no abnormal early myeloid cells on peripheral blood film review.  Recommendation: Proceed with  surgery as planned. Platelet transfusion only as indicated by excessive intraoperative bleeding. Try to get him off heparin as soon as reasonable postop. Avoid dual antiplatelet agents.   Murriel Hopper, MD, [place]  Hematology-Oncology/Internal Medicine  08/06/2017, 3:18 PM

## 2017-08-06 NOTE — Progress Notes (Signed)
Subjective:  No recurrent chest pain overnight.  He had mild thrombocytopenia on admission but platelets of dropped further since admission.  Renal function slightly worse today but similar to baseline when he came in.  Objective:  Vital Signs in the last 24 hours: BP 104/62 (BP Location: Right Arm)   Pulse 61   Temp 98 F (36.7 C) (Oral)   Resp 17   Ht 5\' 7"  (1.702 m)   Wt 76.2 kg (168 lb)   SpO2 91%   BMI 26.31 kg/m   Physical Exam: Pleasant elderly male in no acute distress Lungs:  Clear Cardiac:  Regular rhythm, normal S1 and S2, no S3 Abdomen:  Soft, nontender, no masses Extremities:  No edema present  Intake/Output from previous day: 09/12 0701 - 09/13 0700 In: 568 [P.O.:340; I.V.:228] Out: 925 [Urine:925]  Weight Filed Weights   08/04/17 0439 08/05/17 0555 08/06/17 0511  Weight: 77.9 kg (171 lb 11.2 oz) 76.2 kg (168 lb 1.6 oz) 76.2 kg (168 lb)    Lab Results: Basic Metabolic Panel:  Recent Labs  08/05/17 0408 08/06/17 0224  NA 133* 135  K 3.9 3.9  CL 105 105  CO2 22 22  GLUCOSE 110* 99  BUN 20 23*  CREATININE 1.65* 1.78*   CBC:  Recent Labs  08/05/17 0408 08/06/17 0224  WBC 4.1 3.8*  HGB 13.5 12.8*  HCT 40.3 38.3*  MCV 101.3* 101.3*  PLT 84* 78*   Cardiac Enzymes: Telemetry: Sinus rhythm   Assessment/Plan:  1.  Non-STEMIWith severe left main disease and total right coronary artery stenosis he is currently pain-free 2.  Chronic kidney disease stage 3-4Probably reasonably stable 3.  Paroxysmal atrial fibrillation currently in sinus rhythm 4.  Long-term use of anticoagulation 5.  Thrombocytopenia-mild on admission but some worsening-doesn't meet definite criteria for HIT but workup in process.  Recommendations:  Still tentatively scheduled on Friday.  I suspect his renal function is relatively stable as he is near his baseline values.  Thrombocytopenia present on admission prior to heparin but has worsened since then.  Workup in progress.   We'll check again later.   Kerry Hough  MD [facility]  08/06/2017, 9:04 AM

## 2017-08-06 NOTE — Progress Notes (Signed)
Physical Therapy Treatment Patient Details Name: Stanley Robles MRN: [id number] DOB: Nov 12, 1932 Today's Date: 08/06/2017    History of Present Illness 83 yo admitted with NSTEMI s/p cath which showed severe left main disease and total right coronary artery stenosis, potential CABG. PMhx:  CVA, sensorineural hearing loss, HLD, PVD, pAF, CKD stage 3, HTN, NSTEMI, chronic diastolic heart failure    PT Comments    Patient progressing with ambulation safety with walker, but remains limited by postural changes and remains a fall risk.  Able to negotiate steps this session and reports no railing on outside steps.  Will continue to progress during acute stay to allow d/c home with intermittent assist at this point.  Likely to need SNF after CABG.  Follow Up Recommendations  Home health PT     Equipment Recommendations  Rolling walker with 5" wheels    Recommendations for Other Services       Precautions / Restrictions Precautions Precautions: Fall    Mobility  Bed Mobility               General bed mobility comments: in chair on arrival   Transfers Overall transfer level: Needs assistance   Transfers: Sit to/from Stand Sit to Stand: Min guard         General transfer comment: for safety due to chair moving on first attempt  Ambulation/Gait Ambulation/Gait assistance: Supervision Ambulation Distance (Feet): 400 Feet Assistive device: Rolling walker (2 wheeled) Gait Pattern/deviations: Step-through pattern;Decreased stride length;Trunk flexed;Shuffle     General Gait Details: unable to correct posture despite cues for head up, good pace, but shuffling some   Stairs Stairs: Yes   Stair Management: Forwards;No rails;One rail Right;Step to pattern Number of Stairs: 3 General stair comments: to ascend hand hold assist and no rail, pt used both rail and hand hold to descend  Wheelchair Mobility    Modified Rankin (Stroke Patients Only)       Balance Overall  balance assessment: Needs assistance   Sitting balance-Leahy Scale: Good       Standing balance-Leahy Scale: Fair                              Cognition Arousal/Alertness: Awake/alert Behavior During Therapy: WFL for tasks assessed/performed Overall Cognitive Status: Within Functional Limits for tasks assessed                                        Exercises      General Comments General comments (skin integrity, edema, etc.): Pre-activity vitals: HR78, SpO2 95% on RA, BP 129/60; Post-activity: HR 77, SpO2 96% on RA, BP 156/58      Pertinent Vitals/Pain Pain Assessment: No/denies pain    Home Living                      Prior Function            PT Goals (current goals can now be found in the care plan section) Progress towards PT goals: Progressing toward goals    Frequency    Min 3X/week      PT Plan Current plan remains appropriate    Co-evaluation              AM-PAC PT "6 Clicks" Daily Activity  Outcome Measure  Difficulty turning over in bed (including adjusting bedclothes,  sheets and blankets)?: A Little Difficulty moving from lying on back to sitting on the side of the bed? : A Little Difficulty sitting down on and standing up from a chair with arms (e.g., wheelchair, bedside commode, etc,.)?: A Little Help needed moving to and from a bed to chair (including a wheelchair)?: A Little Help needed walking in hospital room?: A Little Help needed climbing 3-5 steps with a railing? : A Little 6 Click Score: 18    End of Session Equipment Utilized During Treatment: Gait belt Activity Tolerance: Patient tolerated treatment well Patient left: in chair;with call bell/phone within reach   PT Visit Diagnosis: Difficulty in walking, not elsewhere classified (R26.2);Muscle weakness (generalized) (M62.81)     Time: 4132-4401 PT Time Calculation (min) (ACUTE ONLY): 30 min  Charges:  $Gait Training: 23-37  mins                    G CodesMagda Kiel, [place] 08/06/2017    Reginia Naas 08/06/2017, 10:13 AM

## 2017-08-06 NOTE — Progress Notes (Signed)
RN to place IV Team consult for PIV oncew returns from testing. Pt not currently in room.

## 2017-08-06 NOTE — Progress Notes (Signed)
3 Days Post-Op Procedure(s) (LRB): LEFT HEART CATH AND CORONARY ANGIOGRAPHY (N/A) Subjective: CAD with nonstemi on heparin plt count dropping 78k, plt fx P2Y12 sig decreased- will check HIT, SRA Creat up to 1.8, urine output 900cc May need to delay surgery to sort out thrombocytpenia Objective: Vital signs in last 24 hours: Temp:  [97.9 F (36.6 C)-98.5 F (36.9 C)] 98 F (36.7 C) (09/13 0511) Pulse Rate:  [61-63] 61 (09/13 0511) Cardiac Rhythm: Normal sinus rhythm (09/12 2000) Resp:  [17-21] 17 (09/13 0511) BP: (104-125)/(43-62) 104/62 (09/13 0511) SpO2:  [91 %-93 %] 91 % (09/13 0511) Weight:  [168 lb (76.2 kg)] 168 lb (76.2 kg) (09/13 0511)  Hemodynamic parameters for last 24 hours:    Intake/Output from previous day: 09/12 0701 - 09/13 0700 In: 568 [P.O.:340; I.V.:228] Out: 925 [Urine:925] Intake/Output this shift: No intake/output data recorded.    Lab Results:  Recent Labs  08/05/17 0408 08/06/17 0224  WBC 4.1 3.8*  HGB 13.5 12.8*  HCT 40.3 38.3*  PLT 84* 78*   BMET:  Recent Labs  08/05/17 0408 08/06/17 0224  NA 133* 135  K 3.9 3.9  CL 105 105  CO2 22 22  GLUCOSE 110* 99  BUN 20 23*  CREATININE 1.65* 1.78*  CALCIUM 8.4* 8.3*    PT/INR:  Recent Labs  08/04/17 0410  LABPROT 13.6  INR 1.05   ABG    Component Value Date/Time   PHART 7.487 (H) 08/04/2017 0500   HCO3 19.8 (L) 08/04/2017 0500   TCO2 20 02/27/2015 1219   ACIDBASEDEF 3.2 (H) 08/04/2017 0500   O2SAT 96.5 08/04/2017 0500   CBG (last 3)   Recent Labs  08/04/17 0724 08/05/17 0806 08/05/17 2030  GLUCAP 95 105* 120*    Assessment/Plan: S/P Procedure(s) (LRB): LEFT HEART CATH AND CORONARY ANGIOGRAPHY (N/A) CAD CABG planned after cause of thrombocytopenia is defined but- Cannot do CABG on this patient w/o heparin   LOS: 6 days    Tharon Aquas Trigt III 08/06/2017

## 2017-08-06 NOTE — Progress Notes (Signed)
  Speech Language Pathology Treatment: Dysphagia  Patient Details Name: Stanley Robles MRN: [id number] DOB: 02/22/1932 Today's Date: 08/06/2017 Time: 8366-2947 SLP Time Calculation (min) (ACUTE ONLY): 12 min  Assessment / Plan / Recommendation Clinical Impression  Observed with portion of breakfast after reviewing MBS results and recommendations. No s/s penetration or aspiration. Oral mastication and transit was within functional limits. Pt scheduled for CABG tomorrow. ST will briefly follow given history of dysphagia.   HPI HPI: Pt is an 83 yo male admitted with NSTEMI s/p cath which showed severe left main disease and total right coronary artery stenosis, potential CABG planned for 9/14. 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 Most recent MBS in 2016 recommended regular diet and thin liquids with chin tuck and small sips to reduce episodes of aspiration. Pt and his dtr say that he has not been using a chin tuck at home anymore but that he also has not had PNA.      SLP Plan  Continue with current plan of care       Recommendations  Diet recommendations: Dysphagia 3 (mechanical soft);Thin liquid Liquids provided via: Cup;No straw Medication Administration: Whole meds with puree Supervision: Patient able to self feed;Intermittent supervision to cue for compensatory strategies Compensations: Slow rate;Small sips/bites;Follow solids with liquid Postural Changes and/or Swallow Maneuvers: Seated upright 90 degrees;Upright 30-60 min after meal                Oral Care Recommendations: Oral care BID Follow up Recommendations: None SLP Visit Diagnosis: Dysphagia, oropharyngeal phase (R13.12);Dysphagia, pharyngoesophageal phase (R13.14) Plan: Continue with current plan of care       GO                Houston Siren 08/06/2017, 8:53 AM  Stanley Robles.Ed  [employer] [phone]

## 2017-08-06 NOTE — Progress Notes (Signed)
ANTICOAGULATION CONSULT NOTE - Follow Up Consult  Pharmacy Consult for Heparin Indication: chest pain/ACS  Allergies  Allergen Reactions  . Lipitor [Atorvastatin] Other (See Comments)    Severe constipation    Patient Measurements: Height: 5\' 7"  (170.2 cm) Weight: 168 lb (76.2 kg) IBW/kg (Calculated) : 66.1 Heparin Dosing Weight: 78.7 kg  Vital Signs: Temp: 98 F (36.7 C) (09/13 0511) Temp Source: Oral (09/13 0511) BP: 104/62 (09/13 0511) Pulse Rate: 61 (09/13 0511)  Labs:  Recent Labs  08/04/17 0410 08/04/17 1312 08/04/17 1955 08/05/17 0408 08/06/17 0224  HGB 14.4  --   --  13.5 12.8*  HCT 42.2  --   --  40.3 38.3*  PLT 91*  --   --  84* 78*  APTT 50* 64*  --   --   --   LABPROT 13.6  --   --   --   --   INR 1.05  --   --   --   --   HEPARINUNFRC 0.42 0.36 0.45 0.49 0.43  CREATININE 1.51*  --   --  1.65* 1.78*    Estimated Creatinine Clearance: 29.9 mL/min (A) (by C-G formula based on SCr of 1.78 mg/dL (H)).   Medications:  Scheduled:  . allopurinol  300 mg Oral Daily  . aspirin EC  81 mg Oral Daily  . atenolol  25 mg Oral Daily  . cholecalciferol  2,000 Units Oral Daily  . docusate sodium  100 mg Oral Daily  . finasteride  5 mg Oral Daily  . lisinopril  20 mg Oral Daily  . mirabegron ER  50 mg Oral Daily  . pantoprazole  40 mg Oral Daily  . rosuvastatin  40 mg Oral Daily  . sodium chloride flush  3 mL Intravenous Q12H   Infusions:  . sodium chloride    . heparin Stopped (08/06/17 0830)    Assessment: 83 yo M admitted with CP. S/p cath 9/10 which found severe LM stenosis, severe ostial stenosis of L circ, chronic total occlusion of RCA.  Plans for CABG on 9/14 and pharmacy dosing heparin. Thrombocytopenia noted and HIT labs have been ordered.  -heparin level = 0.49 -hg= 12.8 w/ trend down -plt= 78 (119 at admission). He has a history of thrombocytopenia with platelet count ranging 79-149 (records since 2012). It appears his baseline tends to run  80-100.    Goal of Therapy:  aPTT goal 66-102 sec Heparin level 0.3-0.7 units/ml Monitor platelets by anticoagulation protocol: Yes   Plan:  No heparin changes needed Daily heparin level and CBC  Stanley Robles, Pharm D 08/06/2017 10:40 AM

## 2017-08-06 NOTE — Anesthesia Preprocedure Evaluation (Addendum)
Anesthesia Evaluation  Patient identified by MRN, date of birth, ID band Patient awake    Reviewed: Allergy & Precautions, H&P , NPO status , Patient's Chart, lab work & pertinent test results  Airway Mallampati: II  TM Distance: >3 FB Neck ROM: Full    Dental  (+) Missing, Dental Advisory Given   Pulmonary neg pulmonary ROS, former smoker,    breath sounds clear to auscultation       Cardiovascular hypertension, Pt. on medications + CAD, + Past MI and + Peripheral Vascular Disease  + dysrhythmias Atrial Fibrillation + Valvular Problems/Murmurs MR  Rhythm:Regular Rate:Normal  Cath 08/03/17 - 1. Severe ostial stenosis of left circumflex with a 95% ostial lesion 2. Severe left main stenosis with calcification and a filling defect that may represent a plaque rupture 3. Chronic total occlusion of the right coronary artery collateralized by left to right collaterals 4. Normal LV function by echo  TTE 08/02/2017 - Left ventricle: The cavity size was normal. There was mild concentric hypertrophy. Systolic function was vigorous. The  estimated ejection fraction was in the range of 65% to 70%. Wall  motion was normal; there were no regional wall motion abnormalities. - Aortic valve: There was mild regurgitation. - Mitral valve: Calcified annulus. There was moderate   regurgitation. - Left atrium: The atrium was mildly dilated. - Right ventricle: The cavity size was mildly dilated. Wall   thickness was normal. - Pulmonary arteries: Systolic pressure was mildly increased. PA  peak pressure: 33 mm Hg (S).   Neuro/Psych CVA, No Residual Symptoms negative psych ROS   GI/Hepatic Neg liver ROS, GERD  Medicated and Controlled,  Endo/Other  negative endocrine ROS  Renal/GU CRFRenal disease   BPH, Urinary incontinence    Musculoskeletal  (+) Arthritis , Gout   Abdominal   Peds negative pediatric ROS (+)  Hematology negative hematology  ROS (+)   Anesthesia Other Findings   Reproductive/Obstetrics negative OB ROS                            Anesthesia Physical  Anesthesia Plan  ASA: IV  Anesthesia Plan: General   Post-op Pain Management:    Induction: Intravenous  PONV Risk Score and Plan: 3 and Ondansetron, Midazolam and Treatment may vary due to age or medical condition  Airway Management Planned: Oral ETT  Additional Equipment: Arterial line, CVP, PA Cath, TEE and Ultrasound Guidance Line Placement  Intra-op Plan:   Post-operative Plan: Post-operative intubation/ventilation  Informed Consent: I have reviewed the patients History and Physical, chart, labs and discussed the procedure including the risks, benefits and alternatives for the proposed anesthesia with the patient or authorized representative who has indicated his/her understanding and acceptance.   Dental advisory given  Plan Discussed with: CRNA  Anesthesia Plan Comments:        Anesthesia Quick Evaluation

## 2017-08-06 NOTE — Progress Notes (Signed)
CT Surgery  Situation reviewed with patient and family regarding CABG Dr Azucena Freed comprehensive assessment of thrombocytopenia  greatly appreciated- pt prob has ITP. Will proceed with CABG for critical CAD[ total RCA and tight L main] in am with plan to use platelets, DDAVP as needed.The family and patient understand this is a high risk operation but his only option  to extend survival.

## 2017-08-07 ENCOUNTER — Inpatient Hospital Stay (HOSPITAL_COMMUNITY): Payer: Medicare Other

## 2017-08-07 ENCOUNTER — Inpatient Hospital Stay (HOSPITAL_COMMUNITY): Payer: Medicare Other | Admitting: Certified Registered Nurse Anesthetist

## 2017-08-07 ENCOUNTER — Inpatient Hospital Stay (HOSPITAL_COMMUNITY)
Admission: EM | Disposition: A | Discharge status: Skilled Nursing Facility | Payer: Self-pay | Source: Home / Self Care | Attending: Cardiothoracic Surgery

## 2017-08-07 DIAGNOSIS — Z951 Presence of aortocoronary bypass graft: Secondary | ICD-10-CM

## 2017-08-07 DIAGNOSIS — I25110 Atherosclerotic heart disease of native coronary artery with unstable angina pectoris: Secondary | ICD-10-CM

## 2017-08-07 HISTORY — PX: TEE WITHOUT CARDIOVERSION: SHX5443

## 2017-08-07 HISTORY — PX: CORONARY ARTERY BYPASS GRAFT: SHX141

## 2017-08-07 LAB — POCT I-STAT, CHEM 8
BUN: 24 mg/dL — AB (ref 6–20)
BUN: 22 mg/dL — ABNORMAL HIGH (ref 6–20)
BUN: 20 mg/dL (ref 6–20)
BUN: 21 mg/dL — ABNORMAL HIGH (ref 6–20)
BUN: 19 mg/dL (ref 6–20)
BUN: 17 mg/dL (ref 6–20)
CALCIUM ION: 1.26 mmol/L (ref 1.15–1.40)
CALCIUM ION: 0.96 mmol/L — AB (ref 1.15–1.40)
CALCIUM ION: 1.18 mmol/L (ref 1.15–1.40)
CHLORIDE: 99 mmol/L — AB (ref 101–111)
CHLORIDE: 98 mmol/L — AB (ref 101–111)
CHLORIDE: 100 mmol/L — AB (ref 101–111)
CHLORIDE: 101 mmol/L (ref 101–111)
CREATININE: 1.4 mg/dL — AB (ref 0.61–1.24)
CREATININE: 1.3 mg/dL — AB (ref 0.61–1.24)
CREATININE: 1 mg/dL (ref 0.61–1.24)
Calcium, Ion: 1.24 mmol/L (ref 1.15–1.40)
Calcium, Ion: 1 mmol/L — ABNORMAL LOW (ref 1.15–1.40)
Calcium, Ion: 1.08 mmol/L — ABNORMAL LOW (ref 1.15–1.40)
Chloride: 102 mmol/L (ref 101–111)
Chloride: 103 mmol/L (ref 101–111)
Creatinine, Ser: 1 mg/dL (ref 0.61–1.24)
Creatinine, Ser: 1.1 mg/dL (ref 0.61–1.24)
Creatinine, Ser: 1.2 mg/dL (ref 0.61–1.24)
GLUCOSE: 90 mg/dL (ref 65–99)
GLUCOSE: 95 mg/dL (ref 65–99)
GLUCOSE: 128 mg/dL — AB (ref 65–99)
GLUCOSE: 143 mg/dL — AB (ref 65–99)
Glucose, Bld: 105 mg/dL — ABNORMAL HIGH (ref 65–99)
Glucose, Bld: 117 mg/dL — ABNORMAL HIGH (ref 65–99)
HCT: 37 % — ABNORMAL LOW (ref 39.0–52.0)
HCT: 34 % — ABNORMAL LOW (ref 39.0–52.0)
HCT: 27 % — ABNORMAL LOW (ref 39.0–52.0)
HCT: 23 % — ABNORMAL LOW (ref 39.0–52.0)
HEMATOCRIT: 25 % — AB (ref 39.0–52.0)
HEMATOCRIT: 25 % — AB (ref 39.0–52.0)
HEMOGLOBIN: 11.6 g/dL — AB (ref 13.0–17.0)
Hemoglobin: 12.6 g/dL — ABNORMAL LOW (ref 13.0–17.0)
Hemoglobin: 8.5 g/dL — ABNORMAL LOW (ref 13.0–17.0)
Hemoglobin: 8.5 g/dL — ABNORMAL LOW (ref 13.0–17.0)
Hemoglobin: 9.2 g/dL — ABNORMAL LOW (ref 13.0–17.0)
Hemoglobin: 7.8 g/dL — ABNORMAL LOW (ref 13.0–17.0)
POTASSIUM: 4.4 mmol/L (ref 3.5–5.1)
POTASSIUM: 3.6 mmol/L (ref 3.5–5.1)
Potassium: 3.9 mmol/L (ref 3.5–5.1)
Potassium: 4 mmol/L (ref 3.5–5.1)
Potassium: 4.5 mmol/L (ref 3.5–5.1)
Potassium: 3.9 mmol/L (ref 3.5–5.1)
SODIUM: 137 mmol/L (ref 135–145)
SODIUM: 132 mmol/L — AB (ref 135–145)
Sodium: 138 mmol/L (ref 135–145)
Sodium: 138 mmol/L (ref 135–145)
Sodium: 137 mmol/L (ref 135–145)
Sodium: 136 mmol/L (ref 135–145)
TCO2: 28 mmol/L (ref 22–32)
TCO2: 25 mmol/L (ref 22–32)
TCO2: 28 mmol/L (ref 22–32)
TCO2: 27 mmol/L (ref 22–32)
TCO2: 21 mmol/L — ABNORMAL LOW (ref 22–32)
TCO2: 24 mmol/L (ref 22–32)

## 2017-08-07 LAB — POCT I-STAT 3, ART BLOOD GAS (G3+)
ACID-BASE DEFICIT: 2 mmol/L (ref 0.0–2.0)
Acid-Base Excess: 2 mmol/L (ref 0.0–2.0)
Acid-Base Excess: 2 mmol/L (ref 0.0–2.0)
Acid-Base Excess: 1 mmol/L (ref 0.0–2.0)
Acid-base deficit: 3 mmol/L — ABNORMAL HIGH (ref 0.0–2.0)
Acid-base deficit: 1 mmol/L (ref 0.0–2.0)
Acid-base deficit: 1 mmol/L (ref 0.0–2.0)
BICARBONATE: 21.7 mmol/L (ref 20.0–28.0)
BICARBONATE: 26.6 mmol/L (ref 20.0–28.0)
BICARBONATE: 23.6 mmol/L (ref 20.0–28.0)
Bicarbonate: 24.8 mmol/L (ref 20.0–28.0)
Bicarbonate: 27.1 mmol/L (ref 20.0–28.0)
Bicarbonate: 24.8 mmol/L (ref 20.0–28.0)
Bicarbonate: 21.5 mmol/L (ref 20.0–28.0)
O2 SAT: 88 %
O2 SAT: 97 %
O2 SAT: 95 %
O2 Saturation: 100 %
O2 Saturation: 90 %
O2 Saturation: 100 %
O2 Saturation: 94 %
PCO2 ART: 28.1 mmHg — AB (ref 32.0–48.0)
PCO2 ART: 37 mmHg (ref 32.0–48.0)
PCO2 ART: 34.7 mmHg (ref 32.0–48.0)
PCO2 ART: 37.4 mmHg (ref 32.0–48.0)
PCO2 ART: 37.5 mmHg (ref 32.0–48.0)
PCO2 ART: 28.4 mmHg — AB (ref 32.0–48.0)
PH ART: 7.555 — AB (ref 7.350–7.450)
PH ART: 7.376 (ref 7.350–7.450)
PH ART: 7.482 — AB (ref 7.350–7.450)
PH ART: 7.43 (ref 7.350–7.450)
PH ART: 7.407 (ref 7.350–7.450)
PO2 ART: 59 mmHg — AB (ref 83.0–108.0)
PO2 ART: 85 mmHg (ref 83.0–108.0)
PO2 ART: 68 mmHg — AB (ref 83.0–108.0)
Patient temperature: 34.6
Patient temperature: 36.9
Patient temperature: 37.1
TCO2: 26 mmol/L (ref 22–32)
TCO2: 29 mmol/L (ref 22–32)
TCO2: 23 mmol/L (ref 22–32)
TCO2: 28 mmol/L (ref 22–32)
TCO2: 26 mmol/L (ref 22–32)
TCO2: 25 mmol/L (ref 22–32)
TCO2: 22 mmol/L (ref 22–32)
pCO2 arterial: 72.4 mmHg (ref 32.0–48.0)
pH, Arterial: 7.181 — CL (ref 7.350–7.450)
pH, Arterial: 7.486 — ABNORMAL HIGH (ref 7.350–7.450)
pO2, Arterial: 306 mmHg — ABNORMAL HIGH (ref 83.0–108.0)
pO2, Arterial: 69 mmHg — ABNORMAL LOW (ref 83.0–108.0)
pO2, Arterial: 160 mmHg — ABNORMAL HIGH (ref 83.0–108.0)
pO2, Arterial: 70 mmHg — ABNORMAL LOW (ref 83.0–108.0)

## 2017-08-07 LAB — CBC
HCT: 44.3 % (ref 39.0–52.0)
HCT: 26.6 % — ABNORMAL LOW (ref 39.0–52.0)
HCT: 26.1 % — ABNORMAL LOW (ref 39.0–52.0)
Hemoglobin: 15.2 g/dL (ref 13.0–17.0)
Hemoglobin: 9.3 g/dL — ABNORMAL LOW (ref 13.0–17.0)
Hemoglobin: 9.1 g/dL — ABNORMAL LOW (ref 13.0–17.0)
MCH: 34.7 pg — ABNORMAL HIGH (ref 26.0–34.0)
MCH: 35 pg — ABNORMAL HIGH (ref 26.0–34.0)
MCH: 34.7 pg — ABNORMAL HIGH (ref 26.0–34.0)
MCHC: 34.3 g/dL (ref 30.0–36.0)
MCHC: 35 g/dL (ref 30.0–36.0)
MCHC: 34.9 g/dL (ref 30.0–36.0)
MCV: 101.1 fL — AB (ref 78.0–100.0)
MCV: 100 fL (ref 78.0–100.0)
MCV: 99.6 fL (ref 78.0–100.0)
PLATELETS: 93 10*3/uL — AB (ref 150–400)
PLATELETS: 91 10*3/uL — AB (ref 150–400)
Platelets: 107 10*3/uL — ABNORMAL LOW (ref 150–400)
RBC: 4.38 MIL/uL (ref 4.22–5.81)
RBC: 2.66 MIL/uL — AB (ref 4.22–5.81)
RBC: 2.62 MIL/uL — ABNORMAL LOW (ref 4.22–5.81)
RDW: 14.6 % (ref 11.5–15.5)
RDW: 14.7 % (ref 11.5–15.5)
RDW: 14.6 % (ref 11.5–15.5)
WBC: 5 10*3/uL (ref 4.0–10.5)
WBC: 5.2 10*3/uL (ref 4.0–10.5)
WBC: 7 10*3/uL (ref 4.0–10.5)

## 2017-08-07 LAB — APTT: aPTT: 38 seconds — ABNORMAL HIGH (ref 24–36)

## 2017-08-07 LAB — POCT I-STAT 3, VENOUS BLOOD GAS (G3P V)
Bicarbonate: 25.1 mmol/L (ref 20.0–28.0)
O2 SAT: 62 %
PCO2 VEN: 43.3 mmHg — AB (ref 44.0–60.0)
PH VEN: 7.371 (ref 7.250–7.430)
TCO2: 26 mmol/L (ref 22–32)
pO2, Ven: 33 mmHg (ref 32.0–45.0)

## 2017-08-07 LAB — BASIC METABOLIC PANEL
Anion gap: 9 (ref 5–15)
BUN: 23 mg/dL — ABNORMAL HIGH (ref 6–20)
CO2: 21 mmol/L — ABNORMAL LOW (ref 22–32)
Calcium: 8.8 mg/dL — ABNORMAL LOW (ref 8.9–10.3)
Chloride: 105 mmol/L (ref 101–111)
Creatinine, Ser: 1.68 mg/dL — ABNORMAL HIGH (ref 0.61–1.24)
GFR calc Af Amer: 42 mL/min — ABNORMAL LOW (ref >60)
GFR calc non Af Amer: 36 mL/min — ABNORMAL LOW (ref >60)
Glucose, Bld: 86 mg/dL (ref 65–99)
Potassium: 4 mmol/L (ref 3.5–5.1)
Sodium: 135 mmol/L (ref 135–145)

## 2017-08-07 LAB — POCT I-STAT 4, (NA,K, GLUC, HGB,HCT)
Glucose, Bld: 163 mg/dL — ABNORMAL HIGH (ref 65–99)
HEMATOCRIT: 26 % — AB (ref 39.0–52.0)
HEMOGLOBIN: 8.8 g/dL — AB (ref 13.0–17.0)
Potassium: 3.7 mmol/L (ref 3.5–5.1)
SODIUM: 138 mmol/L (ref 135–145)

## 2017-08-07 LAB — PROTIME-INR
INR: 1.48
Prothrombin Time: 17.8 seconds — ABNORMAL HIGH (ref 11.4–15.2)

## 2017-08-07 LAB — MAGNESIUM: Magnesium: 2.9 mg/dL — ABNORMAL HIGH (ref 1.7–2.4)

## 2017-08-07 LAB — GLUCOSE, CAPILLARY
GLUCOSE-CAPILLARY: 164 mg/dL — AB (ref 65–99)
GLUCOSE-CAPILLARY: 127 mg/dL — AB (ref 65–99)
GLUCOSE-CAPILLARY: 120 mg/dL — AB (ref 65–99)
Glucose-Capillary: 152 mg/dL — ABNORMAL HIGH (ref 65–99)
Glucose-Capillary: 126 mg/dL — ABNORMAL HIGH (ref 65–99)
Glucose-Capillary: 152 mg/dL — ABNORMAL HIGH (ref 65–99)
Glucose-Capillary: 134 mg/dL — ABNORMAL HIGH (ref 65–99)

## 2017-08-07 LAB — CREATININE, SERUM
Creatinine, Ser: 1.41 mg/dL — ABNORMAL HIGH (ref 0.61–1.24)
GFR calc Af Amer: 52 mL/min — ABNORMAL LOW (ref >60)
GFR calc non Af Amer: 45 mL/min — ABNORMAL LOW (ref >60)

## 2017-08-07 LAB — HEPARIN INDUCED PLATELET AB (HIT ANTIBODY): Heparin Induced Plt Ab: 0.186 OD (ref 0.000–0.400)

## 2017-08-07 LAB — HEMOGLOBIN AND HEMATOCRIT, BLOOD
HCT: 27.5 % — ABNORMAL LOW (ref 39.0–52.0)
Hemoglobin: 9.4 g/dL — ABNORMAL LOW (ref 13.0–17.0)

## 2017-08-07 LAB — PLATELET COUNT: Platelets: 79 10*3/uL — ABNORMAL LOW (ref 150–400)

## 2017-08-07 LAB — HEPARIN LEVEL (UNFRACTIONATED): HEPARIN UNFRACTIONATED: 0.53 [IU]/mL (ref 0.30–0.70)

## 2017-08-07 SURGERY — CORONARY ARTERY BYPASS GRAFTING (CABG)
Anesthesia: General | Site: Chest

## 2017-08-07 MED ORDER — DOCUSATE SODIUM 100 MG PO CAPS
200.0000 mg | ORAL_CAPSULE | Freq: Every day | ORAL | Status: DC
  Administered 2017-08-08 – 2017-08-27 (×8): 200 mg via ORAL
  Filled 2017-08-07 (×11): qty 2

## 2017-08-07 MED ORDER — METOCLOPRAMIDE HCL 5 MG/ML IJ SOLN
10.0000 mg | Freq: Four times a day (QID) | INTRAMUSCULAR | Status: DC
  Administered 2017-08-07 – 2017-08-10 (×12): 10 mg via INTRAVENOUS
  Filled 2017-08-07 (×11): qty 2

## 2017-08-07 MED ORDER — LACTATED RINGERS IV SOLN
INTRAVENOUS | Status: DC
  Administered 2017-08-07: 17:00:00 via INTRAVENOUS

## 2017-08-07 MED ORDER — CHLORHEXIDINE GLUCONATE CLOTH 2 % EX PADS
6.0000 | MEDICATED_PAD | Freq: Every day | CUTANEOUS | Status: DC
  Administered 2017-08-07 – 2017-08-10 (×4): 6 via TOPICAL

## 2017-08-07 MED ORDER — METOPROLOL TARTRATE 25 MG/10 ML ORAL SUSPENSION
12.5000 mg | Freq: Two times a day (BID) | ORAL | Status: DC
  Filled 2017-08-07: qty 5

## 2017-08-07 MED ORDER — ROSUVASTATIN CALCIUM 40 MG PO TABS
40.0000 mg | ORAL_TABLET | Freq: Every day | ORAL | Status: DC
  Administered 2017-08-08 – 2017-08-09 (×2): 40 mg via ORAL
  Filled 2017-08-07: qty 1
  Filled 2017-08-07: qty 2
  Filled 2017-08-07: qty 1
  Filled 2017-08-07: qty 2

## 2017-08-07 MED ORDER — CALCIUM CHLORIDE 10 % IV SOLN
INTRAVENOUS | Status: DC | PRN
  Administered 2017-08-07 (×2): 100 mg via INTRAVENOUS

## 2017-08-07 MED ORDER — ACETAMINOPHEN 160 MG/5ML PO SOLN: 650.0000 mg | Freq: Once | ORAL | Status: AC

## 2017-08-07 MED ORDER — PHENYLEPHRINE 40 MCG/ML (10ML) SYRINGE FOR IV PUSH (FOR BLOOD PRESSURE SUPPORT)
PREFILLED_SYRINGE | INTRAVENOUS | Status: AC
  Filled 2017-08-07: qty 10

## 2017-08-07 MED ORDER — SODIUM CHLORIDE 0.9 % IV SOLN
0.0000 ug/kg/h | INTRAVENOUS | Status: DC
  Administered 2017-08-07: 0.4 ug/kg/h via INTRAVENOUS
  Filled 2017-08-07: qty 2

## 2017-08-07 MED ORDER — HEPARIN SODIUM (PORCINE) 1000 UNIT/ML IJ SOLN
INTRAMUSCULAR | Status: DC | PRN
  Administered 2017-08-07 (×2): 2000 [IU] via INTRAVENOUS
  Administered 2017-08-07: 24000 [IU] via INTRAVENOUS

## 2017-08-07 MED ORDER — PROPOFOL 10 MG/ML IV BOLUS
INTRAVENOUS | Status: DC | PRN
  Administered 2017-08-07: 50 mg via INTRAVENOUS

## 2017-08-07 MED ORDER — DEXTROSE 5 % IV SOLN
1.5000 g | Freq: Two times a day (BID) | INTRAVENOUS | Status: AC
  Administered 2017-08-07 – 2017-08-09 (×4): 1.5 g via INTRAVENOUS
  Filled 2017-08-07 (×4): qty 1.5

## 2017-08-07 MED ORDER — LACTATED RINGERS IV SOLN: INTRAVENOUS | Status: DC

## 2017-08-07 MED ORDER — ALBUMIN HUMAN 5 % IV SOLN
250.0000 mL | INTRAVENOUS | Status: AC | PRN
  Administered 2017-08-07 (×2): 250 mL via INTRAVENOUS
  Filled 2017-08-07: qty 250

## 2017-08-07 MED ORDER — VANCOMYCIN HCL IN DEXTROSE 1-5 GM/200ML-% IV SOLN
1000.0000 mg | Freq: Once | INTRAVENOUS | Status: AC
  Administered 2017-08-07: 1000 mg via INTRAVENOUS
  Filled 2017-08-07: qty 200

## 2017-08-07 MED ORDER — SODIUM CHLORIDE 0.9% FLUSH
3.0000 mL | INTRAVENOUS | Status: DC | PRN
  Administered 2017-08-21: 3 mL via INTRAVENOUS
  Filled 2017-08-07: qty 3

## 2017-08-07 MED ORDER — LACTATED RINGERS IV SOLN
INTRAVENOUS | Status: DC | PRN
  Administered 2017-08-07: 07:00:00 via INTRAVENOUS

## 2017-08-07 MED ORDER — FENTANYL CITRATE (PF) 250 MCG/5ML IJ SOLN
INTRAMUSCULAR | Status: AC
  Filled 2017-08-07: qty 5

## 2017-08-07 MED ORDER — MORPHINE SULFATE (PF) 4 MG/ML IV SOLN: 2.0000 mg | INTRAVENOUS | Status: DC | PRN

## 2017-08-07 MED ORDER — ROCURONIUM BROMIDE 10 MG/ML (PF) SYRINGE
PREFILLED_SYRINGE | INTRAVENOUS | Status: AC
  Filled 2017-08-07: qty 5

## 2017-08-07 MED ORDER — SODIUM CHLORIDE 0.9% FLUSH
10.0000 mL | Freq: Two times a day (BID) | INTRAVENOUS | Status: DC
  Administered 2017-08-07: 10 mL
  Administered 2017-08-09: 30 mL
  Administered 2017-08-09 – 2017-08-19 (×10): 10 mL

## 2017-08-07 MED ORDER — MAGNESIUM SULFATE 4 GM/100ML IV SOLN
4.0000 g | Freq: Once | INTRAVENOUS | Status: AC
  Administered 2017-08-07: 4 g via INTRAVENOUS
  Filled 2017-08-07: qty 100

## 2017-08-07 MED ORDER — POTASSIUM CHLORIDE 10 MEQ/50ML IV SOLN
10.0000 meq | INTRAVENOUS | Status: AC
  Administered 2017-08-07 (×2): 10 meq via INTRAVENOUS

## 2017-08-07 MED ORDER — LEVALBUTEROL HCL 1.25 MG/0.5ML IN NEBU: 1.2500 mg | INHALATION_SOLUTION | Freq: Four times a day (QID) | RESPIRATORY_TRACT | Status: DC

## 2017-08-07 MED ORDER — SODIUM CHLORIDE 0.9% FLUSH: 10.0000 mL | INTRAVENOUS | Status: DC | PRN

## 2017-08-07 MED ORDER — FINASTERIDE 5 MG PO TABS
5.0000 mg | ORAL_TABLET | Freq: Every day | ORAL | Status: DC
  Administered 2017-08-08 – 2017-08-28 (×15): 5 mg via ORAL
  Filled 2017-08-07 (×17): qty 1

## 2017-08-07 MED ORDER — ACETAMINOPHEN 160 MG/5ML PO SOLN: 1000.0000 mg | Freq: Four times a day (QID) | ORAL | Status: AC

## 2017-08-07 MED ORDER — LEVALBUTEROL HCL 1.25 MG/0.5ML IN NEBU
1.2500 mg | INHALATION_SOLUTION | Freq: Four times a day (QID) | RESPIRATORY_TRACT | Status: DC
  Administered 2017-08-07 – 2017-08-10 (×14): 1.25 mg via RESPIRATORY_TRACT
  Filled 2017-08-07 (×14): qty 0.5

## 2017-08-07 MED ORDER — METOPROLOL TARTRATE 12.5 MG HALF TABLET
12.5000 mg | ORAL_TABLET | Freq: Two times a day (BID) | ORAL | Status: DC
  Administered 2017-08-08 – 2017-08-12 (×7): 12.5 mg via ORAL
  Filled 2017-08-07 (×7): qty 1

## 2017-08-07 MED ORDER — ACETAMINOPHEN 500 MG PO TABS
1000.0000 mg | ORAL_TABLET | Freq: Four times a day (QID) | ORAL | Status: AC
  Administered 2017-08-08 – 2017-08-12 (×11): 1000 mg via ORAL
  Filled 2017-08-07 (×11): qty 2

## 2017-08-07 MED ORDER — SODIUM CHLORIDE 0.9 % IV SOLN
INTRAVENOUS | Status: DC
  Filled 2017-08-07: qty 1

## 2017-08-07 MED ORDER — PROTAMINE SULFATE 10 MG/ML IV SOLN
INTRAVENOUS | Status: DC | PRN
  Administered 2017-08-07: 280 mg via INTRAVENOUS

## 2017-08-07 MED ORDER — ALLOPURINOL 300 MG PO TABS
300.0000 mg | ORAL_TABLET | Freq: Every day | ORAL | Status: DC
  Administered 2017-08-08 – 2017-08-10 (×3): 300 mg via ORAL
  Filled 2017-08-07 (×3): qty 1

## 2017-08-07 MED ORDER — SODIUM CHLORIDE 0.9% FLUSH
3.0000 mL | Freq: Two times a day (BID) | INTRAVENOUS | Status: DC
  Administered 2017-08-10 – 2017-08-26 (×24): 3 mL via INTRAVENOUS

## 2017-08-07 MED ORDER — MIDAZOLAM HCL 10 MG/2ML IJ SOLN
INTRAMUSCULAR | Status: AC
  Filled 2017-08-07: qty 2

## 2017-08-07 MED ORDER — VECURONIUM BROMIDE 10 MG IV SOLR
INTRAVENOUS | Status: AC
  Filled 2017-08-07: qty 10

## 2017-08-07 MED ORDER — SODIUM CHLORIDE 0.9 % IV SOLN
0.0000 ug/min | INTRAVENOUS | Status: DC
  Filled 2017-08-07: qty 2

## 2017-08-07 MED ORDER — CHLORHEXIDINE GLUCONATE 0.12 % MT SOLN
15.0000 mL | OROMUCOSAL | Status: AC
  Administered 2017-08-07: 15 mL via OROMUCOSAL

## 2017-08-07 MED ORDER — ACETAMINOPHEN 650 MG RE SUPP
650.0000 mg | Freq: Once | RECTAL | Status: AC
  Administered 2017-08-07: 650 mg via RECTAL

## 2017-08-07 MED ORDER — SODIUM CHLORIDE 0.9 % IV SOLN: Freq: Once | INTRAVENOUS | Status: DC

## 2017-08-07 MED ORDER — SODIUM CHLORIDE 0.9 % IJ SOLN
OROMUCOSAL | Status: DC | PRN
  Administered 2017-08-07 (×3): 4 mL via TOPICAL

## 2017-08-07 MED ORDER — DOPAMINE-DEXTROSE 3.2-5 MG/ML-% IV SOLN: 2.5000 ug/kg/min | INTRAVENOUS | Status: DC

## 2017-08-07 MED ORDER — SODIUM CHLORIDE 0.9 % IV SOLN: 250.0000 mL | INTRAVENOUS | Status: DC

## 2017-08-07 MED ORDER — HEPARIN SODIUM (PORCINE) 1000 UNIT/ML IJ SOLN
INTRAMUSCULAR | Status: AC
  Filled 2017-08-07: qty 1

## 2017-08-07 MED ORDER — FENTANYL CITRATE (PF) 250 MCG/5ML IJ SOLN
INTRAMUSCULAR | Status: AC
Start: 2017-08-07 — End: ?
  Filled 2017-08-07: qty 5

## 2017-08-07 MED ORDER — ALBUTEROL SULFATE HFA 108 (90 BASE) MCG/ACT IN AERS
INHALATION_SPRAY | RESPIRATORY_TRACT | Status: AC
  Filled 2017-08-07: qty 6.7

## 2017-08-07 MED ORDER — TRAMADOL HCL 50 MG PO TABS
50.0000 mg | ORAL_TABLET | ORAL | Status: DC | PRN
  Administered 2017-08-08: 50 mg via ORAL
  Filled 2017-08-07: qty 1

## 2017-08-07 MED ORDER — BISACODYL 5 MG PO TBEC
10.0000 mg | DELAYED_RELEASE_TABLET | Freq: Every day | ORAL | Status: DC
  Administered 2017-08-08 – 2017-08-28 (×9): 10 mg via ORAL
  Filled 2017-08-07 (×11): qty 2

## 2017-08-07 MED ORDER — ROCURONIUM BROMIDE 10 MG/ML (PF) SYRINGE
PREFILLED_SYRINGE | INTRAVENOUS | Status: DC | PRN
  Administered 2017-08-07: 30 mg via INTRAVENOUS
  Administered 2017-08-07 (×2): 50 mg via INTRAVENOUS

## 2017-08-07 MED ORDER — OXYCODONE HCL 5 MG PO TABS
5.0000 mg | ORAL_TABLET | ORAL | Status: DC | PRN
  Administered 2017-08-08: 5 mg via ORAL
  Filled 2017-08-07: qty 1

## 2017-08-07 MED ORDER — LACTATED RINGERS IV SOLN
INTRAVENOUS | Status: DC | PRN
Start: 2017-08-07 — End: 2017-08-07
  Administered 2017-08-07: 07:00:00 via INTRAVENOUS

## 2017-08-07 MED ORDER — MILRINONE LACTATE IN DEXTROSE 20-5 MG/100ML-% IV SOLN
0.1250 ug/kg/min | INTRAVENOUS | Status: DC
  Administered 2017-08-08 – 2017-08-12 (×6): 0.25 ug/kg/min via INTRAVENOUS
  Administered 2017-08-13: 0.125 ug/kg/min via INTRAVENOUS
  Administered 2017-08-13: 0.25 ug/kg/min via INTRAVENOUS
  Administered 2017-08-15 – 2017-08-17 (×3): 0.125 ug/kg/min via INTRAVENOUS
  Filled 2017-08-07 (×5): qty 100
  Filled 2017-08-07: qty 200
  Filled 2017-08-07 (×6): qty 100

## 2017-08-07 MED ORDER — LACTATED RINGERS IV SOLN
INTRAVENOUS | Status: DC | PRN
  Administered 2017-08-07 (×2): via INTRAVENOUS

## 2017-08-07 MED ORDER — NOREPINEPHRINE BITARTRATE 1 MG/ML IV SOLN
0.0000 ug/min | INTRAVENOUS | Status: DC
  Filled 2017-08-07: qty 16

## 2017-08-07 MED ORDER — 0.9 % SODIUM CHLORIDE (POUR BTL) OPTIME
TOPICAL | Status: DC | PRN
  Administered 2017-08-07: 5000 mL

## 2017-08-07 MED ORDER — MIDAZOLAM HCL 5 MG/5ML IJ SOLN
INTRAMUSCULAR | Status: DC | PRN
  Administered 2017-08-07: 4 mg via INTRAVENOUS
  Administered 2017-08-07: 2 mg via INTRAVENOUS
  Administered 2017-08-07: 1 mg via INTRAVENOUS

## 2017-08-07 MED ORDER — FAMOTIDINE IN NACL 20-0.9 MG/50ML-% IV SOLN
20.0000 mg | Freq: Two times a day (BID) | INTRAVENOUS | Status: AC
  Administered 2017-08-07 (×2): 20 mg via INTRAVENOUS
  Filled 2017-08-07: qty 50

## 2017-08-07 MED ORDER — LACTATED RINGERS IV SOLN: 500.0000 mL | Freq: Once | INTRAVENOUS | Status: DC | PRN

## 2017-08-07 MED ORDER — NOREPINEPHRINE BITARTRATE 1 MG/ML IV SOLN
0.0000 ug/min | Freq: Once | INTRAVENOUS | Status: AC
  Administered 2017-08-07: 4 ug/min via INTRAVENOUS
  Filled 2017-08-07: qty 4

## 2017-08-07 MED ORDER — NITROGLYCERIN IN D5W 200-5 MCG/ML-% IV SOLN
0.0000 ug/min | INTRAVENOUS | Status: DC
Start: 2017-08-07 — End: 2017-08-09

## 2017-08-07 MED ORDER — HEMOSTATIC AGENTS (NO CHARGE) OPTIME
TOPICAL | Status: DC | PRN
  Administered 2017-08-07 (×3): 1 via TOPICAL

## 2017-08-07 MED ORDER — PANTOPRAZOLE SODIUM 40 MG PO TBEC
40.0000 mg | DELAYED_RELEASE_TABLET | Freq: Every day | ORAL | Status: DC
  Administered 2017-08-09 – 2017-08-11 (×3): 40 mg via ORAL
  Filled 2017-08-07 (×4): qty 1

## 2017-08-07 MED ORDER — ASPIRIN EC 325 MG PO TBEC
325.0000 mg | DELAYED_RELEASE_TABLET | Freq: Every day | ORAL | Status: DC
  Administered 2017-08-08 – 2017-08-20 (×7): 325 mg via ORAL
  Filled 2017-08-07 (×9): qty 1

## 2017-08-07 MED ORDER — ASPIRIN 81 MG PO CHEW
324.0000 mg | CHEWABLE_TABLET | Freq: Every day | ORAL | Status: DC
  Administered 2017-08-18 – 2017-08-21 (×2): 324 mg
  Filled 2017-08-07 (×2): qty 4

## 2017-08-07 MED ORDER — FENTANYL CITRATE (PF) 250 MCG/5ML IJ SOLN
INTRAMUSCULAR | Status: DC | PRN
  Administered 2017-08-07: 100 ug via INTRAVENOUS
  Administered 2017-08-07: 150 ug via INTRAVENOUS
  Administered 2017-08-07: 100 ug via INTRAVENOUS
  Administered 2017-08-07: 150 ug via INTRAVENOUS
  Administered 2017-08-07 (×2): 100 ug via INTRAVENOUS
  Administered 2017-08-07: 150 ug via INTRAVENOUS
  Administered 2017-08-07: 500 ug via INTRAVENOUS

## 2017-08-07 MED ORDER — STERILE WATER FOR IRRIGATION IR SOLN
Status: DC | PRN
  Administered 2017-08-07: 2000 mL

## 2017-08-07 MED ORDER — METOPROLOL TARTRATE 5 MG/5ML IV SOLN
2.5000 mg | INTRAVENOUS | Status: DC | PRN
  Administered 2017-08-08: 5 mg via INTRAVENOUS
  Administered 2017-08-13: 2.5 mg via INTRAVENOUS
  Administered 2017-08-14: 5 mg via INTRAVENOUS
  Administered 2017-08-15 (×2): 2.5 mg via INTRAVENOUS
  Filled 2017-08-07 (×5): qty 5

## 2017-08-07 MED ORDER — ORAL CARE MOUTH RINSE
15.0000 mL | OROMUCOSAL | Status: DC
  Administered 2017-08-07 – 2017-08-08 (×6): 15 mL via OROMUCOSAL

## 2017-08-07 MED ORDER — MILRINONE LACTATE IN DEXTROSE 20-5 MG/100ML-% IV SOLN
0.1250 ug/kg/min | INTRAVENOUS | Status: DC
  Administered 2017-08-07: .25 ug/kg/min via INTRAVENOUS
  Filled 2017-08-07: qty 100

## 2017-08-07 MED ORDER — PROPOFOL 10 MG/ML IV BOLUS
INTRAVENOUS | Status: AC
  Filled 2017-08-07: qty 20

## 2017-08-07 MED ORDER — ALBUTEROL SULFATE HFA 108 (90 BASE) MCG/ACT IN AERS
INHALATION_SPRAY | RESPIRATORY_TRACT | Status: DC | PRN
  Administered 2017-08-07: 4 via RESPIRATORY_TRACT

## 2017-08-07 MED ORDER — MORPHINE SULFATE (PF) 4 MG/ML IV SOLN
1.0000 mg | INTRAVENOUS | Status: DC | PRN
  Administered 2017-08-08 (×2): 4 mg via INTRAVENOUS
  Filled 2017-08-07 (×2): qty 1

## 2017-08-07 MED ORDER — MIDAZOLAM HCL 2 MG/2ML IJ SOLN: 2.0000 mg | INTRAMUSCULAR | Status: DC | PRN

## 2017-08-07 MED ORDER — ALBUMIN HUMAN 5 % IV SOLN
INTRAVENOUS | Status: DC | PRN
  Administered 2017-08-07 (×2): via INTRAVENOUS

## 2017-08-07 MED ORDER — BISACODYL 10 MG RE SUPP
10.0000 mg | Freq: Every day | RECTAL | Status: DC
  Administered 2017-08-11 – 2017-08-15 (×3): 10 mg via RECTAL
  Filled 2017-08-07 (×3): qty 1

## 2017-08-07 MED ORDER — MORPHINE SULFATE (PF) 2 MG/ML IV SOLN: 1.0000 mg | INTRAVENOUS | Status: DC | PRN

## 2017-08-07 MED ORDER — CALCIUM CHLORIDE 10 % IV SOLN
INTRAVENOUS | Status: AC
  Filled 2017-08-07: qty 10

## 2017-08-07 MED ORDER — CHLORHEXIDINE GLUCONATE 0.12% ORAL RINSE (MEDLINE KIT)
15.0000 mL | Freq: Two times a day (BID) | OROMUCOSAL | Status: DC
  Administered 2017-08-07 – 2017-08-08 (×2): 15 mL via OROMUCOSAL

## 2017-08-07 MED ORDER — INSULIN REGULAR BOLUS VIA INFUSION
0.0000 [IU] | Freq: Three times a day (TID) | INTRAVENOUS | Status: DC
  Filled 2017-08-07: qty 10

## 2017-08-07 MED ORDER — SODIUM CHLORIDE 0.9 % IJ SOLN
INTRAMUSCULAR | Status: AC
  Filled 2017-08-07: qty 20

## 2017-08-07 MED ORDER — SODIUM CHLORIDE 0.9 % IV SOLN
INTRAVENOUS | Status: DC
  Administered 2017-08-07: 14:00:00 via INTRAVENOUS
  Administered 2017-08-17: 20 mL via INTRAVENOUS

## 2017-08-07 MED ORDER — PHENYLEPHRINE 40 MCG/ML (10ML) SYRINGE FOR IV PUSH (FOR BLOOD PRESSURE SUPPORT)
PREFILLED_SYRINGE | INTRAVENOUS | Status: DC | PRN
  Administered 2017-08-07: 160 ug via INTRAVENOUS

## 2017-08-07 MED ORDER — ONDANSETRON HCL 4 MG/2ML IJ SOLN
4.0000 mg | Freq: Four times a day (QID) | INTRAMUSCULAR | Status: DC | PRN
  Administered 2017-08-27: 4 mg via INTRAVENOUS
  Filled 2017-08-07: qty 2

## 2017-08-07 MED ORDER — PROTAMINE SULFATE 10 MG/ML IV SOLN
INTRAVENOUS | Status: AC
  Filled 2017-08-07: qty 25

## 2017-08-07 MED ORDER — SODIUM CHLORIDE 0.45 % IV SOLN
INTRAVENOUS | Status: DC | PRN
  Administered 2017-08-07: 14:00:00 via INTRAVENOUS

## 2017-08-07 MED FILL — Electrolyte-R (PH 7.4) Solution: INTRAVENOUS | Qty: 4000 | Status: AC

## 2017-08-07 MED FILL — Potassium Chloride Inj 2 mEq/ML: INTRAVENOUS | Qty: 40 | Status: AC

## 2017-08-07 MED FILL — Heparin Sodium (Porcine) Inj 1000 Unit/ML: INTRAMUSCULAR | Qty: 30 | Status: AC

## 2017-08-07 MED FILL — Magnesium Sulfate Inj 50%: INTRAMUSCULAR | Qty: 10 | Status: AC

## 2017-08-07 MED FILL — Lidocaine HCl IV Inj 20 MG/ML: INTRAVENOUS | Qty: 5 | Status: AC

## 2017-08-07 MED FILL — Sodium Bicarbonate IV Soln 8.4%: INTRAVENOUS | Qty: 50 | Status: AC

## 2017-08-07 MED FILL — Sodium Chloride IV Soln 0.9%: INTRAVENOUS | Qty: 2000 | Status: AC

## 2017-08-07 MED FILL — Mannitol IV Soln 20%: INTRAVENOUS | Qty: 500 | Status: AC

## 2017-08-07 SURGICAL SUPPLY — 137 items
ADAPTER CARDIO PERF ANTE/RETRO (ADAPTER) ×4 IMPLANT
ADPR PRFSN 84XANTGRD RTRGD (ADAPTER) ×2
AGENT HMST KT MTR STRL THRMB (HEMOSTASIS) ×2
APL SRG 7X2 LUM MLBL SLNT (VASCULAR PRODUCTS) ×2
APPLICATOR TIP COSEAL (VASCULAR PRODUCTS) ×2 IMPLANT
BAG DECANTER FOR FLEXI CONT (MISCELLANEOUS) ×4 IMPLANT
BANDAGE ACE 4X5 VEL STRL LF (GAUZE/BANDAGES/DRESSINGS) ×2 IMPLANT
BANDAGE ACE 6X5 VEL STRL LF (GAUZE/BANDAGES/DRESSINGS) ×2 IMPLANT
BANDAGE ELASTIC 4 VELCRO ST LF (GAUZE/BANDAGES/DRESSINGS) ×2 IMPLANT
BANDAGE ELASTIC 6 VELCRO ST LF (GAUZE/BANDAGES/DRESSINGS) ×2 IMPLANT
BASKET HEART  (ORDER IN 25'S) (MISCELLANEOUS) ×1
BASKET HEART (ORDER IN 25'S) (MISCELLANEOUS) ×1
BASKET HEART (ORDER IN 25S) (MISCELLANEOUS) ×2 IMPLANT
BLADE CLIPPER SURG (BLADE) ×2 IMPLANT
BLADE STERNUM SYSTEM 6 (BLADE) ×4 IMPLANT
BLADE SURG 11 STRL SS (BLADE) ×2 IMPLANT
BLADE SURG 12 STRL SS (BLADE) ×4 IMPLANT
BLOWER MISTER CAL-MED (MISCELLANEOUS) ×2 IMPLANT
BNDG GAUZE ELAST 4 BULKY (GAUZE/BANDAGES/DRESSINGS) ×4 IMPLANT
CANISTER SUCT 3000ML PPV (MISCELLANEOUS) ×4 IMPLANT
CANNULA GRAFT 8MMX50CM (Graft) ×2 IMPLANT
CANNULA GUNDRY RCSP 15FR (MISCELLANEOUS) ×4 IMPLANT
CATH CPB KIT VANTRIGT (MISCELLANEOUS) ×4 IMPLANT
CATH HEART VENT LEFT (CATHETERS) IMPLANT
CATH ROBINSON RED A/P 18FR (CATHETERS) ×12 IMPLANT
CATH THORACIC 36FR RT ANG (CATHETERS) ×4 IMPLANT
CAUTERY SURG HI TEMP FINE TIP (MISCELLANEOUS) ×2 IMPLANT
CLIP VESOCCLUDE SM WIDE 24/CT (CLIP) ×2 IMPLANT
CRADLE DONUT ADULT HEAD (MISCELLANEOUS) ×4 IMPLANT
DRAIN CHANNEL 32F RND 10.7 FF (WOUND CARE) ×4 IMPLANT
DRAPE CARDIOVASCULAR INCISE (DRAPES) ×4
DRAPE SLUSH/WARMER DISC (DRAPES) ×4 IMPLANT
DRAPE SRG 135X102X78XABS (DRAPES) ×2 IMPLANT
DRSG AQUACEL AG ADV 3.5X 6 (GAUZE/BANDAGES/DRESSINGS) ×2 IMPLANT
DRSG AQUACEL AG ADV 3.5X14 (GAUZE/BANDAGES/DRESSINGS) ×4 IMPLANT
ELECT BLADE 4.0 EZ CLEAN MEGAD (MISCELLANEOUS) ×4
ELECT BLADE 6.5 EXT (BLADE) ×4 IMPLANT
ELECT CAUTERY BLADE 6.4 (BLADE) ×4 IMPLANT
ELECT REM PT RETURN 9FT ADLT (ELECTROSURGICAL) ×8
ELECTRODE BLDE 4.0 EZ CLN MEGD (MISCELLANEOUS) ×2 IMPLANT
ELECTRODE REM PT RTRN 9FT ADLT (ELECTROSURGICAL) ×4 IMPLANT
FELT TEFLON 1X6 (MISCELLANEOUS) ×6 IMPLANT
GAUZE SPONGE 4X4 12PLY STRL (GAUZE/BANDAGES/DRESSINGS) ×6 IMPLANT
GAUZE SPONGE 4X4 12PLY STRL LF (GAUZE/BANDAGES/DRESSINGS) ×4 IMPLANT
GLOVE BIO SURGEON STRL SZ 6.5 (GLOVE) ×4 IMPLANT
GLOVE BIO SURGEON STRL SZ7.5 (GLOVE) ×12 IMPLANT
GLOVE BIO SURGEONS STRL SZ 6.5 (GLOVE) ×4
GLOVE BIOGEL PI IND STRL 6 (GLOVE) IMPLANT
GLOVE BIOGEL PI IND STRL 6.5 (GLOVE) IMPLANT
GLOVE BIOGEL PI INDICATOR 6 (GLOVE) ×2
GLOVE BIOGEL PI INDICATOR 6.5 (GLOVE) ×4
GLOVE SURG SS PI 6.0 STRL IVOR (GLOVE) ×4 IMPLANT
GOWN STRL REUS W/ TWL LRG LVL3 (GOWN DISPOSABLE) ×8 IMPLANT
GOWN STRL REUS W/TWL LRG LVL3 (GOWN DISPOSABLE) ×24
HANDLE STAPLE ENDO GIA SHORT (STAPLE) ×2
HEMOSTAT POWDER SURGIFOAM 1G (HEMOSTASIS) ×12 IMPLANT
HEMOSTAT SURGICEL 2X14 (HEMOSTASIS) ×4 IMPLANT
INSERT FOGARTY XLG (MISCELLANEOUS) IMPLANT
KIT BASIN OR (CUSTOM PROCEDURE TRAY) ×4 IMPLANT
KIT ROOM TURNOVER OR (KITS) ×4 IMPLANT
KIT SUCTION CATH 14FR (SUCTIONS) ×4 IMPLANT
KIT VASOVIEW HEMOPRO VH 3000 (KITS) ×4 IMPLANT
LEAD PACING MYOCARDI (MISCELLANEOUS) ×4 IMPLANT
LINE VENT (MISCELLANEOUS) ×2 IMPLANT
LOOP VESSEL MAXI BLUE (MISCELLANEOUS) ×2 IMPLANT
MARKER GRAFT CORONARY BYPASS (MISCELLANEOUS) ×12 IMPLANT
NS IRRIG 1000ML POUR BTL (IV SOLUTION) ×20 IMPLANT
PACK OPEN HEART (CUSTOM PROCEDURE TRAY) ×4 IMPLANT
PAD ARMBOARD 7.5X6 YLW CONV (MISCELLANEOUS) ×8 IMPLANT
PAD ELECT DEFIB RADIOL ZOLL (MISCELLANEOUS) ×4 IMPLANT
PENCIL BUTTON HOLSTER BLD 10FT (ELECTRODE) ×4 IMPLANT
PUNCH AORTIC ROT 4.0MM RCL 40 (MISCELLANEOUS) ×2 IMPLANT
PUNCH AORTIC ROTATE  4.5MM 8IN (MISCELLANEOUS) ×2 IMPLANT
PUNCH AORTIC ROTATE 4.0MM (MISCELLANEOUS) IMPLANT
PUNCH AORTIC ROTATE 4.5MM 8IN (MISCELLANEOUS) IMPLANT
PUNCH AORTIC ROTATE 5MM 8IN (MISCELLANEOUS) IMPLANT
RELOAD TRI 2.0 30 VAS MED SUL (STAPLE) ×2 IMPLANT
SEALANT SURG COSEAL 8ML (VASCULAR PRODUCTS) ×2 IMPLANT
SET CARDIOPLEGIA MPS 5001102 (MISCELLANEOUS) ×2 IMPLANT
SPONGE LAP 18X18 X RAY DECT (DISPOSABLE) ×4 IMPLANT
SPONGE LAP 4X18 X RAY DECT (DISPOSABLE) ×2 IMPLANT
STAPLER ENDO GIA 12 SHRT THIN (STAPLE) IMPLANT
STAPLER ENDO GIA 12MM SHORT (STAPLE) ×2 IMPLANT
STAPLER VISISTAT 35W (STAPLE) ×2 IMPLANT
SURGIFLO W/THROMBIN 8M KIT (HEMOSTASIS) ×6 IMPLANT
SUT BONE WAX W31G (SUTURE) ×4 IMPLANT
SUT ETHIBOND 2 0 SH (SUTURE) ×16
SUT ETHIBOND 2 0 SH 36X2 (SUTURE) IMPLANT
SUT MNCRL AB 4-0 PS2 18 (SUTURE) ×2 IMPLANT
SUT PROLENE 3 0 RB 1 (SUTURE) ×2 IMPLANT
SUT PROLENE 3 0 SH DA (SUTURE) IMPLANT
SUT PROLENE 3 0 SH1 36 (SUTURE) IMPLANT
SUT PROLENE 4 0 RB 1 (SUTURE) ×8
SUT PROLENE 4 0 SH DA (SUTURE) ×4 IMPLANT
SUT PROLENE 4-0 RB1 .5 CRCL 36 (SUTURE) ×2 IMPLANT
SUT PROLENE 5 0 C 1 36 (SUTURE) ×8 IMPLANT
SUT PROLENE 6 0 C 1 30 (SUTURE) ×6 IMPLANT
SUT PROLENE 6 0 CC (SUTURE) ×12 IMPLANT
SUT PROLENE 8 0 BV175 6 (SUTURE) ×20 IMPLANT
SUT PROLENE BLUE 7 0 (SUTURE) ×4 IMPLANT
SUT PROLENE POLY MONO (SUTURE) ×2 IMPLANT
SUT SILK  1 MH (SUTURE) ×6
SUT SILK 1 MH (SUTURE) IMPLANT
SUT SILK 1 TIES 10X30 (SUTURE) ×2 IMPLANT
SUT SILK 2 0 SH CR/8 (SUTURE) ×6 IMPLANT
SUT SILK 2 0 TIES 10X30 (SUTURE) ×2 IMPLANT
SUT SILK 2 0 TIES 17X18 (SUTURE) ×4
SUT SILK 2-0 18XBRD TIE BLK (SUTURE) IMPLANT
SUT SILK 3 0 SH CR/8 (SUTURE) ×2 IMPLANT
SUT SILK 4 0 TIE 10X30 (SUTURE) ×4 IMPLANT
SUT STEEL 6MS V (SUTURE) ×6 IMPLANT
SUT STEEL SZ 6 DBL 3X14 BALL (SUTURE) ×6 IMPLANT
SUT TEM PAC WIRE 2 0 SH (SUTURE) ×8 IMPLANT
SUT VIC AB 1 CTX 18 (SUTURE) ×4 IMPLANT
SUT VIC AB 1 CTX 36 (SUTURE) ×12
SUT VIC AB 1 CTX36XBRD ANBCTR (SUTURE) ×4 IMPLANT
SUT VIC AB 2-0 CT1 27 (SUTURE) ×4
SUT VIC AB 2-0 CT1 TAPERPNT 27 (SUTURE) IMPLANT
SUT VIC AB 2-0 CTX 27 (SUTURE) ×2 IMPLANT
SUT VIC AB 3-0 X1 27 (SUTURE) ×4 IMPLANT
SUT VICRYL 2-0 COATED 36IN (SUTURE) ×4 IMPLANT
SUTURE E-PAK OPEN HEART (SUTURE) ×2 IMPLANT
SYSTEM SAHARA CHEST DRAIN ATS (WOUND CARE) ×4 IMPLANT
TAPE CLOTH SURG 4X10 WHT LF (GAUZE/BANDAGES/DRESSINGS) ×4 IMPLANT
TAPE PAPER 2X10 WHT MICROPORE (GAUZE/BANDAGES/DRESSINGS) ×2 IMPLANT
TOWEL GREEN STERILE (TOWEL DISPOSABLE) ×10 IMPLANT
TOWEL GREEN STERILE FF (TOWEL DISPOSABLE) ×6 IMPLANT
TOWEL OR 17X24 6PK STRL BLUE (TOWEL DISPOSABLE) ×4 IMPLANT
TOWEL OR 17X26 10 PK STRL BLUE (TOWEL DISPOSABLE) ×4 IMPLANT
TRAY FOLEY SILVER 16FR TEMP (SET/KITS/TRAYS/PACK) ×4 IMPLANT
TUBE CONNECTING 12'X1/4 (SUCTIONS) ×1
TUBE CONNECTING 12X1/4 (SUCTIONS) ×1 IMPLANT
TUBING INSUFFLATION (TUBING) ×4 IMPLANT
UNDERPAD 30X30 (UNDERPADS AND DIAPERS) ×4 IMPLANT
VENT LEFT HEART 12002 (CATHETERS) ×4
WATER STERILE IRR 1000ML POUR (IV SOLUTION) ×8 IMPLANT
YANKAUER SUCT BULB TIP NO VENT (SUCTIONS) ×2 IMPLANT

## 2017-08-07 NOTE — Progress Notes (Signed)
Pt transported on vent from OR to 2H26.  Pt's vitals remained stable throughout.

## 2017-08-07 NOTE — Anesthesia Procedure Notes (Signed)
Procedure Name: Intubation Date/Time: 08/07/2017 7:53 AM Performed by: Candis Shine Pre-anesthesia Checklist: Patient identified, Emergency Drugs available, Suction available and Patient being monitored Patient Re-evaluated:Patient Re-evaluated prior to induction Oxygen Delivery Method: Circle System Utilized Preoxygenation: Pre-oxygenation with 100% oxygen Induction Type: IV induction Ventilation: Two handed mask ventilation required and Oral airway inserted - appropriate to patient size Laryngoscope Size: Mac and 3 Grade View: Grade I Tube type: Oral Tube size: 8.0 mm Number of attempts: 1 Airway Equipment and Method: Stylet and Oral airway Placement Confirmation: ETT inserted through vocal cords under direct vision,  positive ETCO2 and breath sounds checked- equal and bilateral Secured at: 22 cm Tube secured with: Tape Dental Injury: Teeth and Oropharynx as per pre-operative assessment  Difficulty Due To: Difficulty was unanticipated

## 2017-08-07 NOTE — Brief Op Note (Addendum)
07/31/2017 - 08/07/2017  12:00 PM  PATIENT:  Stanley Robles  83 y.o. male  PRE-OPERATIVE DIAGNOSIS:  CAD  POST-OPERATIVE DIAGNOSIS:  CAD  PROCEDURE:  Procedure(s): CORONARY ARTERY BYPASS GRAFTING (CABG) x 2 WITH ENDOSCOPIC HARVESTING OF RIGHT SAPHENOUS VEIN. 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   Delay start of Pharmacological VTE agent (>24hrs) due to surgical blood loss or risk of bleeding: yes

## 2017-08-07 NOTE — Procedures (Signed)
Extubation Procedure Note  Patient Details:   Name: Stanley Robles DOB: [birth year] MRN: [id number]   Airway Documentation:  Airway 8 mm (Active)  Secured at (cm) 22 cm 08/07/2017  9:38 PM  Measured From Lips 08/07/2017  9:38 PM  Secured Location Right 08/07/2017  9:38 PM  Secured By Pink Tape 08/07/2017  9:38 PM  Tube Holder Repositioned Yes 08/07/2017  7:50 PM  Site Condition Dry 08/07/2017  4:05 PM    Evaluation  O2 sats: stable throughout Complications: No apparent complications Patient did tolerate procedure well. Bilateral Breath Sounds: Clear, Diminished   Yes   Patient performed a NIF of -20 and a FVC .500 mL.  Positive cuff leak and patient was extubated to 4 L [place] .  Patient was only able to perform IS of 250-350 ml after several attempts.   Carrington Clamp A 08/07/2017, 10:15 PM

## 2017-08-07 NOTE — OR Nursing (Signed)
0800 Upon providing perineal care and retracting foreskin prior to inserting indwelling foley catheter; pt. was noted to have build of  whitish-tan debris around rim of penis.  Area cleansed with wipes provided in cath insertion kit and wiped with NaCl. Minute amt. bleeding and redness noted after cleansing. Foreskin replaced after #16 fr/10 ml latex temp probe foley placed without difficulty under aseptic technique per D. Jonetta Speak, RNFA.

## 2017-08-07 NOTE — Anesthesia Procedure Notes (Signed)
Arterial Line Insertion Start/End9/14/2018 6:47 AM, 08/07/2017 6:53 AM Performed by: Candis Shine, CRNA  Patient location: Pre-op. Preanesthetic checklist: patient identified, IV checked, site marked, risks and benefits discussed, surgical consent, monitors and equipment checked, pre-op evaluation, timeout performed and anesthesia consent Patient sedated radial was placed Catheter size: 18 G Hand hygiene performed  and maximum sterile barriers used   Attempts: 1 Procedure performed without using ultrasound guided technique. Following insertion, dressing applied and Biopatch. Post procedure assessment: normal  Patient tolerated the procedure well with no immediate complications.

## 2017-08-07 NOTE — Progress Notes (Signed)
  Echocardiogram Echocardiogram Transesophageal has been performed.  Stanley Robles 08/07/2017, 9:42 AM

## 2017-08-07 NOTE — Transfer of Care (Signed)
Immediate Anesthesia Transfer of Care Note  Patient: Stanley Robles  Procedure(s) Performed: Procedure(s): CORONARY ARTERY BYPASS GRAFTING (CABG) x 2 WITH ENDOSCOPIC HARVESTING OF RIGHT SAPHENOUS VEIN. (N/A) TRANSESOPHAGEAL ECHOCARDIOGRAM (TEE) (N/A)  Patient Location: ICU  Anesthesia Type:General  Level of Consciousness: sedated and Patient remains intubated per anesthesia plan  Airway & Oxygen Therapy: Patient remains intubated per anesthesia plan and Patient placed on Ventilator (see vital sign flow sheet for setting)  Post-op Assessment: Report given to RN and Post -op Vital signs reviewed and stable  Post vital signs: Reviewed and stable  Last Vitals:  Vitals:   08/07/17 0700 08/07/17 0701  BP:    Pulse: 64 62  Resp: (!) 24 18  Temp:    SpO2: 100% 100%    Last Pain:  Vitals:   08/07/17 0456  TempSrc: Oral  PainSc:       Patients Stated Pain Goal: 0 (66/29/47 6546)  Complications: No apparent anesthesia complications

## 2017-08-07 NOTE — Progress Notes (Signed)
RT asked to bring vent to OR for pt. Due to desat issues. Vent settings per Dr. Prescott Gum.

## 2017-08-07 NOTE — OR Nursing (Signed)
[facility] in to perform chest X-Ray. Dr. Prescott Gum in to review results.

## 2017-08-07 NOTE — Progress Notes (Signed)
Pre Procedure note for inpatients:   Stanley Robles has been scheduled for Procedure(s): CORONARY ARTERY BYPASS GRAFTING (CABG) (N/A) TRANSESOPHAGEAL ECHOCARDIOGRAM (TEE) (N/A) today. The various methods of treatment have been discussed with the patient. After consideration of the risks, benefits and treatment options the patient has consented to the planned procedure.   The patient has been seen and labs reviewed. There are no changes in the patient's condition to prevent proceeding with the planned procedure today.  Recent labs:  Lab Results  Component Value Date   WBC 5.0 08/07/2017   HGB 15.2 08/07/2017   HCT 44.3 08/07/2017   PLT 93 (L) 08/07/2017   GLUCOSE 86 08/07/2017   CHOL 162 02/28/2015   TRIG 145 02/28/2015   HDL 31 (L) 02/28/2015   LDLCALC 102 (H) 02/28/2015   ALT 17 08/04/2017   AST 24 08/04/2017   NA 135 08/07/2017   K 4.0 08/07/2017   CL 105 08/07/2017   CREATININE 1.68 (H) 08/07/2017   BUN 23 (H) 08/07/2017   CO2 21 (L) 08/07/2017   TSH 3.035 08/04/2017   INR 1.05 08/04/2017   HGBA1C 5.3 08/04/2017    Len Childs, MD 08/07/2017 7:26 AM

## 2017-08-07 NOTE — Progress Notes (Signed)
CT surgery p.m. Rounds  Sedated remains on ventilator after previous CABG 2 Preoperative stroke, chronic renal failure, ITP with low platelet count Urine output remains adequate postop Patient responds to commands but still sedated Minimal chest tube output Postop labs satisfactory including platelet count

## 2017-08-07 NOTE — Anesthesia Procedure Notes (Signed)
Central Venous Catheter Insertion Performed by: Roderic Palau, anesthesiologist Start/End9/14/2018 6:54 AM, 08/07/2017 7:04 AM Patient location: Pre-op. Preanesthetic checklist: patient identified, IV checked, site marked, risks and benefits discussed, surgical consent, monitors and equipment checked, pre-op evaluation, timeout performed and anesthesia consent Position: Trendelenburg Lidocaine 1% used for infiltration and patient sedated Hand hygiene performed , maximum sterile barriers used  and Seldinger technique used Catheter size: 9 Fr Total catheter length 10. Central line and PA cath was placed.MAC introducer Swan type:thermodilution PA Cath depth:45 Procedure performed using ultrasound guided technique. Ultrasound Notes:anatomy identified, needle tip was noted to be adjacent to the nerve/plexus identified, no ultrasound evidence of intravascular and/or intraneural injection and image(s) printed for medical record Attempts: 1 Following insertion, line sutured and dressing applied. Post procedure assessment: blood return through all ports, free fluid flow and no air  Patient tolerated the procedure well with no immediate complications.

## 2017-08-08 ENCOUNTER — Inpatient Hospital Stay (HOSPITAL_COMMUNITY): Payer: Medicare Other

## 2017-08-08 LAB — PREPARE PLATELET PHERESIS
Unit division: 0
Unit division: 0

## 2017-08-08 LAB — POCT I-STAT 3, VENOUS BLOOD GAS (G3P V)
Acid-base deficit: 5 mmol/L — ABNORMAL HIGH (ref 0.0–2.0)
Bicarbonate: 19.9 mmol/L — ABNORMAL LOW (ref 20.0–28.0)
O2 Saturation: 41 %
PCO2 VEN: 32.8 mmHg — AB (ref 44.0–60.0)
PH VEN: 7.39 (ref 7.250–7.430)
TCO2: 21 mmol/L — ABNORMAL LOW (ref 22–32)
pO2, Ven: 23 mmHg — CL (ref 32.0–45.0)

## 2017-08-08 LAB — COOXEMETRY PANEL
Carboxyhemoglobin: 1.5 % (ref 0.5–1.5)
Methemoglobin: 0.8 % (ref 0.0–1.5)
O2 Saturation: 61.3 %
Total hemoglobin: 8.6 g/dL — ABNORMAL LOW (ref 12.0–16.0)

## 2017-08-08 LAB — CBC
HCT: 24.5 % — ABNORMAL LOW (ref 39.0–52.0)
HEMATOCRIT: 25.4 % — AB (ref 39.0–52.0)
HEMOGLOBIN: 8.7 g/dL — AB (ref 13.0–17.0)
Hemoglobin: 8.3 g/dL — ABNORMAL LOW (ref 13.0–17.0)
MCH: 34.5 pg — AB (ref 26.0–34.0)
MCH: 34.3 pg — ABNORMAL HIGH (ref 26.0–34.0)
MCHC: 34.3 g/dL (ref 30.0–36.0)
MCHC: 33.9 g/dL (ref 30.0–36.0)
MCV: 100.8 fL — AB (ref 78.0–100.0)
MCV: 101.2 fL — ABNORMAL HIGH (ref 78.0–100.0)
Platelets: 100 10*3/uL — ABNORMAL LOW (ref 150–400)
Platelets: 100 10*3/uL — ABNORMAL LOW (ref 150–400)
RBC: 2.52 MIL/uL — ABNORMAL LOW (ref 4.22–5.81)
RBC: 2.42 MIL/uL — ABNORMAL LOW (ref 4.22–5.81)
RDW: 14.8 % (ref 11.5–15.5)
RDW: 14.8 % (ref 11.5–15.5)
WBC: 7.8 10*3/uL (ref 4.0–10.5)
WBC: 10 10*3/uL (ref 4.0–10.5)

## 2017-08-08 LAB — BASIC METABOLIC PANEL
Anion gap: 7 (ref 5–15)
Anion gap: 9 (ref 5–15)
BUN: 17 mg/dL (ref 6–20)
BUN: 19 mg/dL (ref 6–20)
CALCIUM: 7.9 mg/dL — AB (ref 8.9–10.3)
CHLORIDE: 106 mmol/L (ref 101–111)
CO2: 22 mmol/L (ref 22–32)
CO2: 22 mmol/L (ref 22–32)
CREATININE: 1.48 mg/dL — AB (ref 0.61–1.24)
Calcium: 8 mg/dL — ABNORMAL LOW (ref 8.9–10.3)
Chloride: 103 mmol/L (ref 101–111)
Creatinine, Ser: 2.21 mg/dL — ABNORMAL HIGH (ref 0.61–1.24)
GFR calc Af Amer: 30 mL/min — ABNORMAL LOW (ref >60)
GFR calc non Af Amer: 42 mL/min — ABNORMAL LOW (ref >60)
GFR calc non Af Amer: 26 mL/min — ABNORMAL LOW (ref >60)
GFR, EST AFRICAN AMERICAN: 49 mL/min — AB (ref >60)
GLUCOSE: 108 mg/dL — AB (ref 65–99)
Glucose, Bld: 153 mg/dL — ABNORMAL HIGH (ref 65–99)
Potassium: 3.7 mmol/L (ref 3.5–5.1)
Potassium: 4 mmol/L (ref 3.5–5.1)
Sodium: 135 mmol/L (ref 135–145)
Sodium: 134 mmol/L — ABNORMAL LOW (ref 135–145)

## 2017-08-08 LAB — GLUCOSE, CAPILLARY
GLUCOSE-CAPILLARY: 102 mg/dL — AB (ref 65–99)
GLUCOSE-CAPILLARY: 107 mg/dL — AB (ref 65–99)
GLUCOSE-CAPILLARY: 116 mg/dL — AB (ref 65–99)
GLUCOSE-CAPILLARY: 148 mg/dL — AB (ref 65–99)
GLUCOSE-CAPILLARY: 86 mg/dL (ref 65–99)
GLUCOSE-CAPILLARY: 91 mg/dL (ref 65–99)
Glucose-Capillary: 110 mg/dL — ABNORMAL HIGH (ref 65–99)
Glucose-Capillary: 107 mg/dL — ABNORMAL HIGH (ref 65–99)
Glucose-Capillary: 115 mg/dL — ABNORMAL HIGH (ref 65–99)
Glucose-Capillary: 93 mg/dL (ref 65–99)
Glucose-Capillary: 96 mg/dL (ref 65–99)
Glucose-Capillary: 102 mg/dL — ABNORMAL HIGH (ref 65–99)

## 2017-08-08 LAB — POCT I-STAT, CHEM 8
BUN: 20 mg/dL (ref 6–20)
Calcium, Ion: 1.17 mmol/L (ref 1.15–1.40)
Chloride: 99 mmol/L — ABNORMAL LOW (ref 101–111)
Creatinine, Ser: 1.8 mg/dL — ABNORMAL HIGH (ref 0.61–1.24)
Glucose, Bld: 152 mg/dL — ABNORMAL HIGH (ref 65–99)
HEMATOCRIT: 24 % — AB (ref 39.0–52.0)
HEMOGLOBIN: 8.2 g/dL — AB (ref 13.0–17.0)
POTASSIUM: 4.1 mmol/L (ref 3.5–5.1)
Sodium: 137 mmol/L (ref 135–145)
TCO2: 22 mmol/L (ref 22–32)

## 2017-08-08 LAB — BPAM PLATELET PHERESIS
Blood Product Expiration Date: 201809142359
Blood Product Expiration Date: 201809152359
ISSUE DATE / TIME: 201809141208
ISSUE DATE / TIME: 201809141208
Unit Type and Rh: 6200
Unit Type and Rh: 8400

## 2017-08-08 LAB — MAGNESIUM
Magnesium: 2.4 mg/dL (ref 1.7–2.4)
Magnesium: 2.4 mg/dL (ref 1.7–2.4)

## 2017-08-08 LAB — BPAM CRYOPRECIPITATE
Blood Product Expiration Date: 201809141741
ISSUE DATE / TIME: 201809141208
Unit Type and Rh: 5100

## 2017-08-08 LAB — PREPARE FRESH FROZEN PLASMA: Unit division: 0

## 2017-08-08 LAB — POCT I-STAT 3, ART BLOOD GAS (G3+)
ACID-BASE DEFICIT: 1 mmol/L (ref 0.0–2.0)
Acid-base deficit: 1 mmol/L (ref 0.0–2.0)
Acid-base deficit: 2 mmol/L (ref 0.0–2.0)
BICARBONATE: 22.3 mmol/L (ref 20.0–28.0)
BICARBONATE: 20 mmol/L (ref 20.0–28.0)
Bicarbonate: 21.7 mmol/L (ref 20.0–28.0)
O2 Saturation: 93 %
O2 Saturation: 94 %
O2 Saturation: 99 %
PCO2 ART: 29.1 mmHg — AB (ref 32.0–48.0)
PCO2 ART: 32.4 mmHg (ref 32.0–48.0)
PH ART: 7.481 — AB (ref 7.350–7.450)
PH ART: 7.445 (ref 7.350–7.450)
PO2 ART: 61 mmHg — AB (ref 83.0–108.0)
Patient temperature: 36.9
TCO2: 23 mmol/L (ref 22–32)
TCO2: 23 mmol/L (ref 22–32)
TCO2: 21 mmol/L — ABNORMAL LOW (ref 22–32)
pCO2 arterial: 23.3 mmHg — ABNORMAL LOW (ref 32.0–48.0)
pH, Arterial: 7.538 — ABNORMAL HIGH (ref 7.350–7.450)
pO2, Arterial: 67 mmHg — ABNORMAL LOW (ref 83.0–108.0)
pO2, Arterial: 109 mmHg — ABNORMAL HIGH (ref 83.0–108.0)

## 2017-08-08 LAB — PREPARE CRYOPRECIPITATE: Unit division: 0

## 2017-08-08 LAB — BPAM FFP
Blood Product Expiration Date: 201809152359
ISSUE DATE / TIME: 201809141208
Unit Type and Rh: 6200

## 2017-08-08 MED ORDER — AMIODARONE HCL IN DEXTROSE 360-4.14 MG/200ML-% IV SOLN
30.0000 mg/h | INTRAVENOUS | Status: AC
  Administered 2017-08-09 – 2017-08-10 (×5): 30 mg/h via INTRAVENOUS
  Filled 2017-08-08 (×6): qty 200

## 2017-08-08 MED ORDER — SODIUM BICARBONATE 8.4 % IV SOLN
50.0000 meq | Freq: Once | INTRAVENOUS | Status: AC
  Administered 2017-08-08: 50 meq via INTRAVENOUS

## 2017-08-08 MED ORDER — NALOXONE HCL 0.4 MG/ML IJ SOLN
INTRAMUSCULAR | Status: AC
  Administered 2017-08-08: 0.2 mg via INTRAVENOUS
  Filled 2017-08-08: qty 1

## 2017-08-08 MED ORDER — ALBUMIN HUMAN 5 % IV SOLN
INTRAVENOUS | Status: AC
  Administered 2017-08-08: 12.5 g
  Filled 2017-08-08: qty 250

## 2017-08-08 MED ORDER — FUROSEMIDE 10 MG/ML IJ SOLN
20.0000 mg | Freq: Two times a day (BID) | INTRAMUSCULAR | Status: DC
  Administered 2017-08-08 (×2): 20 mg via INTRAVENOUS
  Filled 2017-08-08 (×2): qty 2

## 2017-08-08 MED ORDER — DOPAMINE-DEXTROSE 3.2-5 MG/ML-% IV SOLN
0.0000 ug/kg/min | INTRAVENOUS | Status: DC
  Administered 2017-08-10 – 2017-08-13 (×2): 2 ug/kg/min via INTRAVENOUS
  Filled 2017-08-08 (×3): qty 250

## 2017-08-08 MED ORDER — INSULIN ASPART 100 UNIT/ML ~~LOC~~ SOLN
0.0000 [IU] | SUBCUTANEOUS | Status: DC
  Administered 2017-08-08: 1 [IU] via SUBCUTANEOUS
  Administered 2017-08-08: 2 [IU] via SUBCUTANEOUS
  Administered 2017-08-08: 4 [IU] via SUBCUTANEOUS
  Administered 2017-08-09 – 2017-08-14 (×8): 2 [IU] via SUBCUTANEOUS

## 2017-08-08 MED ORDER — NALOXONE HCL 0.4 MG/ML IJ SOLN
0.2000 mg | INTRAMUSCULAR | Status: DC | PRN
  Administered 2017-08-08 (×3): 0.2 mg via INTRAVENOUS

## 2017-08-08 MED ORDER — INSULIN DETEMIR 100 UNIT/ML ~~LOC~~ SOLN
10.0000 [IU] | Freq: Two times a day (BID) | SUBCUTANEOUS | Status: DC
  Administered 2017-08-08 – 2017-08-10 (×5): 10 [IU] via SUBCUTANEOUS
  Filled 2017-08-08 (×7): qty 0.1

## 2017-08-08 MED ORDER — NALOXONE HCL 0.4 MG/ML IJ SOLN
INTRAMUSCULAR | Status: AC
  Filled 2017-08-08: qty 1

## 2017-08-08 MED ORDER — AMIODARONE IV BOLUS ONLY 150 MG/100ML
150.0000 mg | Freq: Once | INTRAVENOUS | Status: AC
  Administered 2017-08-08: 150 mg via INTRAVENOUS

## 2017-08-08 MED ORDER — AMIODARONE HCL IN DEXTROSE 360-4.14 MG/200ML-% IV SOLN
60.0000 mg/h | INTRAVENOUS | Status: AC
  Administered 2017-08-08 (×2): 60 mg/h via INTRAVENOUS
  Filled 2017-08-08: qty 200

## 2017-08-08 NOTE — Progress Notes (Signed)
RT called to bedside to place pt on BIPAP. Pt shallow breathing, lethargic and uneasy to arouse. Placed pt on BIPAP and immediately got an ABG. Adjusted settings on BIPAP according to ABG results and pt became more awake and alert after minutes of BIPAP and narcan that RN administered, IPAP 16. EPAP 8. RR 12. 40%. PT's VS stable and BBS clear and diminished.  RT will cont to monitor.

## 2017-08-08 NOTE — Progress Notes (Signed)
1 Day Post-Op Procedure(s) (LRB): CORONARY ARTERY BYPASS GRAFTING (CABG) x 2 WITH ENDOSCOPIC HARVESTING OF RIGHT SAPHENOUS VEIN. (N/A) TRANSESOPHAGEAL ECHOCARDIOGRAM (TEE) (N/A) Subjective: Extubated, neuro intact Renal fx stable  Objective: Vital signs in last 24 hours: Temp:  [94.1 F (34.5 C)-99.1 F (37.3 C)] 98.2 F (36.8 C) (09/15 0715) Pulse Rate:  [87-104] 88 (09/15 0715) Cardiac Rhythm: Normal sinus rhythm (09/15 0500) Resp:  [12-26] 22 (09/15 0715) BP: (102-154)/(39-65) 115/62 (09/15 0700) SpO2:  [93 %-100 %] 100 % (09/15 0835) Arterial Line BP: (101-213)/(34-58) 166/47 (09/15 0715) FiO2 (%):  [40 %-100 %] 45 % (09/15 0300) Weight:  [186 lb 14.4 oz (84.8 kg)] 186 lb 14.4 oz (84.8 kg) (09/15 0545)  Hemodynamic parameters for last 24 hours: PAP: (20-36)/(7-17) 30/13 CVP:  [9 mmHg-13 mmHg] 10 mmHg CO:  [3.5 L/min-5.6 L/min] 4.7 L/min CI:  [1.8 L/min/m2-3 L/min/m2] 2.5 L/min/m2  Intake/Output from previous day: 09/14 0701 - 09/15 0700 In: 6701.6 [I.V.:3831.6; Blood:1370; IV Piggyback:1500] Out: 3419 [Urine:4310; Blood:1000; Chest Tube:410] Intake/Output this shift: Total I/O In: -  Out: 75 [Urine:75]       Exam    General- alert and comfortable   Lungs- clear without rales, wheezes   Cor- regular rate and rhythm, no murmur , gallop   Abdomen- soft, non-tender   Extremities - warm, non-tender, minimal edema   Neuro- oriented, appropriate, no focal weakness   Lab Results:  Recent Labs  08/07/17 2000 08/07/17 2004 08/08/17 0428  WBC 7.0  --  7.8  HGB 9.1* 7.8* 8.7*  HCT 26.1* 23.0* 25.4*  PLT 107*  --  100*   BMET:  Recent Labs  08/07/17 0518  08/07/17 2004 08/08/17 0428  NA 135  < > 136 135  K 4.0  < > 3.9 3.7  CL 105  < > 101 106  CO2 21*  --   --  22  GLUCOSE 86  < > 143* 108*  BUN 23*  < > 17 17  CREATININE 1.68*  < > 1.20 1.48*  CALCIUM 8.8*  --   --  7.9*  < > = values in this interval not displayed.  PT/INR:  Recent Labs   08/07/17 1350  LABPROT 17.8*  INR 1.48   ABG    Component Value Date/Time   PHART 7.486 (H) 08/07/2017 2319   HCO3 21.5 08/07/2017 2319   TCO2 22 08/07/2017 2319   ACIDBASEDEF 1.0 08/07/2017 2319   O2SAT 61.3 08/08/2017 0455   CBG (last 3)   Recent Labs  08/08/17 0614 08/08/17 0724 08/08/17 0813  GLUCAP 107* 115* 107*    Assessment/Plan: S/P Procedure(s) (LRB): CORONARY ARTERY BYPASS GRAFTING (CABG) x 2 WITH ENDOSCOPIC HARVESTING OF RIGHT SAPHENOUS VEIN. (N/A) TRANSESOPHAGEAL ECHOCARDIOGRAM (TEE) (N/A) Mobilize Diuresis Diabetes control See progression orders   LOS: 8 days    Tharon Aquas Trigt III 08/08/2017

## 2017-08-08 NOTE — Progress Notes (Signed)
RT note- orders to get ABG, unable, VBG performed, PH and pCo2 WNL. Po2, low-23. Increased fio2 6l/min [place]. Continue to monitor.

## 2017-08-08 NOTE — Progress Notes (Signed)
Hematology: P.O d 1 CABG in man with chronic, mild, thrombocytopenia. EBL 1,000 ml during surgery Intra-op transfusion 2 units platelets, 1 FFP, cryoprecipitate, tranexamic acid Nadir Hb 7.8 c/w pre-op 15.2; current  Hb 8.7 post 2 units RBCs Platelet count 100,000 = his approx baseline Chest tube: 410 ml/24 hrs Impression: Chronic thrombocytopenia Stable 24 hr post CABG

## 2017-08-09 ENCOUNTER — Inpatient Hospital Stay (HOSPITAL_COMMUNITY): Payer: Medicare Other

## 2017-08-09 LAB — COOXEMETRY PANEL
Carboxyhemoglobin: 1.4 % (ref 0.5–1.5)
Methemoglobin: 0.9 % (ref 0.0–1.5)
O2 Saturation: 64.3 %
Total hemoglobin: 8.9 g/dL — ABNORMAL LOW (ref 12.0–16.0)

## 2017-08-09 LAB — BASIC METABOLIC PANEL
Anion gap: 8 (ref 5–15)
BUN: 22 mg/dL — ABNORMAL HIGH (ref 6–20)
CO2: 24 mmol/L (ref 22–32)
Calcium: 8.3 mg/dL — ABNORMAL LOW (ref 8.9–10.3)
Chloride: 104 mmol/L (ref 101–111)
Creatinine, Ser: 2.38 mg/dL — ABNORMAL HIGH (ref 0.61–1.24)
GFR calc Af Amer: 28 mL/min — ABNORMAL LOW (ref >60)
GFR calc non Af Amer: 24 mL/min — ABNORMAL LOW (ref >60)
Glucose, Bld: 116 mg/dL — ABNORMAL HIGH (ref 65–99)
Potassium: 3.6 mmol/L (ref 3.5–5.1)
Sodium: 136 mmol/L (ref 135–145)

## 2017-08-09 LAB — GLUCOSE, CAPILLARY
GLUCOSE-CAPILLARY: 106 mg/dL — AB (ref 65–99)
Glucose-Capillary: 109 mg/dL — ABNORMAL HIGH (ref 65–99)
Glucose-Capillary: 120 mg/dL — ABNORMAL HIGH (ref 65–99)
Glucose-Capillary: 150 mg/dL — ABNORMAL HIGH (ref 65–99)

## 2017-08-09 LAB — CBC
HCT: 25.4 % — ABNORMAL LOW (ref 39.0–52.0)
Hemoglobin: 8.6 g/dL — ABNORMAL LOW (ref 13.0–17.0)
MCH: 33.7 pg (ref 26.0–34.0)
MCHC: 33.9 g/dL (ref 30.0–36.0)
MCV: 99.6 fL (ref 78.0–100.0)
Platelets: 99 10*3/uL — ABNORMAL LOW (ref 150–400)
RBC: 2.55 MIL/uL — ABNORMAL LOW (ref 4.22–5.81)
RDW: 14.6 % (ref 11.5–15.5)
WBC: 11.1 10*3/uL — ABNORMAL HIGH (ref 4.0–10.5)

## 2017-08-09 LAB — POCT I-STAT, CHEM 8
BUN: 24 mg/dL — AB (ref 6–20)
CALCIUM ION: 1.15 mmol/L (ref 1.15–1.40)
CREATININE: 2.5 mg/dL — AB (ref 0.61–1.24)
Chloride: 98 mmol/L — ABNORMAL LOW (ref 101–111)
Glucose, Bld: 116 mg/dL — ABNORMAL HIGH (ref 65–99)
HCT: 26 % — ABNORMAL LOW (ref 39.0–52.0)
Hemoglobin: 8.8 g/dL — ABNORMAL LOW (ref 13.0–17.0)
Potassium: 3.9 mmol/L (ref 3.5–5.1)
SODIUM: 135 mmol/L (ref 135–145)
TCO2: 24 mmol/L (ref 22–32)

## 2017-08-09 MED ORDER — OXYCODONE HCL 5 MG PO TABS
5.0000 mg | ORAL_TABLET | Freq: Four times a day (QID) | ORAL | Status: DC | PRN
Start: 2017-08-09 — End: 2017-08-14
  Administered 2017-08-11: 5 mg via ORAL
  Filled 2017-08-09: qty 1

## 2017-08-09 MED ORDER — RESOURCE THICKENUP CLEAR PO POWD
ORAL | Status: DC | PRN
  Filled 2017-08-09 (×3): qty 125

## 2017-08-09 MED ORDER — FUROSEMIDE 10 MG/ML IJ SOLN
40.0000 mg | Freq: Two times a day (BID) | INTRAMUSCULAR | Status: DC
  Administered 2017-08-09 – 2017-08-12 (×8): 40 mg via INTRAVENOUS
  Filled 2017-08-09 (×8): qty 4

## 2017-08-09 MED ORDER — POTASSIUM CHLORIDE 10 MEQ/50ML IV SOLN
10.0000 meq | INTRAVENOUS | Status: AC
  Administered 2017-08-09 (×2): 10 meq via INTRAVENOUS
  Filled 2017-08-09 (×2): qty 50

## 2017-08-09 MED ORDER — DEXTROSE 5 % IV SOLN
1.0000 g | Freq: Two times a day (BID) | INTRAVENOUS | Status: DC
  Administered 2017-08-09 – 2017-08-10 (×3): 1 g via INTRAVENOUS
  Filled 2017-08-09 (×3): qty 1

## 2017-08-09 NOTE — Anesthesia Postprocedure Evaluation (Signed)
Anesthesia Post Note  Patient: Jalon Squier  Procedure(s) Performed: Procedure(s) (LRB): CORONARY ARTERY BYPASS GRAFTING (CABG) x 2 WITH ENDOSCOPIC HARVESTING OF RIGHT SAPHENOUS VEIN. (N/A) TRANSESOPHAGEAL ECHOCARDIOGRAM (TEE) (N/A)     Patient location during evaluation: ICU Anesthesia Type: General Level of consciousness: awake and alert Pain management: pain level controlled Vital Signs Assessment: post-procedure vital signs reviewed and stable Respiratory status: patient connected to nasal cannula oxygen Cardiovascular status: stable Postop Assessment: no apparent nausea or vomiting Anesthetic complications: no Comments: Patient extubated, was on BiPap initially, weaned off now. Stable, neuro intact.    Last Vitals:  Vitals:   08/09/17 1000 08/09/17 1030  BP: (!) 153/52 (!) 157/64  Pulse: 88 86  Resp: (!) 27 19  Temp:    SpO2: 98% 100%    Last Pain:  Vitals:   08/09/17 0800  TempSrc: Oral  PainSc:                  Audry Pili

## 2017-08-09 NOTE — Progress Notes (Signed)
2 Days Post-Op Procedure(s) (LRB): CORONARY ARTERY BYPASS GRAFTING (CABG) x 2 WITH ENDOSCOPIC HARVESTING OF RIGHT SAPHENOUS VEIN. (N/A) TRANSESOPHAGEAL ECHOCARDIOGRAM (TEE) (N/A) Subjective: Better this am- off BIPAP, NSR, OOB in chair Acute on chronic renal failure- creat 2.4 Neuro intact Objective: Vital signs in last 24 hours: Temp:  [97 F (36.1 C)-98.6 F (37 C)] 98.1 F (36.7 C) (09/16 0800) Pulse Rate:  [75-108] 75 (09/16 0800) Cardiac Rhythm: Normal sinus rhythm (09/16 0730) Resp:  [6-28] 15 (09/16 0800) BP: (76-170)/(39-88) 110/46 (09/16 0800) SpO2:  [91 %-100 %] 98 % (09/16 0856) FiO2 (%):  [40 %] 40 % (09/15 2230) Weight:  [191 lb 9.3 oz (86.9 kg)] 191 lb 9.3 oz (86.9 kg) (09/16 0630)  Hemodynamic parameters for last 24 hours:  stable  Intake/Output from previous day: 09/15 0701 - 09/16 0700 In: 1290.5 [P.O.:240; I.V.:750.5; IV Piggyback:300] Out: 405 [Urine:325; Chest Tube:80] Intake/Output this shift: Total I/O In: 30.7 [I.V.:30.7] Out: 30 [Urine:30]       Exam    General- alert and comfortable   Lungs- clear without rales, wheezes   Cor- regular rate and rhythm, no murmur , gallop   Abdomen- soft, non-tender   Extremities - warm, non-tender, minimal edema   Neuro- oriented, appropriate, no focal weakness   Lab Results:  Recent Labs  08/08/17 1656 08/09/17 0355  WBC 10.0 11.1*  HGB 8.3* 8.6*  HCT 24.5* 25.4*  PLT 100* 99*   BMET:  Recent Labs  08/08/17 1656 08/09/17 0355  NA 134* 136  K 4.0 3.6  CL 103 104  CO2 22 24  GLUCOSE 153* 116*  BUN 19 22*  CREATININE 2.21* 2.38*  CALCIUM 8.0* 8.3*    PT/INR:  Recent Labs  08/07/17 1350  LABPROT 17.8*  INR 1.48   ABG    Component Value Date/Time   PHART 7.538 (H) 08/08/2017 1938   HCO3 20.0 08/08/2017 1938   TCO2 21 (L) 08/08/2017 1938   ACIDBASEDEF 2.0 08/08/2017 1938   O2SAT 64.3 08/09/2017 0418   CBG (last 3)   Recent Labs  08/08/17 1230 08/08/17 1622 08/09/17 0813   GLUCAP 102* 148* 106*    Assessment/Plan: S/P Procedure(s) (LRB): CORONARY ARTERY BYPASS GRAFTING (CABG) x 2 WITH ENDOSCOPIC HARVESTING OF RIGHT SAPHENOUS VEIN. (N/A) TRANSESOPHAGEAL ECHOCARDIOGRAM (TEE) (N/A) Postop blood loss anemia Postop acute on chronic renal failure Hi risk for aspiration - hx of brainstem cva. SLT eval ITP- hold Eliquis for now  LOS: 9 days    Tharon Aquas Trigt III 08/09/2017

## 2017-08-09 NOTE — Evaluation (Signed)
Clinical/Bedside Swallow Evaluation Patient Details  Name: Stanley Robles MRN: [id number] Date of Birth: Aug 20, 1932  Today's Date: 08/09/2017 Time: SLP Start Time (ACUTE ONLY): 66 SLP Stop Time (ACUTE ONLY): 1425 SLP Time Calculation (min) (ACUTE ONLY): 15 min  Past Medical History:  Past Medical History:  Diagnosis Date  . BPH (benign prostatic hypertrophy)   . Cancer ([place])    CANCEROUS MOLE REMOVED FROM BACK - SEVERAL YRS AGO  . Chronic kidney disease stage 3   . Coronary artery disease    DR. TILLEY IS PT'S CARDIOLGIST  . GERD (gastroesophageal reflux disease)   . Gout   . Hyperlipidemia   . Hypertensive heart disease   . Incontinence of urine   . Memory loss   . Myocardial infarction ([place]) 1994  . Osteoarthritis   . Paroxysmal atrial fibrillation ([facility])   . Peripheral vascular disease ([place])   . Personal history of transient ischemic attack (TIA) and cerebral infarction without residual deficit   . Right pontine stroke ([place]) 02/27/2015  . Tooth disease    Past Surgical History:  Past Surgical History:  Procedure Laterality Date  . CARDIAC CATHETERIZATION  07/21/2005  . HERNIA REPAIR  12/26/2002   BILATERAL INGUINAL HERNIA REPAIR  . LEFT HEART CATH AND CORONARY ANGIOGRAPHY N/A 08/03/2017   Procedure: LEFT HEART CATH AND CORONARY ANGIOGRAPHY;  Surgeon: Sherren Mocha, MD;  Location: [facility];  Service: Cardiovascular;  Laterality: N/A;  . MASTOID SURGERY X 5    . PROSTATE SURGERY    . TOTAL KNEE ARTHROPLASTY Left 03/28/2013   Procedure: LEFT TOTAL KNEE ARTHROPLASTY;  Surgeon: Gearlean Alf, MD;  Location: [facility];  Service: Orthopedics;  Laterality: Left;   HPI:  Pt is an 83 yo male admitted with NSTEMI s/p cath which showed severe left main disease and total right coronary artery stenosis, potential CABG planned for 9/14. 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  Was evaluated by ST this admission prior to CABG; MBS 08/05/17 revealed mild oropharyngeal and cervical esophageal dysphagia, suspected to be more chronic in nature given his h/o dysphagia from prior CVA and h/o GERD. Dys 3, thin liquids, no straws recommended with ST to follow briefly through surgery given his history. Pt was intubated for CABG on 9/14, transported back to floor on vent and subsequent extubated at 10pm same day. Swallow evaluation ordered due to pt coughing with meal this morning.    Assessment / Plan / Recommendation Clinical Impression  Patient presents with what is likely an acute exacerbation of his mild chronic dysphagia s/p intubation for CABG. Initially tolerates sips of thin liquids via cup, however as trials progressed pt had explosive coughing with both cup and straw sips of thin liquids, suggestive of reduced airway protection. Coughing with solids, suspect reduced airway protection due to prolonged mastication and higher respiratory rate (fluctating mid-upper 20s). With nectar-thick liquids and soft solids, no overt signs of aspiration noted. With challenging via straw sip, pt did exhibit subtle throat clearing. Recommend conservative diet initially as pt recovers from surgery of dys 2, nectar thick liquids, with meds whole in puree. Continue to follow precautions from most recent MBS: no straws, small single sips, upright during and for 30 minutes after meals. Anticipate pt will be able to advance solids and liquids with additional time, improvments in respiratory and overall medical status. Will follow.  SLP Visit Diagnosis: Dysphagia, oropharyngeal phase (R13.12);Dysphagia, pharyngoesophageal  phase (R13.14)    Aspiration Risk  Moderate aspiration risk    Diet Recommendation Dysphagia 2 (Fine chop);Nectar-thick liquid   Liquid Administration via: Cup;No straw Medication Administration: Whole meds with puree Supervision: Patient able to self feed;Intermittent supervision  to cue for compensatory strategies Compensations: Slow rate;Small sips/bites;Follow solids with liquid Postural Changes: Seated upright at 90 degrees;Remain upright for at least 30 minutes after po intake    Other  Recommendations Oral Care Recommendations: Oral care BID Other Recommendations: Order thickener from pharmacy   Follow up Recommendations None      Frequency and Duration min 2x/week  2 weeks       Prognosis Prognosis for Safe Diet Advancement: Good      Swallow Study   General Date of Onset: 07/31/17 HPI: Pt is an 83 yo male admitted with NSTEMI s/p cath which showed severe left main disease and total right coronary artery stenosis, potential CABG planned for 9/14. 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 Was evaluated by ST this admission prior to CABG; MBS 08/05/17 revealed mild oropharyngeal and cervical esophageal dysphagia, suspected to be more chronic in nature given his h/o dysphagia from prior CVA and h/o GERD. Dys 3, thin liquids, no straws recommended with ST to follow briefly through surgery given his history. Pt was intubated for CABG on 9/14, transported back to floor on vent and subsequent extubated at 10pm same day. Swallow evaluation ordered due to pt coughing with meal this morning.  Type of Study: Bedside Swallow Evaluation Previous Swallow Assessment: see HPI Diet Prior to this Study: NPO Temperature Spikes Noted: No Respiratory Status: Nasal cannula History of Recent Intubation: Yes Length of Intubations (days):  (for procedure) Date extubated: 08/07/17 Behavior/Cognition: Alert;Cooperative;Pleasant mood;Other (Comment) (HOH) Oral Cavity Assessment: Within Functional Limits Oral Care Completed by SLP: Yes Oral Cavity - Dentition: Adequate natural dentition;Missing dentition Vision: Functional for self-feeding Self-Feeding Abilities: Able to feed  self Patient Positioning: Upright in bed Baseline Vocal Quality: Normal Volitional Cough: Strong Volitional Swallow: Able to elicit    Oral/Motor/Sensory Function Overall Oral Motor/Sensory Function: Within functional limits   Ice Chips Ice chips: Within functional limits Presentation: Spoon   Thin Liquid Thin Liquid: Impaired Presentation: Cup;Self Fed;Straw Pharyngeal  Phase Impairments: Cough - Immediate;Change in Vital Signs    Nectar Thick Nectar Thick Liquid: Impaired Presentation: Cup;Straw Pharyngeal Phase Impairments: Throat Clearing - Immediate;Other (comments) (throat clearing with straw)   Honey Thick Honey Thick Liquid: Not tested   Puree Puree: Within functional limits Presentation: Self Fed;Spoon   Solid   GO   Solid: Impaired Presentation: Self Fed Pharyngeal Phase Impairments: Cough - Immediate;Change in Vital Signs       Deneise Lever, [place], CCC-SLP Speech-Language Pathologist [phone]  Aliene Altes 08/09/2017,2:51 PM

## 2017-08-10 ENCOUNTER — Encounter (HOSPITAL_COMMUNITY): Payer: Self-pay | Admitting: Cardiothoracic Surgery

## 2017-08-10 ENCOUNTER — Inpatient Hospital Stay (HOSPITAL_COMMUNITY): Payer: Medicare Other

## 2017-08-10 LAB — GLUCOSE, CAPILLARY
GLUCOSE-CAPILLARY: 116 mg/dL — AB (ref 65–99)
GLUCOSE-CAPILLARY: 122 mg/dL — AB (ref 65–99)
GLUCOSE-CAPILLARY: 134 mg/dL — AB (ref 65–99)
GLUCOSE-CAPILLARY: 191 mg/dL — AB (ref 65–99)
GLUCOSE-CAPILLARY: 87 mg/dL (ref 65–99)
GLUCOSE-CAPILLARY: 93 mg/dL (ref 65–99)
GLUCOSE-CAPILLARY: 95 mg/dL (ref 65–99)
Glucose-Capillary: 97 mg/dL (ref 65–99)

## 2017-08-10 LAB — BASIC METABOLIC PANEL
Anion gap: 10 (ref 5–15)
Anion gap: 9 (ref 5–15)
BUN: 30 mg/dL — ABNORMAL HIGH (ref 6–20)
BUN: 33 mg/dL — ABNORMAL HIGH (ref 6–20)
CO2: 23 mmol/L (ref 22–32)
CO2: 24 mmol/L (ref 22–32)
Calcium: 8.2 mg/dL — ABNORMAL LOW (ref 8.9–10.3)
Calcium: 8.2 mg/dL — ABNORMAL LOW (ref 8.9–10.3)
Chloride: 100 mmol/L — ABNORMAL LOW (ref 101–111)
Chloride: 98 mmol/L — ABNORMAL LOW (ref 101–111)
Creatinine, Ser: 2.95 mg/dL — ABNORMAL HIGH (ref 0.61–1.24)
Creatinine, Ser: 3.09 mg/dL — ABNORMAL HIGH (ref 0.61–1.24)
GFR calc Af Amer: 21 mL/min — ABNORMAL LOW (ref >60)
GFR calc Af Amer: 20 mL/min — ABNORMAL LOW (ref >60)
GFR calc non Af Amer: 18 mL/min — ABNORMAL LOW (ref >60)
GFR calc non Af Amer: 17 mL/min — ABNORMAL LOW (ref >60)
Glucose, Bld: 122 mg/dL — ABNORMAL HIGH (ref 65–99)
Glucose, Bld: 140 mg/dL — ABNORMAL HIGH (ref 65–99)
Potassium: 3.5 mmol/L (ref 3.5–5.1)
Potassium: 3.7 mmol/L (ref 3.5–5.1)
Sodium: 133 mmol/L — ABNORMAL LOW (ref 135–145)
Sodium: 131 mmol/L — ABNORMAL LOW (ref 135–145)

## 2017-08-10 LAB — CBC WITH DIFFERENTIAL/PLATELET
Basophils Absolute: 0 10*3/uL (ref 0.0–0.1)
Basophils Absolute: 0 10*3/uL (ref 0.0–0.1)
Basophils Relative: 0 %
Basophils Relative: 0 %
Eosinophils Absolute: 0 10*3/uL (ref 0.0–0.7)
Eosinophils Absolute: 0 10*3/uL (ref 0.0–0.7)
Eosinophils Relative: 0 %
Eosinophils Relative: 0 %
HCT: 25.9 % — ABNORMAL LOW (ref 39.0–52.0)
HCT: 27.5 % — ABNORMAL LOW (ref 39.0–52.0)
Hemoglobin: 9 g/dL — ABNORMAL LOW (ref 13.0–17.0)
Hemoglobin: 9.3 g/dL — ABNORMAL LOW (ref 13.0–17.0)
Lymphocytes Relative: 6 %
Lymphocytes Relative: 7 %
Lymphs Abs: 0.8 10*3/uL (ref 0.7–4.0)
Lymphs Abs: 1.1 10*3/uL (ref 0.7–4.0)
MCH: 34.4 pg — ABNORMAL HIGH (ref 26.0–34.0)
MCH: 33.6 pg (ref 26.0–34.0)
MCHC: 34.7 g/dL (ref 30.0–36.0)
MCHC: 33.8 g/dL (ref 30.0–36.0)
MCV: 98.9 fL (ref 78.0–100.0)
MCV: 99.3 fL (ref 78.0–100.0)
Monocytes Absolute: 0.8 10*3/uL (ref 0.1–1.0)
Monocytes Absolute: 1 10*3/uL (ref 0.1–1.0)
Monocytes Relative: 6 %
Monocytes Relative: 6 %
Neutro Abs: 11.9 10*3/uL — ABNORMAL HIGH (ref 1.7–7.7)
Neutro Abs: 13.4 10*3/uL — ABNORMAL HIGH (ref 1.7–7.7)
Neutrophils Relative %: 88 %
Neutrophils Relative %: 87 %
Platelets: 120 10*3/uL — ABNORMAL LOW (ref 150–400)
Platelets: 134 10*3/uL — ABNORMAL LOW (ref 150–400)
RBC: 2.62 MIL/uL — ABNORMAL LOW (ref 4.22–5.81)
RBC: 2.77 MIL/uL — ABNORMAL LOW (ref 4.22–5.81)
RDW: 14.9 % (ref 11.5–15.5)
RDW: 14.7 % (ref 11.5–15.5)
WBC: 13.6 10*3/uL — ABNORMAL HIGH (ref 4.0–10.5)
WBC: 15.5 10*3/uL — ABNORMAL HIGH (ref 4.0–10.5)

## 2017-08-10 LAB — TYPE AND SCREEN
ABO/RH(D): O POS
Antibody Screen: NEGATIVE
Unit division: 0
Unit division: 0
Unit division: 0
Unit division: 0

## 2017-08-10 LAB — COOXEMETRY PANEL
Carboxyhemoglobin: 0.9 % (ref 0.5–1.5)
Methemoglobin: 1.2 % (ref 0.0–1.5)
O2 Saturation: 61.4 %
Total hemoglobin: 9 g/dL — ABNORMAL LOW (ref 12.0–16.0)

## 2017-08-10 LAB — SEROTONIN RELEASE ASSAY (SRA)
SRA .2 IU/mL UFH Ser-aCnc: <1 % (ref 0–20)
SRA 100IU/mL UFH Ser-aCnc: 1 % (ref 0–20)

## 2017-08-10 LAB — CBC
HCT: 26 % — ABNORMAL LOW (ref 39.0–52.0)
Hemoglobin: 8.9 g/dL — ABNORMAL LOW (ref 13.0–17.0)
MCH: 33.8 pg (ref 26.0–34.0)
MCHC: 34.2 g/dL (ref 30.0–36.0)
MCV: 98.9 fL (ref 78.0–100.0)
Platelets: 122 10*3/uL — ABNORMAL LOW (ref 150–400)
RBC: 2.63 MIL/uL — ABNORMAL LOW (ref 4.22–5.81)
RDW: 14.6 % (ref 11.5–15.5)
WBC: 14.9 10*3/uL — ABNORMAL HIGH (ref 4.0–10.5)

## 2017-08-10 LAB — BPAM RBC
Blood Product Expiration Date: 201810102359
Blood Product Expiration Date: 201810112359
Blood Product Expiration Date: 201810122359
Blood Product Expiration Date: 201810122359
ISSUE DATE / TIME: 201809151518
ISSUE DATE / TIME: 201809151518
Unit Type and Rh: 5100
Unit Type and Rh: 5100
Unit Type and Rh: 5100
Unit Type and Rh: 5100

## 2017-08-10 LAB — POCT I-STAT, CHEM 8
BUN: 30 mg/dL — ABNORMAL HIGH (ref 6–20)
CALCIUM ION: 1.16 mmol/L (ref 1.15–1.40)
Chloride: 96 mmol/L — ABNORMAL LOW (ref 101–111)
Creatinine, Ser: 3 mg/dL — ABNORMAL HIGH (ref 0.61–1.24)
Glucose, Bld: 133 mg/dL — ABNORMAL HIGH (ref 65–99)
HCT: 27 % — ABNORMAL LOW (ref 39.0–52.0)
HEMOGLOBIN: 9.2 g/dL — AB (ref 13.0–17.0)
Potassium: 3.7 mmol/L (ref 3.5–5.1)
SODIUM: 133 mmol/L — AB (ref 135–145)
TCO2: 21 mmol/L — AB (ref 22–32)

## 2017-08-10 MED ORDER — ALLOPURINOL 100 MG PO TABS
200.0000 mg | ORAL_TABLET | Freq: Every day | ORAL | Status: DC
  Administered 2017-08-11 – 2017-08-28 (×13): 200 mg via ORAL
  Filled 2017-08-10 (×14): qty 2

## 2017-08-10 MED ORDER — AMIODARONE IV BOLUS ONLY 150 MG/100ML
150.0000 mg | Freq: Once | INTRAVENOUS | Status: AC
  Administered 2017-08-10: 150 mg via INTRAVENOUS

## 2017-08-10 MED ORDER — SODIUM CHLORIDE 0.9% FLUSH: 10.0000 mL | INTRAVENOUS | Status: DC | PRN

## 2017-08-10 MED ORDER — METOCLOPRAMIDE HCL 5 MG/ML IJ SOLN
5.0000 mg | Freq: Four times a day (QID) | INTRAMUSCULAR | Status: DC
  Administered 2017-08-10 – 2017-08-11 (×5): 5 mg via INTRAVENOUS
  Filled 2017-08-10 (×5): qty 2

## 2017-08-10 MED ORDER — AMIODARONE HCL 200 MG PO TABS: 200.0000 mg | ORAL_TABLET | Freq: Two times a day (BID) | ORAL | Status: DC

## 2017-08-10 MED ORDER — ROSUVASTATIN CALCIUM 10 MG PO TABS
10.0000 mg | ORAL_TABLET | Freq: Every day | ORAL | Status: DC
  Administered 2017-08-10 – 2017-08-27 (×11): 10 mg via ORAL
  Filled 2017-08-10 (×11): qty 1

## 2017-08-10 MED ORDER — POTASSIUM CHLORIDE 10 MEQ/50ML IV SOLN
10.0000 meq | INTRAVENOUS | Status: AC
  Administered 2017-08-10 (×2): 10 meq via INTRAVENOUS
  Filled 2017-08-10: qty 50

## 2017-08-10 MED ORDER — SODIUM CHLORIDE 0.9% FLUSH
10.0000 mL | Freq: Two times a day (BID) | INTRAVENOUS | Status: DC
  Administered 2017-08-10 – 2017-08-22 (×22): 10 mL
  Administered 2017-08-22 – 2017-08-23 (×2): 40 mL
  Administered 2017-08-23 – 2017-08-26 (×5): 10 mL

## 2017-08-10 MED ORDER — DEXTROSE 5 % IV SOLN
1.0000 g | INTRAVENOUS | Status: DC
  Administered 2017-08-11: 1 g via INTRAVENOUS
  Filled 2017-08-10: qty 1

## 2017-08-10 MED ORDER — POTASSIUM CHLORIDE 10 MEQ/50ML IV SOLN
INTRAVENOUS | Status: AC
  Filled 2017-08-10: qty 50

## 2017-08-10 MED ORDER — CHLORHEXIDINE GLUCONATE CLOTH 2 % EX PADS
6.0000 | MEDICATED_PAD | Freq: Every day | CUTANEOUS | Status: DC
  Administered 2017-08-11 – 2017-08-15 (×5): 6 via TOPICAL

## 2017-08-10 NOTE — Progress Notes (Signed)
Physical Therapy Treatment Patient Details Name: Stanley Robles MRN: [id number] DOB: 1932-05-27 Today's Date: 08/10/2017    History of Present Illness 83 yo admitted with NSTEMI s/p cath which showed severe left main disease and total right coronary artery stenosis, s/p CABG 08/08/17. PMhx:  CVA, sensorineural hearing loss, HLD, PVD, pAF, CKD stage 3, HTN, NSTEMI, chronic diastolic heart failure    PT Comments    Pt's mobility has been impacted by his CABG surgery 08/08/17 and he is currently modA for bed mobility, minA for transfers and modA for ambulation of 70 feet with EVA walker. Pt educated on sternal precautions and able to communicate his understanding as he is not able to "help with his arms" when he is trying to get out of bed or stand up. Pt would benefit from SNF placement at d/c to improve his strength and endurance for ultimate discharge home.  PT will continue to follow acutely to improve mobility, strength and endurance.    Follow Up Recommendations  SNF     Equipment Recommendations  Other (comment) (to be determined at next venue)       Precautions / Restrictions Precautions Precautions: Fall;Sternal Restrictions Weight Bearing Restrictions: Yes (sternal)    Mobility  Bed Mobility Overal bed mobility: Needs Assistance Bed Mobility: Supine to Sit     Supine to sit: Mod assist     General bed mobility comments: modA for trunk to upright to maintian sternal precautions  Transfers Overall transfer level: Needs assistance Equipment used:  [place] walker) Transfers: Sit to/from Stand Sit to Stand: Min assist         General transfer comment: minA for power up and to reach up to [employer] walker, vc for trunk to upright  Ambulation/Gait Ambulation/Gait assistance: Mod assist;+2 safety/equipment Ambulation Distance (Feet): 70 Feet Assistive device:  (Eva walker) Gait Pattern/deviations: Step-through pattern;Trunk flexed;Shuffle;Decreased step length - right;Decreased  step length - left Gait velocity: slowed Gait velocity interpretation: Below normal speed for age/gender General Gait Details: modA for steadying with EVA walker, unable to correct posture despite cues for head up   Stairs            Wheelchair Mobility    Modified Rankin (Stroke Patients Only)       Balance Overall balance assessment: Needs assistance Sitting-balance support: Feet supported Sitting balance-Leahy Scale: Fair     Standing balance support: Bilateral upper extremity supported Standing balance-Leahy Scale: Poor Standing balance comment: requires minA                            Cognition Arousal/Alertness: Awake/alert Behavior During Therapy: WFL for tasks assessed/performed Overall Cognitive Status: Within Functional Limits for tasks assessed                                           General Comments General comments (skin integrity, edema, etc.): prior to ambulation BP 125/79, SaO2 on 2 L via nasal cannula 98%, HR 78bpm, with ambulation max HR 123bpm, SaO2 95%O2      Pertinent Vitals/Pain Pain Assessment: 0-10 Pain Score: 4  Pain Location: sternum Pain Descriptors / Indicators: Constant;Aching Pain Intervention(s): Monitored during session;Limited activity within patient's tolerance           PT Goals (current goals can now be found in the care plan section) Acute Rehab PT Goals PT Goal Formulation:  With patient Time For Goal Achievement: 08/18/17 Potential to Achieve Goals: Fair Progress towards PT goals: Goals downgraded-see care plan (secondary to CABG surgery )    Frequency    Min 3X/week      PT Plan Discharge plan needs to be updated       AM-PAC PT "6 Clicks" Daily Activity  Outcome Measure  Difficulty turning over in bed (including adjusting bedclothes, sheets and blankets)?: Unable Difficulty moving from lying on back to sitting on the side of the bed? : Unable Difficulty sitting down on and  standing up from a chair with arms (e.g., wheelchair, bedside commode, etc,.)?: Unable Help needed moving to and from a bed to chair (including a wheelchair)?: A Little Help needed walking in hospital room?: A Lot Help needed climbing 3-5 steps with a railing? : Total 6 Click Score: 9    End of Session Equipment Utilized During Treatment: Gait belt Activity Tolerance: Patient tolerated treatment well Patient left: in chair;with call bell/phone within reach Nurse Communication: Mobility status PT Visit Diagnosis: Difficulty in walking, not elsewhere classified (R26.2);Muscle weakness (generalized) (M62.81)     Time: 1500-1530 PT Time Calculation (min) (ACUTE ONLY): 30 min  Charges:  $Gait Training: 8-22 mins $Therapeutic Activity: 8-22 mins                    G Codes:       Kiaya Haliburton B. Migdalia Dk PT, DPT Acute Rehabilitation  [phone] Pager [phone]     [place] 08/10/2017, 6:45 PM

## 2017-08-10 NOTE — Progress Notes (Signed)
Patient ID: Stanley Robles, male   DOB: 05-Jul-1932, 83 y.o.   MRN: [id number] EVENING ROUNDS NOTE :     [address]       [place] [address]             [phone]                 3 Days Post-Op Procedure(s) (LRB): CORONARY ARTERY BYPASS GRAFTING (CABG) x 2 WITH ENDOSCOPIC HARVESTING OF RIGHT SAPHENOUS VEIN. (N/A) TRANSESOPHAGEAL ECHOCARDIOGRAM (TEE) (N/A)  Total Length of Stay:  LOS: 10 days  BP (!) 104/54   Pulse (!) 112   Temp 98.3 F (36.8 C) (Oral)   Resp (!) 29   Ht 5\' 7"  (1.702 m)   Wt 190 lb 14.7 oz (86.6 kg)   SpO2 100%   BMI 29.90 kg/m   .Intake/Output      09/16 0701 - 09/17 0700 09/17 0701 - 09/18 0700   P.O. 360    I.V. (mL/kg) 1080.8 (12.5) 278 (3.2)   IV Piggyback 150 50   Total Intake(mL/kg) 1590.8 (18.4) 328 (3.8)   Urine (mL/kg/hr) 890 (0.4) 495 (0.5)   Chest Tube 20    Total Output 910 495   Net +680.8 -167          . sodium chloride Stopped (08/08/17 0900)  . sodium chloride    . sodium chloride 10 mL/hr at 08/10/17 0800  . amiodarone 30 mg/hr (08/10/17 1700)  . [START ON 08/11/2017] cefTAZidime (FORTAZ)  IV    . DOPamine 2 mcg/kg/min (08/10/17 1700)  . lactated ringers 10 mL/hr at 08/10/17 0800  . milrinone 0.25 mcg/kg/min (08/10/17 1700)     Lab Results  Component Value Date   WBC 15.5 (H) 08/10/2017   HGB 9.2 (L) 08/10/2017   HCT 27.0 (L) 08/10/2017   PLT 134 (L) 08/10/2017   GLUCOSE 133 (H) 08/10/2017   CHOL 162 02/28/2015   TRIG 145 02/28/2015   HDL 31 (L) 02/28/2015   LDLCALC 102 (H) 02/28/2015   ALT 17 08/04/2017   AST 24 08/04/2017   NA 133 (L) 08/10/2017   K 3.7 08/10/2017   CL 96 (L) 08/10/2017   CREATININE 3.00 (H) 08/10/2017   BUN 30 (H) 08/10/2017   CO2 24 08/10/2017   TSH 3.035 08/04/2017   INR 1.48 08/07/2017   HGBA1C 5.3 08/04/2017   Cr 3.0 Bun 30  495 uop past 12 hours  In afib on IV Cordarone- rate Soper MD  Beeper [phone] Office [phone] 08/10/2017 6:50 PM

## 2017-08-10 NOTE — Progress Notes (Signed)
Peripherally Inserted Central Catheter/Midline Placement  The IV Nurse has discussed with the patient and/or persons authorized to consent for the patient, the purpose of this procedure and the potential benefits and risks involved with this procedure.  The benefits include less needle sticks, lab draws from the catheter, and the patient may be discharged home with the catheter. Risks include, but not limited to, infection, bleeding, blood clot (thrombus formation), and puncture of an artery; nerve damage and irregular heartbeat and possibility to perform a PICC exchange if needed/ordered by physician.  Alternatives to this procedure were also discussed.  Bard Power PICC patient education guide, fact sheet on infection prevention and patient information card has been provided to patient /or left at bedside.  Consent obtained from daughter due to altered mental status.  PICC/Midline Placement Documentation        Stanley Robles, Stanley Robles 08/10/2017, 1:05 PM

## 2017-08-10 NOTE — Progress Notes (Signed)
3 Days Post-Op Procedure(s) (LRB): CORONARY ARTERY BYPASS GRAFTING (CABG) x 2 WITH ENDOSCOPIC HARVESTING OF RIGHT SAPHENOUS VEIN. 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 CVA- on D2 diet thick liqs  Objective: Vital signs in last 24 hours: Temp:  [98 F (36.7 C)-99 F (37.2 C)] 98.2 F (36.8 C) (09/17 1205) Pulse Rate:  [74-115] 104 (09/17 1100) Cardiac Rhythm: Atrial fibrillation (09/17 0800) Resp:  [15-33] 22 (09/17 1100) BP: (96-171)/(45-150) 114/58 (09/17 1100) SpO2:  [91 %-100 %] 100 % (09/17 1310) FiO2 (%):  [28 %] 28 % (09/17 0930) Weight:  [190 lb 14.7 oz (86.6 kg)] 190 lb 14.7 oz (86.6 kg) (09/17 0600)  Hemodynamic parameters for last 24 hours:  afebrile  Intake/Output from previous day: 09/16 0701 - 09/17 0700 In: 1565 [P.O.:360; I.V.:1055; IV Piggyback:150] Out: 910 [Urine:890; Chest Tube:20] Intake/Output this shift: Total I/O In: 164.1 [I.V.:114.1; IV Piggyback:50] Out: 45 [Urine:45]       Exam    General- alert and comfortable   Lungs- clear without rales, wheezes   Cor- regular rate and rhythm, no murmur , gallop   Abdomen- soft, non-tender   Extremities - warm, non-tender, minimal edema   Neuro- oriented, appropriate, no focal weakness   Lab Results:  Recent Labs  08/10/17 0329 08/10/17 0846  WBC 14.9* 13.6*  HGB 8.9* 9.0*  HCT 26.0* 25.9*  PLT 122* 120*   BMET:  Recent Labs  08/09/17 0355 08/09/17 1533 08/10/17 0846  NA 136 135 133*  K 3.6 3.9 3.5  CL 104 98* 100*  CO2 24  --  23  GLUCOSE 116* 116* 122*  BUN 22* 24* 30*  CREATININE 2.38* 2.50* 2.95*  CALCIUM 8.3*  --  8.2*    PT/INR: No results for input(s): LABPROT, INR in the last 72 hours. ABG    Component Value Date/Time   PHART 7.538 (H) 08/08/2017 1938   HCO3 20.0  08/08/2017 1938   TCO2 24 08/09/2017 1533   ACIDBASEDEF 2.0 08/08/2017 1938   O2SAT 61.4 08/10/2017 0400   CBG (last 3)   Recent Labs  08/10/17 0336 08/10/17 0859 08/10/17 1204  GLUCAP 95 97 122*    Assessment/Plan: S/P Procedure(s) (LRB): CORONARY ARTERY BYPASS GRAFTING (CABG) x 2 WITH ENDOSCOPIC HARVESTING OF RIGHT SAPHENOUS VEIN. (N/A) TRANSESOPHAGEAL ECHOCARDIOGRAM (TEE) (N/A) Mobilize Diuresis Diabetes control lovenox for afib, transition to eliquis at discharge   LOS: 10 days    Tharon Aquas Trigt III 08/10/2017

## 2017-08-10 NOTE — Op Note (Signed)
Stanley Robles, Stanley Robles NO.:  [id number]  MEDICAL RECORD NO.:  [id number]  LOCATION:                                 FACILITY:  PHYSICIAN:  Ivin Poot, M.D.  DATE OF BIRTH:  04-13-1932  DATE OF PROCEDURE:  08/07/2017 DATE OF DISCHARGE:                              OPERATIVE REPORT   OPERATIONS: 1. Coronary artery bypass grafting x2 (left internal mammary artery to     left anterior descending, saphenous vein graft to circumflex     marginal). 2. Endoscopic harvest of right leg greater saphenous vein. 3. Right axillary artery cannulation for cardiopulmonary bypass.  SURGEON:  Ivin Poot, MD.  ASSISTANT:  Nicholes Rough, PA-C.  ANESTHESIA:  General.  PREOPERATIVE DIAGNOSES: 1. Unstable angina. 2. Non-ST myocardial infarction. 3. Severe left main stenosis with chronic occlusion of the right     coronary artery. 4. History of chronic renal failure. 5. Thrombocytopenia. 6. Atrial fibrillation.  POSTOPERATIVE DIAGNOSES: 1. Unstable angina. 2. Non-ST myocardial infarction. 3. 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 History of chronic renal failure. 5. Thrombocytopenia. 6. Atrial fibrillation.  CLINICAL NOTE:  The patient is an 83 year old fragile, chronically ill male, who presented with chest pain, positive cardiac enzymes, and was found to have severe coronary disease.  LV function was mildly reduced without significant valvular disease (mild-moderate mitral regurgitation).  However, his coronaries had severe disease with high- grade left main stenosis, ostial circumflex stenosis, and chronic occlusion of a small atretic right coronary artery.  He was not felt to be candidate for percutaneous therapy by Interventional Cardiology. Cardiothoracic surgical evaluation was requested for CABG, which was felt to be his only realistic  long-term therapy.  I evaluated the patient and discussed the situation with the patient and his family. They understood that with his age, his comorbid problems with renal failure, thrombocytopenia, atrial fibrillation, and previous brainstem stroke that this would be a high risk operation, but as best therapy for his severe coronary disease.  I discussed the potential risks to him including the risk of stroke, bleeding, blood transfusion requirement, postoperative infection, postoperative pulmonary problems including pleural effusion, postoperative organ failure, and death.  Both he and his family demonstrated their understanding and agreed to proceed with surgery under what I felt was an informed consent.  Prior to surgery, a Hematology consultation was completed by Dr. Beryle Beams, who felt the patient probably had chronic ITP (idiopathic thrombocytopenic purpura). He recommended transfusion of platelets as needed for surgical bleeding.  DESCRIPTION OF PROCEDURE:  The patient was brought from the preoperative holding area to the operating room and placed supine on the operating room table.  General anesthesia was obtained, and a transesophageal echo probe was placed.  This showed fairly well preserved biventricular function with mild-moderate MR.  The patient was prepped and draped as a sterile field.  A proper time-out was performed.  A sternal incision was made as the saphenous vein was harvested endoscopically from the right leg.  The left internal mammary artery was harvested as  a pedicle graft from its origin at the subclavian vessels.  The pericardium was opened and the heart was inspected.  The aorta was inspected and examined and palpated.  It was totally calcified from the sinotubular junction to the aortic arch.  The condition of the aorta precluded any cannulation, proximal coronary anastomoses, or placing a cross-clamp.  I then made an incision beneath the right clavicle,  exposed the right axillary artery after carefully dissecting it from the brachial plexus, and with vascular clamps proximally and distally, performed an end-to- side anastomosis with a combined graft cannula with running 5-0 Prolene. The vascular clamps were removed and the cannula was gently placed to the side above the sternal incision.  A sternal retractor was then placed.  A pursestring was placed in the right atrium for venous cannulation.  It was decided not to perform a cardioplegic arrest and crossclamped the aorta due to the condition of the aorta.  It was decided to do the coronary bypass grafts, distal anastomoses on bypass, but with a beating heart.  A left ventricular vent was placed via the right superior pulmonary vein after the patient was placed on cardiopulmonary bypass.  The coronaries were identified for grafting on bypass.  The vein was prepared for the distal anastomosis.  The patient was then placed in a mild Trendelenburg and the heart was positioned while beating for the first distal anastomosis.  This was to the OM1 branch of the left coronary.  Proximal and distal figure-of- eight sutures were placed around the coronary for hemostasis.  The heart was held and then fibrillator pads were placed to the heart to help the exposure of the coronary.  After the heart was fibrillated, aortotomy was performed.  The artery was 1.5 mm.  A reverse saphenous vein was then sewn end-to-side with running 7-0 Prolene.  There was good flow through the graft after it was tested with the hand-held irrigation syringe after removal of the proximal and distal figure-of-eight sutures around the circumflex vessel.  The heart was then placed back down in the pericardium and was converted back to a beating heart.  The mammary artery was then prepared for the distal anastomosis and brought through an opening in the pericardium.  The heart was then elevated on lap pads and held and was  fibrillated with the fibrillator pads.  LAD arteriotomy was performed and proximal distal vessel occlusion was achieved.  The mammary artery was then sewn end-to-side with running 8-0 Prolene to the LAD.  The bulldog on the mammary artery was temporarily released after the anastomosis was completed, and there was good flow through the anastomosis.  Hemostasis was achieved with an additional interrupted 8-0 Prolene near the heel of the anastomosis. The pedicle was secured to the epicardium with 6-0 Prolene.  The heart was placed back down into the pericardium in the anatomic position and the patient was rewarmed.  Temporary pacing wires were applied.  The lungs were expanded.  The ventilator was resumed.  The vein bypass graft could not be sewn to the aorta for proximal anastomosis.  It was decided to bring the vein bypass graft through the mediastinum over the aorta through the right side of the pericardium, over the apex of the right lung, and then tunneled transthoracic through the chest wall into the pocket of the axillary artery exposure for anastomosis to the axillary artery graft.  This would be completed after the patient was off cardiopulmonary bypass.  The patient was then prepared to  separate from cardiopulmonary bypass with a patent mammary graft to his LAD.  There was good ventricular function.  He was started on low-dose milrinone.  He was transitioned off cardiopulmonary bypass without difficulty.  Echo showed preserved LV systolic function.  Before any protamine was administered, the axillary artery graft was clamped proximally and distally and a small opening was made with the eye cautery and a 4.0-mm punch.  The end of the vein was then sewn end-to-side with running 6-0 Prolene in a running anastomosis. The axillary graft was then filled after the small bulldog clamps were removed and there was good flow through the vein graft.  There was hemostasis at the mammary  artery anastomosis, at the distal vein graft anastomosis, and at the proximal vein graft anastomosis in the right axillary artery graft.  The graft was stapled and divided just distal to the takeoff of the vein graft and oriented, so that the vein graft was not kinked.  The patient was then given protamine.  There was some diffuse coagulopathy related to thrombocytopenia.  The patient received an extra transfusion of platelets as well as FFP with improved coagulation function.  The patient remained hemodynamically stable.  The superior pericardium was closed.  Anterior mediastinal and a left pleural chest tube were placed and brought out through separate incisions.  The sternum was then closed with a wire.  The pectoralis fascia was closed with a running 0 Vicryl.  The subcutaneous and skin layers were closed with running Vicryl.  The right axillary incision was closed with interrupted Vicryl for the pectoralis muscle, running 2-0 Vicryl for the subcutaneous, and skin staples for the skin.  The patient was then returned to the ICU in critical, but stable condition.  Total cardiopulmonary bypass time was 90 minutes.  There was no cross-clamp time.     Ivin Poot, M.D.   ______________________________ Ivin Poot, M.D.    PV/MEDQ  D:  08/08/2017  T:  08/08/2017  Job:  [id number]  cc:   Ricard Dillon, M.D. Dr. Tollie Eth

## 2017-08-10 NOTE — Progress Notes (Signed)
  Speech Language Pathology Treatment: Dysphagia  Patient Details Name: Stanley Robles MRN: [id number] DOB: Oct 01, 1932 Today's Date: 08/10/2017 Time: 4097-3532 SLP Time Calculation (min) (ACUTE ONLY): 13 min  Assessment / Plan / Recommendation Clinical Impression  Pt sitting up in chair. Slightly increased work of breathing at rest prior to po's. Exhibited delayed coughing following nectar thick juice (fairly consistent). Respiratory rate stable; slightly increased dyspnea. Observed with graham cracker pieces in chocolate pudding. Informed RN if pt exhibits increased coughing with po's to continue meds only and not give meal trays. ST will follow up tomorrow.     HPI HPI: Pt is an 83 yo male admitted with NSTEMI s/p cath which showed severe left main disease and total right coronary artery stenosis, potential CABG planned for 9/14. 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 Was evaluated by ST this admission prior to CABG; MBS 08/05/17 revealed mild oropharyngeal and cervical esophageal dysphagia, suspected to be more chronic in nature given his h/o dysphagia from prior CVA and h/o GERD. Dys 3, thin liquids, no straws recommended with ST to follow briefly through surgery given his history. Pt was intubated for CABG on 9/14, transported back to floor on vent and subsequent extubated at 10pm same day. Swallow evaluation ordered due to pt coughing with meal this morning.       SLP Plan  Continue with current plan of care       Recommendations  Diet recommendations: Dysphagia 2 (fine chop);Nectar-thick liquid Liquids provided via: Cup;No straw Medication Administration: Whole meds with puree Supervision: Patient able to self feed;Full supervision/cueing for compensatory strategies Compensations: Slow rate;Small sips/bites Postural Changes and/or Swallow Maneuvers: Seated upright 90 degrees                Oral Care Recommendations: Oral care BID Follow up Recommendations:  (TBD) SLP Visit Diagnosis: Dysphagia, oropharyngeal phase (R13.12);Dysphagia, pharyngoesophageal phase (R13.14) Plan: Continue with current plan of care       GO                Houston Siren 08/10/2017, 11:58 AM   [name] [name] [phone]

## 2017-08-11 ENCOUNTER — Inpatient Hospital Stay (HOSPITAL_COMMUNITY): Payer: Medicare Other

## 2017-08-11 LAB — POCT I-STAT, CHEM 8
BUN: 36 mg/dL — AB (ref 6–20)
CALCIUM ION: 1.15 mmol/L (ref 1.15–1.40)
CHLORIDE: 92 mmol/L — AB (ref 101–111)
Creatinine, Ser: 3.2 mg/dL — ABNORMAL HIGH (ref 0.61–1.24)
GLUCOSE: 251 mg/dL — AB (ref 65–99)
HCT: 28 % — ABNORMAL LOW (ref 39.0–52.0)
Hemoglobin: 9.5 g/dL — ABNORMAL LOW (ref 13.0–17.0)
Potassium: 3.7 mmol/L (ref 3.5–5.1)
Sodium: 132 mmol/L — ABNORMAL LOW (ref 135–145)
TCO2: 22 mmol/L (ref 22–32)

## 2017-08-11 LAB — CBC
HCT: 25.4 % — ABNORMAL LOW (ref 39.0–52.0)
Hemoglobin: 8.8 g/dL — ABNORMAL LOW (ref 13.0–17.0)
MCH: 33.8 pg (ref 26.0–34.0)
MCHC: 34.6 g/dL (ref 30.0–36.0)
MCV: 97.7 fL (ref 78.0–100.0)
Platelets: 123 10*3/uL — ABNORMAL LOW (ref 150–400)
RBC: 2.6 MIL/uL — ABNORMAL LOW (ref 4.22–5.81)
RDW: 14.6 % (ref 11.5–15.5)
WBC: 12.1 10*3/uL — ABNORMAL HIGH (ref 4.0–10.5)

## 2017-08-11 LAB — POCT I-STAT 3, ART BLOOD GAS (G3+)
ACID-BASE DEFICIT: 2 mmol/L (ref 0.0–2.0)
BICARBONATE: 19.1 mmol/L — AB (ref 20.0–28.0)
O2 Saturation: 94 %
PH ART: 7.555 — AB (ref 7.350–7.450)
TCO2: 20 mmol/L — ABNORMAL LOW (ref 22–32)
pCO2 arterial: 21.4 mmHg — ABNORMAL LOW (ref 32.0–48.0)
pO2, Arterial: 58 mmHg — ABNORMAL LOW (ref 83.0–108.0)

## 2017-08-11 LAB — HEPATIC FUNCTION PANEL
ALT: 32 U/L (ref 17–63)
AST: 37 U/L (ref 15–41)
Albumin: 2.4 g/dL — ABNORMAL LOW (ref 3.5–5.0)
Alkaline Phosphatase: 59 U/L (ref 38–126)
Bilirubin, Direct: 0.3 mg/dL (ref 0.1–0.5)
Indirect Bilirubin: 0.6 mg/dL (ref 0.3–0.9)
Total Bilirubin: 0.9 mg/dL (ref 0.3–1.2)
Total Protein: 4.8 g/dL — ABNORMAL LOW (ref 6.5–8.1)

## 2017-08-11 LAB — BASIC METABOLIC PANEL
Anion gap: 9 (ref 5–15)
BUN: 37 mg/dL — ABNORMAL HIGH (ref 6–20)
CO2: 23 mmol/L (ref 22–32)
Calcium: 8.2 mg/dL — ABNORMAL LOW (ref 8.9–10.3)
Chloride: 101 mmol/L (ref 101–111)
Creatinine, Ser: 3.18 mg/dL — ABNORMAL HIGH (ref 0.61–1.24)
GFR calc Af Amer: 19 mL/min — ABNORMAL LOW (ref >60)
GFR calc non Af Amer: 17 mL/min — ABNORMAL LOW (ref >60)
Glucose, Bld: 99 mg/dL (ref 65–99)
Potassium: 3.2 mmol/L — ABNORMAL LOW (ref 3.5–5.1)
Sodium: 133 mmol/L — ABNORMAL LOW (ref 135–145)

## 2017-08-11 LAB — GLUCOSE, CAPILLARY
GLUCOSE-CAPILLARY: 126 mg/dL — AB (ref 65–99)
GLUCOSE-CAPILLARY: 89 mg/dL (ref 65–99)
GLUCOSE-CAPILLARY: 100 mg/dL — AB (ref 65–99)
Glucose-Capillary: 99 mg/dL (ref 65–99)
Glucose-Capillary: 131 mg/dL — ABNORMAL HIGH (ref 65–99)
Glucose-Capillary: 57 mg/dL — ABNORMAL LOW (ref 65–99)
Glucose-Capillary: 77 mg/dL (ref 65–99)

## 2017-08-11 MED ORDER — ORAL CARE MOUTH RINSE
15.0000 mL | Freq: Two times a day (BID) | OROMUCOSAL | Status: DC
  Administered 2017-08-12 – 2017-08-21 (×17): 15 mL via OROMUCOSAL

## 2017-08-11 MED ORDER — PIPERACILLIN-TAZOBACTAM IN DEX 2-0.25 GM/50ML IV SOLN
2.2500 g | Freq: Four times a day (QID) | INTRAVENOUS | Status: DC
  Administered 2017-08-11 – 2017-08-20 (×35): 2.25 g via INTRAVENOUS
  Filled 2017-08-11 (×39): qty 50

## 2017-08-11 MED ORDER — ENSURE ENLIVE PO LIQD: 237.0000 mL | Freq: Two times a day (BID) | ORAL | Status: DC

## 2017-08-11 MED ORDER — AMIODARONE LOAD VIA INFUSION: 150.0000 mg | Freq: Once | INTRAVENOUS | Status: DC

## 2017-08-11 MED ORDER — DILTIAZEM HCL 100 MG IV SOLR
5.0000 mg/h | INTRAVENOUS | Status: DC
  Administered 2017-08-11 – 2017-08-12 (×2): 5 mg/h via INTRAVENOUS
  Filled 2017-08-11 (×3): qty 100

## 2017-08-11 MED ORDER — CHLORHEXIDINE GLUCONATE 0.12 % MT SOLN
15.0000 mL | Freq: Two times a day (BID) | OROMUCOSAL | Status: DC
  Administered 2017-08-11 – 2017-08-14 (×4): 15 mL via OROMUCOSAL
  Filled 2017-08-11: qty 15

## 2017-08-11 MED ORDER — AMIODARONE IV BOLUS ONLY 150 MG/100ML
150.0000 mg | Freq: Once | INTRAVENOUS | Status: AC
  Administered 2017-08-11: 150 mg via INTRAVENOUS
  Filled 2017-08-11: qty 100

## 2017-08-11 MED ORDER — DEXTROSE 50 % IV SOLN
INTRAVENOUS | Status: AC
  Administered 2017-08-11: 50 mL via INTRAMUSCULAR
  Filled 2017-08-11: qty 50

## 2017-08-11 MED ORDER — LEVALBUTEROL HCL 1.25 MG/0.5ML IN NEBU
1.2500 mg | INHALATION_SOLUTION | Freq: Three times a day (TID) | RESPIRATORY_TRACT | Status: DC
  Administered 2017-08-11 – 2017-08-15 (×13): 1.25 mg via RESPIRATORY_TRACT
  Filled 2017-08-11 (×13): qty 0.5

## 2017-08-11 MED ORDER — PIPERACILLIN-TAZOBACTAM 3.375 G IVPB: 3.3750 g | Freq: Three times a day (TID) | INTRAVENOUS | Status: DC

## 2017-08-11 MED ORDER — METOCLOPRAMIDE HCL 5 MG/ML IJ SOLN
10.0000 mg | Freq: Four times a day (QID) | INTRAMUSCULAR | Status: AC
  Administered 2017-08-12 – 2017-08-15 (×14): 10 mg via INTRAVENOUS
  Filled 2017-08-11 (×14): qty 2

## 2017-08-11 MED ORDER — ENOXAPARIN SODIUM 30 MG/0.3ML ~~LOC~~ SOLN
30.0000 mg | Freq: Every day | SUBCUTANEOUS | Status: DC
  Administered 2017-08-11 – 2017-08-13 (×3): 30 mg via SUBCUTANEOUS
  Filled 2017-08-11 (×3): qty 0.3

## 2017-08-11 MED ORDER — POTASSIUM CHLORIDE 10 MEQ/50ML IV SOLN
10.0000 meq | INTRAVENOUS | Status: AC
  Administered 2017-08-11 (×2): 10 meq via INTRAVENOUS
  Filled 2017-08-11 (×2): qty 50

## 2017-08-11 MED ORDER — PANTOPRAZOLE SODIUM 40 MG IV SOLR
40.0000 mg | INTRAVENOUS | Status: DC
  Administered 2017-08-11 – 2017-08-23 (×13): 40 mg via INTRAVENOUS
  Filled 2017-08-11 (×13): qty 40

## 2017-08-11 NOTE — Care Management Note (Signed)
Case Management Note Previous Note Created by Michail Jewels  Patient Details  Name: Stanley Robles MRN: [id number] Date of Birth: 05/28/1932  Subjective/Objective:  Pt presented for Chest Pain- s/p cardiac cath that revealed severe ostial left main disease as well as ostial circumflex with total right coronary artery disease. CVTS following to see if pt is a candidate for CABG. PT Recommendations for [facility] PT once stable.                  Action/Plan: CM will continue to monitor for disposition needs.   Expected Discharge Date:                  Expected Discharge Plan:  Skilled Nursing Facility  In-House Referral:  Clinical Social Work  Discharge planning Services  CM Consult  Post Acute Care Choice:    Choice offered to:     DME Arranged:    DME Agency:     HH Arranged:    [place] Agency:     Status of Service:  In process, will continue to follow  If discussed at Long Length of Stay Meetings, dates discussed:    Additional Comments: 08/11/2017 Pt is now s/p CABG.  SNF recommended - CSW to speak with family (daughter as requested regarding SNF as discharge plan).  CM will continue to follow for discharge needs Maryclare Labrador, RN 08/11/2017, 2:32 PM

## 2017-08-11 NOTE — Significant Event (Signed)
Hypoglycemic Event  CBG: 45  Treatment: 1 amp dextrose  Symptoms: unresponsive  Follow-up CBG: Time:0800 CBG Result:131  Possible Reasons for Event: patient not wanting to eat  Comments/MD notified:Dr. Prescott Gum notified    Marjorie Smolder

## 2017-08-11 NOTE — Progress Notes (Signed)
S/p ABG, Dr Lucianne Lei Tright ordered bipap.  RN at bedside- pt appears to be tolerating well currently.  Procedure explained to pt and family.

## 2017-08-11 NOTE — Progress Notes (Addendum)
CT Surgery  Change in pulmonary status- tachypnea with low PO2 on ABG Placed on BIPAP Bicarb 19 but PCO2 < 30, pH 7.5 I   NTS'ed pt of thick secretions- sent for C&S Zosyn started and pt NPO with emesis today- poss aspiration pneumonia  I discussed change in condition with family

## 2017-08-11 NOTE — Progress Notes (Signed)
PHARMACY NOTE:  ANTIMICROBIAL RENAL DOSAGE ADJUSTMENT  Current antimicrobial regimen includes a mismatch between antimicrobial dosage and estimated renal function.  As per policy approved by the Pharmacy & Therapeutics and Medical Executive Committees, the antimicrobial dosage will be adjusted accordingly.  Current antimicrobial dosage:  Zosyn 3.375gm IV Q8H, 4 hr infusion   Indication: sepsis  Renal Function:  Estimated Creatinine Clearance: 18.9 mL/min (A) (by C-G formula based on SCr of 3.2 mg/dL (H)). []      On intermittent HD, scheduled: []      On CRRT    Antimicrobial dosage has been changed to:  Zosyn 2.25gm IV Q6H  Additional comments:   Thank you for allowing pharmacy to be a part of this patient's care.   Rontae Inglett D. Mina Marble, PharmD, BCPS Pager:  [phone] 08/11/2017, 5:20 PM

## 2017-08-11 NOTE — Progress Notes (Signed)
Notified Dr. [place] Gum via OR nurse of patients glucose and RR 35-45 and increased work of breathing.  No new orders at this time.  Will continue to monitor.

## 2017-08-11 NOTE — Progress Notes (Signed)
Initial Nutrition Assessment  DOCUMENTATION CODES:   Obesity unspecified  INTERVENTION:  Ensure Enlive (thickened to medically appropriate consistency) po BID, each supplement provides 350 kcal and 20 grams of protein  Monitor for diet advancement   NUTRITION DIAGNOSIS:   Increased nutrient needs related to acute illness (s/p CABG 08/07/17) as evidenced by estimated needs.  GOAL:   Patient will meet greater than or equal to 90% of their needs  MONITOR:   PO intake, I & O's, Supplement acceptance, Weight trends  REASON FOR ASSESSMENT:   LOS    ASSESSMENT:   Pt with PMH of HLD, PVD, CAD, gout, BPH, GERD, TIA, CKD stage III, memory loss presented with chest pain found NSTEMI s/p CABG 08/07/17.    Pt unable to answer RD questions at time of visit. Pt complaining of side pain.  Pt unsure about weight status. Per chart, pt's weight history seems to have trended upwards over the past few years.  SLP following pt. Pt currently on Dysphagia 2 diet with nectar thick liquids. Per chart review pt has consumed 25-100% of meals in the past week.  Pt has experienced few hypoglycemic episodes. This morning, RN recorded "patient not wanting to eat" as possible reason for event. Given pt's increased nutrient needs following surgery and decreased PO intake, pt would benefit from a oral nutrition supplement. RD to order.    Labs reviewed; CBG 57-131, Na 133, Potassium 3.2, Hemoglobin 8.8 Medications reviewed; Colace, Reglan, Protonix   Nutrition focused physical exam completed. Findings include no fat or muscle depletion and no edema.   Diet Order:  DIET DYS 2 Room service appropriate? Yes; Fluid consistency: Nectar Thick  Skin:  Wound (see comment) (Incisions at chest, R leg, L knee)  Last BM:  08/05/17  Height:   Ht Readings from Last 1 Encounters:  08/07/17 5\' 7"  (1.702 m)    Weight:   Wt Readings from Last 1 Encounters:  08/11/17 195 lb 1.7 oz (88.5 kg)    Ideal Body  Weight:  67 kg  BMI:  Body mass index is 30.56 kg/m.  Estimated Nutritional Needs:   Kcal:  1800-2000  Protein:  105-115 grams  Fluid:  >/= 1.8 L/d  EDUCATION NEEDS:   Education needs no appropriate at this time  Parks Ranger, MS, RDN, LDN 08/11/2017 1:49 PM

## 2017-08-11 NOTE — Progress Notes (Signed)
Subjective:  Appreciate care of Cardiac surgeons.  Currently has gone into atrial fibrillation has a history of that in past.  On anticoagulant prior to admission.  Mild dysphagia.  Some ARF currently    Objective:  Vital Signs in the last 24 hours: BP (!) 159/68   Pulse (!) 105   Temp 97.9 F (36.6 C) (Oral)   Resp 18   Ht 5\' 7"  (1.702 m)   Wt 88.5 kg (195 lb 1.7 oz)   SpO2 98%   BMI 30.56 kg/m   Physical Exam: Elderly WM in NAD Lungs:  Reduced BS Cardiac:  irregular rhythm, normal S1 and S2, no S3 Extremities:  No edema present  Intake/Output from previous day: 09/17 0701 - 09/18 0700 In: 1149.2 [P.O.:450; I.V.:649.2; IV Piggyback:50] Out: 1250 [Urine:1250] Weight Filed Weights   08/09/17 0630 08/10/17 0600 08/11/17 0500  Weight: 86.9 kg (191 lb 9.3 oz) 86.6 kg (190 lb 14.7 oz) 88.5 kg (195 lb 1.7 oz)    Lab Results: Basic Metabolic Panel:  Recent Labs  08/10/17 1559 08/10/17 1613 08/11/17 0320  NA 131* 133* 133*  K 3.7 3.7 3.2*  CL 98* 96* 101  CO2 24  --  23  GLUCOSE 140* 133* 99  BUN 33* 30* 37*  CREATININE 3.09* 3.00* 3.18*    CBC:  Recent Labs  08/10/17 0846 08/10/17 1559 08/10/17 1613 08/11/17 0320  WBC 13.6* 15.5*  --  12.1*  NEUTROABS 11.9* 13.4*  --   --   HGB 9.0* 9.3* 9.2* 8.8*  HCT 25.9* 27.5* 27.0* 25.4*  MCV 98.9 99.3  --  97.7  PLT 120* 134*  --  123*    BNP    Component Value Date/Time   BNP 560.8 (H) 08/01/2017 0057    Telemetry: Atrial fib rate response somewhat up  Assessment/Plan:  1. Recent CABG 2. ARF on top of chronic 3. Prior stroke and dysphagia 4. Atrial fibrillation might consider cardioversion prior to d/c if does not convert spontaneously  Appreciate surgical attention so much.        Kerry Hough  MD [facility] Card[facility]  08/11/2017, 9:16 AM

## 2017-08-11 NOTE — Progress Notes (Signed)
4 Days Post-Op Procedure(s) (LRB): CORONARY ARTERY BYPASS GRAFTING (CABG) x 2 WITH ENDOSCOPIC HARVESTING OF RIGHT SAPHENOUS VEIN. (N/A) TRANSESOPHAGEAL ECHOCARDIOGRAM (TEE) (N/A) Subjective: Creat still rising 3.0 but making urine afib on amio- add low dose diltiazem Walking with PT Hx CVA, ITP ,CKD Objective: Vital signs in last 24 hours: Temp:  [97.7 F (36.5 C)-98.6 F (37 C)] 97.7 F (36.5 C) (09/18 1522) Pulse Rate:  [101-119] 112 (09/18 1516) Cardiac Rhythm: Atrial fibrillation (09/18 1200) Resp:  [15-30] 20 (09/18 1516) BP: (92-160)/(44-82) 114/68 (09/18 1516) SpO2:  [95 %-100 %] 98 % (09/18 1517) FiO2 (%):  [28 %] 28 % (09/18 1517) Weight:  [195 lb 1.7 oz (88.5 kg)] 195 lb 1.7 oz (88.5 kg) (09/18 0500)  Hemodynamic parameters for last 24 hours: CVP:  [3 mmHg-16 mmHg] 16 mmHg  Intake/Output from previous day: 09/17 0701 - 09/18 0700 In: 1149.2 [P.O.:450; I.V.:649.2; IV Piggyback:50] Out: 1250 [Urine:1250] Intake/Output this shift: Total I/O In: 212.7 [I.V.:162.7; IV Piggyback:50] Out: 460 [Urine:460]       Exam    General- alert and comfortable   Lungs- clear without rales, wheezes   Cor- regular rate and rhythm, no murmur , gallop   Abdomen- soft, non-tender   Extremities - warm, non-tender, minimal edema   Neuro- oriented, appropriate, no focal weakness   Lab Results:  Recent Labs  08/10/17 1559  08/11/17 0320 08/11/17 1603  WBC 15.5*  --  12.1*  --   HGB 9.3*  < > 8.8* 9.5*  HCT 27.5*  < > 25.4* 28.0*  PLT 134*  --  123*  --   < > = values in this interval not displayed. BMET:  Recent Labs  08/10/17 1559  08/11/17 0320 08/11/17 1603  NA 131*  < > 133* 132*  K 3.7  < > 3.2* 3.7  CL 98*  < > 101 92*  CO2 24  --  23  --   GLUCOSE 140*  < > 99 251*  BUN 33*  < > 37* 36*  CREATININE 3.09*  < > 3.18* 3.20*  CALCIUM 8.2*  --  8.2*  --   < > = values in this interval not displayed.  PT/INR: No results for input(s): LABPROT, INR in the last 72  hours. ABG    Component Value Date/Time   PHART 7.538 (H) 08/08/2017 1938   HCO3 20.0 08/08/2017 1938   TCO2 22 08/11/2017 1603   ACIDBASEDEF 2.0 08/08/2017 1938   O2SAT 61.4 08/10/2017 0400   CBG (last 3)   Recent Labs  08/11/17 0804 08/11/17 1133 08/11/17 1518  GLUCAP 131* 77 89    Assessment/Plan: S/P Procedure(s) (LRB): CORONARY ARTERY BYPASS GRAFTING (CABG) x 2 WITH ENDOSCOPIC HARVESTING OF RIGHT SAPHENOUS VEIN. (N/A) TRANSESOPHAGEAL ECHOCARDIOGRAM (TEE) (N/A) Leave on low dose mil/dopamine until creat starts to reverse No coumadin or eliquis with CABG, ITP   LOS: 11 days    Tharon Aquas Trigt III 08/11/2017

## 2017-08-11 NOTE — Progress Notes (Signed)
  Speech Language Pathology Treatment: Dysphagia  Patient Details Name: Stanley Robles MRN: [id number] DOB: 11/21/1932 Today's Date: 08/11/2017 Time: 7262-0355 SLP Time Calculation (min) (ACUTE ONLY): 16 min  Assessment / Plan / Recommendation Clinical Impression  Pt currently receiving breathing treatment; spoke with RN and pt's daughter at bedside. Reviewed pt's assessments and ST sessions thus far. Daughter was surprised with "how well" he did this morning compared to normal. She is a Pharmacist, hospital and is with her father a lot in the summer and reported he frequently coughs with meals. She stated he coughed 2 times with breakfast. Pt currently coughing as SLP documents this note; just completed breathing treatment. Reiterated with daughter proper positioning during meals and meds. Will continue to follow.    HPI HPI: Pt is an 83 yo male admitted with NSTEMI s/p cath which showed severe left main disease and total right coronary artery stenosis, potential CABG planned for 9/14. 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 Was evaluated by ST this admission prior to CABG; MBS 08/05/17 revealed mild oropharyngeal and cervical esophageal dysphagia, suspected to be more chronic in nature given his h/o dysphagia from prior CVA and h/o GERD. Dys 3, thin liquids, no straws recommended with ST to follow briefly through surgery given his history. Pt was intubated for CABG on 9/14, transported back to floor on vent and subsequent extubated at 10pm same day. Swallow evaluation ordered due to pt coughing with meal this morning.       SLP Plan  Continue with current plan of care       Recommendations  Diet recommendations: Dysphagia 2 (fine chop);Nectar-thick liquid Liquids provided via: Cup;No straw Medication Administration: Whole meds with puree Supervision: Patient able to self feed;Full supervision/cueing  for compensatory strategies Compensations: Slow rate;Small sips/bites;Follow solids with liquid Postural Changes and/or Swallow Maneuvers: Seated upright 90 degrees                Oral Care Recommendations: Oral care BID Follow up Recommendations:  (TBD, possibly SNF) SLP Visit Diagnosis: Dysphagia, oropharyngeal phase (R13.12);Dysphagia, pharyngoesophageal phase (R13.14) Plan: Continue with current plan of care       GO                Houston Siren 08/11/2017, 9:27 AM  [name] [name] [employer] [phone]

## 2017-08-11 NOTE — NC FL2 (Signed)
[place] MEDICAID FL2 LEVEL OF CARE SCREENING TOOL     IDENTIFICATION  Patient Name: Stanley Robles Birthdate: [birth year] Sex: male Admission Date (Current Location): 07/31/2017  [facility] and [place] Number:  Herbalist and Address:  The [place], [place] [address] [place], [place] [address]      Provider Number: [id number]  Attending Physician Name and Address:  Ivin Poot, MD  Relative Name and Phone Number:       Current Level of Care: Hospital Recommended Level of Care: [place] Prior Approval Number:    Date Approved/Denied:   PASRR Number: [id number]  Discharge Plan: SNF    Current Diagnoses: Patient Active Problem List   Diagnosis Date Noted  . S/P CABG x 2 08/07/2017  . Thrombocytopenia ([place])   . NSTEMI (non-ST elevated myocardial infarction) ([place]) 07/31/2017  . BPH (benign prostatic hyperplasia) 04/03/2013  . CAD (coronary artery disease) 04/01/2013  . Paroxysmal atrial fibrillation ([place]) 04/01/2013  . Hyperlipidemia   . Hypertensive heart disease   . Personal history of transient ischemic attack (TIA) and cerebral infarction without residual deficit   . Long-term (current) use of anticoagulants   . Chronic kidney disease stage 3     Orientation RESPIRATION BLADDER Height & Weight     Self, Time, Situation, Place  O2 (2L [place]) Incontinent, Indwelling catheter Weight: 195 lb 1.7 oz (88.5 kg) Height:  5\' 7"  (170.2 cm)  BEHAVIORAL SYMPTOMS/MOOD NEUROLOGICAL BOWEL NUTRITION STATUS      Continent Diet (see DC summary)  AMBULATORY STATUS COMMUNICATION OF NEEDS Skin   Limited Assist Verbally Surgical wounds                       Personal Care Assistance Level of Assistance  Bathing, Dressing Bathing Assistance: Limited assistance   Dressing Assistance: Limited assistance     Functional Limitations Info             SPECIAL CARE FACTORS FREQUENCY  PT (By licensed PT), OT (By licensed OT)      PT Frequency: 5/wk OT Frequency: 5/wk            Contractures      Additional Factors Info  Code Status, Allergies, Insulin Sliding Scale Code Status Info: FULL Allergies Info: Lipitor Atorvastatin   Insulin Sliding Scale Info: 6/day       Current Medications (08/11/2017):  This is the current hospital active medication list Current Facility-Administered Medications  Medication Dose Route Frequency Provider Last Rate Last Dose  . 0.45 % sodium chloride infusion   Intravenous Continuous PRN Elgie Collard, PA-C   Stopped at 08/08/17 0900  . 0.9 %  sodium chloride infusion  250 mL Intravenous Continuous Conte, Tessa N, PA-C      . 0.9 %  sodium chloride infusion   Intravenous Continuous Elgie Collard, PA-C 10 mL/hr at 08/11/17 0700    . acetaminophen (TYLENOL) tablet 1,000 mg  1,000 mg Oral Q6H Conte, Tessa N, PA-C   1,000 mg at 08/11/17 1201   Or  . acetaminophen (TYLENOL) solution 1,000 mg  1,000 mg Per Tube Q6H Conte, Tessa N, PA-C      . allopurinol (ZYLOPRIM) tablet 200 mg  200 mg Oral Daily [place] Gum, Collier Salina, MD   200 mg at 08/11/17 1021  . amiodarone (NEXTERONE) IV bolus only 150 mg/100 mL  150 mg Intravenous Once Ivin Poot, MD      . aspirin EC  tablet 325 mg  325 mg Oral Daily Elgie Collard, [place]   325 mg at 08/11/17 4098   Or  . aspirin chewable tablet 324 mg  324 mg Per Tube Daily Conte, Tessa N, PA-C      . bisacodyl (DULCOLAX) EC tablet 10 mg  10 mg Oral Daily Nicholes Rough N, [place]   10 mg at 08/10/17 1191   Or  . bisacodyl (DULCOLAX) suppository 10 mg  10 mg Rectal Daily Elgie Collard, PA-C   10 mg at 08/11/17 0843  . cefTAZidime (FORTAZ) 1 g in dextrose 5 % 50 mL IVPB  1 g Intravenous Q24H Ivin Poot, MD   Stopped at 08/11/17 1116  . Chlorhexidine Gluconate Cloth 2 % PADS 6 each  6 each Topical Daily Ivin Poot, MD   6 each at 08/11/17 1000  . diltiazem (CARDIZEM) 100 mg in dextrose 5 % 100 mL (1 mg/mL) infusion  5 mg/hr Intravenous Titrated [place] Gum, Collier Salina, MD 5 mL/hr at 08/11/17 0833 5 mg/hr at 08/11/17 0833  . docusate sodium (COLACE) capsule 200 mg  200 mg Oral Daily Nicholes Rough N, PA-C   200 mg at 08/11/17 4782  . DOPamine (INTROPIN) 800 mg in dextrose 5 % 250 mL (3.2 mg/mL) infusion  0-20 mcg/kg/min Intravenous Titrated [place] Gum, Collier Salina, MD 3.2 mL/hr at 08/11/17 0700 2 mcg/kg/min at 08/11/17 0700  . enoxaparin (LOVENOX) injection 30 mg  30 mg Subcutaneous Daily [place] Gum, Collier Salina, MD   30 mg at 08/11/17 1021  . feeding supplement (ENSURE ENLIVE) (ENSURE ENLIVE) liquid 237 mL  237 mL Oral BID BM [place] Gum, Collier Salina, MD      . finasteride (PROSCAR) tablet 5 mg  5 mg Oral Daily Nicholes Rough N, PA-C   5 mg at 08/11/17 9562  . furosemide (LASIX) injection 40 mg  40 mg Intravenous BID Ivin Poot, MD   40 mg at 08/11/17 1308  . insulin aspart (novoLOG) injection 0-24 Units  0-24 Units Subcutaneous Q4H Ivin Poot, MD   Stopped at 08/11/17 0800  . lactated ringers infusion   Intravenous Continuous Elgie Collard, PA-C 10 mL/hr at 08/10/17 0800    . levalbuterol (XOPENEX) nebulizer solution 1.25 mg  1.25 mg Nebulization TID [place] Gum, Collier Salina, MD   1.25 mg at 08/11/17 1517  . metoCLOPramide (REGLAN) injection 5 mg  5 mg Intravenous Q6H [place] Gum, Collier Salina, MD   5 mg at 08/11/17 1201  . metoprolol tartrate (LOPRESSOR) tablet 12.5 mg  12.5 mg Oral BID Nicholes Rough N, PA-C   12.5 mg at 08/11/17 6578   Or  . metoprolol tartrate (LOPRESSOR) 25 mg/10 mL oral suspension 12.5 mg  12.5 mg Per Tube BID Harriet Pho, Tessa N, PA-C      . metoprolol tartrate (LOPRESSOR) injection 2.5-5 mg  2.5-5 mg Intravenous Q2H PRN Elgie Collard, PA-C   5 mg at 08/08/17 4696  . milrinone (PRIMACOR) 20 MG/100 ML (0.2 mg/mL) infusion  0.25 mcg/kg/min Intravenous Continuous [place] Gum, Collier Salina, MD 5.9 mL/hr at 08/11/17 0700 0.25 mcg/kg/min at 08/11/17 0700  . ondansetron (ZOFRAN) injection 4 mg  4 mg Intravenous Q6H PRN Harriet Pho, Tessa N, PA-C      . oxyCODONE (Oxy IR/ROXICODONE)  immediate release tablet 5 mg  5 mg Oral Q6H PRN Ivin Poot, MD   5 mg at 08/11/17 1253  . pantoprazole (PROTONIX) EC tablet 40 mg  40 mg Oral Daily Elgie Collard, [place]   40  mg at 08/11/17 0843  . [place]   Oral PRN [place] Gum, Collier Salina, MD      . rosuvastatin (CRESTOR) tablet 10 mg  10 mg Oral q1800 [place] Gum, Collier Salina, MD   10 mg at 08/10/17 1802  . sodium chloride flush (NS) 0.9 % injection 10-40 mL  10-40 mL Intracatheter Q12H Ivin Poot, MD   10 mL at 08/10/17 2132  . sodium chloride flush (NS) 0.9 % injection 10-40 mL  10-40 mL Intracatheter PRN [place] Gum, Collier Salina, MD      . sodium chloride flush (NS) 0.9 % injection 10-40 mL  10-40 mL Intracatheter Q12H [place] Gum, Collier Salina, MD   10 mL at 08/11/17 1046  . sodium chloride flush (NS) 0.9 % injection 10-40 mL  10-40 mL Intracatheter PRN [place] Gum, Collier Salina, MD      . sodium chloride flush (NS) 0.9 % injection 3 mL  3 mL Intravenous Q12H Conte, Tessa N, PA-C   3 mL at 08/10/17 2132  . sodium chloride flush (NS) 0.9 % injection 3 mL  3 mL Intravenous PRN Conte, Tessa N, PA-C      . traMADol Veatrice Bourbon) tablet 50-100 mg  50-100 mg Oral Q4H PRN Conte, Tessa N, PA-C   50 mg at 08/08/17 1801     Discharge Medications: Please see discharge summary for a list of discharge medications.  Relevant Imaging Results:  Relevant Lab Results:   Additional Information SS#: [id number]  Jorge Ny, LCSW

## 2017-08-12 ENCOUNTER — Inpatient Hospital Stay (HOSPITAL_COMMUNITY): Payer: Medicare Other

## 2017-08-12 ENCOUNTER — Other Ambulatory Visit (HOSPITAL_COMMUNITY): Payer: Medicare Other

## 2017-08-12 LAB — COOXEMETRY PANEL
Carboxyhemoglobin: 0.7 % (ref 0.5–1.5)
Methemoglobin: 1.2 % (ref 0.0–1.5)
O2 Saturation: 57.3 %
Total hemoglobin: 10.6 g/dL — ABNORMAL LOW (ref 12.0–16.0)

## 2017-08-12 LAB — URINALYSIS, ROUTINE W REFLEX MICROSCOPIC
Bilirubin Urine: NEGATIVE
GLUCOSE, UA: NEGATIVE mg/dL
Ketones, ur: NEGATIVE mg/dL
Nitrite: NEGATIVE
PROTEIN: 30 mg/dL — AB
SPECIFIC GRAVITY, URINE: 1.015 (ref 1.005–1.030)
pH: 5 (ref 5.0–8.0)

## 2017-08-12 LAB — GLUCOSE, CAPILLARY
GLUCOSE-CAPILLARY: 94 mg/dL (ref 65–99)
GLUCOSE-CAPILLARY: 105 mg/dL — AB (ref 65–99)
Glucose-Capillary: 97 mg/dL (ref 65–99)
Glucose-Capillary: 109 mg/dL — ABNORMAL HIGH (ref 65–99)
Glucose-Capillary: 126 mg/dL — ABNORMAL HIGH (ref 65–99)
Glucose-Capillary: 101 mg/dL — ABNORMAL HIGH (ref 65–99)
Glucose-Capillary: 149 mg/dL — ABNORMAL HIGH (ref 65–99)

## 2017-08-12 LAB — POCT I-STAT 3, ART BLOOD GAS (G3+)
ACID-BASE DEFICIT: 1 mmol/L (ref 0.0–2.0)
Acid-base deficit: 3 mmol/L — ABNORMAL HIGH (ref 0.0–2.0)
BICARBONATE: 21.1 mmol/L (ref 20.0–28.0)
BICARBONATE: 18.9 mmol/L — AB (ref 20.0–28.0)
O2 SAT: 99 %
O2 Saturation: 94 %
PH ART: 7.516 — AB (ref 7.350–7.450)
PO2 ART: 61 mmHg — AB (ref 83.0–108.0)
Patient temperature: 98.2
Patient temperature: 99.8
TCO2: 22 mmol/L (ref 22–32)
TCO2: 20 mmol/L — AB (ref 22–32)
pCO2 arterial: 25.9 mmHg — ABNORMAL LOW (ref 32.0–48.0)
pCO2 arterial: 23.5 mmHg — ABNORMAL LOW (ref 32.0–48.0)
pH, Arterial: 7.518 — ABNORMAL HIGH (ref 7.350–7.450)
pO2, Arterial: 112 mmHg — ABNORMAL HIGH (ref 83.0–108.0)

## 2017-08-12 LAB — POCT I-STAT, CHEM 8
BUN: 45 mg/dL — AB (ref 6–20)
Calcium, Ion: 1.07 mmol/L — ABNORMAL LOW (ref 1.15–1.40)
Chloride: 98 mmol/L — ABNORMAL LOW (ref 101–111)
Creatinine, Ser: 4 mg/dL — ABNORMAL HIGH (ref 0.61–1.24)
Glucose, Bld: 141 mg/dL — ABNORMAL HIGH (ref 65–99)
HEMATOCRIT: 23 % — AB (ref 39.0–52.0)
Hemoglobin: 7.8 g/dL — ABNORMAL LOW (ref 13.0–17.0)
POTASSIUM: 3.8 mmol/L (ref 3.5–5.1)
Sodium: 133 mmol/L — ABNORMAL LOW (ref 135–145)
TCO2: 22 mmol/L (ref 22–32)

## 2017-08-12 LAB — BASIC METABOLIC PANEL
Anion gap: 13 (ref 5–15)
BUN: 50 mg/dL — AB (ref 6–20)
CALCIUM: 8.3 mg/dL — AB (ref 8.9–10.3)
CO2: 21 mmol/L — ABNORMAL LOW (ref 22–32)
CREATININE: 4.06 mg/dL — AB (ref 0.61–1.24)
Chloride: 100 mmol/L — ABNORMAL LOW (ref 101–111)
GFR calc Af Amer: 14 mL/min — ABNORMAL LOW (ref >60)
GFR calc non Af Amer: 12 mL/min — ABNORMAL LOW (ref >60)
GLUCOSE: 151 mg/dL — AB (ref 65–99)
POTASSIUM: 3.8 mmol/L (ref 3.5–5.1)
Sodium: 134 mmol/L — ABNORMAL LOW (ref 135–145)

## 2017-08-12 LAB — COMPREHENSIVE METABOLIC PANEL
ALT: 30 U/L (ref 17–63)
AST: 30 U/L (ref 15–41)
Albumin: 2.2 g/dL — ABNORMAL LOW (ref 3.5–5.0)
Alkaline Phosphatase: 57 U/L (ref 38–126)
Anion gap: 10 (ref 5–15)
BUN: 45 mg/dL — ABNORMAL HIGH (ref 6–20)
CO2: 24 mmol/L (ref 22–32)
Calcium: 8.1 mg/dL — ABNORMAL LOW (ref 8.9–10.3)
Chloride: 99 mmol/L — ABNORMAL LOW (ref 101–111)
Creatinine, Ser: 3.62 mg/dL — ABNORMAL HIGH (ref 0.61–1.24)
GFR calc Af Amer: 17 mL/min — ABNORMAL LOW (ref >60)
GFR calc non Af Amer: 14 mL/min — ABNORMAL LOW (ref >60)
Glucose, Bld: 108 mg/dL — ABNORMAL HIGH (ref 65–99)
Potassium: 3.5 mmol/L (ref 3.5–5.1)
Sodium: 133 mmol/L — ABNORMAL LOW (ref 135–145)
Total Bilirubin: 0.9 mg/dL (ref 0.3–1.2)
Total Protein: 4.7 g/dL — ABNORMAL LOW (ref 6.5–8.1)

## 2017-08-12 LAB — CBC
HCT: 24.6 % — ABNORMAL LOW (ref 39.0–52.0)
Hemoglobin: 8.5 g/dL — ABNORMAL LOW (ref 13.0–17.0)
MCH: 33.9 pg (ref 26.0–34.0)
MCHC: 34.6 g/dL (ref 30.0–36.0)
MCV: 98 fL (ref 78.0–100.0)
Platelets: 129 10*3/uL — ABNORMAL LOW (ref 150–400)
RBC: 2.51 MIL/uL — ABNORMAL LOW (ref 4.22–5.81)
RDW: 14.8 % (ref 11.5–15.5)
WBC: 9 10*3/uL (ref 4.0–10.5)

## 2017-08-12 LAB — PREPARE RBC (CROSSMATCH)

## 2017-08-12 MED ORDER — LIDOCAINE HCL (PF) 1 % IJ SOLN
INTRAMUSCULAR | Status: AC
  Filled 2017-08-12: qty 30

## 2017-08-12 MED ORDER — LIDOCAINE HCL (PF) 1 % IJ SOLN
INTRAMUSCULAR | Status: AC
  Filled 2017-08-12: qty 5

## 2017-08-12 MED ORDER — ORAL CARE MOUTH RINSE: 15.0000 mL | Freq: Four times a day (QID) | OROMUCOSAL | Status: DC

## 2017-08-12 MED ORDER — SODIUM BICARBONATE 8.4 % IV SOLN
50.0000 meq | Freq: Once | INTRAVENOUS | Status: AC
  Administered 2017-08-12: 50 meq via INTRAVENOUS
  Filled 2017-08-12 (×2): qty 50

## 2017-08-12 MED ORDER — HEPARIN SODIUM (PORCINE) 1000 UNIT/ML DIALYSIS
1000.0000 [IU] | INTRAMUSCULAR | Status: DC | PRN
  Administered 2017-08-12: 3600 [IU] via INTRAVENOUS_CENTRAL
  Administered 2017-08-17: 2400 [IU] via INTRAVENOUS_CENTRAL
  Filled 2017-08-12: qty 6
  Filled 2017-08-12: qty 4
  Filled 2017-08-12 (×2): qty 6

## 2017-08-12 MED ORDER — SODIUM CHLORIDE 0.9 % IV SOLN: INTRAVENOUS | Status: DC | PRN

## 2017-08-12 MED ORDER — CHLORHEXIDINE GLUCONATE 0.12% ORAL RINSE (MEDLINE KIT)
15.0000 mL | Freq: Two times a day (BID) | OROMUCOSAL | Status: DC
  Administered 2017-08-12: 15 mL via OROMUCOSAL

## 2017-08-12 NOTE — Progress Notes (Signed)
Physical Therapy Treatment Patient Details Name: Stanley Robles MRN: [id number] DOB: [birth year] Today's Date: 08/12/2017    History of Present Illness 83 yo admitted with NSTEMI s/p cath which showed severe left main disease and total right coronary artery stenosis, s/p CABG 08/08/17. PMhx:  CVA, sensorineural hearing loss, HLD, PVD, pAF, CKD stage 3, HTN, NSTEMI, chronic diastolic heart failure    PT Comments    Pt admitted with above diagnosis. Pt currently with functional limitations due to the deficits listed below (see PT Problem List). Pt was able to ambulate with rW with min assist with chair follow.  Pt with poor postural stability needing cues and assist for upright posture.  Fatigues quickly.  Limited distance due to this.  HAd to have 6LO2 to keep sats >90%.  Will continue acute PT. Pt will benefit from skilled PT to increase their independence and safety with mobility to allow discharge to the venue listed below.     Follow Up Recommendations  SNF     Equipment Recommendations  Other (comment) (to be determined at next venue)    Recommendations for Other Services       Precautions / Restrictions Precautions Precautions: Fall;Sternal Restrictions Weight Bearing Restrictions: Yes (sternal precautions)    Mobility  Bed Mobility Overal bed mobility: Needs Assistance Bed Mobility: Supine to Sit     Supine to sit: Mod assist     General bed mobility comments: modA for trunk to upright to maintian sternal precautions  Transfers Overall transfer level: Needs assistance Equipment used: Rolling walker (2 wheeled) Transfers: Sit to/from Stand Sit to Stand: Min assist         General transfer comment: minA for power up and cues for hand placement, vc for trunk to upright which pt never achieved.  Pt states he "has never stood tall"  Ambulation/Gait Ambulation/Gait assistance: Mod assist;+2 safety/equipment Ambulation Distance (Feet): 50 Feet (30 feet and then 20  feet) Assistive device: Rolling walker (2 wheeled) Gait Pattern/deviations: Step-through pattern;Trunk flexed;Shuffle;Decreased step length - right;Decreased step length - left;Wide base of support;Antalgic Gait velocity: slowed Gait velocity interpretation: Below normal speed for age/gender General Gait Details: modA for steadying with Rolling walker, unable to correct posture despite cues for head up with pt with incr trunk flexion that pt states is premorbid.  Had to have 2 sitting rest breaks.  As he fatigues, posture worsens and he has DOE 3/4.  HR 113-131 bpm, Other VSS.  Pt was on 4LO2 at rest with sats 96%.  Did incr pt to 6LO2 with ambulation to keep sats >90%.  OF note, pt with left lateral lean in standing that worsens with fatigue as well.    Stairs            Wheelchair Mobility    Modified Rankin (Stroke Patients Only)       Balance Overall balance assessment: Needs assistance Sitting-balance support: Feet supported;No upper extremity supported Sitting balance-Leahy Scale: Poor Sitting balance - Comments: Pt was unable to sit without assist as he had significant left lateral lean.  Postural control: Left lateral lean Standing balance support: Bilateral upper extremity supported Standing balance-Leahy Scale: Poor Standing balance comment: requires minA with RW.                             Cognition Arousal/Alertness: Awake/alert Behavior During Therapy: WFL for tasks assessed/performed Overall Cognitive Status: Within Functional Limits for tasks assessed  Exercises General Exercises - Lower Extremity Long Arc Quad: AROM;5 reps;Both;Seated    General Comments        Pertinent Vitals/Pain Pain Assessment: Faces Faces Pain Scale: Hurts little more Pain Location: sternum Pain Descriptors / Indicators: Constant;Aching Pain Intervention(s): Limited activity within patient's tolerance;Monitored  during session;Premedicated before session;Repositioned    Home Living                      Prior Function            PT Goals (current goals can now be found in the care plan section) Progress towards PT goals: Progressing toward goals    Frequency    Min 3X/week      PT Plan Current plan remains appropriate    Co-evaluation              AM-PAC PT "6 Clicks" Daily Activity  Outcome Measure  Difficulty turning over in bed (including adjusting bedclothes, sheets and blankets)?: Unable Difficulty moving from lying on back to sitting on the side of the bed? : Unable Difficulty sitting down on and standing up from a chair with arms (e.g., wheelchair, bedside commode, etc,.)?: A Lot Help needed moving to and from a bed to chair (including a wheelchair)?: A Little Help needed walking in hospital room?: A Lot Help needed climbing 3-5 steps with a railing? : Total 6 Click Score: 10    End of Session Equipment Utilized During Treatment: Gait belt;Oxygen Activity Tolerance: Patient limited by fatigue Patient left: in chair;with call bell/phone within reach;with chair alarm set Nurse Communication: Mobility status PT Visit Diagnosis: Difficulty in walking, not elsewhere classified (R26.2);Muscle weakness (generalized) (M62.81)     Time: 7412-8786 PT Time Calculation (min) (ACUTE ONLY): 26 min  Charges:  $Gait Training: 23-37 mins                    G Codes:       Stanley Robles,PT Acute Rehabilitation [phone] (pager)    Stanley Robles 08/12/2017, 10:35 AM

## 2017-08-12 NOTE — Op Note (Signed)
NAMEMIHAILO, SAGE NO.:  [id number]  MEDICAL RECORD NO.:  [id number]  LOCATION:  [facility]                        FACILITY:  [facility]  PHYSICIAN:  Ivin Poot, M.D.  DATE OF BIRTH:  [birth year]  DATE OF PROCEDURE:  08/12/2017 DATE OF DISCHARGE:                              OPERATIVE REPORT   OPERATION:  Placement of hemodialysis catheter for acute postoperative renal failure.  SURGEON:  Ivin Poot, MD.  ANESTHESIA:  Local with 1% lidocaine.  INDICATIONS:  Informed consent was obtained from the patient and family for placement of a Trialysis catheter for temporary postoperative hemodialysis for renal failure after CABG in this 83 year old chronically ill male with history of preoperative chronic renal failure.  DESCRIPTION OF PROCEDURE:  The left chest was prepped and draped as a sterile field.  A proper time-out was performed.  1% lidocaine was infiltrated beneath the distal third of the left clavicle.  Using the Seldinger technique, the left subclavian vein was cannulated and guidewire passed proximally.  Over the guidewire to dilators, then the Trialysis catheter was easily passed.  The catheter was flushed and secured to the skin with silk sutures and a sterile dressing was applied including the Biopatch.  A chest x-ray is pending.     Ivin Poot, M.D.     PV/MEDQ  D:  08/12/2017  T:  08/12/2017  Job:  [id number]

## 2017-08-12 NOTE — Progress Notes (Signed)
Subjective:  Required Bipap last night but breathing is better this am. Not currently SOB, Cr. Rising but still making urine. No chest pain.  C/o nausea  Objective:  Vital Signs in the last 24 hours: BP 100/62   Pulse 83   Temp 97.7 F (36.5 C) (Oral)   Resp (!) 26   Ht 5\' 7"  (1.702 m)   Wt 89 kg (196 lb 3.4 oz)   SpO2 95%   BMI 30.73 kg/m   Physical Exam: Elderly WM in NAD, HOH Lungs:  Reduced BS Cardiac:  irregular rhythm, normal S1 and S2, no S3 Extremities:  1+ edema present  Intake/Output from previous day: 09/18 0701 - 09/19 0700 In: 796.5 [I.V.:596.5; IV Piggyback:200] Out: 1575 [Urine:1575] Weight Filed Weights   08/10/17 0600 08/11/17 0500 08/12/17 0500  Weight: 86.6 kg (190 lb 14.7 oz) 88.5 kg (195 lb 1.7 oz) 89 kg (196 lb 3.4 oz)    Lab Results: Basic Metabolic Panel:  Recent Labs  08/11/17 0320 08/11/17 1603 08/12/17 0346  NA 133* 132* 133*  K 3.2* 3.7 3.5  CL 101 92* 99*  CO2 23  --  24  GLUCOSE 99 251* 108*  BUN 37* 36* 45*  CREATININE 3.18* 3.20* 3.62*    CBC:  Recent Labs  08/10/17 0846 08/10/17 1559  08/11/17 0320 08/11/17 1603 08/12/17 0346  WBC 13.6* 15.5*  --  12.1*  --  9.0  NEUTROABS 11.9* 13.4*  --   --   --   --   HGB 9.0* 9.3*  < > 8.8* 9.5* 8.5*  HCT 25.9* 27.5*  < > 25.4* 28.0* 24.6*  MCV 98.9 99.3  --  97.7  --  98.0  PLT 120* 134*  --  123*  --  129*  < > = values in this interval not displayed.  BNP    Component Value Date/Time   BNP 560.8 (H) 08/01/2017 0057    Telemetry: Atrial fib rate response somewhat up on dopamine  Assessment/Plan:  1. Recent CABG 2. ARF on top of chronic 3. Prior stroke and dysphagia 4. Atrial fibrillation might consider cardioversion prior to d/c if does not convert spontaneously  Hemodynamics might improve with sinus rhythm.         Kerry Hough  MD [facility]  08/12/2017, 9:05 AM

## 2017-08-12 NOTE — Progress Notes (Signed)
CT [place]  after informed consent an HD Trialysis catheter wasplaced L subclavian vein PCXR ordered

## 2017-08-12 NOTE — Clinical Social Work Note (Signed)
Clinical Social Work Assessment  Patient Details  Name: Stanley Robles MRN: [id number] Date of Birth: Oct 26, 1932  Date of referral:  08/11/17               Reason for consult:  Facility Placement                Permission sought to share information with:  Family Supports, Chartered certified accountant granted to share information::  Yes, Verbal Permission Granted  Name::     Theatre manager::  SNF  Relationship::  dtr  Contact Information:     Housing/Transportation Living arrangements for the past 2 months:  Single Family Home Source of Information:  Patient Patient Interpreter Needed:  None Criminal Activity/Legal Involvement Pertinent to Current Situation/Hospitalization:  No - Comment as needed Significant Relationships:  Adult Children Lives with:  Self Do you feel safe going back to the place where you live?  No Need for family participation in patient care:  Yes (Comment) (help with decision making at this time)  Care giving concerns:  Pt lives at home alone and normally independent- now with increased impairment and family not able to provide 24 hour assist for safe return home.    Social Worker assessment / plan:  CSW first met patient to discuss PT recommendation for SNF- patient hard of hearing but also does not want to discuss anything important without a daughter present.    CSW met back with patient and pt dtr Stanley Robles to discuss.  Stanley Robles states pt has been to [facility] in the past after a hospital stay and that they had a good experience.  Employment status:  Retired Nurse, adult PT Recommendations:  [place] / Referral to community resources:  [place]  Patient/Family's Response to care:  Dtr agreeable to SNF placement at [place] and hopeful for [facility] where patient had good results in previous stay  Patient/Family's Understanding of and Emotional Response to Diagnosis, Current  Treatment, and Prognosis:  Good understanding of patient condition- optimistic that patient will improve quickly at rehab and be able to return home soon.  Emotional Assessment Appearance:  Appears stated age Attitude/Demeanor/Rapport:    Affect (typically observed):  Appropriate Orientation:  Oriented to Self, Oriented to Place, Oriented to  Time, Oriented to Situation Alcohol / Substance use:  Not Applicable Psych involvement (Current and /or in the community):     Discharge Needs  Concerns to be addressed:  Care Coordination Readmission within the last 30 days:  No Current discharge risk:  Physical Impairment Barriers to Discharge:  Continued Medical Work up   Jorge Ny, LCSW 08/12/2017, 8:54 AM

## 2017-08-12 NOTE — Progress Notes (Signed)
5 Days Post-Op Procedure(s) (LRB): CORONARY ARTERY BYPASS GRAFTING (CABG) x 2 WITH ENDOSCOPIC HARVESTING OF RIGHT SAPHENOUS VEIN. (N/A) TRANSESOPHAGEAL ECHOCARDIOGRAM (TEE) (N/A) Subjective: Patient patient supported on BiPAP overnight No more GI problems, emesis Assessment by SLT demonstrates high risk for aspiration Chest x-ray appears with increased edema, possible pneumonia Acute on chronic renal failure continues to worsen with decline in urine output We'll plan on placement of HD catheter for hemodialysis-discussed with family Patient has probably aspirated with pneumonitis, progressive renal insufficiency approaching dialysis and with subsequent decreased mental status.   Objective: Vital signs in last 24 hours: Temp:  [97.7 F (36.5 C)-99.7 F (37.6 C)] 99.7 F (37.6 C) (09/19 1400) Pulse Rate:  [71-124] 122 (09/19 1400) Cardiac Rhythm: Atrial fibrillation (09/19 0900) Resp:  [16-34] 27 (09/19 1400) BP: (84-156)/(27-84) 84/27 (09/19 1400) SpO2:  [95 %-100 %] 99 % (09/19 1400) FiO2 (%):  [70 %] 70 % (09/18 2007) Weight:  [196 lb 3.4 oz (89 kg)] 196 lb 3.4 oz (89 kg) (09/19 0500)  Hemodynamic parameters for last 24 hours: CVP:  [11 mmHg-22 mmHg] 15 mmHg  Intake/Output from previous day: 09/18 0701 - 09/19 0700 In: 796.5 [I.V.:596.5; IV Piggyback:200] Out: 1575 [Urine:1575] Intake/Output this shift: Total I/O In: 118.7 [I.V.:68.7; IV Piggyback:50] Out: 175 [Urine:175]       Exam    General- responsive but appears sedated   Lungs-bilateral coarse breath sounds with rales,    Cor- atrial fibrillation rhythm, no murmur , gallop   Abdomen- soft, non-tender   Extremities - warm, non-tender, 2+ edema   Neuro- decreased level of responsiveness, no focal weakness   Lab Results:  Recent Labs  08/11/17 0320 08/11/17 1603 08/12/17 0346  WBC 12.1*  --  9.0  HGB 8.8* 9.5* 8.5*  HCT 25.4* 28.0* 24.6*  PLT 123*  --  129*   BMET:  Recent Labs  08/11/17 0320  08/11/17 1603 08/12/17 0346  NA 133* 132* 133*  K 3.2* 3.7 3.5  CL 101 92* 99*  CO2 23  --  24  GLUCOSE 99 251* 108*  BUN 37* 36* 45*  CREATININE 3.18* 3.20* 3.62*  CALCIUM 8.2*  --  8.1*    PT/INR: No results for input(s): LABPROT, INR in the last 72 hours. ABG    Component Value Date/Time   PHART 7.518 (H) 08/12/2017 0747   HCO3 21.1 08/12/2017 0747   TCO2 22 08/12/2017 0747   ACIDBASEDEF 1.0 08/12/2017 0747   O2SAT 57.3 08/12/2017 0748   CBG (last 3)   Recent Labs  08/11/17 2359 08/12/17 0351 08/12/17 0808  GLUCAP 126* 94 97    Assessment/Plan: S/P Procedure(s) (LRB): CORONARY ARTERY BYPASS GRAFTING (CABG) x 2 WITH ENDOSCOPIC HARVESTING OF RIGHT SAPHENOUS VEIN. (N/A) TRANSESOPHAGEAL ECHOCARDIOGRAM (TEE) (N/A) Increasing uremia, fluid retention, decreased oxygenation and responsiveness We'll place a hemodialysis catheter Patient will require BiPAP Continues in atrial fibrillation despite amiodarone which has not helped. Because patient has chronic A. fib. We will discontinue Amiodarone   LOS: 12 days    Stanley Robles 08/12/2017

## 2017-08-12 NOTE — Consult Note (Signed)
Cyncere Sontag Admit Date: 07/31/2017 08/12/2017 Rexene Agent Requesting Physician:  Prescott Gum MD  Reason for Consult:  [id number] HPI:  83 year old male seen at the request of Dr.Van Trigt for evaluation of acute on chronic renal insufficiency.  Patient was admitted 07/31/17 with angina and non-STEMI demonstrating severe multivessel disease and he underwent 2 vessel CABG on 08/07/17. He has had a slow recovery since the operation with issues including atrophic fibrillation with RVR requiring intermittent diltiazem drip, aspiration with concern for pneumonia on 9/18.  He remains on inotropes, dopamine.  PMH Incudes:  CKD3-4, follows with Dr. Posey Pronto in our office. Baseline creatinine mid 1s  Likely ITP, mild  Status post left TKR  History of stroke, 2016  PVD  Hypertension on ACE inhibitor as outpatient  BPH on finasteride  Since surgery patient has developed progressive renal failure, as trended below. Using parenteral Lasix his urine output has been adequate but has dropped off today.  Potassium and bicarbonate are stable. He has required BiPAP intermittently.  Thoracic surgery plans to place dialysis catheter today. Patient has not had a renal ultrasound.   Creatinine, Ser (mg/dL)  Date Value  08/12/2017 3.62 (H)  08/11/2017 3.20 (H)  08/11/2017 3.18 (H)  08/10/2017 3.00 (H)  08/10/2017 3.09 (H)  08/10/2017 2.95 (H)  08/09/2017 2.50 (H)  08/09/2017 2.38 (H)  08/08/2017 2.21 (H)  08/08/2017 1.80 (H)  ] I/Os:  ROS NSAIDS: No exposure identified IV Contrast no exposure identified Balance of 12 systems is negative w/ exceptions as above  PMH  Past Medical History:  Diagnosis Date  . BPH (benign prostatic hypertrophy)   . Cancer ([place])    CANCEROUS MOLE REMOVED FROM BACK - SEVERAL YRS AGO  . Chronic kidney disease stage 3   . Coronary artery disease    DR. TILLEY IS PT'S CARDIOLGIST  . GERD (gastroesophageal reflux disease)   . Gout   . Hyperlipidemia   .  Hypertensive heart disease   . Incontinence of urine   . Memory loss   . Myocardial infarction ([place]) 1994  . Osteoarthritis   . Paroxysmal atrial fibrillation (H[facility])   . Peripheral vascular disease ([place])   . Personal history of transient ischemic attack (TIA) and cerebral infarction without residual deficit   . Right pontine stroke ([place]) 02/27/2015  . Tooth disease    PSH  Past Surgical History:  Procedure Laterality Date  . CARDIAC CATHETERIZATION  07/21/2005  . CORONARY ARTERY BYPASS GRAFT N/A 08/07/2017   Procedure: CORONARY ARTERY BYPASS GRAFTING (CABG) x 2 WITH ENDOSCOPIC HARVESTING OF RIGHT SAPHENOUS VEIN.;  Surgeon: Ivin Poot, MD;  Location: [facility];  Service: Open Heart Surgery;  Laterality: N/A;  . HERNIA REPAIR  12/26/2002   BILATERAL INGUINAL HERNIA REPAIR  . LEFT HEART CATH AND CORONARY ANGIOGRAPHY N/A 08/03/2017   Procedure: LEFT HEART CATH AND CORONARY ANGIOGRAPHY;  Surgeon: Sherren Mocha, MD;  Location: [facility];  Service: Cardiovascular;  Laterality: N/A;  . MASTOID SURGERY X 5    . PROSTATE SURGERY    . TEE WITHOUT CARDIOVERSION N/A 08/07/2017   Procedure: TRANSESOPHAGEAL ECHOCARDIOGRAM (TEE);  Surgeon: Prescott Gum, Collier Salina, MD;  Location: [facility];  Service: Open Heart Surgery;  Laterality: N/A;  . TOTAL KNEE ARTHROPLASTY Left 03/28/2013   Procedure: LEFT TOTAL KNEE ARTHROPLASTY;  Surgeon: Gearlean Alf, MD;  Location: [facility];  Service: Orthopedics;  Laterality: Left;   FH  Family History  Problem Relation Age of Onset  . Hypertension Father   .  Hypertension Mother   . Hyperlipidemia Mother   . Cancer Brother    Barry  reports that he has quit smoking. He has never used smokeless tobacco. He reports that he does not drink alcohol or use drugs. Allergies  Allergies  Allergen Reactions  . Lipitor [Atorvastatin] Other (See Comments)    Severe constipation   Home medications Prior to Admission medications   Medication Sig Start Date End Date Taking?  Authorizing Provider  acetaminophen (TYLENOL) 500 MG tablet Take 1,000 mg by mouth every 6 (six) hours as needed for pain.   Yes [provider]  allopurinol (ZYLOPRIM) 300 MG tablet 300 mg daily. 10/17/16  Yes [provider]  apixaban (ELIQUIS) 2.5 MG TABS tablet Take 1 tablet (2.5 mg total) by mouth 2 (two) times daily. 03/02/15  Yes Thurnell Lose, MD  atenolol (TENORMIN) 25 MG tablet Take 1 tablet (25 mg total) by mouth 2 (two) times daily. Patient taking differently: Take 25 mg by mouth daily.  04/04/13  Yes Bonnielee Haff, MD  Cholecalciferol (VITAMIN D3) 2000 units TABS Take 2,000 Units by mouth daily.   Yes [provider]  docusate sodium (COLACE) 100 MG capsule Take 100 mg by mouth daily.   Yes [provider]  finasteride (PROSCAR) 5 MG tablet Take 5 mg by mouth daily.   Yes [provider]  lisinopril (PRINIVIL,ZESTRIL) 20 MG tablet 20 mg daily. 11/13/15  Yes [provider]  MYRBETRIQ 50 MG TB24 tablet Take 50 mg by mouth daily. 07/30/17  Yes [provider]  nitroGLYCERIN (NITROSTAT) 0.4 MG SL tablet Place 0.4 mg under the tongue every 5 (five) minutes as needed for chest pain.   Yes [provider]  omeprazole (PRILOSEC) 20 MG capsule Take 20 mg by mouth daily.   Yes [provider]  rosuvastatin (CRESTOR) 40 MG tablet 40 mg daily. 08/21/16  Yes [provider]    Current Medications Scheduled Meds: . acetaminophen  1,000 mg Oral Q6H   Or  . acetaminophen (TYLENOL) oral liquid 160 mg/5 mL  1,000 mg Per Tube Q6H  . allopurinol  200 mg Oral Daily  . aspirin EC  325 mg Oral Daily   Or  . aspirin  324 mg Per Tube Daily  . bisacodyl  10 mg Oral Daily   Or  . bisacodyl  10 mg Rectal Daily  . chlorhexidine  15 mL Mouth Rinse BID  . Chlorhexidine Gluconate Cloth  6 each Topical Daily  . docusate sodium  200 mg Oral Daily  . enoxaparin (LOVENOX) injection  30 mg Subcutaneous Daily  .  finasteride  5 mg Oral Daily  . furosemide  40 mg Intravenous BID  . insulin aspart  0-24 Units Subcutaneous Q4H  . levalbuterol  1.25 mg Nebulization TID  . mouth rinse  15 mL Mouth Rinse q12n4p  . metoCLOPramide (REGLAN) injection  10 mg Intravenous Q6H  . metoprolol tartrate  12.5 mg Oral BID   Or  . metoprolol tartrate  12.5 mg Per Tube BID  . pantoprazole (PROTONIX) IV  40 mg Intravenous Q24H  . rosuvastatin  10 mg Oral q1800  . sodium chloride flush  10-40 mL Intracatheter Q12H  . sodium chloride flush  10-40 mL Intracatheter Q12H  . sodium chloride flush  3 mL Intravenous Q12H   Continuous Infusions: . sodium chloride Stopped (08/08/17 0900)  . sodium chloride    . sodium chloride Stopped (08/11/17 2100)  . DOPamine 2 mcg/kg/min (08/12/17 0800)  .  lactated ringers 10 mL/hr at 08/10/17 0800  . milrinone 0.25 mcg/kg/min (08/12/17 1118)  . piperacillin-tazobactam (ZOSYN)  IV 2.25 g (08/12/17 1118)   PRN Meds:.sodium chloride, heparin, metoprolol tartrate, ondansetron (ZOFRAN) IV, oxyCODONE, RESOURCE THICKENUP CLEAR, sodium chloride flush, sodium chloride flush, sodium chloride flush, traMADol  CBC  Recent Labs Lab 08/06/17 1357  08/10/17 0846 08/10/17 1559  08/11/17 0320 08/11/17 1603 08/12/17 0346  WBC 4.7  < > 13.6* 15.5*  --  12.1*  --  9.0  NEUTROABS 2.6  --  11.9* 13.4*  --   --   --   --   HGB 15.9  < > 9.0* 9.3*  < > 8.8* 9.5* 8.5*  HCT 45.9  < > 25.9* 27.5*  < > 25.4* 28.0* 24.6*  MCV 102.5*  < > 98.9 99.3  --  97.7  --  98.0  PLT 95*  < > 120* 134*  --  123*  --  129*  < > = values in this interval not displayed. Basic Metabolic Panel  Recent Labs Lab 08/08/17 0428  08/08/17 1656 08/09/17 0355 08/09/17 1533 08/10/17 0846 08/10/17 1559 08/10/17 1613 08/11/17 0320 08/11/17 1603 08/12/17 0346  NA 135  < > 134* 136 135 133* 131* 133* 133* 132* 133*  K 3.7  < > 4.0 3.6 3.9 3.5 3.7 3.7 3.2* 3.7 3.5  CL 106  < > 103 104 98* 100* 98* 96* 101 92* 99*   CO2 22  --  22 24  --  23 24  --  23  --  24  GLUCOSE 108*  < > 153* 116* 116* 122* 140* 133* 99 251* 108*  BUN 17  < > 19 22* 24* 30* 33* 30* 37* 36* 45*  CREATININE 1.48*  < > 2.21* 2.38* 2.50* 2.95* 3.09* 3.00* 3.18* 3.20* 3.62*  CALCIUM 7.9*  --  8.0* 8.3*  --  8.2* 8.2*  --  8.2*  --  8.1*  < > = values in this interval not displayed.  Physical Exam  Blood pressure (!) 130/37, pulse (!) 116, temperature 98.7 F (37.1 C), temperature source Oral, resp. rate (!) 26, height 5\' 7"  (1.702 m), weight 89 kg (196 lb 3.4 oz), SpO2 96 %. GEN: appears unwell ENT: NCAT, HOH EYES: EOMI CV: IRIR, tachy, no rub PULM: diminished BS throughout ABD: s/nt SKIN: no rashes/lesions EXT:3+ LEE in legs  Assessment 30M wih AoCKD3 s/p 2V CABG 9/14, aspiration events 9/18  1. AoCKD3, less UOP today, baseline SCr in 1.5-2.0 2. CAD s/p 2V CABG 08/07/17; rmeains on dopamine / milrinone 3. BPH on outpt finasteride, foley present currently 4. AFib with RVR, on amio, on/off diltiazem gtt 5. Chronic mild TCP, seen by [place], ?ITP  Plan 1. Patient potentially to require dialysis the next 24 hours, agree with placement of dialysis catheter 2. This is especially true if urine output continues to drop off 3. Renal ultrasound and urinalysis ordered, though this is likely ATN following CABG 4. Twice daily labs, follow closely, continue supportive care   Pearson Grippe MD [phone] pgr 08/12/2017, 1:05 PM

## 2017-08-12 NOTE — Progress Notes (Signed)
Patient ID: Stanley Robles, male   DOB: 11-Oct-1932, 83 y.o.   MRN: [id number] SICU Evening Rounds:  Hemodynamically stable on milrinone 0.25, dop 3.  Put on bipap for respiratory failure today. Sats 100%. Mental status reportedly better on bipap.  Urine output has been marginal with creat up to 4.0. Dialysis catheter inserted this afternoon.   BMET    Component Value Date/Time   NA 133 (L) 08/12/2017 1554   K 3.8 08/12/2017 1554   CL 98 (L) 08/12/2017 1554   CO2 21 (L) 08/12/2017 0400   GLUCOSE 141 (H) 08/12/2017 1554   BUN 45 (H) 08/12/2017 1554   CREATININE 4.00 (H) 08/12/2017 1554   CALCIUM 8.3 (L) 08/12/2017 0400   GFRNONAA 12 (L) 08/12/2017 0400   GFRAA 14 (L) 08/12/2017 0400    CBC    Component Value Date/Time   WBC 9.0 08/12/2017 0346   RBC 2.51 (L) 08/12/2017 0346   HGB 7.8 (L) 08/12/2017 1554   HCT 23.0 (L) 08/12/2017 1554   PLT 129 (L) 08/12/2017 0346   MCV 98.0 08/12/2017 0346   MCH 33.9 08/12/2017 0346   MCHC 34.6 08/12/2017 0346   RDW 14.8 08/12/2017 0346   LYMPHSABS 1.1 08/10/2017 1559   MONOABS 1.0 08/10/2017 1559   EOSABS 0.0 08/10/2017 1559   BASOSABS 0.0 08/10/2017 1559    Hgb slightly down to 7.8. May benefit from transfusion after dialysis started.

## 2017-08-12 NOTE — Plan of Care (Signed)
Problem: Skin Integrity: Goal: Risk for impaired skin integrity will decrease Outcome: Progressing Turning Q2hours  Problem: Tissue Perfusion: Goal: Risk factors for ineffective tissue perfusion will decrease Outcome: Progressing SCDs in place  Problem: Bowel/Gastric: Goal: Will not experience complications related to bowel motility Outcome: Progressing Patient passing gas overnight without bowel movement.

## 2017-08-12 NOTE — Progress Notes (Signed)
  Speech Language Pathology Treatment: Dysphagia  Patient Details Name: Stanley Robles MRN: [id number] DOB: 06-07-1932 Today's Date: 08/12/2017 Time: 9030-0923 SLP Time Calculation (min) (ACUTE ONLY): 12 min  Assessment / Plan / Recommendation Clinical Impression  Stopped by RN to observe pt due to having difficulty with pills this morning (increased respirations, oral delays and decreased manipulation). Pt dyspneic on arrival, decreased overall alertness. Manipulated and transited whole pill in applesauce with appropriate time and function. No outward indications of aspiration however with increased work of breathing and reduced alertness, recommend converting pills to IV if able. If not, give only vital meds crushed in applesauce. Pt now NPO due to ileus. Will plan to follow up Fri.    HPI HPI: Pt is an 83 yo male admitted with NSTEMI s/p cath which showed severe left main disease and total right coronary artery stenosis, potential CABG planned for 9/14. 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 Was evaluated by ST this admission prior to CABG; MBS 08/05/17 revealed mild oropharyngeal and cervical esophageal dysphagia, suspected to be more chronic in nature given his h/o dysphagia from prior CVA and h/o GERD. Dys 3, thin liquids, no straws recommended with ST to follow briefly through surgery given his history. Pt was intubated for CABG on 9/14, transported back to floor on vent and subsequent extubated at 10pm same day. Swallow evaluation ordered due to pt coughing with meal this morning.       SLP Plan  Continue with current plan of care       Recommendations  Diet recommendations: NPO (due to ileus)                Oral Care Recommendations: Oral care QID Follow up Recommendations:  (TBD) SLP Visit Diagnosis: Dysphagia, oropharyngeal phase (R13.12);Dysphagia, pharyngoesophageal phase  (R13.14) Plan: Continue with current plan of care       [place], Stanley Robles 08/12/2017, 3:38 PM   Stanley Robles.[employer] [phone]

## 2017-08-13 ENCOUNTER — Inpatient Hospital Stay (HOSPITAL_COMMUNITY): Payer: Medicare Other

## 2017-08-13 ENCOUNTER — Inpatient Hospital Stay (HOSPITAL_COMMUNITY): Payer: Medicare Other | Admitting: Certified Registered"

## 2017-08-13 LAB — COMPREHENSIVE METABOLIC PANEL
ALT: 25 U/L (ref 17–63)
AST: 25 U/L (ref 15–41)
Albumin: 2.1 g/dL — ABNORMAL LOW (ref 3.5–5.0)
Alkaline Phosphatase: 61 U/L (ref 38–126)
Anion gap: 15 (ref 5–15)
BUN: 55 mg/dL — ABNORMAL HIGH (ref 6–20)
CO2: 21 mmol/L — ABNORMAL LOW (ref 22–32)
Calcium: 8 mg/dL — ABNORMAL LOW (ref 8.9–10.3)
Chloride: 99 mmol/L — ABNORMAL LOW (ref 101–111)
Creatinine, Ser: 4.45 mg/dL — ABNORMAL HIGH (ref 0.61–1.24)
GFR calc Af Amer: 13 mL/min — ABNORMAL LOW (ref >60)
GFR calc non Af Amer: 11 mL/min — ABNORMAL LOW (ref >60)
Glucose, Bld: 132 mg/dL — ABNORMAL HIGH (ref 65–99)
Potassium: 3.6 mmol/L (ref 3.5–5.1)
Sodium: 135 mmol/L (ref 135–145)
Total Bilirubin: 1.4 mg/dL — ABNORMAL HIGH (ref 0.3–1.2)
Total Protein: 4.7 g/dL — ABNORMAL LOW (ref 6.5–8.1)

## 2017-08-13 LAB — RENAL FUNCTION PANEL
ALBUMIN: 2.2 g/dL — AB (ref 3.5–5.0)
Anion gap: 15 (ref 5–15)
BUN: 46 mg/dL — AB (ref 6–20)
CALCIUM: 8.1 mg/dL — AB (ref 8.9–10.3)
CO2: 21 mmol/L — AB (ref 22–32)
CREATININE: 3.62 mg/dL — AB (ref 0.61–1.24)
Chloride: 101 mmol/L (ref 101–111)
GFR calc Af Amer: 17 mL/min — ABNORMAL LOW (ref >60)
GFR calc non Af Amer: 14 mL/min — ABNORMAL LOW (ref >60)
GLUCOSE: 100 mg/dL — AB (ref 65–99)
Phosphorus: 3.8 mg/dL (ref 2.5–4.6)
Potassium: 3.3 mmol/L — ABNORMAL LOW (ref 3.5–5.1)
SODIUM: 137 mmol/L (ref 135–145)

## 2017-08-13 LAB — CBC
HCT: 26 % — ABNORMAL LOW (ref 39.0–52.0)
Hemoglobin: 9.1 g/dL — ABNORMAL LOW (ref 13.0–17.0)
MCH: 33.8 pg (ref 26.0–34.0)
MCHC: 35 g/dL (ref 30.0–36.0)
MCV: 96.7 fL (ref 78.0–100.0)
Platelets: 129 10*3/uL — ABNORMAL LOW (ref 150–400)
RBC: 2.69 MIL/uL — ABNORMAL LOW (ref 4.22–5.81)
RDW: 16.2 % — ABNORMAL HIGH (ref 11.5–15.5)
WBC: 9.8 10*3/uL (ref 4.0–10.5)

## 2017-08-13 LAB — CULTURE, RESPIRATORY W GRAM STAIN

## 2017-08-13 LAB — TYPE AND SCREEN
ABO/RH(D): O POS
Antibody Screen: NEGATIVE
Unit division: 0

## 2017-08-13 LAB — GLUCOSE, CAPILLARY
GLUCOSE-CAPILLARY: 101 mg/dL — AB (ref 65–99)
GLUCOSE-CAPILLARY: 119 mg/dL — AB (ref 65–99)
Glucose-Capillary: 117 mg/dL — ABNORMAL HIGH (ref 65–99)
Glucose-Capillary: 121 mg/dL — ABNORMAL HIGH (ref 65–99)
Glucose-Capillary: 126 mg/dL — ABNORMAL HIGH (ref 65–99)
Glucose-Capillary: 138 mg/dL — ABNORMAL HIGH (ref 65–99)
Glucose-Capillary: 97 mg/dL (ref 65–99)

## 2017-08-13 LAB — POCT I-STAT, CHEM 8
BUN: 40 mg/dL — ABNORMAL HIGH (ref 6–20)
CALCIUM ION: 1.1 mmol/L — AB (ref 1.15–1.40)
Chloride: 102 mmol/L (ref 101–111)
Creatinine, Ser: 3.5 mg/dL — ABNORMAL HIGH (ref 0.61–1.24)
Glucose, Bld: 100 mg/dL — ABNORMAL HIGH (ref 65–99)
HCT: 26 % — ABNORMAL LOW (ref 39.0–52.0)
Hemoglobin: 8.8 g/dL — ABNORMAL LOW (ref 13.0–17.0)
Potassium: 3.4 mmol/L — ABNORMAL LOW (ref 3.5–5.1)
SODIUM: 138 mmol/L (ref 135–145)
TCO2: 22 mmol/L (ref 22–32)

## 2017-08-13 LAB — BLOOD GAS, ARTERIAL
Acid-base deficit: 2.8 mmol/L — ABNORMAL HIGH (ref 0.0–2.0)
Bicarbonate: 20.1 mmol/L (ref 20.0–28.0)
Drawn by: 40415
O2 Content: 4 L/min
O2 Saturation: 93.1 %
Patient temperature: 98.6
pCO2 arterial: 26.8 mmHg — ABNORMAL LOW (ref 32.0–48.0)
pH, Arterial: 7.487 — ABNORMAL HIGH (ref 7.350–7.450)
pO2, Arterial: 67.8 mmHg — ABNORMAL LOW (ref 83.0–108.0)

## 2017-08-13 LAB — BPAM RBC
Blood Product Expiration Date: 201810122359
ISSUE DATE / TIME: 201809191953
Unit Type and Rh: 5100

## 2017-08-13 LAB — COOXEMETRY PANEL
Carboxyhemoglobin: 0.9 % (ref 0.5–1.5)
Methemoglobin: 1.2 % (ref 0.0–1.5)
O2 Saturation: 46.9 %
Total hemoglobin: 10.3 g/dL — ABNORMAL LOW (ref 12.0–16.0)

## 2017-08-13 LAB — PHOSPHORUS: Phosphorus: 4.7 mg/dL — ABNORMAL HIGH (ref 2.5–4.6)

## 2017-08-13 LAB — MAGNESIUM: Magnesium: 2.1 mg/dL (ref 1.7–2.4)

## 2017-08-13 MED ORDER — POTASSIUM CHLORIDE 10 MEQ/50ML IV SOLN
10.0000 meq | Freq: Once | INTRAVENOUS | Status: AC
  Administered 2017-08-13: 10 meq via INTRAVENOUS
  Filled 2017-08-13: qty 50

## 2017-08-13 MED ORDER — HEPARIN (PORCINE) IN NACL 100-0.45 UNIT/ML-% IJ SOLN
900.0000 [IU]/h | INTRAMUSCULAR | Status: DC
  Administered 2017-08-13: 1000 [IU]/h via INTRAVENOUS
  Administered 2017-08-15: 950 [IU]/h via INTRAVENOUS
  Administered 2017-08-16 – 2017-08-18 (×3): 1250 [IU]/h via INTRAVENOUS
  Administered 2017-08-20 (×2): 1100 [IU]/h via INTRAVENOUS
  Administered 2017-08-21: 900 [IU]/h via INTRAVENOUS
  Filled 2017-08-13 (×9): qty 250

## 2017-08-13 MED ORDER — SODIUM CHLORIDE 0.9 % IV SOLN
0.0000 ug/min | INTRAVENOUS | Status: DC
  Administered 2017-08-13: 20 ug/min via INTRAVENOUS
  Filled 2017-08-13 (×2): qty 1

## 2017-08-13 MED ORDER — PRISMASOL BGK 4/2.5 32-4-2.5 MEQ/L IV SOLN
INTRAVENOUS | Status: DC
  Administered 2017-08-13 – 2017-08-16 (×6): via INTRAVENOUS_CENTRAL
  Filled 2017-08-13 (×11): qty 5000

## 2017-08-13 MED ORDER — SODIUM CHLORIDE 0.9 % IV SOLN: INTRAVENOUS | Status: DC | PRN

## 2017-08-13 MED ORDER — PRISMASOL BGK 4/2.5 32-4-2.5 MEQ/L IV SOLN
INTRAVENOUS | Status: DC
  Administered 2017-08-13 – 2017-08-17 (×16): via INTRAVENOUS_CENTRAL
  Filled 2017-08-13 (×23): qty 5000

## 2017-08-13 MED ORDER — PRISMASOL BGK 4/2.5 32-4-2.5 MEQ/L IV SOLN
INTRAVENOUS | Status: DC
  Administered 2017-08-13 – 2017-08-16 (×8): via INTRAVENOUS_CENTRAL
  Filled 2017-08-13 (×13): qty 5000

## 2017-08-13 MED ORDER — TRAVASOL 10 % IV SOLN: INTRAVENOUS | Status: DC

## 2017-08-13 MED ORDER — TRACE MINERALS CR-CU-MN-SE-ZN 10-1000-500-60 MCG/ML IV SOLN
INTRAVENOUS | Status: AC
  Administered 2017-08-13: 17:00:00 via INTRAVENOUS
  Filled 2017-08-13: qty 720

## 2017-08-13 MED ORDER — BISACODYL 10 MG RE SUPP
10.0000 mg | Freq: Once | RECTAL | Status: AC
  Administered 2017-08-13: 10 mg via RECTAL
  Filled 2017-08-13: qty 1

## 2017-08-13 MED ORDER — HEPARIN SODIUM (PORCINE) 1000 UNIT/ML DIALYSIS
1000.0000 [IU] | INTRAMUSCULAR | Status: DC | PRN
  Filled 2017-08-13: qty 6

## 2017-08-13 NOTE — Progress Notes (Signed)
Admit: 07/31/2017 LOS: 53  74M wih AoCKD3 s/p 2V CABG 9/14, aspiration events 9/18  Subjective:  renal function coninues to worsen, 0.8 L  Urine output yesterday Req PE gtt Renal US w/o obstruction, +medical renal disease  09/19 0701 - 09/20 0700 In: 836.9 [I.V.:273.4; Blood:363.5; IV Piggyback:200] Out: 825 [Urine:825]  Filed Weights   08/11/17 0500 08/12/17 0500 08/13/17 0500  Weight: 88.5 kg (195 lb 1.7 oz) 89 kg (196 lb 3.4 oz) 84.9 kg (187 lb 2.7 oz)    Scheduled Meds: . allopurinol  200 mg Oral Daily  . aspirin EC  325 mg Oral Daily   Or  . aspirin  324 mg Per Tube Daily  . bisacodyl  10 mg Oral Daily   Or  . bisacodyl  10 mg Rectal Daily  . chlorhexidine  15 mL Mouth Rinse BID  . Chlorhexidine Gluconate Cloth  6 each Topical Daily  . docusate sodium  200 mg Oral Daily  . enoxaparin (LOVENOX) injection  30 mg Subcutaneous Daily  . finasteride  5 mg Oral Daily  . furosemide  40 mg Intravenous BID  . insulin aspart  0-24 Units Subcutaneous Q4H  . levalbuterol  1.25 mg Nebulization TID  . mouth rinse  15 mL Mouth Rinse q12n4p  . metoCLOPramide (REGLAN) injection  10 mg Intravenous Q6H  . metoprolol tartrate  12.5 mg Oral BID   Or  . metoprolol tartrate  12.5 mg Per Tube BID  . pantoprazole (PROTONIX) IV  40 mg Intravenous Q24H  . rosuvastatin  10 mg Oral q1800  . sodium chloride flush  10-40 mL Intracatheter Q12H  . sodium chloride flush  10-40 mL Intracatheter Q12H  . sodium chloride flush  3 mL Intravenous Q12H   Continuous Infusions: . sodium chloride Stopped (08/08/17 0900)  . sodium chloride    . sodium chloride Stopped (08/11/17 2100)  . sodium chloride    . sodium chloride    . ADULT TPN    . DOPamine 2 mcg/kg/min (08/13/17 0750)  . lactated ringers 10 mL/hr at 08/10/17 0800  . milrinone 0.25 mcg/kg/min (08/13/17 0750)  . phenylephrine (NEO-SYNEPHRINE) Adult infusion 20 mcg/min (08/13/17 0828)  . piperacillin-tazobactam (ZOSYN)  IV 2.25 g (08/13/17  0521)   PRN Meds:.sodium chloride, Place/Maintain arterial line **AND** sodium chloride, Place/Maintain arterial line **AND** sodium chloride, heparin, metoprolol tartrate, ondansetron (ZOFRAN) IV, oxyCODONE, RESOURCE THICKENUP CLEAR, sodium chloride flush, sodium chloride flush, sodium chloride flush, traMADol  Current Labs: reviewed    Physical Exam:  Blood pressure (!) 91/56, pulse (!) 117, temperature 99.1 F (37.3 C), temperature source Core (Comment), resp. rate 16, height 5\' 7"  (1.702 m), weight 84.9 kg (187 lb 2.7 oz), SpO2 97 %. GEN: appears unwell ENT: NCAT, HOH EYES: EOMI CV: IRIR, tachy, no rub PULM: diminished BS throughout ABD: s/nt SKIN: no rashes/lesions EXT:3+ LEE in legs  A 1. AoCKD3, nonoliguric, baseline SCr in 1.5-2.0; likelyATN post CABG 2. CAD s/p 2V CABG 08/07/17; rmeains on dopamine / milrinone  3. BPH on outpt finasteride, foley present currently 4. AFib with RVR, on amio, on/off diltiazem gtt 5. Chronic mild TCP, seen by [facility], ?ITP  P 1. Start CRRT, 58mL net neg, all 4k, no heparin, 500/1000/300 flow.   2. Follow closely 3. Hold diuretics   Pearson Grippe MD 08/13/2017, 8:29 AM   Recent Labs Lab 08/12/17 0346 08/12/17 0400 08/12/17 1554 08/13/17 0223  NA 133* 134* 133* 135  K 3.5 3.8 3.8 3.6  CL 99* 100* 98* 99*  CO2 24 21*  --  21*  GLUCOSE 108* 151* 141* 132*  BUN 45* 50* 45* 55*  CREATININE 3.62* 4.06* 4.00* 4.45*  CALCIUM 8.1* 8.3*  --  8.0*    Recent Labs Lab 08/06/17 1357  08/10/17 0846 08/10/17 1559  08/11/17 0320  08/12/17 0346 08/12/17 1554 08/13/17 0223  WBC 4.7  < > 13.6* 15.5*  --  12.1*  --  9.0  --  9.8  NEUTROABS 2.6  --  11.9* 13.4*  --   --   --   --   --   --   HGB 15.9  < > 9.0* 9.3*  < > 8.8*  < > 8.5* 7.8* 9.1*  HCT 45.9  < > 25.9* 27.5*  < > 25.4*  < > 24.6* 23.0* 26.0*  MCV 102.5*  < > 98.9 99.3  --  97.7  --  98.0  --  96.7  PLT 95*  < > 120* 134*  --  123*  --  129*  --  129*  < > = values in this  interval not displayed.

## 2017-08-13 NOTE — Progress Notes (Signed)
Subjective:  Now with worsening renal function borderline BP.    Objective:  Vital Signs in the last 24 hours: BP (!) 101/59   Pulse (!) 126   Temp 99 F (37.2 C)   Resp (!) 23   Ht 5\' 7"  (1.702 m)   Wt 84.9 kg (187 lb 2.7 oz)   SpO2 99%   BMI 29.32 kg/m   Physical Exam: Elderly WM in NAD, HOH Lungs:  Reduced BS Cardiac:  irregular rhythm, normal S1 and S2, no S3 Extremities:  1+ edema present  Intake/Output from previous day: 09/19 0701 - 09/20 0700 In: 836.9 [I.V.:273.4; Blood:363.5; IV Piggyback:200] Out: 825 [Urine:825] Weight Filed Weights   08/11/17 0500 08/12/17 0500 08/13/17 0500  Weight: 88.5 kg (195 lb 1.7 oz) 89 kg (196 lb 3.4 oz) 84.9 kg (187 lb 2.7 oz)    Lab Results: Basic Metabolic Panel:  Recent Labs  08/12/17 0400 08/12/17 1554 08/13/17 0223  NA 134* 133* 135  K 3.8 3.8 3.6  CL 100* 98* 99*  CO2 21*  --  21*  GLUCOSE 151* 141* 132*  BUN 50* 45* 55*  CREATININE 4.06* 4.00* 4.45*    CBC:  Recent Labs  08/10/17 1559  08/12/17 0346 08/12/17 1554 08/13/17 0223  WBC 15.5*  < > 9.0  --  9.8  NEUTROABS 13.4*  --   --   --   --   HGB 9.3*  < > 8.5* 7.8* 9.1*  HCT 27.5*  < > 24.6* 23.0* 26.0*  MCV 99.3  < > 98.0  --  96.7  PLT 134*  < > 129*  --  129*  < > = values in this interval not displayed.  BNP    Component Value Date/Time   BNP 560.8 (H) 08/01/2017 0057    Telemetry: Atrial fib rate response somewhat up on dopamine  Assessment/Plan:  1. Recent CABG 2. ARF on top of chronic 3. Prior stroke and dysphagia 4. Atrial fibrillation might consider cardioversion prior to d/c if does not convert spontaneously  He does not have chronic atrial fib but paroxysmal.  He was in sinus on admission.  I would consider attempts to get him back into sinus rhythm to help with hemodynamics.         Kerry Hough  MD [facility]  08/13/2017, 10:25 AM

## 2017-08-13 NOTE — Progress Notes (Signed)
ANTICOAGULATION CONSULT NOTE - Initial Consult  Pharmacy Consult:  Heparin Indication: atrial fibrillation  Allergies  Allergen Reactions  . Lipitor [Atorvastatin] Other (See Comments)    Severe constipation    Patient Measurements: Height: 5\' 7"  (170.2 cm) Weight: 187 lb 2.7 oz (84.9 kg) IBW/kg (Calculated) : 66.1 Heparin Dosing Weight: 83 kg  Vital Signs: Temp: 97.9 F (36.6 C) (09/20 1600) Temp Source: Core (Comment) (09/20 1600) BP: 117/46 (09/20 1600) Pulse Rate: 110 (09/20 1600)  Labs:  Recent Labs  08/11/17 0320  08/12/17 0346  08/12/17 1554 08/13/17 0223 08/13/17 1534  HGB 8.8*  < > 8.5*  --  7.8* 9.1* 8.8*  HCT 25.4*  < > 24.6*  --  23.0* 26.0* 26.0*  PLT 123*  --  129*  --   --  129*  --   CREATININE 3.18*  < > 3.62*  < > 4.00* 4.45* 3.50*  < > = values in this interval not displayed.  Estimated Creatinine Clearance: 16.9 mL/min (A) (by C-G formula based on SCr of 3.5 mg/dL (H)).   Medical History: Past Medical History:  Diagnosis Date  . BPH (benign prostatic hypertrophy)   . Cancer ([place])    CANCEROUS MOLE REMOVED FROM BACK - SEVERAL YRS AGO  . Chronic kidney disease stage 3   . Coronary artery disease    DR. TILLEY IS PT'S CARDIOLGIST  . GERD (gastroesophageal reflux disease)   . Gout   . Hyperlipidemia   . Hypertensive heart disease   . Incontinence of urine   . Memory loss   . Myocardial infarction ([place]) 1994  . Osteoarthritis   . Paroxysmal atrial fibrillation ([facility])   . Peripheral vascular disease ([place])   . Personal history of transient ischemic attack (TIA) and cerebral infarction without residual deficit   . Right pontine stroke ([place]) 02/27/2015  . Tooth disease       Assessment: 17 YOM with history of Afib on Eliquis PTA, last dose 07/31/17 at 1800.  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  Now with stable hemoglobin and negative HIT Ab and SRA, Pharmacy consulted to transition patient to IV heparin.  Noted that patient has  thrombocytopenia and he received Lovenox 30mg  SQ around 0930 today.  No bleeding per RN.     Goal of Therapy:  Heparin level 0.3 - 0.4 per TCTS APTT 66-88 sec Monitor platelets by anticoagulation protocol: Yes    Plan:  Heparin gtt at 1000 units/hr, no bolus Check 8 hr aPTT, heparin level Daily heparin level, aPTT, CBC    Clarie Camey D. Mina Marble, PharmD, BCPS Pager:  [phone] 08/13/2017, 4:29 PM

## 2017-08-13 NOTE — Progress Notes (Signed)
[place] NOTE   Pharmacy Consult for TPN Indication: ileus  Patient Measurements: Height: 5\' 7"  (170.2 cm) Weight: 187 lb 2.7 oz (84.9 kg) IBW/kg (Calculated) : 66.1 TPN AdjBW (KG): 76.6 Body mass index is 29.32 kg/m.  Assessment: 83 yo m with CABG x 2 on 9/14 now with acute renal failure and post-op ileus per Dr Prescott Gum   PMH: CAD, HLD, PVD, AFib, CKD 3, HTN, diastolic HF  GI: abx xray 9/20 shows distension of right colon. LBM 9/12 Endo: CBgs < 140s Insulin requirements in the past 24 hours: 2 Lytes: K 3.6, Phos 4.7, Mg 2.1 Renal: ARF CRRT 9/20>> Pulm: bipap Cards: s/p cabg in afib on dopamin, neo, milrinone Hepatobil: LFTs WNL Neuro: no issues ID: no issues  TPN Access: PICC TPN start date: 9/20  Nutritional Goals (per RD recommendation on 9/18): KCal: 1800 - 2000 / day Protein: 105 - 115 g/day  Current Nutrition: NPO TPN  Plan:  Start Clinimix 5/15 (no electrolytes d/t CRRT at 30 ml/hr) Hold 20% lipid emulsion for first 7 days for ICU patients per ASPEN guidelines (Start date 9/27)  Daily MVI and trace elements in TPN Continue SSI q4 and adjust as needed Monitor TPN labs F/U resolution of ileus, crrt stop  Levester Fresh, PharmD, BCPS, BCCCP Clinical Pharmacist Clinical phone for 08/13/2017 from 7a-3:30p: [phone] If after 3:30p, please call main pharmacy at: [phone] 08/13/2017 10:28 AM

## 2017-08-13 NOTE — Progress Notes (Signed)
Dr. Cyndia Bent made aware of patients blood pressures throughout night. Due to pt to start CRRT morning of 9/20, Arterial line was ordered during the day 9/19. RT attempted to insert line two times overnight, but was unsuccessful. Due to SBPs anywhere from 70-100s with DBPs 30-50s. Order for Neo synephrine drip to be started to keep a SBP greater than 100. Will follow through with orders and continue to monitor.

## 2017-08-13 NOTE — Progress Notes (Signed)
      [address]       [place] [address]             [phone]    Resting quietly  BP 114/77   Pulse (!) 129   Temp 98.2 F (36.8 C)   Resp (!) 24   Ht 5\' 7"  (1.702 m)   Wt 187 lb 2.7 oz (84.9 kg)   SpO2 98%   BMI 29.32 kg/m    Intake/Output Summary (Last 24 hours) at 08/13/17 1948 Last data filed at 08/13/17 1900  Gross per 24 hour  Intake          1043.85 ml  Output             2266 ml  Net         -1222.15 ml   Pulling about 50 ml/ hr with CVVH  In atrial fib (chronic) with rates in 110-120 range PRN lopressor  Dopamine to be weaned off tonight  Antoria Lanza C. Roxan Hockey, MD [facility] [phone]

## 2017-08-13 NOTE — Anesthesia Procedure Notes (Signed)
Arterial Line Insertion Start/End9/20/2018 10:00 AM Performed by: Josephine Igo, CRNA  Patient location: ICU. Preanesthetic checklist: patient identified and IV checked Lidocaine 1% used for infiltration Left, radial was placed Catheter size: 20 G Hand hygiene performed  and maximum sterile barriers used   Attempts: 2 Procedure performed without using ultrasound guided technique. Following insertion, dressing applied and Biopatch. Post procedure assessment: normal  Additional procedure comments: Left radial Arterial line per VanTrigt MD..

## 2017-08-13 NOTE — Progress Notes (Addendum)
Nutrition Follow-up  DOCUMENTATION CODES:   Obesity unspecified  INTERVENTION:   TPN administration per Pharmacy   NUTRITION DIAGNOSIS:   Increased nutrient needs related to acute illness (s/p CABG 08/07/17) as evidenced by estimated needs. Ongoing.   GOAL:   Patient will meet greater than or equal to 90% of their needs Progressing.   MONITOR:   Diet advancement, I & O's  REASON FOR ASSESSMENT:   Consult New TPN/TNA  ASSESSMENT:   Pt with PMH of HLD, PVD, CAD, gout, BPH, GERD, TIA, CKD stage III, memory loss presented with chest pain found NSTEMI s/p CABG 08/07/17.   On BiPAP  9/18 pt made NPO after aspiration event 9/20 started on CRRT  Spoke with RN at bedside, reviewed xrays in room. Pt with ileus.  MAP 55-72 via A-line with neo Medications reviewed and include: duloclax, colace (no access), lasix, reglan IV  Labs reviewed: PO4 4.7 (H) CBG's: 132-440-10  9/20 TPN to start this evening  Clinimix (no electrolytes) 5/15 @ 30 ml/hr Provides: 720 ml, 511 kcal, and 36 grams protein  Diet Order:  Diet NPO time specified Except for: Sips with Meds, Ice Chips TPN (CLINIMIX) Adult without lytes  Skin:  Wound (see comment) (Incisions at chest, R leg, L knee)  Last BM:  08/05/17  Height:   Ht Readings from Last 1 Encounters:  08/07/17 5\' 7"  (1.702 m)    Weight:   Wt Readings from Last 1 Encounters:  08/13/17 187 lb 2.7 oz (84.9 kg)    Ideal Body Weight:  67 kg  BMI:  Body mass index is 29.32 kg/m.  Estimated Nutritional Needs:   Kcal:  1800-2000  Protein:  120-150 grams  Fluid:  >/= 1.8 L/d  EDUCATION NEEDS:   Education needs no appropriate at this time  [place], [place], [place] Pager [phone] After Hours Pager

## 2017-08-13 NOTE — Care Management Note (Signed)
Case Management Note Previous Note Created by Michail Jewels  Patient Details  Name: Stanley Robles MRN: [id number] Date of Birth: 1932-05-06  Subjective/Objective:  Pt presented for Chest Pain- s/p cardiac cath that revealed severe ostial left main disease as well as ostial circumflex with total right coronary artery disease. CVTS following to see if pt is a candidate for CABG. PT Recommendations for [facility] PT once stable.                  Action/Plan: CM will continue to monitor for disposition needs.   Expected Discharge Date:                  Expected Discharge Plan:  Skilled Nursing Facility  In-House Referral:  Clinical Social Work  Discharge planning Services  CM Consult  Post Acute Care Choice:    Choice offered to:     DME Arranged:    DME Agency:     HH Arranged:    [place] Agency:     Status of Service:  In process, will continue to follow  If discussed at Long Length of Stay Meetings, dates discussed:    Additional Comments: 08/13/2017 Pt is now s/p CABG.  SNF recommended - CSW to speak with family (daughter as requested regarding SNF as discharge plan).  CM will continue to follow for discharge needs Maryclare Labrador, RN 08/13/2017, 3:21 PM

## 2017-08-13 NOTE — Progress Notes (Signed)
RT Note-Attempted A-line times 2, unsuccessful. Good flow but unable to thread in completely. Dr. Prescott Gum called. Placed on Bipap at this time as well.

## 2017-08-13 NOTE — Progress Notes (Signed)
RT note- Patient removed from [place] and placed back to [place].

## 2017-08-13 NOTE — Progress Notes (Signed)
RT's  attempted to place arterial line multiple times with no success in the right arm as the left arm is restricted. RT informed RN and will reassess in the AM.

## 2017-08-14 ENCOUNTER — Inpatient Hospital Stay (HOSPITAL_COMMUNITY): Payer: Medicare Other

## 2017-08-14 LAB — RENAL FUNCTION PANEL
ANION GAP: 10 (ref 5–15)
Albumin: 2 g/dL — ABNORMAL LOW (ref 3.5–5.0)
Albumin: 2.3 g/dL — ABNORMAL LOW (ref 3.5–5.0)
Anion gap: 10 (ref 5–15)
BUN: 30 mg/dL — AB (ref 6–20)
BUN: 24 mg/dL — ABNORMAL HIGH (ref 6–20)
CALCIUM: 8.2 mg/dL — AB (ref 8.9–10.3)
CHLORIDE: 104 mmol/L (ref 101–111)
CO2: 23 mmol/L (ref 22–32)
CO2: 22 mmol/L (ref 22–32)
Calcium: 7.8 mg/dL — ABNORMAL LOW (ref 8.9–10.3)
Chloride: 104 mmol/L (ref 101–111)
Creatinine, Ser: 2.4 mg/dL — ABNORMAL HIGH (ref 0.61–1.24)
Creatinine, Ser: 1.92 mg/dL — ABNORMAL HIGH (ref 0.61–1.24)
GFR calc Af Amer: 27 mL/min — ABNORMAL LOW (ref >60)
GFR calc non Af Amer: 24 mL/min — ABNORMAL LOW (ref >60)
GFR calc non Af Amer: 31 mL/min — ABNORMAL LOW (ref >60)
GFR, EST AFRICAN AMERICAN: 36 mL/min — AB (ref >60)
GLUCOSE: 129 mg/dL — AB (ref 65–99)
Glucose, Bld: 127 mg/dL — ABNORMAL HIGH (ref 65–99)
PHOSPHORUS: 2.3 mg/dL — AB (ref 2.5–4.6)
POTASSIUM: 3.3 mmol/L — AB (ref 3.5–5.1)
Phosphorus: 2.2 mg/dL — ABNORMAL LOW (ref 2.5–4.6)
Potassium: 3.7 mmol/L (ref 3.5–5.1)
SODIUM: 136 mmol/L (ref 135–145)
Sodium: 137 mmol/L (ref 135–145)

## 2017-08-14 LAB — BLOOD GAS, ARTERIAL
Acid-Base Excess: 1 mmol/L (ref 0.0–2.0)
Bicarbonate: 23 mmol/L (ref 20.0–28.0)
O2 Content: 4 L/min
O2 Saturation: 98.6 %
Patient temperature: 98.6
pCO2 arterial: 24.7 mmHg — ABNORMAL LOW (ref 32.0–48.0)
pH, Arterial: 7.577 — ABNORMAL HIGH (ref 7.350–7.450)
pO2, Arterial: 122 mmHg — ABNORMAL HIGH (ref 83.0–108.0)

## 2017-08-14 LAB — HEPARIN LEVEL (UNFRACTIONATED)
Heparin Unfractionated: 0.4 IU/mL (ref 0.30–0.70)
Heparin Unfractionated: 0.53 IU/mL (ref 0.30–0.70)
Heparin Unfractionated: 0.33 IU/mL (ref 0.30–0.70)

## 2017-08-14 LAB — CBC WITH DIFFERENTIAL/PLATELET
BASOS ABS: 0 10*3/uL (ref 0.0–0.1)
Basophils Relative: 0 %
Eosinophils Absolute: 0.1 10*3/uL (ref 0.0–0.7)
Eosinophils Relative: 1 %
HEMATOCRIT: 26.9 % — AB (ref 39.0–52.0)
Hemoglobin: 9.2 g/dL — ABNORMAL LOW (ref 13.0–17.0)
LYMPHS ABS: 0.9 10*3/uL (ref 0.7–4.0)
LYMPHS PCT: 12 %
MCH: 33.5 pg (ref 26.0–34.0)
MCHC: 34.2 g/dL (ref 30.0–36.0)
MCV: 97.8 fL (ref 78.0–100.0)
MONO ABS: 0.6 10*3/uL (ref 0.1–1.0)
Monocytes Relative: 8 %
NEUTROS ABS: 6.2 10*3/uL (ref 1.7–7.7)
Neutrophils Relative %: 80 %
Platelets: 108 10*3/uL — ABNORMAL LOW (ref 150–400)
RBC: 2.75 MIL/uL — ABNORMAL LOW (ref 4.22–5.81)
RDW: 16.6 % — AB (ref 11.5–15.5)
WBC: 7.9 10*3/uL (ref 4.0–10.5)

## 2017-08-14 LAB — TRIGLYCERIDES: TRIGLYCERIDES: 69 mg/dL (ref <150)

## 2017-08-14 LAB — MAGNESIUM: Magnesium: 2.2 mg/dL (ref 1.7–2.4)

## 2017-08-14 LAB — GLUCOSE, CAPILLARY
GLUCOSE-CAPILLARY: 130 mg/dL — AB (ref 65–99)
Glucose-Capillary: 121 mg/dL — ABNORMAL HIGH (ref 65–99)
Glucose-Capillary: 108 mg/dL — ABNORMAL HIGH (ref 65–99)
Glucose-Capillary: 105 mg/dL — ABNORMAL HIGH (ref 65–99)

## 2017-08-14 LAB — APTT: aPTT: 134 seconds — ABNORMAL HIGH (ref 24–36)

## 2017-08-14 LAB — PREALBUMIN: Prealbumin: 7.1 mg/dL — ABNORMAL LOW (ref 18–38)

## 2017-08-14 LAB — COOXEMETRY PANEL
Carboxyhemoglobin: 1.3 % (ref 0.5–1.5)
Methemoglobin: 0.8 % (ref 0.0–1.5)
O2 Saturation: 46.6 %
Total hemoglobin: 9.9 g/dL — ABNORMAL LOW (ref 12.0–16.0)

## 2017-08-14 MED ORDER — CHLORHEXIDINE GLUCONATE 0.12 % MT SOLN
15.0000 mL | Freq: Two times a day (BID) | OROMUCOSAL | Status: DC
  Administered 2017-08-14 – 2017-08-22 (×15): 15 mL via OROMUCOSAL
  Filled 2017-08-14 (×9): qty 15

## 2017-08-14 MED ORDER — FENTANYL CITRATE (PF) 100 MCG/2ML IJ SOLN
12.5000 ug | INTRAMUSCULAR | Status: DC | PRN
  Administered 2017-08-14 – 2017-08-16 (×8): 12.5 ug via INTRAVENOUS
  Filled 2017-08-14 (×8): qty 2

## 2017-08-14 MED ORDER — POTASSIUM PHOSPHATES 15 MMOLE/5ML IV SOLN
10.0000 mmol | Freq: Once | INTRAVENOUS | Status: AC
  Administered 2017-08-14: 10 mmol via INTRAVENOUS
  Filled 2017-08-14: qty 3.33

## 2017-08-14 MED ORDER — POTASSIUM CHLORIDE 10 MEQ/50ML IV SOLN
10.0000 meq | Freq: Once | INTRAVENOUS | Status: AC
  Administered 2017-08-14: 10 meq via INTRAVENOUS
  Filled 2017-08-14: qty 50

## 2017-08-14 MED ORDER — METOPROLOL TARTRATE 5 MG/5ML IV SOLN
5.0000 mg | Freq: Four times a day (QID) | INTRAVENOUS | Status: DC
  Administered 2017-08-14 – 2017-08-18 (×11): 5 mg via INTRAVENOUS
  Filled 2017-08-14 (×12): qty 5

## 2017-08-14 MED ORDER — M.V.I. ADULT IV INJ
INJECTION | INTRAVENOUS | Status: AC
  Administered 2017-08-14: 18:00:00 via INTRAVENOUS
  Filled 2017-08-14: qty 960

## 2017-08-14 MED ORDER — INSULIN ASPART 100 UNIT/ML ~~LOC~~ SOLN
0.0000 [IU] | Freq: Four times a day (QID) | SUBCUTANEOUS | Status: DC
  Administered 2017-08-15 – 2017-08-16 (×2): 2 [IU] via SUBCUTANEOUS
  Administered 2017-08-16: 3 [IU] via SUBCUTANEOUS
  Administered 2017-08-17 – 2017-08-19 (×3): 2 [IU] via SUBCUTANEOUS

## 2017-08-14 NOTE — Progress Notes (Signed)
Physical Therapy Treatment Patient Details Name: Stanley Robles MRN: [id number] DOB: 1932/05/24 Today's Date: 08/14/2017    History of Present Illness 83 yo admitted with NSTEMI s/p cath which showed severe left main disease and total right coronary artery stenosis, s/p CABG 08/08/17. experienced acute renal failure Placed on CRRT 08/12/17 PMhx:  CVA, sensorineural hearing loss, HLD, PVD, pAF, CKD stage 3, HTN, NSTEMI, chronic diastolic heart failure    PT Comments    Pt is motivated to get out of bed today. Pt limited by his connection to CRRT, however with help from nursing staff able to transfer from bed to recliner. Pt currently maxAx2 for bed mobility and transfers. Pt requires skilled PT to continue to progress mobility and improve strength and endurance.   Follow Up Recommendations  SNF     Equipment Recommendations  Other (comment) (to be determined at next venue)       Precautions / Restrictions Precautions Precautions: Fall;Sternal Restrictions Weight Bearing Restrictions: Yes (sternal precautions)    Mobility  Bed Mobility Overal bed mobility: Needs Assistance Bed Mobility: Supine to Sit     Supine to sit: Max assist;+2 for physical assistance     General bed mobility comments: maxAx2 for trunk to upright and hips to EoB with pad scoot, pt able to move LE to EoB   Transfers     Transfers: Sit to/from Stand Sit to Stand: Mod assist;+2 physical assistance         General transfer comment: modA for power up maintaining sternal precautions    Balance Overall balance assessment: Needs assistance Sitting-balance support: Feet supported;No upper extremity supported Sitting balance-Leahy Scale: Poor Sitting balance - Comments: Pt required minA to maintain seated balance    Standing balance support: Bilateral upper extremity supported Standing balance-Leahy Scale: Poor Standing balance comment: requires modAx2 to maintain standing                             Cognition Arousal/Alertness: Awake/alert Behavior During Therapy: WFL for tasks assessed/performed Overall Cognitive Status: Within Functional Limits for tasks assessed                                           General Comments General comments (skin integrity, edema, etc.): prior to activity BP 136/61, HR 81 bpm, RR14, SaO2 on 3L O2 via nasal cannula, after sitting in recliner BP 141/110, HR 90, RR 18, SaO2 100%O2      Pertinent Vitals/Pain Pain Assessment: Faces Faces Pain Scale: Hurts little more Pain Location: sternum Pain Descriptors / Indicators: Constant;Aching Pain Intervention(s): Monitored during session;Limited activity within patient's tolerance           PT Goals (current goals can now be found in the care plan section) Acute Rehab PT Goals PT Goal Formulation: With patient Time For Goal Achievement: 08/18/17 Potential to Achieve Goals: Fair Progress towards PT goals: Progressing toward goals    Frequency    Min 3X/week      PT Plan Current plan remains appropriate       AM-PAC PT "6 Clicks" Daily Activity  Outcome Measure  Difficulty turning over in bed (including adjusting bedclothes, sheets and blankets)?: Unable Difficulty moving from lying on back to sitting on the side of the bed? : Unable Difficulty sitting down on and standing up from a chair with arms (e.g., wheelchair,  bedside commode, etc,.)?: Unable Help needed moving to and from a bed to chair (including a wheelchair)?: A Lot Help needed walking in hospital room?: Total   6 Click Score: 6    End of Session Equipment Utilized During Treatment: Gait belt;Oxygen Activity Tolerance: Patient limited by fatigue Patient left: in chair;with call bell/phone within reach;with chair alarm set;with nursing/sitter in room Nurse Communication: Mobility status PT Visit Diagnosis: Difficulty in walking, not elsewhere classified (R26.2);Muscle weakness (generalized) (M62.81)      Time: 1696-7893 PT Time Calculation (min) (ACUTE ONLY): 20 min  Charges:  $Therapeutic Activity: 8-22 mins                    G Codes:       Facundo Allemand B. Migdalia Dk PT, DPT Acute Rehabilitation  [phone] Pager [phone]     [place] 08/14/2017, 12:27 PM

## 2017-08-14 NOTE — Procedures (Signed)
Admit: 07/31/2017 LOS: 29  80M wih AoCKD3 s/p 2V CABG 9/14, aspiration events 9/18  Current CRRT Prescription: Start Date: 08/13/17 Catheter: L [place] Temp HD cath placed TCTS 08/12/17 BFR: 200 Pre Blood Pump: 500 4K DFR: 1000 4K Replacement Rate: 300 4K Goal UF: 30mL net neg/hr Anticoagulation: systemic heparin Clotting: none since starting    S: Started systemi heparin for AFib Off dopamine and PE Cont on milrinone On TPN Good UOP yesterday not on fuosemide On [place] 4L Less edema on exam   O: 09/20 0701 - 09/21 0700 In: 1168.9 [I.V.:918.9; IV Piggyback:250] Out: 2816 [YQIHK:7425]  Filed Weights   08/12/17 0500 08/13/17 0500 08/14/17 0430  Weight: 89 kg (196 lb 3.4 oz) 84.9 kg (187 lb 2.7 oz) 84.6 kg (186 lb 8.2 oz)     Recent Labs Lab 08/13/17 0223 08/13/17 1527 08/13/17 1534 08/14/17 0259  NA 135 137 138 137  K 3.6 3.3* 3.4* 3.3*  CL 99* 101 102 104  CO2 21* 21*  --  23  GLUCOSE 132* 100* 100* 129*  BUN 55* 46* 40* 30*  CREATININE 4.45* 3.62* 3.50* 2.40*  CALCIUM 8.0* 8.1*  --  7.8*  PHOS 4.7* 3.8  --  2.2*    Recent Labs Lab 08/10/17 0846 08/10/17 1559  08/12/17 0346  08/13/17 0223 08/13/17 1534 08/14/17 0259  WBC 13.6* 15.5*  < > 9.0  --  9.8  --  7.9  NEUTROABS 11.9* 13.4*  --   --   --   --   --  6.2  HGB 9.0* 9.3*  < > 8.5*  < > 9.1* 8.8* 9.2*  HCT 25.9* 27.5*  < > 24.6*  < > 26.0* 26.0* 26.9*  MCV 98.9 99.3  < > 98.0  --  96.7  --  97.8  PLT 120* 134*  < > 129*  --  129*  --  108*  < > = values in this interval not displayed.  Scheduled Meds: . allopurinol  200 mg Oral Daily  . aspirin EC  325 mg Oral Daily   Or  . aspirin  324 mg Per Tube Daily  . bisacodyl  10 mg Oral Daily   Or  . bisacodyl  10 mg Rectal Daily  . chlorhexidine  15 mL Mouth Rinse BID  . Chlorhexidine Gluconate Cloth  6 each Topical Daily  . docusate sodium  200 mg Oral Daily  . finasteride  5 mg Oral Daily  . insulin aspart  0-24 Units Subcutaneous Q4H  .  levalbuterol  1.25 mg Nebulization TID  . mouth rinse  15 mL Mouth Rinse q12n4p  . metoCLOPramide (REGLAN) injection  10 mg Intravenous Q6H  . metoprolol tartrate  12.5 mg Oral BID   Or  . metoprolol tartrate  12.5 mg Per Tube BID  . pantoprazole (PROTONIX) IV  40 mg Intravenous Q24H  . rosuvastatin  10 mg Oral q1800  . sodium chloride flush  10-40 mL Intracatheter Q12H  . sodium chloride flush  10-40 mL Intracatheter Q12H  . sodium chloride flush  3 mL Intravenous Q12H   Continuous Infusions: . sodium chloride Stopped (08/08/17 0900)  . sodium chloride    . sodium chloride 10 mL/hr at 08/14/17 0800  . sodium chloride    . sodium chloride    . heparin 850 Units/hr (08/14/17 0800)  . lactated ringers 10 mL/hr at 08/10/17 0800  . milrinone 0.125 mcg/kg/min (08/14/17 0800)  . phenylephrine (NEO-SYNEPHRINE) Adult infusion Stopped (08/13/17 1300)  .  piperacillin-tazobactam (ZOSYN)  IV Stopped (08/14/17 0540)  . dialysis replacement fluid (prismasate) 500 mL/hr at 08/14/17 0755  . dialysis replacement fluid (prismasate) 300 mL/hr at 08/14/17 0425  . dialysate (PRISMASATE) 1,000 mL/hr at 08/14/17 0834  . TPN (CLINIMIX) Adult without lytes 30 mL/hr at 08/14/17 0800   PRN Meds:.sodium chloride, Place/Maintain arterial line **AND** sodium chloride, Place/Maintain arterial line **AND** sodium chloride, heparin, heparin, metoprolol tartrate, ondansetron (ZOFRAN) IV, oxyCODONE, RESOURCE THICKENUP CLEAR, sodium chloride flush, sodium chloride flush, sodium chloride flush, traMADol  ABG    Component Value Date/Time   PHART 7.577 (H) 08/14/2017 0350   PCO2ART 24.7 (L) 08/14/2017 0350   PO2ART 122 (H) 08/14/2017 0350   HCO3 23.0 08/14/2017 0350   TCO2 22 08/13/2017 1534   ACIDBASEDEF 2.8 (H) 08/13/2017 0430   O2SAT 46.6 08/14/2017 0350   O2SAT 98.6 08/14/2017 0350    A/P  1. Dialysis dependent AoCKD3, nonoliguric, baseline SCr in 1.5-2.0; likely ATN post CABG 2. CAD s/p 2V CABG 08/07/17;  rmeains on milrinone  3. BPH on outpt finasteride, foley present currently 4. AFib with RVR, on amio / heparin systemic 5. Chronic mild TCP, seen by [place], ?ITP  P  Cont CRRT, 64mL net neg, all 4k, no circuit heparin, 500/1000/300 flow.    If good UOP again tomorrow look to weening off CRRT as I suspect is recovering from AKI  Cont to Hold diuretics  Pearson Grippe, MD [employer] pgr [phone]

## 2017-08-14 NOTE — Progress Notes (Signed)
Subjective:  Looks much better today.  I spoke with Dr. Nils Pyle yesterday and agreed to d/c dompamine that helped rapid a fib. Pt. Sitting up in chair at bedside. Still hoarse  Objective:  Vital Signs in the last 24 hours: BP (!) 148/61   Pulse 88   Temp 98.4 F (36.9 C)   Resp (!) 21   Ht 5\' 7"  (1.702 m)   Wt 84.6 kg (186 lb 8.2 oz)   SpO2 100%   BMI 29.21 kg/m   Physical Exam: Elderly WM in NAD, HOH Lungs:  Reduced BS Cardiac:  irregular rhythm, normal S1 and S2, no S3 Extremities:  1+ edema present  Intake/Output from previous day: 09/20 0701 - 09/21 0700 In: 1168.9 [I.V.:918.9; IV Piggyback:250] Out: 2816 [Urine:1835] Weight Filed Weights   08/12/17 0500 08/13/17 0500 08/14/17 0430  Weight: 89 kg (196 lb 3.4 oz) 84.9 kg (187 lb 2.7 oz) 84.6 kg (186 lb 8.2 oz)    Lab Results: Basic Metabolic Panel:  Recent Labs  08/13/17 1527 08/13/17 1534 08/14/17 0259  NA 137 138 137  K 3.3* 3.4* 3.3*  CL 101 102 104  CO2 21*  --  23  GLUCOSE 100* 100* 129*  BUN 46* 40* 30*  CREATININE 3.62* 3.50* 2.40*    CBC:  Recent Labs  08/13/17 0223 08/13/17 1534 08/14/17 0259  WBC 9.8  --  7.9  NEUTROABS  --   --  6.2  HGB 9.1* 8.8* 9.2*  HCT 26.0* 26.0* 26.9*  MCV 96.7  --  97.8  PLT 129*  --  108*    BNP    Component Value Date/Time   BNP 560.8 (H) 08/01/2017 0057    Telemetry: Has intermittent sinus rhythm since yesterday and today but currently in atrial fibrillation with controlled response.   Assessment/Plan:  1. Recent CABG 2. ARF on top of chronic but some UOP now 3. Prior stroke and dysphagia 4. Atrial fibrillation  Intermittent sinus noted.   He DOES NOT HAVE  chronic atrial fib but paroxysmal.   I would like to start low dose beta blocker if OK with cardiac surgery.   Metoprolol 12.5 BID.        Kerry Hough  MD [facility]  08/14/2017, 1:40 PM

## 2017-08-14 NOTE — Progress Notes (Signed)
Nephrology notified due to patient decreased urinary output. Dr. Joelyn Oms aware. No new orders given, will continue to monitor.

## 2017-08-14 NOTE — Progress Notes (Signed)
ANTICOAGULATION CONSULT NOTE  Pharmacy Consult:  Heparin Indication: atrial fibrillation  Allergies  Allergen Reactions  . Lipitor [Atorvastatin] Other (See Comments)    Severe constipation    Patient Measurements: Height: 5\' 7"  (170.2 cm) Weight: 186 lb 8.2 oz (84.6 kg) IBW/kg (Calculated) : 66.1 Heparin Dosing Weight: 83 kg  Vital Signs: Temp: 98.8 F (37.1 C) (09/21 1500) Temp Source: Core (Comment) (09/21 1200) BP: 106/43 (09/21 1500) Pulse Rate: 99 (09/21 1500)  Labs:  Recent Labs  08/12/17 0346  08/13/17 0223  08/13/17 1534 08/14/17 0020 08/14/17 0259 08/14/17 1442  HGB 8.5*  < > 9.1*  --  8.8*  --  9.2*  --   HCT 24.6*  < > 26.0*  --  26.0*  --  26.9*  --   PLT 129*  --  129*  --   --   --  108*  --   APTT  --   --   --   --   --  134*  --   --   HEPARINUNFRC  --   --   --   --   --  0.40 0.53 0.33  CREATININE 3.62*  < > 4.45*  < > 3.50*  --  2.40* 1.92*  < > = values in this interval not displayed.  Estimated Creatinine Clearance: 30.8 mL/min (A) (by C-G formula based on SCr of 1.92 mg/dL (H)).     Assessment: 76 YOM with history of PAF on Eliquis PTA, last dose 07/31/17 at 1800.  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  CBC stable and negative HIT Ab and SRA, therefore, heparin bridge started 08/13/17.  Heparin level is therapeutic; no bleeding documented.     Goal of Therapy:  Heparin level 0.3 - 0.4 per TCTS APTT 66-88 sec Monitor platelets by anticoagulation protocol: Yes    Plan:  Continue heparin gtt at 850 units/hr F/U AM labs   Dion Parrow D. Mina Marble, PharmD, BCPS Pager:  [phone] 08/14/2017, 3:36 PM

## 2017-08-14 NOTE — Progress Notes (Signed)
[place] NOTE   Pharmacy Consult for TPN Indication: ileus  Patient Measurements: Height: 5\' 7"  (170.2 cm) Weight: 186 lb 8.2 oz (84.6 kg) IBW/kg (Calculated) : 66.1 TPN AdjBW (KG): 76.6 Body mass index is 29.21 kg/m.  Assessment: 83 yo M with CABG x 2 on 9/14 now with acute renal failure and post-op ileus per Dr Prescott Gum.  PMH: CAD, HLD, PVD, AFib, CKD 3, HTN, diastolic HF  GI: Abdominal Xray on 9/21 does not suggest bowel dilatation. Had BM on 9/20. Albumin low at 2.0. Prelabumin low at 7.1. Endo: No hx of DM. CBGs < 140s. SSI Insulin requirements in the past 24 hours: 4 Lytes: wnl exc K low at 3.3. CoCa 9.3. Phos down to 2.2, Mg ok. Renal: ARF. Continues on CRRT 9/20 >>. Using all 4K baths. Pulm: bipap Cards: S/p cabg in afib on dopamin, milrinone. Neo gtt weaned off. Plan to wean off dopamine today Hepatobil: LFTs WNL. Tbili elevated at 1.4. Trig wnl Neuro: no issues ID: no issues  TPN Access: PICC TPN start date: 9/20  Nutritional Goals (per RD recommendation on 9/20): KCal: 1800 - 2000 / day Protein: 120 - 150 g / day  Current Nutrition: NPO, TPN  Plan:  Increase Clinimix 5/15 (no lytes) to 40 ml/hr Hold 20% lipid emulsion for first 7 days for ICU patients per ASPEN guidelines (Start date 9/27) Continue NS at 44ml/hr per MD Continue LR at 57ml/hr per MD Will advance TPN to goal as tolerated but may be able to trial TFs soon Add MVI and trace elements in TPN Decrease resistant SSI to Q6h and adjust as needed Monitor TPN labs, renal function panel F/U trial of TFs?  Give 75mmol of KPhos x 1 today  Abdominal imaging shows no signs of ileus or obstruction, had BM yesterday, consider trial of TFs soon and wean off TPN?  Discussed potassium with Nephrology and will hold off on K replacement for now  Elenor Quinones, PharmD, [facility] Clinical Pharmacist Pager [phone] 08/14/2017 8:55 AM

## 2017-08-14 NOTE — Progress Notes (Signed)
7 Days Post-Op Procedure(s) (LRB): CORONARY ARTERY BYPASS GRAFTING (CABG) x 2 WITH ENDOSCOPIC HARVESTING OF RIGHT SAPHENOUS VEIN. 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 Patient has been managed with intermittent BiPAP, TPN started for nutrition, IV Zosyn for aspiration pneumonia and IV heparin for postop PAF [Patient was on Eliquis was preop]  Objective: Vital signs in last 24 hours: Temp:  [98.2 F (36.8 C)-99 F (37.2 C)] 98.6 F (37 C) (09/21 1700) Pulse Rate:  [36-129] 123 (09/21 1700) Cardiac Rhythm: Atrial fibrillation;Normal sinus rhythm (09/21 1600) Resp:  [11-28] 22 (09/21 1700) BP: (80-154)/(37-80) 105/73 (09/21 1700) SpO2:  [94 %-100 %] 100 % (09/21 1700) Arterial Line BP: (104-202)/(31-64) 149/43 (09/21 1700) Weight:  [186 lb 8.2 oz (84.6 kg)] 186 lb 8.2 oz (84.6 kg) (09/21 0430)  Hemodynamic parameters for last 24 hours: CVP:  [4 mmHg-13 mmHg] 13 mmHg  Intake/Output from previous day: 09/20 0701 - 09/21 0700 In: 1168.9 [I.V.:918.9; IV Piggyback:250] Out: 2816 [Urine:1835] Intake/Output this shift: Total I/O In: 907 [I.V.:505; IV Piggyback:402] Out: 1539 [Urine:138; Other:1401]  Alert but weak with strong wet cough Scattered rhonchi, coarse breath sounds Heart rate irregular in atrial fibrillation without murmur Abdomen nontender after bowel movement Extremities warm No focal neuro deficit generally weak  Lab Results:  Recent Labs  08/13/17 0223 08/13/17 1534 08/14/17 0259  WBC 9.8  --  7.9  HGB 9.1* 8.8* 9.2*  HCT 26.0* 26.0* 26.9*  PLT 129*  --   108*   BMET:  Recent Labs  08/14/17 0259 08/14/17 1442  NA 137 136  K 3.3* 3.7  CL 104 104  CO2 23 22  GLUCOSE 129* 127*  BUN 30* 24*  CREATININE 2.40* 1.92*  CALCIUM 7.8* 8.2*    PT/INR: No results for input(s): LABPROT, INR in the last 72 hours. ABG    Component Value Date/Time   PHART 7.577 (H) 08/14/2017 0350   HCO3 23.0 08/14/2017 0350   TCO2 22 08/13/2017 1534   ACIDBASEDEF 2.8 (H) 08/13/2017 0430   O2SAT 46.6 08/14/2017 0350   O2SAT 98.6 08/14/2017 0350   CBG (last 3)   Recent Labs  08/14/17 0415 08/14/17 0829 08/14/17 1213  GLUCAP 130* 121* 108*    Assessment/Plan: S/P Procedure(s) (LRB): CORONARY ARTERY BYPASS GRAFTING (CABG) x 2 WITH ENDOSCOPIC HARVESTING OF RIGHT SAPHENOUS VEIN. (N/A) TRANSESOPHAGEAL ECHOCARDIOGRAM (TEE) (N/A) Plan daily mobilization to chair since patient has dialysis catheter in left subclavian vein Short-term TPN until patient can swallow safely-tube feeds would be with high risk for further aspiration with tenuous pulmonary status Continue heparin for possible DC cardioversion next week Low-dose milrinone for chronic heart failure   LOS: 14 days    Tharon Aquas Trigt III 08/14/2017

## 2017-08-14 NOTE — Progress Notes (Addendum)
ANTICOAGULATION CONSULT NOTE - Follow Up Consult  Pharmacy Consult for heparin Indication: atrial fibrillation  Labs:  Recent Labs  08/12/17 0346  08/13/17 0223 08/13/17 1527 08/13/17 1534 08/14/17 0020 08/14/17 0259  HGB 8.5*  < > 9.1*  --  8.8*  --  9.2*  HCT 24.6*  < > 26.0*  --  26.0*  --  26.9*  PLT 129*  --  129*  --   --   --  PENDING  APTT  --   --   --   --   --  134*  --   HEPARINUNFRC  --   --   --   --   --  0.40 0.53  CREATININE 3.62*  < > 4.45* 3.62* 3.50*  --  2.40*  < > = values in this interval not displayed.   Assessment/Plan:  83yo male therapeutic on heparin with initial dosing for Afib. Will continue gtt at current rate and confirm stable with additional level.   Wynona Neat, PharmD, BCPS  08/14/2017,1:46 AM   ADDENDUM: Follow-up heparin level now above goal. Will decrease heparin gtt by 2 units/kg/hr to 850 units/hr and check level in 8hr.    VB 5:54 AM

## 2017-08-14 NOTE — Progress Notes (Signed)
TCTS BRIEF SICU PROGRESS NOTE  7 Days Post-Op  S/P Procedure(s) (LRB): CORONARY ARTERY BYPASS GRAFTING (CABG) x 2 WITH ENDOSCOPIC HARVESTING OF RIGHT SAPHENOUS VEIN. (N/A) TRANSESOPHAGEAL ECHOCARDIOGRAM (TEE) (N/A)   Stable day AF w/ controlled rate and stable BP on milrinone Breathing comfortably w/ O2 sats 97-100% on 3 L/min Tolerating taking 50 mL/hr off via CVVHD  Plan: Continue current plan  Rexene Alberts, MD 08/14/2017 9:02 PM

## 2017-08-14 NOTE — Progress Notes (Signed)
SLP Cancellation Note  Patient Details Name: Stanley Robles MRN: [id number] DOB: 22-Feb-1932   Cancelled treatment:        Pt continues to be NPO due to ileus. Will follow up next week.   Houston Siren 08/14/2017, 1:08 PM   [name] [name] [employer] [phone]

## 2017-08-15 ENCOUNTER — Inpatient Hospital Stay (HOSPITAL_COMMUNITY): Payer: Medicare Other

## 2017-08-15 DIAGNOSIS — N179 Acute kidney failure, unspecified: Secondary | ICD-10-CM

## 2017-08-15 DIAGNOSIS — R570 Cardiogenic shock: Secondary | ICD-10-CM

## 2017-08-15 DIAGNOSIS — I480 Paroxysmal atrial fibrillation: Secondary | ICD-10-CM

## 2017-08-15 LAB — COOXEMETRY PANEL
CARBOXYHEMOGLOBIN: 1.3 % (ref 0.5–1.5)
Carboxyhemoglobin: 1.3 % (ref 0.5–1.5)
METHEMOGLOBIN: 0.8 % (ref 0.0–1.5)
Methemoglobin: 1 % (ref 0.0–1.5)
O2 SAT: 45.6 %
O2 Saturation: 40.8 %
TOTAL HEMOGLOBIN: 11.6 g/dL — AB (ref 12.0–16.0)
Total hemoglobin: 8 g/dL — ABNORMAL LOW (ref 12.0–16.0)

## 2017-08-15 LAB — COMPREHENSIVE METABOLIC PANEL
ALT: 22 U/L (ref 17–63)
AST: 26 U/L (ref 15–41)
Albumin: 2.2 g/dL — ABNORMAL LOW (ref 3.5–5.0)
Alkaline Phosphatase: 60 U/L (ref 38–126)
Anion gap: 7 (ref 5–15)
BUN: 18 mg/dL (ref 6–20)
CO2: 24 mmol/L (ref 22–32)
Calcium: 8 mg/dL — ABNORMAL LOW (ref 8.9–10.3)
Chloride: 104 mmol/L (ref 101–111)
Creatinine, Ser: 1.53 mg/dL — ABNORMAL HIGH (ref 0.61–1.24)
GFR calc Af Amer: 47 mL/min — ABNORMAL LOW (ref >60)
GFR calc non Af Amer: 41 mL/min — ABNORMAL LOW (ref >60)
Glucose, Bld: 116 mg/dL — ABNORMAL HIGH (ref 65–99)
Potassium: 3.7 mmol/L (ref 3.5–5.1)
Sodium: 135 mmol/L (ref 135–145)
Total Bilirubin: 1.2 mg/dL (ref 0.3–1.2)
Total Protein: 5.1 g/dL — ABNORMAL LOW (ref 6.5–8.1)

## 2017-08-15 LAB — GLUCOSE, CAPILLARY
GLUCOSE-CAPILLARY: 116 mg/dL — AB (ref 65–99)
GLUCOSE-CAPILLARY: 138 mg/dL — AB (ref 65–99)
Glucose-Capillary: 119 mg/dL — ABNORMAL HIGH (ref 65–99)
Glucose-Capillary: 95 mg/dL (ref 65–99)

## 2017-08-15 LAB — PHOSPHORUS: Phosphorus: 1.4 mg/dL — ABNORMAL LOW (ref 2.5–4.6)

## 2017-08-15 LAB — BLOOD GAS, ARTERIAL
Acid-Base Excess: 2.3 mmol/L — ABNORMAL HIGH (ref 0.0–2.0)
Bicarbonate: 25.5 mmol/L (ref 20.0–28.0)
O2 Content: 3 L/min
O2 Saturation: 93.9 %
Patient temperature: 98.6
pCO2 arterial: 33.6 mmHg (ref 32.0–48.0)
pH, Arterial: 7.492 — ABNORMAL HIGH (ref 7.350–7.450)
pO2, Arterial: 73.1 mmHg — ABNORMAL LOW (ref 83.0–108.0)

## 2017-08-15 LAB — HEPARIN LEVEL (UNFRACTIONATED)
Heparin Unfractionated: 0.21 IU/mL — ABNORMAL LOW (ref 0.30–0.70)
Heparin Unfractionated: 0.14 IU/mL — ABNORMAL LOW (ref 0.30–0.70)
Heparin Unfractionated: 0.26 IU/mL — ABNORMAL LOW (ref 0.30–0.70)

## 2017-08-15 LAB — RENAL FUNCTION PANEL
ALBUMIN: 2.3 g/dL — AB (ref 3.5–5.0)
ANION GAP: 8 (ref 5–15)
BUN: 17 mg/dL (ref 6–20)
CALCIUM: 8 mg/dL — AB (ref 8.9–10.3)
CO2: 24 mmol/L (ref 22–32)
Chloride: 104 mmol/L (ref 101–111)
Creatinine, Ser: 1.38 mg/dL — ABNORMAL HIGH (ref 0.61–1.24)
GFR calc Af Amer: 53 mL/min — ABNORMAL LOW (ref >60)
GFR, EST NON AFRICAN AMERICAN: 46 mL/min — AB (ref >60)
GLUCOSE: 122 mg/dL — AB (ref 65–99)
PHOSPHORUS: 2.6 mg/dL (ref 2.5–4.6)
Potassium: 3.4 mmol/L — ABNORMAL LOW (ref 3.5–5.1)
SODIUM: 136 mmol/L (ref 135–145)

## 2017-08-15 LAB — MAGNESIUM: Magnesium: 2.3 mg/dL (ref 1.7–2.4)

## 2017-08-15 MED ORDER — WHITE PETROLATUM GEL
Status: DC | PRN
  Filled 2017-08-15: qty 1

## 2017-08-15 MED ORDER — LEVALBUTEROL HCL 1.25 MG/0.5ML IN NEBU: 1.2500 mg | INHALATION_SOLUTION | Freq: Four times a day (QID) | RESPIRATORY_TRACT | Status: DC | PRN

## 2017-08-15 MED ORDER — SODIUM PHOSPHATES 45 MMOLE/15ML IV SOLN
30.0000 mmol | Freq: Once | INTRAVENOUS | Status: AC
  Administered 2017-08-15: 30 mmol via INTRAVENOUS
  Filled 2017-08-15: qty 10

## 2017-08-15 MED ORDER — TRACE MINERALS CR-CU-MN-SE-ZN 10-1000-500-60 MCG/ML IV SOLN
INTRAVENOUS | Status: AC
  Administered 2017-08-15: 18:00:00 via INTRAVENOUS
  Filled 2017-08-15: qty 1440

## 2017-08-15 MED ORDER — POTASSIUM CHLORIDE 10 MEQ/50ML IV SOLN
10.0000 meq | INTRAVENOUS | Status: AC
  Administered 2017-08-15 (×4): 10 meq via INTRAVENOUS
  Filled 2017-08-15 (×4): qty 50

## 2017-08-15 MED ORDER — AMIODARONE HCL IN DEXTROSE 360-4.14 MG/200ML-% IV SOLN
60.0000 mg/h | INTRAVENOUS | Status: AC
  Administered 2017-08-15 (×2): 60 mg/h via INTRAVENOUS
  Filled 2017-08-15 (×2): qty 200

## 2017-08-15 MED ORDER — AMIODARONE LOAD VIA INFUSION
150.0000 mg | Freq: Once | INTRAVENOUS | Status: AC
  Administered 2017-08-15: 150 mg via INTRAVENOUS
  Filled 2017-08-15: qty 83.34

## 2017-08-15 MED ORDER — AMIODARONE HCL IN DEXTROSE 360-4.14 MG/200ML-% IV SOLN
30.0000 mg/h | INTRAVENOUS | Status: DC
  Administered 2017-08-15 – 2017-08-21 (×11): 30 mg/h via INTRAVENOUS
  Filled 2017-08-15 (×12): qty 200

## 2017-08-15 MED ORDER — POTASSIUM PHOSPHATES 15 MMOLE/5ML IV SOLN
25.0000 mmol | Freq: Once | INTRAVENOUS | Status: DC
  Filled 2017-08-15: qty 8.33

## 2017-08-15 NOTE — Progress Notes (Addendum)
[place] NOTE   Pharmacy Consult for TPN Indication: Ileus  Patient Measurements: Height: 5\' 7"  (170.2 cm) Weight: 182 lb 15.7 oz (83 kg) IBW/kg (Calculated) : 66.1 TPN AdjBW (KG): 76.6 Body mass index is 28.66 kg/m.  Assessment: 83 yo M with CABG x 2 on 9/14 now with acute renal failure and post-op ileus per Dr Prescott Gum.  PMH: CAD, HLD, PVD, AFib, CKD 3, HTN, diastolic HF  GI: Abdominal Xray on 9/21 does not suggest bowel dilatation. Had BM on 9/20 and 9/21. Albumin low at 2.0. Prelabumin low at 7.1. Ileus seems to be resolved, CVTS aware and would like patient to be able to swallow safely before starting TFs to reduce the risk of aspiration with tenuous pulmonary status. Although speech did not see due to ileus. Endo: No hx of DM. CBGs < 140s. SSI Insulin requirements in the past 24 hours: 2 Lytes: wnl, K up to 3.7. Phos down to 1.4 today even after 37mmol of replacement yesterday. CoCa 9.3. Mg ok Renal: ARF. Continues on CRRT 9/20 >>. Using all 4K baths. Making a little bit of urine Pulm: bipap Cards: S/p cabg in afib on milrinone. BP and HR ok. Neo and dopamine weaned off. Hepatobil: LFTs WNL. Tbili ok at 1.2. Trig wnl Neuro: no issues ID: [email or web address] for possible PNA  TPN Access: PICC TPN start date: 9/20  Nutritional Goals (per RD recommendation on 9/20): KCal: 1800 - 2000 / day Protein: 120 - 150 g / day  Current Nutrition: NPO, TPN  Plan:  Increase Clinimix 5/15 (no lytes) to 60 ml/hr Hold 20% lipid emulsion for first 7 days for ICU patients per ASPEN guidelines (Start date 9/27) Continue NS at 43ml/hr per MD Continue LR at 46ml/hr per MD Will advance TPN to goal as tolerated but may be able to trial TFs soon Add MVI and trace elements in TPN Continue resistant SSI Q6h and adjust as needed Monitor TPN labs, renal function panel F/U ability to trial tube feeds per CVTS  Give 7mmol of NaPhos x 1 today   Elenor Quinones,  PharmD, [facility] Clinical Pharmacist Pager [phone] 08/15/2017 7:21 AM

## 2017-08-15 NOTE — Progress Notes (Signed)
ANTICOAGULATION CONSULT NOTE  Pharmacy Consult:  Heparin Indication: atrial fibrillation  Allergies  Allergen Reactions  . Lipitor [Atorvastatin] Other (See Comments)    Severe constipation    Patient Measurements: Height: 5\' 7"  (170.2 cm) Weight: 182 lb 15.7 oz (83 kg) IBW/kg (Calculated) : 66.1 Heparin Dosing Weight: 83 kg  Vital Signs: Temp: 96.6 F (35.9 C) (09/22 2008) Temp Source: Axillary (09/22 2008) BP: 140/68 (09/22 2100) Pulse Rate: 99 (09/22 2000)  Labs:  Recent Labs  08/13/17 0223  08/13/17 1534  08/14/17 0020 08/14/17 0259 08/14/17 1442 08/15/17 0350 08/15/17 1413 08/15/17 1740 08/15/17 2007  HGB 9.1*  --  8.8*  --   --  9.2*  --   --   --   --   --   HCT 26.0*  --  26.0*  --   --  26.9*  --   --   --   --   --   PLT 129*  --   --   --   --  108*  --   --   --   --   --   APTT  --   --   --   --  134*  --   --   --   --   --   --   HEPARINUNFRC  --   --   --   < > 0.40 0.53 0.33 0.21* 0.14*  --  0.26*  CREATININE 4.45*  < > 3.50*  --   --  2.40* 1.92* 1.53*  --  1.38*  --   < > = values in this interval not displayed.  Estimated Creatinine Clearance: 42.6 mL/min (A) (by C-G formula based on SCr of 1.38 mg/dL (H)).  Assessment: 43 YOM with history of PAF on Eliquis PTA, last dose 07/31/17 at 1800.  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 CBC stable and negative HIT Ab and SRA, therefore, heparin bridge started 08/13/17.   Heparin level slightly below goal: 0.26 on drip rate 1100 units/hr. No bleeding or infusion issues per RN     Goal of Therapy:  Heparin level 0.3 - 0.4 per TCTS APTT 66-88 sec Monitor platelets by anticoagulation protocol: Yes   Plan:  Increase heparin gtt to 1200 units/hr  Heparin level in 6 hrs Daily heparin level and CBC Monitor for s/s bleeding    Georga Bora, PharmD Clinical Pharmacist 08/15/2017 10:10 PM

## 2017-08-15 NOTE — Plan of Care (Signed)
Problem: Bowel/Gastric: Goal: Gastrointestinal status for postoperative course will improve Outcome: Progressing Pt post ileus, now having bowel movements. TNA will be continued until speech evaluation completed per MD orders.

## 2017-08-15 NOTE — Progress Notes (Signed)
Potassium level is 3.4, Dr. Jonnie Finner paged and orders for IV potassium received.

## 2017-08-15 NOTE — Progress Notes (Signed)
ANTICOAGULATION CONSULT NOTE  Pharmacy Consult:  Heparin Indication: atrial fibrillation  Allergies  Allergen Reactions  . Lipitor [Atorvastatin] Other (See Comments)    Severe constipation    Patient Measurements: Height: 5\' 7"  (170.2 cm) Weight: 186 lb 8.2 oz (84.6 kg) IBW/kg (Calculated) : 66.1 Heparin Dosing Weight: 83 kg  Vital Signs: Temp: 98.6 F (37 C) (09/22 0400) Temp Source: Core (Comment) (09/22 0400) BP: 144/63 (09/22 0400) Pulse Rate: 88 (09/22 0400)  Labs:  Recent Labs  08/13/17 0223  08/13/17 1534  08/14/17 0020 08/14/17 0259 08/14/17 1442 08/15/17 0350  HGB 9.1*  --  8.8*  --   --  9.2*  --   --   HCT 26.0*  --  26.0*  --   --  26.9*  --   --   PLT 129*  --   --   --   --  108*  --   --   APTT  --   --   --   --  134*  --   --   --   HEPARINUNFRC  --   --   --   < > 0.40 0.53 0.33 0.21*  CREATININE 4.45*  < > 3.50*  --   --  2.40* 1.92*  --   < > = values in this interval not displayed.  Estimated Creatinine Clearance: 30.8 mL/min (A) (by C-G formula based on SCr of 1.92 mg/dL (H)).  Assessment: 67 YOM with history of PAF on Eliquis PTA, last dose 07/31/17 at 1800.  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 CBC stable and negative HIT Ab and SRA, therefore, heparin bridge started 08/13/17.   Heparin level is slight subtherapeutic at 0.21 for low goal of 0.3-0.4. No infusion issues per RN. CBC stable, no s/s bleeding noted.     Goal of Therapy:  Heparin level 0.3 - 0.4 per TCTS APTT 66-88 sec Monitor platelets by anticoagulation protocol: Yes   Plan:  Increase heparin gtt to 950 units/hr  Heparin level in 8 hrs Daily heparin level and CBC Monitor for s/s bleeding    Argie Ramming, PharmD Clinical Pharmacist 08/15/17 5:02 AM

## 2017-08-15 NOTE — Plan of Care (Signed)
Problem: Activity: Goal: Risk for activity intolerance will decrease Outcome: Progressing Pt requires maximum assistance getting out of bed. Stedy used to transfer to chair, tolerated well. Physical therapy involved in care and following. Continue to encourage mobility and increase as tolerated.

## 2017-08-15 NOTE — Progress Notes (Signed)
ANTICOAGULATION CONSULT NOTE  Pharmacy Consult:  Heparin Indication: atrial fibrillation  Allergies  Allergen Reactions  . Lipitor [Atorvastatin] Other (See Comments)    Severe constipation    Patient Measurements: Height: 5\' 7"  (170.2 cm) Weight: 182 lb 15.7 oz (83 kg) IBW/kg (Calculated) : 66.1 Heparin Dosing Weight: 83 kg  Vital Signs: Temp: 98.6 F (37 C) (09/22 1500) Temp Source: Core (Comment) (09/22 0800) BP: 120/62 (09/22 1500) Pulse Rate: 89 (09/22 1500)  Labs:  Recent Labs  08/13/17 0223  08/13/17 1534  08/14/17 0020 08/14/17 0259 08/14/17 1442 08/15/17 0350 08/15/17 1413  HGB 9.1*  --  8.8*  --   --  9.2*  --   --   --   HCT 26.0*  --  26.0*  --   --  26.9*  --   --   --   PLT 129*  --   --   --   --  108*  --   --   --   APTT  --   --   --   --  134*  --   --   --   --   HEPARINUNFRC  --   --   --   < > 0.40 0.53 0.33 0.21* 0.14*  CREATININE 4.45*  < > 3.50*  --   --  2.40* 1.92* 1.53*  --   < > = values in this interval not displayed.  Estimated Creatinine Clearance: 38.4 mL/min (A) (by C-G formula based on SCr of 1.53 mg/dL (H)).  Assessment: 75 YOM with history of PAF on Eliquis PTA, last dose 07/31/17 at 1800.  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 CBC stable and negative HIT Ab and SRA, therefore, heparin bridge started 08/13/17.   Heparin level is subtherapeutic at 0.14 for low goal of 0.3-0.4 on drip rate 950 uts/hr. No infusion issues per RN. CBC stable, no s/s bleeding noted.     Goal of Therapy:  Heparin level 0.3 - 0.4 per TCTS APTT 66-88 sec Monitor platelets by anticoagulation protocol: Yes   Plan:  Increase heparin gtt to 1100 units/hr  Heparin level in 6 hrs Daily heparin level and CBC Monitor for s/s bleeding    Bonnita Nasuti Pharm.D. CPP, BCPS Clinical Pharmacist [phone] 08/15/2017 3:32 PM

## 2017-08-15 NOTE — Progress Notes (Addendum)
TCTS DAILY ICU PROGRESS NOTE                   [address]            [place] [address]          [phone]   8 Days Post-Op Procedure(s) (LRB): CORONARY ARTERY BYPASS GRAFTING (CABG) x 2 WITH ENDOSCOPIC HARVESTING OF RIGHT SAPHENOUS VEIN. (N/A) TRANSESOPHAGEAL ECHOCARDIOGRAM (TEE) (N/A)  Total Length of Stay:  LOS: 15 days   Subjective: Resting comfortably on CRRT, back in afib  Objective: Vital signs in last 24 hours: Temp:  [98.2 F (36.8 C)-99.1 F (37.3 C)] 99 F (37.2 C) (09/22 1030) Pulse Rate:  [71-127] 110 (09/22 1030) Cardiac Rhythm: Atrial fibrillation (09/22 0900) Resp:  [12-27] 27 (09/22 1030) BP: (82-169)/(41-80) 82/56 (09/22 1030) SpO2:  [95 %-100 %] 100 % (09/22 1030) Arterial Line BP: (124-224)/(35-64) 150/47 (09/22 1030) Weight:  [182 lb 15.7 oz (83 kg)] 182 lb 15.7 oz (83 kg) (09/22 0600)  Filed Weights   08/13/17 0500 08/14/17 0430 08/15/17 0600  Weight: 187 lb 2.7 oz (84.9 kg) 186 lb 8.2 oz (84.6 kg) 182 lb 15.7 oz (83 kg)    Weight change: -3 lb 8.4 oz (-1.6 kg)   Hemodynamic parameters for last 24 hours: CVP:  [4 mmHg-13 mmHg] 8 mmHg  Intake/Output from previous day: 09/21 0701 - 09/22 0700 In: 1861.3 [I.V.:1359.3; IV Piggyback:502] Out: 8295 [Urine:353]  Intake/Output this shift: Total I/O In: 210.5 [I.V.:167.5; IV Piggyback:43] Out: 456 [Urine:67; Other:389]   Current Meds: Scheduled Meds: . allopurinol  200 mg Oral Daily  . amiodarone  150 mg Intravenous Once  . aspirin EC  325 mg Oral Daily   Or  . aspirin  324 mg Per Tube Daily  . bisacodyl  10 mg Oral Daily   Or  . bisacodyl  10 mg Rectal Daily  . chlorhexidine  15 mL Mouth Rinse BID  . Chlorhexidine Gluconate Cloth  6 each Topical Daily  . docusate sodium  200 mg Oral Daily  . finasteride  5 mg Oral Daily  . insulin aspart  0-24 Units Subcutaneous Q6H  . mouth rinse  15 mL Mouth Rinse q12n4p  . metoprolol tartrate  5 mg Intravenous Q6H  .  pantoprazole (PROTONIX) IV  40 mg Intravenous Q24H  . rosuvastatin  10 mg Oral q1800  . sodium chloride flush  10-40 mL Intracatheter Q12H  . sodium chloride flush  10-40 mL Intracatheter Q12H  . sodium chloride flush  3 mL Intravenous Q12H   Continuous Infusions: . sodium chloride Stopped (08/08/17 0900)  . sodium chloride    . sodium chloride Stopped (08/15/17 0800)  . sodium chloride    . sodium chloride    . amiodarone     Followed by  . amiodarone    . heparin 950 Units/hr (08/15/17 0800)  . lactated ringers 10 mL/hr at 08/10/17 0800  . milrinone 0.125 mcg/kg/min (08/15/17 0800)  . phenylephrine (NEO-SYNEPHRINE) Adult infusion Stopped (08/13/17 1300)  . piperacillin-tazobactam (ZOSYN)  IV Stopped (08/15/17 0535)  . dialysis replacement fluid (prismasate) 500 mL/hr at 08/15/17 0411  . dialysis replacement fluid (prismasate) 300 mL/hr at 08/14/17 2154  . dialysate (PRISMASATE) 1,000 mL/hr at 08/15/17 1013  . sodium phosphate  Dextrose 5% IVPB 30 mmol (08/15/17 0904)  . TPN (CLINIMIX) Adult without lytes 40 mL/hr at 08/15/17 0800  . TPN (CLINIMIX) Adult without lytes     PRN Meds:.sodium chloride, Place/Maintain arterial  line **AND** sodium chloride, Place/Maintain arterial line **AND** sodium chloride, fentaNYL (SUBLIMAZE) injection, heparin, heparin, levalbuterol, metoprolol tartrate, ondansetron (ZOFRAN) IV, RESOURCE THICKENUP CLEAR, sodium chloride flush, sodium chloride flush, sodium chloride flush, white petrolatum  General appearance: alert, cooperative and fatigued Heart: irregularly irregular rhythm and tachy 110's Lungs: dim in bases Abdomen: soft, nontender, + BS Extremities: + pedal edema Wound: incis healing well  Lab Results: CBC: Recent Labs  08/13/17 0223 08/13/17 1534 08/14/17 0259  WBC 9.8  --  7.9  HGB 9.1* 8.8* 9.2*  HCT 26.0* 26.0* 26.9*  PLT 129*  --  108*   BMET:  Recent Labs  08/14/17 1442 08/15/17 0350  NA 136 135  K 3.7 3.7  CL 104  104  CO2 22 24  GLUCOSE 127* 116*  BUN 24* 18  CREATININE 1.92* 1.53*  CALCIUM 8.2* 8.0*    CMET: Lab Results  Component Value Date   WBC 7.9 08/14/2017   HGB 9.2 (L) 08/14/2017   HCT 26.9 (L) 08/14/2017   PLT 108 (L) 08/14/2017   GLUCOSE 116 (H) 08/15/2017   CHOL 162 02/28/2015   TRIG 69 08/14/2017   HDL 31 (L) 02/28/2015   LDLCALC 102 (H) 02/28/2015   ALT 22 08/15/2017   AST 26 08/15/2017   NA 135 08/15/2017   K 3.7 08/15/2017   CL 104 08/15/2017   CREATININE 1.53 (H) 08/15/2017   BUN 18 08/15/2017   CO2 24 08/15/2017   TSH 3.035 08/04/2017   INR 1.48 08/07/2017   HGBA1C 5.3 08/04/2017   ABG    Component Value Date/Time   PHART 7.492 (H) 08/15/2017 0417   PCO2ART 33.6 08/15/2017 0417   PO2ART 73.1 (L) 08/15/2017 0417   HCO3 25.5 08/15/2017 0417   TCO2 22 08/13/2017 1534   ACIDBASEDEF 2.8 (H) 08/13/2017 0430   O2SAT 93.9 08/15/2017 0417     PT/INR: No results for input(s): LABPROT, INR in the last 72 hours. Radiology: Dg Chest Port 1 View  Result Date: 08/15/2017 CLINICAL DATA:  Status post coronary bypass grafting EXAM: PORTABLE CHEST 1 VIEW COMPARISON:  08/14/2017 FINDINGS: Cardiac shadow remains enlarged. Postsurgical changes are again seen. Aortic calcifications are noted. PICC line is noted in the superior aspect of the right atrium stable from the prior exam. The right lung is clear improved from the prior exam. Stable left retrocardiac atelectasis is noted. No pneumothorax is seen. Left subclavian central line is noted as well. IMPRESSION: Stable left retrocardiac atelectasis. Electronically Signed   By: Inez Catalina M.D.   On: 08/15/2017 08:36   Dg Abd Portable 1v  Result Date: 08/15/2017 CLINICAL DATA:  Follow-up ileus EXAM: PORTABLE ABDOMEN - 1 VIEW COMPARISON:  08/14/2017 FINDINGS: Scattered large and small bowel gas is noted. No obstructive changes are seen. Contrast material is noted throughout the colon stable from the prior exam. Diverticulosis  without findings of diverticulitis is noted. No free air is seen. No obstructive changes are noted. IMPRESSION: No acute abnormality noted. No findings to suggest obstructive change are seen. Electronically Signed   By: Inez Catalina M.D.   On: 08/15/2017 08:35     Assessment/Plan: S/P Procedure(s) (LRB): CORONARY ARTERY BYPASS GRAFTING (CABG) x 2 WITH ENDOSCOPIC HARVESTING OF RIGHT SAPHENOUS VEIN. (N/A) TRANSESOPHAGEAL ECHOCARDIOGRAM (TEE) (N/A)  1 slowly improving 2 Afib- cardiology has seen and placed orders for amiodarone, was not responsive previously . On heparin for poss DCCV next week . Co-OX reduced to 49 today, conts milrinone, may need to increase 3 nephrology  managing CRRT/nephrology issues 4 aline not correlating well with cuff, may need to d/c 5 consider post pyloric TF instead of TNA with functional gut, having bowel movements-aspiration is a significant  risk, on zosyn currently 6 rehab as able 7 pulm toilet as able  GOLD,WAYNE E 08/15/2017 10:56 AM   I have seen and examined the patient and agree with the assessment and plan as outlined.  Remains in Afib w/ stable BP - underlying LV function is reportedly not bad so continue milrinone at current dose - drop in co-ox may be preload related since pulling 100 mL/hr via CVVHD.  Avoid placement of feeding tube per Dr Prescott Gum due to concerns regarding previous stroke and risk of aspiration.  Continue heparin.  Rexene Alberts, MD 08/15/2017 11:46 AM

## 2017-08-15 NOTE — Procedures (Signed)
Admit: 07/31/2017 LOS: 7  80M wih AoCKD3 s/p 2V CABG 9/14, aspiration events 9/18  Current CRRT Prescription: Start Date: 08/13/17 Catheter: L [place] Temp HD cath placed TCTS 08/12/17 BFR: 200 Pre Blood Pump: 500 4K DFR: 1000 4K Replacement Rate: 300 4K Goal UF: 75mL net neg/hr Anticoagulation: systemic heparin Clotting: none since starting  S: UOP dropoped off yesterday Tol UF with CRRT Off dopamine, still on milrinone On TPN Low Phos this AM Weights trending down Daughter updated at bedside  O: 09/21 0701 - 09/22 0700 In: 1861.3 [I.V.:1359.3; IV Piggyback:502] Out: 3365 [Urine:353]  Filed Weights   08/13/17 0500 08/14/17 0430 08/15/17 0600  Weight: 84.9 kg (187 lb 2.7 oz) 84.6 kg (186 lb 8.2 oz) 83 kg (182 lb 15.7 oz)     Recent Labs Lab 08/14/17 0259 08/14/17 1442 08/15/17 0350  NA 137 136 135  K 3.3* 3.7 3.7  CL 104 104 104  CO2 23 22 24   GLUCOSE 129* 127* 116*  BUN 30* 24* 18  CREATININE 2.40* 1.92* 1.53*  CALCIUM 7.8* 8.2* 8.0*  PHOS 2.2* 2.3* 1.4*    Recent Labs Lab 08/10/17 0846 08/10/17 1559  08/12/17 0346  08/13/17 0223 08/13/17 1534 08/14/17 0259  WBC 13.6* 15.5*  < > 9.0  --  9.8  --  7.9  NEUTROABS 11.9* 13.4*  --   --   --   --   --  6.2  HGB 9.0* 9.3*  < > 8.5*  < > 9.1* 8.8* 9.2*  HCT 25.9* 27.5*  < > 24.6*  < > 26.0* 26.0* 26.9*  MCV 98.9 99.3  < > 98.0  --  96.7  --  97.8  PLT 120* 134*  < > 129*  --  129*  --  108*  < > = values in this interval not displayed.  Scheduled Meds: . allopurinol  200 mg Oral Daily  . aspirin EC  325 mg Oral Daily   Or  . aspirin  324 mg Per Tube Daily  . bisacodyl  10 mg Oral Daily   Or  . bisacodyl  10 mg Rectal Daily  . chlorhexidine  15 mL Mouth Rinse BID  . Chlorhexidine Gluconate Cloth  6 each Topical Daily  . docusate sodium  200 mg Oral Daily  . finasteride  5 mg Oral Daily  . insulin aspart  0-24 Units Subcutaneous Q6H  . levalbuterol  1.25 mg Nebulization TID  . mouth rinse  15 mL Mouth  Rinse q12n4p  . metoprolol tartrate  5 mg Intravenous Q6H  . pantoprazole (PROTONIX) IV  40 mg Intravenous Q24H  . rosuvastatin  10 mg Oral q1800  . sodium chloride flush  10-40 mL Intracatheter Q12H  . sodium chloride flush  10-40 mL Intracatheter Q12H  . sodium chloride flush  3 mL Intravenous Q12H   Continuous Infusions: . sodium chloride Stopped (08/08/17 0900)  . sodium chloride    . sodium chloride 10 mL/hr at 08/14/17 2000  . sodium chloride    . sodium chloride    . heparin 950 Units/hr (08/15/17 0504)  . lactated ringers 10 mL/hr at 08/10/17 0800  . milrinone 0.125 mcg/kg/min (08/15/17 0303)  . phenylephrine (NEO-SYNEPHRINE) Adult infusion Stopped (08/13/17 1300)  . piperacillin-tazobactam (ZOSYN)  IV Stopped (08/15/17 0535)  . dialysis replacement fluid (prismasate) 500 mL/hr at 08/15/17 0411  . dialysis replacement fluid (prismasate) 300 mL/hr at 08/14/17 2154  . dialysate (PRISMASATE) 1,000 mL/hr at 08/15/17 0510  . TPN (CLINIMIX) Adult  without lytes 40 mL/hr at 08/14/17 1900   PRN Meds:.sodium chloride, Place/Maintain arterial line **AND** sodium chloride, Place/Maintain arterial line **AND** sodium chloride, fentaNYL (SUBLIMAZE) injection, heparin, heparin, metoprolol tartrate, ondansetron (ZOFRAN) IV, RESOURCE THICKENUP CLEAR, sodium chloride flush, sodium chloride flush, sodium chloride flush  ABG    Component Value Date/Time   PHART 7.492 (H) 08/15/2017 0417   PCO2ART 33.6 08/15/2017 0417   PO2ART 73.1 (L) 08/15/2017 0417   HCO3 25.5 08/15/2017 0417   TCO2 22 08/13/2017 1534   ACIDBASEDEF 2.8 (H) 08/13/2017 0430   O2SAT 93.9 08/15/2017 0417    A/P  1. Dialysis dependent AoCKD3, nonoliguric, baseline SCr in 1.5-2.0; likely ATN post CABG 2. CAD s/p 2V CABG 08/07/17; rmeains on milrinone  3. BPH on outpt finasteride, foley present currently 4. AFib with RVR, on amio / heparin systemic 5. Chronic mild TCP, seen by [place], ?ITP 6. Hypophosphatemia on  CRRT  P  Cont CRRT, inc to 196mL net neg, all 4k, no circuit heparin, 500/1000/300 flow.   Replete Phos  Cont to Hold diuretics  Pearson Grippe, MD [place] pgr [phone]

## 2017-08-15 NOTE — Progress Notes (Signed)
Progress Note  Patient Name: Stanley Robles Date of Encounter: 08/15/2017  Primary Cardiologist: Dr. Wynonia Lawman  Subjective   Feeling OK.  Back pain better after pain meds.    Inpatient Medications    Scheduled Meds: . allopurinol  200 mg Oral Daily  . aspirin EC  325 mg Oral Daily   Or  . aspirin  324 mg Per Tube Daily  . bisacodyl  10 mg Oral Daily   Or  . bisacodyl  10 mg Rectal Daily  . chlorhexidine  15 mL Mouth Rinse BID  . Chlorhexidine Gluconate Cloth  6 each Topical Daily  . docusate sodium  200 mg Oral Daily  . finasteride  5 mg Oral Daily  . insulin aspart  0-24 Units Subcutaneous Q6H  . mouth rinse  15 mL Mouth Rinse q12n4p  . metoprolol tartrate  5 mg Intravenous Q6H  . pantoprazole (PROTONIX) IV  40 mg Intravenous Q24H  . rosuvastatin  10 mg Oral q1800  . sodium chloride flush  10-40 mL Intracatheter Q12H  . sodium chloride flush  10-40 mL Intracatheter Q12H  . sodium chloride flush  3 mL Intravenous Q12H   Continuous Infusions: . sodium chloride Stopped (08/08/17 0900)  . sodium chloride    . sodium chloride Stopped (08/15/17 0800)  . sodium chloride    . sodium chloride    . heparin 950 Units/hr (08/15/17 0800)  . lactated ringers 10 mL/hr at 08/10/17 0800  . milrinone 0.125 mcg/kg/min (08/15/17 0800)  . phenylephrine (NEO-SYNEPHRINE) Adult infusion Stopped (08/13/17 1300)  . piperacillin-tazobactam (ZOSYN)  IV Stopped (08/15/17 0535)  . dialysis replacement fluid (prismasate) 500 mL/hr at 08/15/17 0411  . dialysis replacement fluid (prismasate) 300 mL/hr at 08/14/17 2154  . dialysate (PRISMASATE) 1,000 mL/hr at 08/15/17 0510  . sodium phosphate  Dextrose 5% IVPB 30 mmol (08/15/17 0904)  . TPN (CLINIMIX) Adult without lytes 40 mL/hr at 08/15/17 0800  . TPN (CLINIMIX) Adult without lytes     PRN Meds: sodium chloride, Place/Maintain arterial line **AND** sodium chloride, Place/Maintain arterial line **AND** sodium chloride, fentaNYL (SUBLIMAZE)  injection, heparin, heparin, levalbuterol, metoprolol tartrate, ondansetron (ZOFRAN) IV, RESOURCE THICKENUP CLEAR, sodium chloride flush, sodium chloride flush, sodium chloride flush, white petrolatum   Vital Signs    Vitals:   08/15/17 0830 08/15/17 0900 08/15/17 0930 08/15/17 1000  BP: 133/80 91/64 114/73 122/76  Pulse: 89 (!) 111 (!) 127 (!) 125  Resp: (!) 21 15 14  (!) 21  Temp: 99 F (37.2 C) 99 F (37.2 C) 99 F (37.2 C) 98.8 F (37.1 C)  TempSrc:      SpO2: 99% 98% 100% 100%  Weight:      Height:        Intake/Output Summary (Last 24 hours) at 08/15/17 1013 Last data filed at 08/15/17 1000  Gross per 24 hour  Intake          1917.27 ml  Output             3545 ml  Net         -1627.73 ml   Filed Weights   08/13/17 0500 08/14/17 0430 08/15/17 0600  Weight: 84.9 kg (187 lb 2.7 oz) 84.6 kg (186 lb 8.2 oz) 83 kg (182 lb 15.7 oz)    Telemetry    Atrial fibrillation. Occasional PVCs.  Rate 100s-110s.  Overnight sinus rhythm with PACs and blocked PACs. - Personally Reviewed  ECG    N/a - Personally Reviewed  Physical Exam  GEN: Chronically ill-appearing.  No acute distress.   Neck: Unable to assess JVD. Cardiac: RRR, no murmurs, rubs, or gallops.  Respiratory: Clear to auscultation bilaterally. GI: Soft, nontender, non-distended  MS: 2+ pitting edema; No deformity. Neuro:  Nonfocal  Psych: Normal affect   Labs    Chemistry Recent Labs Lab 08/12/17 0346  08/13/17 0223  08/14/17 0259 08/14/17 1442 08/15/17 0350  NA 133*  < > 135  < > 137 136 135  K 3.5  < > 3.6  < > 3.3* 3.7 3.7  CL 99*  < > 99*  < > 104 104 104  CO2 24  < > 21*  < > 23 22 24   GLUCOSE 108*  < > 132*  < > 129* 127* 116*  BUN 45*  < > 55*  < > 30* 24* 18  CREATININE 3.62*  < > 4.45*  < > 2.40* 1.92* 1.53*  CALCIUM 8.1*  < > 8.0*  < > 7.8* 8.2* 8.0*  PROT 4.7*  --  4.7*  --   --   --  5.1*  ALBUMIN 2.2*  --  2.1*  < > 2.0* 2.3* 2.2*  AST 30  --  25  --   --   --  26  ALT 30  --  25   --   --   --  22  ALKPHOS 57  --  61  --   --   --  60  BILITOT 0.9  --  1.4*  --   --   --  1.2  GFRNONAA 14*  < > 11*  < > 24* 31* 41*  GFRAA 17*  < > 13*  < > 27* 36* 47*  ANIONGAP 10  < > 15  < > 10 10 7   < > = values in this interval not displayed.   Hematology Recent Labs Lab 08/12/17 0346  08/13/17 0223 08/13/17 1534 08/14/17 0259  WBC 9.0  --  9.8  --  7.9  RBC 2.51*  --  2.69*  --  2.75*  HGB 8.5*  < > 9.1* 8.8* 9.2*  HCT 24.6*  < > 26.0* 26.0* 26.9*  MCV 98.0  --  96.7  --  97.8  MCH 33.9  --  33.8  --  33.5  MCHC 34.6  --  35.0  --  34.2  RDW 14.8  --  16.2*  --  16.6*  PLT 129*  --  129*  --  108*  < > = values in this interval not displayed.  Cardiac EnzymesNo results for input(s): TROPONINI in the last 168 hours. No results for input(s): TROPIPOC in the last 168 hours.   BNPNo results for input(s): BNP, PROBNP in the last 168 hours.   DDimer No results for input(s): DDIMER in the last 168 hours.   Radiology    Dg Chest Port 1 View  Result Date: 08/15/2017 CLINICAL DATA:  Status post coronary bypass grafting EXAM: PORTABLE CHEST 1 VIEW COMPARISON:  08/14/2017 FINDINGS: Cardiac shadow remains enlarged. Postsurgical changes are again seen. Aortic calcifications are noted. PICC line is noted in the superior aspect of the right atrium stable from the prior exam. The right lung is clear improved from the prior exam. Stable left retrocardiac atelectasis is noted. No pneumothorax is seen. Left subclavian central line is noted as well. IMPRESSION: Stable left retrocardiac atelectasis. Electronically Signed   By: Inez Catalina M.D.   On: 08/15/2017 08:36   Dg Chest  Port 1 View  Result Date: 08/14/2017 CLINICAL DATA:  Sore chest after cardiac surgery EXAM: PORTABLE CHEST 1 VIEW COMPARISON:  Yesterday FINDINGS: Low volume chest with hazy opacity from layering effusions. There is vascular congestion and basal atelectasis. No pneumothorax visible. Stable cardiomegaly. Left  upper extremity PICC with tip at the distal SVC. IMPRESSION: 1. Stable from yesterday. 2. Low volume chest with atelectasis, effusions, and vascular congestion. Electronically Signed   By: Monte Fantasia M.D.   On: 08/14/2017 08:05   Dg Abd Portable 1v  Result Date: 08/15/2017 CLINICAL DATA:  Follow-up ileus EXAM: PORTABLE ABDOMEN - 1 VIEW COMPARISON:  08/14/2017 FINDINGS: Scattered large and small bowel gas is noted. No obstructive changes are seen. Contrast material is noted throughout the colon stable from the prior exam. Diverticulosis without findings of diverticulitis is noted. No free air is seen. No obstructive changes are noted. IMPRESSION: No acute abnormality noted. No findings to suggest obstructive change are seen. Electronically Signed   By: Inez Catalina M.D.   On: 08/15/2017 08:35   Dg Abd Portable 1v  Result Date: 08/14/2017 CLINICAL DATA:  Ileus EXAM: PORTABLE ABDOMEN - 1 VIEW COMPARISON:  August 13, 2017 FINDINGS: There is contrast in the colon. There are multiple contrast filled colonic diverticulum. There is no bowel dilatation or air-fluid level to suggest bowel obstruction. No free air. Consolidation noted in left lung base. Ovary pacemaker wires are attached to the right heart. IMPRESSION: No bowel dilatation or air-fluid level to suggest ileus or bowel obstruction. No free air. Multiple colonic filled diverticulum. Persistent left lower lobe consolidation. Right lung base appears clear. Pacemaker wires attached to right heart. Electronically Signed   By: Lowella Grip III M.D.   On: 08/14/2017 08:02    Cardiac Studies   LHC 08/03/17:  Ost LM lesion, 75 %stenosed.   1. Severe ostial stenosis of left circumflex with a 95% ostial lesion 2. Severe left main stenosis with calcification and a filling defect that may represent a plaque rupture 3. Chronic total occlusion of the right coronary artery collateralized by left to right collaterals 4. Normal LV function by  echo  Echo 08/02/17: Study Conclusions  - Left ventricle: The cavity size was normal. There was mild   concentric hypertrophy. Systolic function was vigorous. The   estimated ejection fraction was in the range of 65% to 70%. Wall   motion was normal; there were no regional wall motion   abnormalities. - Aortic valve: There was mild regurgitation. - Mitral valve: Calcified annulus. There was moderate   regurgitation. - Left atrium: The atrium was mildly dilated. - Right ventricle: The cavity size was mildly dilated. Wall   thickness was normal. - Pulmonary arteries: Systolic pressure was mildly increased. PA   peak pressure: 33 mm Hg (S).  Patient Profile     83 y.o. male who is s/p CABG for severe LM stenosis, pre-op brainstem stroke, parosysmal atrial fibrillation, and ITP now with post-op hypotension and acute on chronic renal failure.  Assessment & Plan    # CAD s/p CABG:  Continue aspirin and metoprolol.  Start statin when able to take oral meds.  # Paroxysmal atrial fibrillation:  Mr. Clarin is back in atrial fibrillation and BP is soft.  Co-ox was only 40 today.  It was 46 on 9/21.  Will start amiodarone bolus and infusion.   heparin  # Hypotension:  # Cardiogenic shock: Mr. Mitcham continues to require milrinone.  Co-ox 40.  Will start amiodarone and  repeat co-ox at 2pm.  May need to add additional pressor.  MAP has remained >60.  # Acute on chronic renal failure: CRRT   Time spent: 45 minutes-Greater than 50% of this time was spent in counseling, explanation of diagnosis, planning of further management, and coordination of care.  For questions or updates, please contact [place] Please consult [email or web address] for contact info under Cardiology/STEMI.      Signed, Skeet Latch, MD  08/15/2017, 10:13 AM

## 2017-08-15 NOTE — Clinical Social Work Note (Signed)
CSW continues to follow for discharge needs.  Ordean Fouts, CSW [phone]  

## 2017-08-15 NOTE — Progress Notes (Signed)
TCTS BRIEF SICU PROGRESS NOTE  8 Days Post-Op  S/P Procedure(s) (LRB): CORONARY ARTERY BYPASS GRAFTING (CABG) x 2 WITH ENDOSCOPIC HARVESTING OF RIGHT SAPHENOUS VEIN. (N/A) TRANSESOPHAGEAL ECHOCARDIOGRAM (TEE) (N/A)   Stable day More alert this afternoon Back in NSR Stable BP Tolerating taking off 100 mL/hr via CVVHD  Plan: Continue current plan  Rexene Alberts, MD 08/15/2017 4:44 PM

## 2017-08-16 ENCOUNTER — Inpatient Hospital Stay (HOSPITAL_COMMUNITY): Payer: Medicare Other

## 2017-08-16 DIAGNOSIS — Z951 Presence of aortocoronary bypass graft: Secondary | ICD-10-CM

## 2017-08-16 DIAGNOSIS — I110 Hypertensive heart disease with heart failure: Secondary | ICD-10-CM

## 2017-08-16 DIAGNOSIS — I5031 Acute diastolic (congestive) heart failure: Secondary | ICD-10-CM

## 2017-08-16 LAB — RENAL FUNCTION PANEL
Albumin: 2.3 g/dL — ABNORMAL LOW (ref 3.5–5.0)
Albumin: 2.3 g/dL — ABNORMAL LOW (ref 3.5–5.0)
Albumin: 2.5 g/dL — ABNORMAL LOW (ref 3.5–5.0)
Anion gap: 7 (ref 5–15)
Anion gap: 8 (ref 5–15)
Anion gap: 8 (ref 5–15)
BUN: 15 mg/dL (ref 6–20)
BUN: 15 mg/dL (ref 6–20)
BUN: 18 mg/dL (ref 6–20)
CHLORIDE: 103 mmol/L (ref 101–111)
CO2: 23 mmol/L (ref 22–32)
CO2: 22 mmol/L (ref 22–32)
CO2: 22 mmol/L (ref 22–32)
Calcium: 8 mg/dL — ABNORMAL LOW (ref 8.9–10.3)
Calcium: 8.1 mg/dL — ABNORMAL LOW (ref 8.9–10.3)
Calcium: 8.2 mg/dL — ABNORMAL LOW (ref 8.9–10.3)
Chloride: 99 mmol/L — ABNORMAL LOW (ref 101–111)
Chloride: 103 mmol/L (ref 101–111)
Creatinine, Ser: 1.3 mg/dL — ABNORMAL HIGH (ref 0.61–1.24)
Creatinine, Ser: 1.25 mg/dL — ABNORMAL HIGH (ref 0.61–1.24)
Creatinine, Ser: 1.27 mg/dL — ABNORMAL HIGH (ref 0.61–1.24)
GFR calc Af Amer: 57 mL/min — ABNORMAL LOW
GFR calc Af Amer: >60 mL/min
GFR calc Af Amer: 59 mL/min — ABNORMAL LOW (ref >60)
GFR calc non Af Amer: 49 mL/min — ABNORMAL LOW
GFR calc non Af Amer: 52 mL/min — ABNORMAL LOW
GFR, EST NON AFRICAN AMERICAN: 51 mL/min — AB (ref >60)
Glucose, Bld: 535 mg/dL (ref 65–99)
Glucose, Bld: 144 mg/dL — ABNORMAL HIGH (ref 65–99)
Glucose, Bld: 145 mg/dL — ABNORMAL HIGH (ref 65–99)
Phosphorus: 1.2 mg/dL — ABNORMAL LOW (ref 2.5–4.6)
Phosphorus: 1.2 mg/dL — ABNORMAL LOW (ref 2.5–4.6)
Phosphorus: 2.5 mg/dL (ref 2.5–4.6)
Potassium: 3.5 mmol/L (ref 3.5–5.1)
Potassium: 3.4 mmol/L — ABNORMAL LOW (ref 3.5–5.1)
Potassium: 3.8 mmol/L (ref 3.5–5.1)
Sodium: 129 mmol/L — ABNORMAL LOW (ref 135–145)
Sodium: 133 mmol/L — ABNORMAL LOW (ref 135–145)
Sodium: 133 mmol/L — ABNORMAL LOW (ref 135–145)

## 2017-08-16 LAB — CULTURE, BLOOD (ROUTINE X 2)
Culture: NO GROWTH
Culture: NO GROWTH
Special Requests: ADEQUATE
Special Requests: ADEQUATE

## 2017-08-16 LAB — POCT I-STAT 3, ART BLOOD GAS (G3+)
Acid-Base Excess: 1 mmol/L (ref 0.0–2.0)
Acid-Base Excess: 3 mmol/L — ABNORMAL HIGH (ref 0.0–2.0)
Bicarbonate: 20.7 mmol/L (ref 20.0–28.0)
Bicarbonate: 25.6 mmol/L (ref 20.0–28.0)
O2 Saturation: 98 %
O2 Saturation: 91 %
Patient temperature: 37.1
Patient temperature: 37.1
TCO2: 21 mmol/L — ABNORMAL LOW (ref 22–32)
TCO2: 27 mmol/L (ref 22–32)
pCO2 arterial: 19.4 mmHg — CL (ref 32.0–48.0)
pCO2 arterial: 32.9 mmHg (ref 32.0–48.0)
pH, Arterial: 7.638 (ref 7.350–7.450)
pH, Arterial: 7.499 — ABNORMAL HIGH (ref 7.350–7.450)
pO2, Arterial: 81 mmHg — ABNORMAL LOW (ref 83.0–108.0)
pO2, Arterial: 56 mmHg — ABNORMAL LOW (ref 83.0–108.0)

## 2017-08-16 LAB — COOXEMETRY PANEL
Carboxyhemoglobin: 1 % (ref 0.5–1.5)
Methemoglobin: 0.9 % (ref 0.0–1.5)
O2 Saturation: 62.1 %
Total hemoglobin: 11.1 g/dL — ABNORMAL LOW (ref 12.0–16.0)

## 2017-08-16 LAB — COMPREHENSIVE METABOLIC PANEL WITH GFR
ALT: 23 U/L (ref 17–63)
AST: 26 U/L (ref 15–41)
Albumin: 2.4 g/dL — ABNORMAL LOW (ref 3.5–5.0)
Alkaline Phosphatase: 64 U/L (ref 38–126)
Anion gap: 7 (ref 5–15)
BUN: 16 mg/dL (ref 6–20)
CO2: 23 mmol/L (ref 22–32)
Calcium: 8.2 mg/dL — ABNORMAL LOW (ref 8.9–10.3)
Chloride: 103 mmol/L (ref 101–111)
Creatinine, Ser: 1.27 mg/dL — ABNORMAL HIGH (ref 0.61–1.24)
GFR calc Af Amer: 59 mL/min — ABNORMAL LOW
GFR calc non Af Amer: 51 mL/min — ABNORMAL LOW
Glucose, Bld: 126 mg/dL — ABNORMAL HIGH (ref 65–99)
Potassium: 3.7 mmol/L (ref 3.5–5.1)
Sodium: 133 mmol/L — ABNORMAL LOW (ref 135–145)
Total Bilirubin: 0.9 mg/dL (ref 0.3–1.2)
Total Protein: 5.7 g/dL — ABNORMAL LOW (ref 6.5–8.1)

## 2017-08-16 LAB — MAGNESIUM: Magnesium: 2.3 mg/dL (ref 1.7–2.4)

## 2017-08-16 LAB — GLUCOSE, CAPILLARY
GLUCOSE-CAPILLARY: 127 mg/dL — AB (ref 65–99)
GLUCOSE-CAPILLARY: 132 mg/dL — AB (ref 65–99)
Glucose-Capillary: 111 mg/dL — ABNORMAL HIGH (ref 65–99)
Glucose-Capillary: 85 mg/dL (ref 65–99)

## 2017-08-16 LAB — HEPARIN LEVEL (UNFRACTIONATED)
Heparin Unfractionated: 0.28 [IU]/mL — ABNORMAL LOW (ref 0.30–0.70)
Heparin Unfractionated: 0.31 [IU]/mL (ref 0.30–0.70)

## 2017-08-16 MED ORDER — CHLORHEXIDINE GLUCONATE CLOTH 2 % EX PADS
6.0000 | MEDICATED_PAD | Freq: Every day | CUTANEOUS | Status: DC
  Administered 2017-08-16 – 2017-08-21 (×6): 6 via TOPICAL

## 2017-08-16 MED ORDER — POTASSIUM PHOSPHATES 15 MMOLE/5ML IV SOLN
40.0000 meq | Freq: Once | INTRAVENOUS | Status: DC
  Filled 2017-08-16: qty 9.09

## 2017-08-16 MED ORDER — TRACE MINERALS CR-CU-MN-SE-ZN 10-1000-500-60 MCG/ML IV SOLN
INTRAVENOUS | Status: AC
  Administered 2017-08-16: 18:00:00 via INTRAVENOUS
  Filled 2017-08-16: qty 1992

## 2017-08-16 MED ORDER — SODIUM CHLORIDE 0.9 % IV SOLN
15.0000 mmol | Freq: Once | INTRAVENOUS | Status: AC
  Administered 2017-08-16: 15 mmol via INTRAVENOUS
  Filled 2017-08-16: qty 5

## 2017-08-16 MED ORDER — POTASSIUM CHLORIDE 10 MEQ/50ML IV SOLN
10.0000 meq | INTRAVENOUS | Status: AC
  Administered 2017-08-16 (×3): 10 meq via INTRAVENOUS
  Filled 2017-08-16 (×3): qty 50

## 2017-08-16 MED ORDER — SODIUM PHOSPHATES 45 MMOLE/15ML IV SOLN
30.0000 mmol | Freq: Once | INTRAVENOUS | Status: AC
  Administered 2017-08-16: 30 mmol via INTRAVENOUS
  Filled 2017-08-16: qty 10

## 2017-08-16 MED ORDER — FENTANYL CITRATE (PF) 100 MCG/2ML IJ SOLN
12.5000 ug | INTRAMUSCULAR | Status: DC | PRN
  Administered 2017-08-16 – 2017-08-17 (×4): 12.5 ug via INTRAVENOUS
  Filled 2017-08-16 (×4): qty 2

## 2017-08-16 NOTE — Progress Notes (Signed)
Progress Note  Patient Name: Stanley Robles Date of Encounter: 08/16/2017  Primary Cardiologist: Dr. Wynonia Lawman  Subjective   Sleepy.  Denies chest pain or shortness of breath.  No palpitations.   Inpatient Medications    Scheduled Meds: . allopurinol  200 mg Oral Daily  . aspirin EC  325 mg Oral Daily   Or  . aspirin  324 mg Per Tube Daily  . bisacodyl  10 mg Oral Daily   Or  . bisacodyl  10 mg Rectal Daily  . chlorhexidine  15 mL Mouth Rinse BID  . Chlorhexidine Gluconate Cloth  6 each Topical Q0600  . docusate sodium  200 mg Oral Daily  . finasteride  5 mg Oral Daily  . insulin aspart  0-24 Units Subcutaneous Q6H  . mouth rinse  15 mL Mouth Rinse q12n4p  . metoprolol tartrate  5 mg Intravenous Q6H  . pantoprazole (PROTONIX) IV  40 mg Intravenous Q24H  . rosuvastatin  10 mg Oral q1800  . sodium chloride flush  10-40 mL Intracatheter Q12H  . sodium chloride flush  10-40 mL Intracatheter Q12H  . sodium chloride flush  3 mL Intravenous Q12H   Continuous Infusions: . sodium chloride Stopped (08/08/17 0900)  . sodium chloride    . sodium chloride Stopped (08/15/17 0800)  . sodium chloride    . sodium chloride    . amiodarone 30 mg/hr (08/16/17 0942)  . heparin 1,250 Units/hr (08/16/17 0942)  . lactated ringers 10 mL/hr at 08/10/17 0800  . milrinone 0.125 mcg/kg/min (08/16/17 0807)  . phenylephrine (NEO-SYNEPHRINE) Adult infusion Stopped (08/13/17 1300)  . piperacillin-tazobactam (ZOSYN)  IV Stopped (08/16/17 2025)  . potassium chloride 10 mEq (08/16/17 0930)  . potassium PHOSPHATE IVPB (mEq)    . dialysis replacement fluid (prismasate) 500 mL/hr at 08/15/17 1506  . dialysis replacement fluid (prismasate) 300 mL/hr at 08/16/17 0933  . dialysate (PRISMASATE) 1,000 mL/hr at 08/16/17 0809  . sodium phosphate  Dextrose 5% IVPB    . TPN (CLINIMIX) Adult without lytes 60 mL/hr at 08/16/17 0700  . TPN (CLINIMIX) Adult without lytes     PRN Meds: sodium chloride,  Place/Maintain arterial line **AND** sodium chloride, Place/Maintain arterial line **AND** sodium chloride, fentaNYL (SUBLIMAZE) injection, heparin, heparin, levalbuterol, metoprolol tartrate, ondansetron (ZOFRAN) IV, RESOURCE THICKENUP CLEAR, sodium chloride flush, sodium chloride flush, sodium chloride flush, white petrolatum   Vital Signs    Vitals:   08/16/17 0600 08/16/17 0700 08/16/17 0800 08/16/17 0900  BP: 104/65 (!) 108/51 (!) 106/57 108/75  Pulse: (!) 107 79 86 (!) 117  Resp: (!) 30 (!) 26 19 15   Temp: 98.8 F (37.1 C) 98.6 F (37 C) 98.8 F (37.1 C) 97.7 F (36.5 C)  TempSrc:      SpO2: 100% 100% 94% 99%  Weight:      Height:        Intake/Output Summary (Last 24 hours) at 08/16/17 0950 Last data filed at 08/16/17 0900  Gross per 24 hour  Intake          2481.52 ml  Output             5231 ml  Net         -2749.48 ml   Filed Weights   08/14/17 0430 08/15/17 0600 08/16/17 0325  Weight: 84.6 kg (186 lb 8.2 oz) 83 kg (182 lb 15.7 oz) 82.6 kg (182 lb 1.6 oz)    Telemetry    Atrial fibrillation. Occasional PVCs.  Rate 100s-110s.  Overnight sinus rhythm with PACs and blocked PACs. - Personally Reviewed  ECG    N/a - Personally Reviewed  Physical Exam   GEN: Chronically ill-appearing.  No acute distress.   Neck: No JVD. Cardiac: Irregularly irregular.  Tachycardic.  no murmurs, rubs, or gallops.  Respiratory: Clear to auscultation bilaterally. GI: Soft, nontender, non-distended  MS: Trace edema; No deformity. Neuro:  Nonfocal  Psych: Normal affect   Labs    Chemistry Recent Labs Lab 08/13/17 0223  08/15/17 0350 08/15/17 1740 08/16/17 0410 08/16/17 0724  NA 135  < > 135 136 133* 129*  K 3.6  < > 3.7 3.4* 3.7 3.5  CL 99*  < > 104 104 103 99*  CO2 21*  < > 24 24 23 23   GLUCOSE 132*  < > 116* 122* 126* 535*  BUN 55*  < > 18 17 16 15   CREATININE 4.45*  < > 1.53* 1.38* 1.27* 1.30*  CALCIUM 8.0*  < > 8.0* 8.0* 8.2* 8.0*  PROT 4.7*  --  5.1*  --  5.7*   --   ALBUMIN 2.1*  < > 2.2* 2.3* 2.4* 2.3*  AST 25  --  26  --  26  --   ALT 25  --  22  --  23  --   ALKPHOS 61  --  60  --  64  --   BILITOT 1.4*  --  1.2  --  0.9  --   GFRNONAA 11*  < > 41* 46* 51* 49*  GFRAA 13*  < > 47* 53* 59* 57*  ANIONGAP 15  < > 7 8 7 7   < > = values in this interval not displayed.   Hematology Recent Labs Lab 08/12/17 0346  08/13/17 0223 08/13/17 1534 08/14/17 0259  WBC 9.0  --  9.8  --  7.9  RBC 2.51*  --  2.69*  --  2.75*  HGB 8.5*  < > 9.1* 8.8* 9.2*  HCT 24.6*  < > 26.0* 26.0* 26.9*  MCV 98.0  --  96.7  --  97.8  MCH 33.9  --  33.8  --  33.5  MCHC 34.6  --  35.0  --  34.2  RDW 14.8  --  16.2*  --  16.6*  PLT 129*  --  129*  --  108*  < > = values in this interval not displayed.  Cardiac EnzymesNo results for input(s): TROPONINI in the last 168 hours. No results for input(s): TROPIPOC in the last 168 hours.   BNPNo results for input(s): BNP, PROBNP in the last 168 hours.   DDimer No results for input(s): DDIMER in the last 168 hours.   Radiology    Dg Chest Port 1 View  Result Date: 08/15/2017 CLINICAL DATA:  Status post coronary bypass grafting EXAM: PORTABLE CHEST 1 VIEW COMPARISON:  08/14/2017 FINDINGS: Cardiac shadow remains enlarged. Postsurgical changes are again seen. Aortic calcifications are noted. PICC line is noted in the superior aspect of the right atrium stable from the prior exam. The right lung is clear improved from the prior exam. Stable left retrocardiac atelectasis is noted. No pneumothorax is seen. Left subclavian central line is noted as well. IMPRESSION: Stable left retrocardiac atelectasis. Electronically Signed   By: Inez Catalina M.D.   On: 08/15/2017 08:36   Dg Abd Portable 1v  Result Date: 08/15/2017 CLINICAL DATA:  Follow-up ileus EXAM: PORTABLE ABDOMEN - 1 VIEW COMPARISON:  08/14/2017 FINDINGS: Scattered large and small bowel  gas is noted. No obstructive changes are seen. Contrast material is noted throughout the  colon stable from the prior exam. Diverticulosis without findings of diverticulitis is noted. No free air is seen. No obstructive changes are noted. IMPRESSION: No acute abnormality noted. No findings to suggest obstructive change are seen. Electronically Signed   By: Inez Catalina M.D.   On: 08/15/2017 08:35    Cardiac Studies   LHC 08/03/17:  Ost LM lesion, 75 %stenosed.   1. Severe ostial stenosis of left circumflex with a 95% ostial lesion 2. Severe left main stenosis with calcification and a filling defect that may represent a plaque rupture 3. Chronic total occlusion of the right coronary artery collateralized by left to right collaterals 4. Normal LV function by echo  Echo 08/02/17: Study Conclusions  - Left ventricle: The cavity size was normal. There was mild   concentric hypertrophy. Systolic function was vigorous. The   estimated ejection fraction was in the range of 65% to 70%. Wall   motion was normal; there were no regional wall motion   abnormalities. - Aortic valve: There was mild regurgitation. - Mitral valve: Calcified annulus. There was moderate   regurgitation. - Left atrium: The atrium was mildly dilated. - Right ventricle: The cavity size was mildly dilated. Wall   thickness was normal. - Pulmonary arteries: Systolic pressure was mildly increased. PA   peak pressure: 33 mm Hg (S).  Patient Profile     83 y.o. male who is s/p CABG for severe LM stenosis, pre-op brainstem stroke, parosysmal atrial fibrillation, and ITP now with post-op hypotension and acute on chronic renal failure.  Assessment & Plan    # CAD s/p CABG:  Continue aspirin and metoprolol.  Start statin when able to take oral meds.  # Paroxysmal atrial fibrillation:  Mr. Schauer continues to go in and out of atrial fibrillation.  Co-ox this am improved to 62.  BP is a little better  is back in atrial fibrillation and BP is soft.  He is ordered for metoprolol 5mg  IV q6h but not receiving most doses  2/2 low BP.    Will add hold parameters to hold for SBP <90, HR<60.  Continue amiodarone and heparin.  # Hypotension:  # Cardiogenic shock: Co-ox improved to 62 today.  Milrinone may not be helping his hypotension.  Unclear why he is still needing this given his normal systolic function.   # Acute on chronic renal failure: CRRT   Time spent: 45 minutes-Greater than 50% of this time was spent in counseling, explanation of diagnosis, planning of further management, and coordination of care.  For questions or updates, please contact [facility] Please consult [email or web address] for contact info under Cardiology/STEMI.      Signed, Skeet Latch, MD  08/16/2017, 9:50 AM

## 2017-08-16 NOTE — Progress Notes (Signed)
TCTS BRIEF SICU PROGRESS NOTE  9 Days Post-Op  S/P Procedure(s) (LRB): CORONARY ARTERY BYPASS GRAFTING (CABG) x 2 WITH ENDOSCOPIC HARVESTING OF RIGHT SAPHENOUS VEIN. (N/A) TRANSESOPHAGEAL ECHOCARDIOGRAM (TEE) (N/A)   Stable day although extremely weak Afib w/ HR 90 stable BP O2 sats 98-100% on 2 L/min  Plan: Continue current plan  Rexene Alberts, MD 08/16/2017 6:17 PM

## 2017-08-16 NOTE — Progress Notes (Addendum)
ANTICOAGULATION CONSULT NOTE  Pharmacy Consult:  Heparin Indication: atrial fibrillation  Allergies  Allergen Reactions  . Lipitor [Atorvastatin] Other (See Comments)    Severe constipation    Patient Measurements: Height: 5\' 7"  (170.2 cm) Weight: 182 lb 1.6 oz (82.6 kg) IBW/kg (Calculated) : 66.1 Heparin Dosing Weight: 83 kg  Vital Signs: Temp: 98.6 F (37 C) (09/23 0500) Temp Source: Axillary (09/23 0000) BP: 84/47 (09/23 0500) Pulse Rate: 102 (09/23 0500)  Labs:  Recent Labs  08/13/17 1534  08/14/17 0020 08/14/17 0259  08/15/17 0350 08/15/17 1413 08/15/17 1740 08/15/17 2007 08/16/17 0410 08/16/17 0429  HGB 8.8*  --   --  9.2*  --   --   --   --   --   --   --   HCT 26.0*  --   --  26.9*  --   --   --   --   --   --   --   PLT  --   --   --  108*  --   --   --   --   --   --   --   APTT  --   --  134*  --   --   --   --   --   --   --   --   HEPARINUNFRC  --   < > 0.40 0.53  < > 0.21* 0.14*  --  0.26*  --  0.28*  CREATININE 3.50*  --   --  2.40*  < > 1.53*  --  1.38*  --  1.27*  --   < > = values in this interval not displayed.  Estimated Creatinine Clearance: 46.1 mL/min (A) (by C-G formula based on SCr of 1.27 mg/dL (H)).  Assessment: 55 YOM with history of PAF on Eliquis PTA, last dose 07/31/17 at 1800.  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 CBC stable and negative HIT Ab and SRA, therefore, heparin bridge started 08/13/17.   Heparin level slightly below goal at 0.28. No infusion issues per RN. Will increase heparin gtt conservatively. CBC stable, no bleeding noted.      Goal of Therapy:  Heparin level 0.3 - 0.4 per TCTS APTT 66-88 sec Monitor platelets by anticoagulation protocol: Yes   Plan:  Increase heparin gtt to 1250 units/hr  Heparin level in 8 hrs Daily heparin level and CBC Monitor for s/s bleeding   Argie Ramming, PharmD Clinical Pharmacist 08/16/17 5:22 AM

## 2017-08-16 NOTE — Procedures (Signed)
Admit: 07/31/2017 LOS: 84  60M wih AoCKD3 s/p 2V CABG 9/14, aspiration events 9/18  Current CRRT Prescription: Start Date: 08/13/17 Catheter: L [place] Temp HD cath placed TCTS 08/12/17 BFR: 200 Pre Blood Pump: 500 4K DFR: 1000 4K Replacement Rate: 300 4K Goal UF: 166mL net neg/hr Anticoagulation: systemic heparin Clotting: none since starting  S: On amio Still on milrinone Rec TPN Similar low UOP again today Tol 182mL/hr net negative Low Phos this AM  O: 09/22 0701 - 09/23 0700 In: 2596.5 [I.V.:2088.5; IV VFIEPPIRJ:188] Out: 5023 [Urine:311]  Filed Weights   08/14/17 0430 08/15/17 0600 08/16/17 0325  Weight: 84.6 kg (186 lb 8.2 oz) 83 kg (182 lb 15.7 oz) 82.6 kg (182 lb 1.6 oz)     Recent Labs Lab 08/15/17 0350 08/15/17 1740 08/16/17 0410 08/16/17 0724  NA 135 136 133* 129*  K 3.7 3.4* 3.7 3.5  CL 104 104 103 99*  CO2 24 24 23 23   GLUCOSE 116* 122* 126* 535*  BUN 18 17 16 15   CREATININE 1.53* 1.38* 1.27* 1.30*  CALCIUM 8.0* 8.0* 8.2* 8.0*  PHOS 1.4* 2.6  --  1.2*    Recent Labs Lab 08/10/17 0846 08/10/17 1559  08/12/17 0346  08/13/17 0223 08/13/17 1534 08/14/17 0259  WBC 13.6* 15.5*  < > 9.0  --  9.8  --  7.9  NEUTROABS 11.9* 13.4*  --   --   --   --   --  6.2  HGB 9.0* 9.3*  < > 8.5*  < > 9.1* 8.8* 9.2*  HCT 25.9* 27.5*  < > 24.6*  < > 26.0* 26.0* 26.9*  MCV 98.9 99.3  < > 98.0  --  96.7  --  97.8  PLT 120* 134*  < > 129*  --  129*  --  108*  < > = values in this interval not displayed.  Scheduled Meds: . allopurinol  200 mg Oral Daily  . aspirin EC  325 mg Oral Daily   Or  . aspirin  324 mg Per Tube Daily  . bisacodyl  10 mg Oral Daily   Or  . bisacodyl  10 mg Rectal Daily  . chlorhexidine  15 mL Mouth Rinse BID  . Chlorhexidine Gluconate Cloth  6 each Topical Q0600  . docusate sodium  200 mg Oral Daily  . finasteride  5 mg Oral Daily  . insulin aspart  0-24 Units Subcutaneous Q6H  . mouth rinse  15 mL Mouth Rinse q12n4p  . metoprolol  tartrate  5 mg Intravenous Q6H  . pantoprazole (PROTONIX) IV  40 mg Intravenous Q24H  . rosuvastatin  10 mg Oral q1800  . sodium chloride flush  10-40 mL Intracatheter Q12H  . sodium chloride flush  10-40 mL Intracatheter Q12H  . sodium chloride flush  3 mL Intravenous Q12H   Continuous Infusions: . sodium chloride Stopped (08/08/17 0900)  . sodium chloride    . sodium chloride Stopped (08/15/17 0800)  . sodium chloride    . sodium chloride    . amiodarone 30 mg/hr (08/16/17 0700)  . heparin 1,250 Units/hr (08/16/17 0700)  . lactated ringers 10 mL/hr at 08/10/17 0800  . milrinone 0.125 mcg/kg/min (08/16/17 0807)  . phenylephrine (NEO-SYNEPHRINE) Adult infusion Stopped (08/13/17 1300)  . piperacillin-tazobactam (ZOSYN)  IV Stopped (08/16/17 4166)  . potassium chloride    . dialysis replacement fluid (prismasate) 500 mL/hr at 08/15/17 1506  . dialysis replacement fluid (prismasate) 300 mL/hr at 08/15/17 2147  . dialysate (PRISMASATE)  1,000 mL/hr at 08/16/17 0809  . sodium phosphate  Dextrose 5% IVPB    . TPN (CLINIMIX) Adult without lytes 60 mL/hr at 08/16/17 0700  . TPN (CLINIMIX) Adult without lytes     PRN Meds:.sodium chloride, Place/Maintain arterial line **AND** sodium chloride, Place/Maintain arterial line **AND** sodium chloride, fentaNYL (SUBLIMAZE) injection, heparin, heparin, levalbuterol, metoprolol tartrate, ondansetron (ZOFRAN) IV, RESOURCE THICKENUP CLEAR, sodium chloride flush, sodium chloride flush, sodium chloride flush, white petrolatum  ABG    Component Value Date/Time   PHART 7.499 (H) 08/16/2017 0438   PCO2ART 32.9 08/16/2017 0438   PO2ART 56.0 (L) 08/16/2017 0438   HCO3 25.6 08/16/2017 0438   TCO2 27 08/16/2017 0438   ACIDBASEDEF 2.8 (H) 08/13/2017 0430   O2SAT 62.1 08/16/2017 0441    A/P  1. Dialysis dependent AoCKD3, oliguric, baseline SCr in 1.5-2.0; likely ATN post CABG 2. CAD s/p 2V CABG 08/07/17; rmeains on milrinone  3. BPH on outpt finasteride,  foley present currently 4. AFib with RVR, on amio / heparin systemic 5. Chronic mild TCP, seen by [place], ?ITP 6. Hypophosphatemia on CRRT  P  Cont CRRT, inc to 164mL net neg, all 4k, no circuit heparin, 500/1000/300 flow.   Replete Phos  I suspect can transition to iHD / monitoring in next 24-48h  Pearson Grippe, MD [employer] pgr [phone]

## 2017-08-16 NOTE — Progress Notes (Signed)
ABG drawn 08/16/2017 @0426  is not accurate as it was venous bld.

## 2017-08-16 NOTE — Progress Notes (Signed)
[place] NOTE   Pharmacy Consult for TPN Indication: Ileus  Patient Measurements: Height: 5\' 7"  (170.2 cm) Weight: 182 lb 1.6 oz (82.6 kg) IBW/kg (Calculated) : 66.1 TPN AdjBW (KG): 76.6 Body mass index is 28.52 kg/m.  Assessment: 83 yo M with CABG x 2 on 9/14 now with acute renal failure and post-op ileus per Dr Prescott Gum.  PMH: CAD, HLD, PVD, AFib, CKD 3, HTN, diastolic HF  GI: Abdominal Xray on 9/21 does not suggest bowel dilatation. Had BMs on 9/20 and 9/21. Albumin low at 2.4. Prelabumin low at 7.1. Ileus seems to be resolved, CVTS aware and would like patient to be able to swallow safely before starting TFs to reduce the risk of aspiration with tenuous pulmonary status. Although speech did not see due to ileus. Endo: No hx of DM. CBGs < 140s. SSI Insulin requirements in the past 24 hours: 5 Lytes: wnl, K 3.5(Given some K runs in the evening). Phos down again to 1.2. CoCa 9.3. Mg ok Renal: ARF. Continues on CRRT 9/20 >>. Using all 4K baths. Making a little bit of urine Pulm: Bipap Cards: S/p cabg in afib on milrinone. BP and HR ok. Neo and dopamine weaned off. Hepatobil: LFTs WNL. Tbili wnl. Trig wnl Neuro: no issues ID: [email or web address] for possible PNA  TPN Access: PICC TPN start date: 9/20  Nutritional Goals (per RD recommendation on 9/20): KCal: 1800 - 2000 / day Protein: 120 - 150 g / day  Goal Clinimix 5/15 @100ml /hr with IVFE on MWF  Current Nutrition: NPO, TPN  Plan:  Increase Clinimix 5/15 (no lytes) to 83 ml/hr Hold 20% lipid emulsion for first 7 days for ICU patients per ASPEN guidelines (Start date 9/27) Continue NS at 54ml/hr per MD Continue LR at 71ml/hr per MD This provides 100 g of protein and 1414 kCals per day meeting ~75% of protein and kCal needs. Will continue to increase to goal as tolerated. Add MVI and trace elements in TPN Continue resistant SSI Q6h and adjust as needed Monitor TPN labs, renal function  panel F/U ability to trial tube feeds per CVTS  Give another 57mmol of sodium phosphate this am Give potassium 51mEq IV x 3 runs  Elenor Quinones, PharmD, [facility] Clinical Pharmacist Pager [phone] 08/16/2017 7:08 AM

## 2017-08-16 NOTE — Progress Notes (Signed)
O2 applied at [facility] due to results of ABG.

## 2017-08-16 NOTE — Progress Notes (Signed)
      [address]       [name] [address]             [phone]        CARDIOTHORACIC SURGERY PROGRESS NOTE   R9 Days Post-Op Procedure(s) (LRB): CORONARY ARTERY BYPASS GRAFTING (CABG) x 2 WITH ENDOSCOPIC HARVESTING OF RIGHT SAPHENOUS VEIN. (N/A) TRANSESOPHAGEAL ECHOCARDIOGRAM (TEE) (N/A)  Subjective: Denies pain or SOB  Objective: Vital signs: BP Readings from Last 1 Encounters:  08/16/17 104/64   Pulse Readings from Last 1 Encounters:  08/16/17 (!) 106   Resp Readings from Last 1 Encounters:  08/16/17 (!) 25   Temp Readings from Last 1 Encounters:  08/16/17 97.9 F (36.6 C)    Hemodynamics: CVP:  [0 mmHg-7 mmHg] 2 mmHg  Mixed venous co-ox 62%   Physical Exam:  Rhythm:   Afib w/ controlled rate  Breath sounds: Diminished at bases  Heart sounds:  irregular  Incisions:  Clean and dry  Abdomen:  Soft, non-distended, non-tender  Extremities:  Warm, well-perfused   Intake/Output from previous day: 09/22 0701 - 09/23 0700 In: 2596.5 [I.V.:2088.5; IV Piggyback:508] Out: 5023 [Urine:311] Intake/Output this shift: Total I/O In: -  Out: 826 [Other:826]  Lab Results:  CBC: Recent Labs  08/13/17 1534 08/14/17 0259  WBC  --  7.9  HGB 8.8* 9.2*  HCT 26.0* 26.9*  PLT  --  108*    BMET:  Recent Labs  08/16/17 0724 08/16/17 0825  NA 129* 133*  K 3.5 3.4*  CL 99* 103  CO2 23 22  GLUCOSE 535* 144*  BUN 15 15  CREATININE 1.30* 1.25*  CALCIUM 8.0* 8.1*     PT/INR:  No results for input(s): LABPROT, INR in the last 72 hours.  CBG (last 3)   Recent Labs  08/15/17 1820 08/16/17 0001 08/16/17 0836  GLUCAP 95 127* 111*    ABG    Component Value Date/Time   PHART 7.499 (H) 08/16/2017 0438   PCO2ART 32.9 08/16/2017 0438   PO2ART 56.0 (L) 08/16/2017 0438   HCO3 25.6 08/16/2017 0438   TCO2 27 08/16/2017 0438   ACIDBASEDEF 2.8 (H) 08/13/2017 0430   O2SAT 62.1 08/16/2017 0441    CXR: n/a  Assessment/Plan: S/P  Procedure(s) (LRB): CORONARY ARTERY BYPASS GRAFTING (CABG) x 2 WITH ENDOSCOPIC HARVESTING OF RIGHT SAPHENOUS VEIN. (N/A) TRANSESOPHAGEAL ECHOCARDIOGRAM (TEE) (N/A)  Remains in rate-controlled Afib w/ stable BP on low dose milrinone, now back on amiodarone, co-ox 62% Breathing comfortably w/ O2 sats 99-100% on 2 L/min ARF tolerating CVVHD -2.4 liters yesterday - per Nephrology Hypokalemia, being replaced Tolerating TNA, awaiting swallowing eval Severe physical deconditioning   Rexene Alberts, MD 08/16/2017 11:07 AM

## 2017-08-16 NOTE — Progress Notes (Signed)
ANTICOAGULATION CONSULT NOTE  Pharmacy Consult:  Heparin Indication: atrial fibrillation  Allergies  Allergen Reactions  . Lipitor [Atorvastatin] Other (See Comments)    Severe constipation    Patient Measurements: Height: 5\' 7"  (170.2 cm) Weight: 182 lb 1.6 oz (82.6 kg) IBW/kg (Calculated) : 66.1 Heparin Dosing Weight: 83 kg  Vital Signs: Temp: 97.9 F (36.6 C) (09/23 1500) Temp Source: Core (Comment) (09/23 1000) BP: 116/46 (09/23 1400) Pulse Rate: 91 (09/23 1500)  Labs:  Recent Labs  08/13/17 1534  08/14/17 0020 08/14/17 0259  08/15/17 2007 08/16/17 0410 08/16/17 0429 08/16/17 0724 08/16/17 0825 08/16/17 1322  HGB 8.8*  --   --  9.2*  --   --   --   --   --   --   --   HCT 26.0*  --   --  26.9*  --   --   --   --   --   --   --   PLT  --   --   --  108*  --   --   --   --   --   --   --   APTT  --   --  134*  --   --   --   --   --   --   --   --   HEPARINUNFRC  --   < > 0.40 0.53  < > 0.26*  --  0.28*  --   --  0.31  CREATININE 3.50*  --   --  2.40*  < >  --  1.27*  --  1.30* 1.25*  --   < > = values in this interval not displayed.  Estimated Creatinine Clearance: 46.9 mL/min (A) (by C-G formula based on SCr of 1.25 mg/dL (H)).  Assessment: 79 YOM with history of PAF on Eliquis PTA, last dose 07/31/17 at 1800.  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 CBC stable and negative HIT Ab and SRA, therefore, heparin bridge started 08/13/17.   Heparin level slightly at goal at 0.31 heparin driop rate 1250 uts/hr. No infusion issues per RN.CBC stable, no bleeding noted.      Goal of Therapy:  Heparin level 0.3 - 0.4 per TCTS APTT 66-88 sec Monitor platelets by anticoagulation protocol: Yes   Plan:  Continue heparin gtt to 1250 units/hr  Daily heparin level and CBC Monitor for s/s bleeding   Bonnita Nasuti Pharm.D. CPP, BCPS Clinical Pharmacist [phone] 08/16/2017 3:04 PM

## 2017-08-17 LAB — DIFFERENTIAL
Basophils Absolute: 0 10*3/uL (ref 0.0–0.1)
Basophils Relative: 0 %
Eosinophils Absolute: 0.1 10*3/uL (ref 0.0–0.7)
Eosinophils Relative: 1 %
Lymphocytes Relative: 14 %
Lymphs Abs: 1.9 10*3/uL (ref 0.7–4.0)
Monocytes Absolute: 1.1 10*3/uL — ABNORMAL HIGH (ref 0.1–1.0)
Monocytes Relative: 8 %
Neutro Abs: 10 10*3/uL — ABNORMAL HIGH (ref 1.7–7.7)
Neutrophils Relative %: 77 %

## 2017-08-17 LAB — COOXEMETRY PANEL
Carboxyhemoglobin: 1 % (ref 0.5–1.5)
Carboxyhemoglobin: 0.9 % (ref 0.5–1.5)
Methemoglobin: 0.7 % (ref 0.0–1.5)
Methemoglobin: 1 % (ref 0.0–1.5)
O2 Saturation: 35.9 %
O2 Saturation: 58.7 %
Total hemoglobin: 11.2 g/dL — ABNORMAL LOW (ref 12.0–16.0)
Total hemoglobin: 11.1 g/dL — ABNORMAL LOW (ref 12.0–16.0)

## 2017-08-17 LAB — RENAL FUNCTION PANEL
ANION GAP: 14 (ref 5–15)
Albumin: 2.4 g/dL — ABNORMAL LOW (ref 3.5–5.0)
BUN: 32 mg/dL — ABNORMAL HIGH (ref 6–20)
CHLORIDE: 100 mmol/L — AB (ref 101–111)
CO2: 18 mmol/L — ABNORMAL LOW (ref 22–32)
Calcium: 8 mg/dL — ABNORMAL LOW (ref 8.9–10.3)
Creatinine, Ser: 1.96 mg/dL — ABNORMAL HIGH (ref 0.61–1.24)
GFR, EST AFRICAN AMERICAN: 35 mL/min — AB (ref >60)
GFR, EST NON AFRICAN AMERICAN: 30 mL/min — AB (ref >60)
Glucose, Bld: 143 mg/dL — ABNORMAL HIGH (ref 65–99)
PHOSPHORUS: 6 mg/dL — AB (ref 2.5–4.6)
POTASSIUM: 3.2 mmol/L — AB (ref 3.5–5.1)
Sodium: 132 mmol/L — ABNORMAL LOW (ref 135–145)

## 2017-08-17 LAB — GLUCOSE, CAPILLARY
GLUCOSE-CAPILLARY: 114 mg/dL — AB (ref 65–99)
Glucose-Capillary: 93 mg/dL (ref 65–99)
Glucose-Capillary: 123 mg/dL — ABNORMAL HIGH (ref 65–99)

## 2017-08-17 LAB — BLOOD GAS, ARTERIAL
Acid-base deficit: 1.8 mmol/L (ref 0.0–2.0)
Bicarbonate: 20.1 mmol/L (ref 20.0–28.0)
Drawn by: 418751
O2 Content: 3 L/min
O2 Saturation: 97.6 %
Patient temperature: 98.6
pCO2 arterial: 21.3 mmHg — ABNORMAL LOW (ref 32.0–48.0)
pH, Arterial: 7.581 — ABNORMAL HIGH (ref 7.350–7.450)
pO2, Arterial: 91.5 mmHg (ref 83.0–108.0)

## 2017-08-17 LAB — CBC
HCT: 33.2 % — ABNORMAL LOW (ref 39.0–52.0)
Hemoglobin: 11.1 g/dL — ABNORMAL LOW (ref 13.0–17.0)
MCH: 33.2 pg (ref 26.0–34.0)
MCHC: 33.4 g/dL (ref 30.0–36.0)
MCV: 99.4 fL (ref 78.0–100.0)
Platelets: 162 10*3/uL (ref 150–400)
RBC: 3.34 MIL/uL — ABNORMAL LOW (ref 4.22–5.81)
RDW: 16.5 % — ABNORMAL HIGH (ref 11.5–15.5)
WBC: 13.1 10*3/uL — ABNORMAL HIGH (ref 4.0–10.5)

## 2017-08-17 LAB — COMPREHENSIVE METABOLIC PANEL
ALT: 28 U/L (ref 17–63)
AST: 40 U/L (ref 15–41)
Albumin: 2.6 g/dL — ABNORMAL LOW (ref 3.5–5.0)
Alkaline Phosphatase: 76 U/L (ref 38–126)
Anion gap: 10 (ref 5–15)
BUN: 22 mg/dL — ABNORMAL HIGH (ref 6–20)
CO2: 22 mmol/L (ref 22–32)
Calcium: 7.5 mg/dL — ABNORMAL LOW (ref 8.9–10.3)
Chloride: 101 mmol/L (ref 101–111)
Creatinine, Ser: 1.42 mg/dL — ABNORMAL HIGH (ref 0.61–1.24)
GFR calc Af Amer: 52 mL/min — ABNORMAL LOW (ref >60)
GFR calc non Af Amer: 44 mL/min — ABNORMAL LOW (ref >60)
Glucose, Bld: 126 mg/dL — ABNORMAL HIGH (ref 65–99)
Potassium: 3.9 mmol/L (ref 3.5–5.1)
Sodium: 133 mmol/L — ABNORMAL LOW (ref 135–145)
Total Bilirubin: 1.1 mg/dL (ref 0.3–1.2)
Total Protein: 6.2 g/dL — ABNORMAL LOW (ref 6.5–8.1)

## 2017-08-17 LAB — POCT I-STAT, CHEM 8
BUN: 30 mg/dL — AB (ref 6–20)
CREATININE: 1.9 mg/dL — AB (ref 0.61–1.24)
Calcium, Ion: 0.99 mmol/L — ABNORMAL LOW (ref 1.15–1.40)
Chloride: 100 mmol/L — ABNORMAL LOW (ref 101–111)
GLUCOSE: 146 mg/dL — AB (ref 65–99)
HEMATOCRIT: 30 % — AB (ref 39.0–52.0)
Hemoglobin: 10.2 g/dL — ABNORMAL LOW (ref 13.0–17.0)
Potassium: 3.3 mmol/L — ABNORMAL LOW (ref 3.5–5.1)
Sodium: 134 mmol/L — ABNORMAL LOW (ref 135–145)
TCO2: 20 mmol/L — AB (ref 22–32)

## 2017-08-17 LAB — PHOSPHORUS: Phosphorus: 2 mg/dL — ABNORMAL LOW (ref 2.5–4.6)

## 2017-08-17 LAB — MAGNESIUM: Magnesium: 1.8 mg/dL (ref 1.7–2.4)

## 2017-08-17 LAB — HEPARIN LEVEL (UNFRACTIONATED): Heparin Unfractionated: 0.33 IU/mL (ref 0.30–0.70)

## 2017-08-17 LAB — TRIGLYCERIDES: Triglycerides: 104 mg/dL (ref <150)

## 2017-08-17 LAB — PREALBUMIN: Prealbumin: 14.3 mg/dL — ABNORMAL LOW (ref 18–38)

## 2017-08-17 MED ORDER — POTASSIUM CHLORIDE 10 MEQ/50ML IV SOLN
10.0000 meq | Freq: Once | INTRAVENOUS | Status: AC
  Administered 2017-08-17: 10 meq via INTRAVENOUS
  Filled 2017-08-17: qty 50

## 2017-08-17 MED ORDER — FENTANYL 12 MCG/HR TD PT72
12.5000 ug | MEDICATED_PATCH | TRANSDERMAL | Status: DC
  Administered 2017-08-17 – 2017-08-20 (×2): 12.5 ug via TRANSDERMAL
  Filled 2017-08-17 (×3): qty 1

## 2017-08-17 MED ORDER — SODIUM PHOSPHATES 45 MMOLE/15ML IV SOLN
30.0000 mmol | Freq: Once | INTRAVENOUS | Status: AC
  Administered 2017-08-17: 30 mmol via INTRAVENOUS
  Filled 2017-08-17: qty 10

## 2017-08-17 MED ORDER — M.V.I. ADULT IV INJ
INJECTION | INTRAVENOUS | Status: AC
  Administered 2017-08-17: 18:00:00 via INTRAVENOUS
  Filled 2017-08-17: qty 1200

## 2017-08-17 MED ORDER — FUROSEMIDE 10 MG/ML IJ SOLN
80.0000 mg | Freq: Two times a day (BID) | INTRAMUSCULAR | Status: DC
  Administered 2017-08-17 – 2017-08-21 (×10): 80 mg via INTRAVENOUS
  Filled 2017-08-17 (×11): qty 8

## 2017-08-17 NOTE — Progress Notes (Signed)
Subjective:  CRRT running well- hemodynamically good on milrinone and amio- CVP 3-4- 110 UOP yest- 60 so far today.  With CRRT is a little over 2 liters neg Objective Vital signs in last 24 hours: Vitals:   08/17/17 0400 08/17/17 0500 08/17/17 0600 08/17/17 0700  BP: (!) 111/45 (!) 102/46 (!) 86/48 (!) 117/43  Pulse: (!) 106 (!) 107 100 (!) 108  Resp: 15 17 (!) 24 (!) 25  Temp:      TempSrc:      SpO2: 100% 100% 100% 100%  Weight:  79.4 kg (175 lb 0.7 oz)    Height:       Weight change: -3.2 kg (-7 lb 0.9 oz)  Intake/Output Summary (Last 24 hours) at 08/17/17 0809 Last data filed at 08/17/17 0700  Gross per 24 hour  Intake           3235.8 ml  Output             5648 ml  Net          -2412.2 ml    Assessment/ Plan: Pt is a 83 y.o. yo male who was admitted on 07/31/2017 with  POD 10 from CABG- c/b A on CRF  Assessment/Plan: 1. Renal- dialysis dependant A on CRF - baseline crt 1.5-2.  AKI likely due to ATN after CABG- remains oligoanuric- on CRRT since 9/20- will stop and monitor since BP looks pretty good.  However, I am not sure of renal function prognosis- I dont think he can comprehend what this may mean for him, will need to speak with daughters  2. HTN/vol- has been tolerating UF at 100 per hour, around 2200 per 24 hours- not too overloaded- since stopping CRRT will add diuretic 3. Anemia- hgb over 11- no ESA 4. Secondary hyperparathyroidism- phos if anything low- replete again   Cera Rorke A    Labs: Basic Metabolic Panel:  Recent Labs Lab 08/16/17 0825 08/16/17 1536 08/17/17 0501  NA 133* 133* 133*  K 3.4* 3.8 3.9  CL 103 103 101  CO2 22 22 22   GLUCOSE 144* 145* 126*  BUN 15 18 22*  CREATININE 1.25* 1.27* 1.42*  CALCIUM 8.1* 8.2* 7.5*  PHOS 1.2* 2.5 2.0*   Liver Function Tests:  Recent Labs Lab 08/15/17 0350  08/16/17 0410  08/16/17 0825 08/16/17 1536 08/17/17 0501  AST 26  --  26  --   --   --  40  ALT 22  --  23  --   --   --  28   ALKPHOS 60  --  64  --   --   --  76  BILITOT 1.2  --  0.9  --   --   --  1.1  PROT 5.1*  --  5.7*  --   --   --  6.2*  ALBUMIN 2.2*  < > 2.4*  < > 2.3* 2.5* 2.6*  < > = values in this interval not displayed. No results for input(s): LIPASE, AMYLASE in the last 168 hours. No results for input(s): AMMONIA in the last 168 hours. CBC:  Recent Labs Lab 08/10/17 1559  08/11/17 0320  08/12/17 0346  08/13/17 0223 08/13/17 1534 08/14/17 0259 08/17/17 0501  WBC 15.5*  --  12.1*  --  9.0  --  9.8  --  7.9 13.1*  NEUTROABS 13.4*  --   --   --   --   --   --   --  6.2 10.0*  HGB 9.3*  < > 8.8*  < > 8.5*  < > 9.1* 8.8* 9.2* 11.1*  HCT 27.5*  < > 25.4*  < > 24.6*  < > 26.0* 26.0* 26.9* 33.2*  MCV 99.3  --  97.7  --  98.0  --  96.7  --  97.8 99.4  PLT 134*  --  123*  --  129*  --  129*  --  108* 162  < > = values in this interval not displayed. Cardiac Enzymes: No results for input(s): CKTOTAL, CKMB, CKMBINDEX, TROPONINI in the last 168 hours. CBG:  Recent Labs Lab 08/16/17 0001 08/16/17 0836 08/16/17 1224 08/16/17 1646 08/17/17 0010  GLUCAP 127* 111* 132* 85 114*    Iron Studies: No results for input(s): IRON, TIBC, TRANSFERRIN, FERRITIN in the last 72 hours. Studies/Results: Dg Chest Port 1 View  Result Date: 08/16/2017 CLINICAL DATA:  Congestive heart failure EXAM: PORTABLE CHEST 1 VIEW COMPARISON:  08/15/2017 FINDINGS: Mild improvement in congestive heart failure with edema. Bilateral effusions left greater than right unchanged. Bibasilar atelectasis left greater than right unchanged. Left arm PICC tip in the lower SVC unchanged. Left subclavian dual lumen central venous catheter tip in the left innominate vein unchanged. IMPRESSION: Mild improvement in congestive heart failure and edema. Bibasilar atelectasis and effusion left greater than right Electronically Signed   By: Franchot Gallo M.D.   On: 08/16/2017 14:48   Medications: Infusions: . sodium chloride Stopped (08/08/17  0900)  . sodium chloride    . sodium chloride 10 mL/hr at 08/17/17 0000  . sodium chloride    . sodium chloride    . amiodarone 30 mg/hr (08/17/17 0629)  . heparin 1,250 Units/hr (08/17/17 0630)  . lactated ringers 10 mL/hr at 08/10/17 0800  . milrinone 0.125 mcg/kg/min (08/16/17 1900)  . piperacillin-tazobactam (ZOSYN)  IV 2.25 g (08/17/17 0551)  . dialysis replacement fluid (prismasate) 500 mL/hr at 08/16/17 2253  . dialysis replacement fluid (prismasate) 300 mL/hr at 08/16/17 0933  . dialysate (PRISMASATE) 1,000 mL/hr at 08/17/17 0215  . TPN (CLINIMIX) Adult without lytes 83 mL/hr at 08/16/17 1803    Scheduled Medications: . allopurinol  200 mg Oral Daily  . aspirin EC  325 mg Oral Daily   Or  . aspirin  324 mg Per Tube Daily  . bisacodyl  10 mg Oral Daily   Or  . bisacodyl  10 mg Rectal Daily  . chlorhexidine  15 mL Mouth Rinse BID  . Chlorhexidine Gluconate Cloth  6 each Topical Q0600  . docusate sodium  200 mg Oral Daily  . finasteride  5 mg Oral Daily  . insulin aspart  0-24 Units Subcutaneous Q6H  . mouth rinse  15 mL Mouth Rinse q12n4p  . metoprolol tartrate  5 mg Intravenous Q6H  . pantoprazole (PROTONIX) IV  40 mg Intravenous Q24H  . rosuvastatin  10 mg Oral q1800  . sodium chloride flush  10-40 mL Intracatheter Q12H  . sodium chloride flush  10-40 mL Intracatheter Q12H  . sodium chloride flush  3 mL Intravenous Q12H    have reviewed scheduled and prn medications.  Physical Exam: General: communicate via writing board- he is c/o back pain and not really concerned about anything else Heart: tachy Lungs: prolonged exp phase Abdomen: soft, non tender Extremities: minimal edema Dialysis Access: vascath placed 9/19    08/17/2017,8:09 AM  LOS: 17 days

## 2017-08-17 NOTE — Progress Notes (Signed)
Subjective:  C/o back pain.  Difficult swallowing.  Hearing very bad and difficult to communicate.  Was in intermittent sinus on Friday but afib currently.   Objective:  Vital Signs in the last 24 hours: BP (!) 102/55   Pulse (!) 102   Temp 97.9 F (36.6 C) (Axillary)   Resp (!) 25   Ht 5\' 7"  (1.702 m)   Wt 79.4 kg (175 lb 0.7 oz)   SpO2 100%   BMI 27.42 kg/m   Physical Exam: Elderly WM in NAD, HOH Lungs:  Reduced BS Cardiac:  irregular rhythm, normal S1 and S2, no S3 Extremities:  1+ edema present  Intake/Output from previous day: 09/23 0701 - 09/24 0700 In: 3315.5 [I.V.:2538.5; IV Piggyback:777] Out: 5949 [Urine:110] Weight Filed Weights   08/15/17 0600 08/16/17 0325 08/17/17 0500  Weight: 83 kg (182 lb 15.7 oz) 82.6 kg (182 lb 1.6 oz) 79.4 kg (175 lb 0.7 oz)    Lab Results: Basic Metabolic Panel:  Recent Labs  08/16/17 1536 08/17/17 0501  NA 133* 133*  K 3.8 3.9  CL 103 101  CO2 22 22  GLUCOSE 145* 126*  BUN 18 22*  CREATININE 1.27* 1.42*    CBC:  Recent Labs  08/17/17 0501  WBC 13.1*  NEUTROABS 10.0*  HGB 11.1*  HCT 33.2*  MCV 99.4  PLT 162    BNP    Component Value Date/Time   BNP 560.8 (H) 08/01/2017 0057    Telemetry: atrial fibrillation with somewhat rapidresponse.   Assessment/Plan:  1. Recent CABG 2. ARF on top of chronic but some UOP now CRT being stopped today 3. Prior stroke and dysphagia 4. Atrial fibrillation    He DOES NOT HAVE  chronic atrial fib but paroxysmal.   He is on metoprolol IV since trouble swallowing.  I would continue IV amiodarone.  A fib rate up some today.  I think he will do better in sinus but since was intermittent before I would hold off of cardioversion.        Kerry Hough  MD [facility] Card[facility]  08/17/2017, 9:03 AM

## 2017-08-17 NOTE — Progress Notes (Signed)
Patient ID: Stanley Robles, male   DOB: [birth year], 83 y.o.   MRN: [id number] EVENING ROUNDS NOTE :     [address]       [place] [address]             [phone]                 10 Days Post-Op Procedure(s) (LRB): CORONARY ARTERY BYPASS GRAFTING (CABG) x 2 WITH ENDOSCOPIC HARVESTING OF RIGHT SAPHENOUS VEIN. (N/A) TRANSESOPHAGEAL ECHOCARDIOGRAM (TEE) (N/A)  Total Length of Stay:  LOS: 17 days  BP 121/79   Pulse (!) 117   Temp (!) 97.4 F (36.3 C) (Oral)   Resp (!) 30   Ht 5\' 7"  (1.702 m)   Wt 175 lb 0.7 oz (79.4 kg)   SpO2 (!) 76%   BMI 27.42 kg/m   .Intake/Output      09/24 0701 - 09/25 0700   I.V. (mL/kg) 1086.5 (13.7)   IV Piggyback 410   Total Intake(mL/kg) 1496.5 (18.8)   Urine (mL/kg/hr) 205 (0.2)   Other 471   Total Output 676   Net +820.5         . sodium chloride Stopped (08/08/17 0900)  . sodium chloride    . sodium chloride 10 mL/hr at 08/17/17 0000  . sodium chloride    . sodium chloride    . amiodarone 30 mg/hr (08/17/17 0629)  . heparin 1,250 Units/hr (08/17/17 0630)  . lactated ringers 10 mL/hr at 08/10/17 0800  . milrinone 0.125 mcg/kg/min (08/17/17 1616)  . piperacillin-tazobactam (ZOSYN)  IV Stopped (08/17/17 1801)  . potassium chloride 10 mEq (08/17/17 1815)  . TPN (CLINIMIX) Adult without lytes 50 mL/hr at 08/17/17 1759     Lab Results  Component Value Date   WBC 13.1 (H) 08/17/2017   HGB 10.2 (L) 08/17/2017   HCT 30.0 (L) 08/17/2017   PLT 162 08/17/2017   GLUCOSE 143 (H) 08/17/2017   CHOL 162 02/28/2015   TRIG 104 08/17/2017   HDL 31 (L) 02/28/2015   LDLCALC 102 (H) 02/28/2015   ALT 28 08/17/2017   AST 40 08/17/2017   NA 132 (L) 08/17/2017   K 3.2 (L) 08/17/2017   CL 100 (L) 08/17/2017   CREATININE 1.96 (H) 08/17/2017   BUN 32 (H) 08/17/2017   CO2 18 (L) 08/17/2017   TSH 3.035 08/04/2017   INR 1.48 08/07/2017   HGBA1C 5.3 08/04/2017   Off cvvh [place] today with help   Grace Isaac MD  Beeper  [phone] Office [phone] 08/17/2017 7:04 PM

## 2017-08-17 NOTE — Progress Notes (Signed)
Stanley Robles NOTE   Pharmacy Consult for TPN Indication: Ileus  Patient Measurements: Height: 5\' 7"  (170.2 cm) Weight: 175 lb 0.7 oz (79.4 kg) IBW/kg (Calculated) : 66.1 TPN AdjBW (KG): 76.6 Body mass index is 27.42 kg/m.  Assessment: 83 yo M with CABG x 2 on 9/14 now with acute renal failure and post-op ileus per Dr Prescott Gum.  PMH: CAD, HLD, PVD, AFib, CKD 3, HTN, diastolic HF  GI: Abdominal Xray on 9/21 does not suggest bowel dilatation. Had BMs on 9/20 and 9/21. Albumin low at 2.6. Prelabumin improved 14.3. Ileus seems to be resolved, CVTS aware and would like patient to be able to swallow safely before starting TFs to reduce the risk of aspiration with tenuous pulmonary status. Although speech did not see due to ileus. CVTS decreased rate 9/24 AM w/ CRRT off - per discussion, would like to have 1 more TPN bag at this rate tonight with swallow eval pending Endo: No hx of DM. CBGs controlled. SSI Insulin requirements in the past 24 hours: 2 units Lytes: Na 133 stable, K 3.9 (s/p k runs x 3). Phos down again to 2 (s/p 57mmol Naphos + 83mmol Kphos). CoCa 8.4. Mg 1.8 Renal: ARF. CRRT 9/20 >> stopped 9/24. Making a little bit of urine. NS and LR both at kvo Pulm: [place] Cards: S/p cabg in afib on amio, milrinone. BP soft, HR 100s on lopressor IV. Neo and dopamine weaned off. Heparin IV for afib Hepatobil: LFTs WNL. Tbili wnl. Trig wnl Neuro: fent prn ID: Zosyn for possible PNA. Afeb, WBC up 13.1  TPN Access: PICC TPN start date: 9/20  Nutritional Goals (per RD recommendation on 9/20): KCal: 1800 - 2000 / day Protein: 120 - 150 g / day  Goal Clinimix 5/15 @100ml /hr with IVFE on MWF  Current Nutrition: NPO, TPN  Plan:  Decrease Clinimix 5/15 (no lytes) to 50 ml/hr tonight (rate per CVTS request) - may be last bag tonight per discussion with PVT Hold 20% lipid emulsion for first 7 days for ICU patients per ASPEN guidelines (Start date  9/27) Continue NS at 38ml/hr per MD Continue LR at 94ml/hr per MD This provides 60 g of protein and 852 kCals per day - meeting 50% of protein and ~47% kCal needs Add MVI and trace elements in TPN Continue resistant SSI Q6h and adjust as needed Monitor TPN labs, renal function panel F/U swallow eval, ability to trial tube feeds per CVTS  Give another 66mmol of sodium phosphate this am - ordered per Renal  Stanley Robles, PharmD, BCPS Clinical Pharmacist Rx Phone # for today: [phone] After 3:30PM, please call Main Rx: [phone] 08/17/2017 7:56 AM

## 2017-08-17 NOTE — Progress Notes (Signed)
10 Days Post-Op Procedure(s) (LRB): CORONARY ARTERY BYPASS GRAFTING (CABG) x 2 WITH ENDOSCOPIC HARVESTING OF RIGHT SAPHENOUS VEIN. (N/A) TRANSESOPHAGEAL ECHOCARDIOGRAM (TEE) (N/A) Subjective: Acute on chronic renal failure-CRT stop today for observation Stable blood pressure and mixed venous saturation Atrial fibrillation heart rate 105 on heparin and amiodarone Out of bed to chair with physical therapy Patient nothing by mouth until swallowing evaluation is repeated-continue TPN Already status stable-has not required BiPAP in 72 hours  Objective: Vital signs in last 24 hours: Temp:  [97.4 F (36.3 C)-97.9 F (36.6 C)] 97.4 F (36.3 C) (09/24 1300) Pulse Rate:  [31-116] 106 (09/24 1711) Cardiac Rhythm: Atrial fibrillation (09/24 1600) Resp:  [12-40] 22 (09/24 1711) BP: (86-143)/(35-69) 113/57 (09/24 1711) SpO2:  [92 %-100 %] 99 % (09/24 1711) Arterial Line BP: (89-169)/(21-49) 122/29 (09/24 1300) Weight:  [175 lb 0.7 oz (79.4 kg)] 175 lb 0.7 oz (79.4 kg) (09/24 0500)  Hemodynamic parameters for last 24 hours: CVP:  [2 mmHg-6 mmHg] 4 mmHg  Intake/Output from previous day: 09/23 0701 - 09/24 0700 In: 3315.5 [I.V.:2538.5; IV Piggyback:777] Out: 1749 [Urine:110] Intake/Output this shift: Total I/O In: 1139.9 [I.V.:829.9; IV Piggyback:310] Out: 449 [Urine:175; Other:471]       Exam    General- alert and comfortable deaf, hearing aid currently not functioning   Lungs- clear without rales, wheezes   Cor- regular rate and rhythm, no murmur , gallop   Abdomen- soft, non-tender   Extremities - warm, non-tender, minimal edema   Neuro- oriented, appropriate, no focal weakness   Lab Results:  Recent Labs  08/17/17 0501 08/17/17 1609  WBC 13.1*  --   HGB 11.1* 10.2*  HCT 33.2* 30.0*  PLT 162  --    BMET:  Recent Labs  08/17/17 0501 08/17/17 1609 08/17/17 1610  NA 133* 134* 132*  K 3.9 3.3* 3.2*  CL 101 100* 100*  CO2 22  --  18*  GLUCOSE 126* 146* 143*  BUN 22*  30* 32*  CREATININE 1.42* 1.90* 1.96*  CALCIUM 7.5*  --  8.0*    PT/INR: No results for input(s): LABPROT, INR in the last 72 hours. ABG    Component Value Date/Time   PHART 7.581 (H) 08/17/2017 0325   HCO3 20.1 08/17/2017 0325   TCO2 20 (L) 08/17/2017 1609   ACIDBASEDEF 1.8 08/17/2017 0325   O2SAT 58.7 08/17/2017 0850   CBG (last 3)   Recent Labs  08/16/17 1646 08/17/17 0010 08/17/17 1306  GLUCAP 85 114* 93    Assessment/Plan: S/P Procedure(s) (LRB): CORONARY ARTERY BYPASS GRAFTING (CABG) x 2 WITH ENDOSCOPIC HARVESTING OF RIGHT SAPHENOUS VEIN. (N/A) TRANSESOPHAGEAL ECHOCARDIOGRAM (TEE) (N/A) Observe native renal functional CRT has stopped Continue physical therapy mobilization Repeat swallow study, assess for po diet Continue IV heparin and amiodarone for intermittent atrial fibrillation  LOS: 17 days    Stanley Robles 08/17/2017

## 2017-08-17 NOTE — Progress Notes (Signed)
Physical Therapy Treatment Patient Details Name: Stanley Robles MRN: [id number] DOB: Apr 05, 1932 Today's Date: 08/17/2017    History of Present Illness 83 yo admitted with NSTEMI s/p cath which showed severe left main disease and total right coronary artery stenosis, s/p CABG 08/08/17. experienced acute renal failure Placed on CRRT 08/12/17 PMhx:  CVA, sensorineural hearing loss, HLD, PVD, pAF, CKD stage 3, HTN, NSTEMI, chronic diastolic heart failure    PT Comments    Pt hearing aid is no longer working and communication via writing was necessary, however pt agreable to transferring to the recliner. Pt currently maxA for bed moblity, and transfers and modA for taking 6 steps towards the recliner. Pt requires skilled PT to progress mobility and improve strength and endurance to safely navigate their discharge environment.      Follow Up Recommendations  SNF     Equipment Recommendations  Other (comment) (to be determined at next venue)       Precautions / Restrictions Precautions Precautions: Fall;Sternal Restrictions Weight Bearing Restrictions: Yes (sternal precautions)    Mobility  Bed Mobility Overal bed mobility: Needs Assistance Bed Mobility: Supine to Sit     Supine to sit: Max assist     General bed mobility comments: maxAx2 for trunk to upright and hips to EoB with pad scoot, pt able to move LE to EoB   Transfers Overall transfer level: Needs assistance Equipment used: 1 person hand held assist Transfers: Sit to/from Stand Sit to Stand: Mod assist         General transfer comment: modA for power up maintaining sternal precautions, good power up   Ambulation/Gait Ambulation/Gait assistance: Mod assist Ambulation Distance (Feet): 1 Feet Assistive device: 1 person hand held assist Gait Pattern/deviations: Step-to pattern;Shuffle Gait velocity: slowed Gait velocity interpretation: Below normal speed for age/gender General Gait Details: ModA for steadying with  HHA, pr able to take 6 small steps towards recliner      Balance Overall balance assessment: Needs assistance Sitting-balance support: Feet supported;No upper extremity supported Sitting balance-Leahy Scale: Poor Sitting balance - Comments: Pt required minA to maintain seated balance    Standing balance support: Bilateral upper extremity supported Standing balance-Leahy Scale: Poor Standing balance comment: requires modA to maintain standing                             Cognition Arousal/Alertness: Awake/alert Behavior During Therapy: WFL for tasks assessed/performed Overall Cognitive Status: Within Functional Limits for tasks assessed                                        Exercises General Exercises - Lower Extremity Ankle Circles/Pumps: AROM;Both;10 reps;Seated Long Arc Quad: AROM;Both;10 reps;Seated Hip ABduction/ADduction: AROM;Both;10 reps;Seated Hip Flexion/Marching: AROM;Both;10 reps;Seated    General Comments General comments (skin integrity, edema, etc.): BP 120/61, HR 109, RR 16, SaO2 on 3L O2 via nasal cannula 98%, max HR with activity 118bpm      Pertinent Vitals/Pain Pain Assessment: Faces Faces Pain Scale: Hurts little more Pain Location: sternum Pain Descriptors / Indicators: Constant;Aching Pain Intervention(s): Monitored during session;Limited activity within patient's tolerance           PT Goals (current goals can now be found in the care plan section) Acute Rehab PT Goals PT Goal Formulation: With patient Time For Goal Achievement: 08/18/17 Potential to Achieve Goals: Fair Progress towards PT  goals: Progressing toward goals    Frequency    Min 3X/week      PT Plan Current plan remains appropriate    Co-evaluation              AM-PAC PT "6 Clicks" Daily Activity  Outcome Measure  Difficulty turning over in bed (including adjusting bedclothes, sheets and blankets)?: Unable Difficulty moving from lying  on back to sitting on the side of the bed? : Unable Difficulty sitting down on and standing up from a chair with arms (e.g., wheelchair, bedside commode, etc,.)?: Unable Help needed moving to and from a bed to chair (including a wheelchair)?: A Lot Help needed walking in hospital room?: Total Help needed climbing 3-5 steps with a railing? : Total 6 Click Score: 7    End of Session Equipment Utilized During Treatment: Gait belt;Oxygen Activity Tolerance: Patient limited by fatigue Patient left: in chair;with call bell/phone within reach;with chair alarm set;with nursing/sitter in room Nurse Communication: Mobility status PT Visit Diagnosis: Difficulty in walking, not elsewhere classified (R26.2);Muscle weakness (generalized) (M62.81)     Time: 3329-5188 PT Time Calculation (min) (ACUTE ONLY): 20 min  Charges:  $Therapeutic Exercise: 8-22 mins                    G Codes:       Stanley Robles B. Stanley Robles PT, DPT Acute Rehabilitation  [phone] Pager [phone]     [place] 08/17/2017, 2:26 PM

## 2017-08-17 NOTE — Progress Notes (Signed)
ANTICOAGULATION CONSULT NOTE  Pharmacy Consult:  Heparin Indication: atrial fibrillation  Allergies  Allergen Reactions  . Lipitor [Atorvastatin] Other (See Comments)    Severe constipation    Patient Measurements: Height: 5\' 7"  (170.2 cm) Weight: 175 lb 0.7 oz (79.4 kg) IBW/kg (Calculated) : 66.1 Heparin Dosing Weight: 83 kg  Vital Signs: Temp: 97.8 F (36.6 C) (09/24 0800) Temp Source: Axillary (09/24 0800) BP: 104/35 (09/24 1232) Pulse Rate: 106 (09/24 1232)  Labs:  Recent Labs  08/16/17 0429  08/16/17 0825 08/16/17 1322 08/16/17 1536 08/17/17 0501 08/17/17 0519  HGB  --   --   --   --   --  11.1*  --   HCT  --   --   --   --   --  33.2*  --   PLT  --   --   --   --   --  162  --   HEPARINUNFRC 0.28*  --   --  0.31  --   --  0.33  CREATININE  --   < > 1.25*  --  1.27* 1.42*  --   < > = values in this interval not displayed.  Estimated Creatinine Clearance: 40.5 mL/min (A) (by C-G formula based on SCr of 1.42 mg/dL (H)).  Assessment: 81 YOM with history of PAF on Eliquis PTA, last dose 07/31/17 at 1800.  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 CBC stable and negative HIT Ab and SRA, therefore, heparin bridge started 08/13/17.   Heparin level at goal of 0.33. CBC stable, no bleeding noted.      Goal of Therapy:  Heparin level 0.3 - 0.4 per TCTS APTT 66-88 sec Monitor platelets by anticoagulation protocol: Yes   Plan:  Continue heparin gtt to 1250 units/hr  Daily heparin level and CBC Monitor for s/s bleeding   Leroy Libman, PharmD Pharmacy Resident Pager: [phone]

## 2017-08-18 ENCOUNTER — Inpatient Hospital Stay (HOSPITAL_COMMUNITY): Payer: Medicare Other

## 2017-08-18 DIAGNOSIS — L8990 Pressure ulcer of unspecified site, unspecified stage: Secondary | ICD-10-CM | POA: Insufficient documentation

## 2017-08-18 LAB — COMPREHENSIVE METABOLIC PANEL
ALT: 33 U/L (ref 17–63)
AST: 46 U/L — ABNORMAL HIGH (ref 15–41)
Albumin: 2.3 g/dL — ABNORMAL LOW (ref 3.5–5.0)
Alkaline Phosphatase: 82 U/L (ref 38–126)
Anion gap: 13 (ref 5–15)
BUN: 46 mg/dL — ABNORMAL HIGH (ref 6–20)
CO2: 21 mmol/L — ABNORMAL LOW (ref 22–32)
Calcium: 8 mg/dL — ABNORMAL LOW (ref 8.9–10.3)
Chloride: 97 mmol/L — ABNORMAL LOW (ref 101–111)
Creatinine, Ser: 2.73 mg/dL — ABNORMAL HIGH (ref 0.61–1.24)
GFR calc Af Amer: 23 mL/min — ABNORMAL LOW (ref >60)
GFR calc non Af Amer: 20 mL/min — ABNORMAL LOW (ref >60)
Glucose, Bld: 117 mg/dL — ABNORMAL HIGH (ref 65–99)
Potassium: 3.3 mmol/L — ABNORMAL LOW (ref 3.5–5.1)
Sodium: 131 mmol/L — ABNORMAL LOW (ref 135–145)
Total Bilirubin: 1 mg/dL (ref 0.3–1.2)
Total Protein: 5.4 g/dL — ABNORMAL LOW (ref 6.5–8.1)

## 2017-08-18 LAB — COOXEMETRY PANEL
Carboxyhemoglobin: 1.3 % (ref 0.5–1.5)
Methemoglobin: 0.8 % (ref 0.0–1.5)
O2 Saturation: 56.4 %
Total hemoglobin: 9.6 g/dL — ABNORMAL LOW (ref 12.0–16.0)

## 2017-08-18 LAB — CBC
HCT: 28.2 % — ABNORMAL LOW (ref 39.0–52.0)
Hemoglobin: 9.5 g/dL — ABNORMAL LOW (ref 13.0–17.0)
MCH: 33.5 pg (ref 26.0–34.0)
MCHC: 33.7 g/dL (ref 30.0–36.0)
MCV: 99.3 fL (ref 78.0–100.0)
Platelets: 133 10*3/uL — ABNORMAL LOW (ref 150–400)
RBC: 2.84 MIL/uL — ABNORMAL LOW (ref 4.22–5.81)
RDW: 17 % — ABNORMAL HIGH (ref 11.5–15.5)
WBC: 11.7 10*3/uL — ABNORMAL HIGH (ref 4.0–10.5)

## 2017-08-18 LAB — POCT I-STAT, CHEM 8
BUN: 57 mg/dL — ABNORMAL HIGH (ref 6–20)
CALCIUM ION: 1.02 mmol/L — AB (ref 1.15–1.40)
CHLORIDE: 94 mmol/L — AB (ref 101–111)
Creatinine, Ser: 3.4 mg/dL — ABNORMAL HIGH (ref 0.61–1.24)
Glucose, Bld: 438 mg/dL — ABNORMAL HIGH (ref 65–99)
HCT: 26 % — ABNORMAL LOW (ref 39.0–52.0)
HEMOGLOBIN: 8.8 g/dL — AB (ref 13.0–17.0)
Potassium: 3.4 mmol/L — ABNORMAL LOW (ref 3.5–5.1)
SODIUM: 126 mmol/L — AB (ref 135–145)
TCO2: 20 mmol/L — AB (ref 22–32)

## 2017-08-18 LAB — GLUCOSE, CAPILLARY
GLUCOSE-CAPILLARY: 122 mg/dL — AB (ref 65–99)
GLUCOSE-CAPILLARY: 122 mg/dL — AB (ref 65–99)
GLUCOSE-CAPILLARY: 117 mg/dL — AB (ref 65–99)
Glucose-Capillary: 111 mg/dL — ABNORMAL HIGH (ref 65–99)
Glucose-Capillary: 102 mg/dL — ABNORMAL HIGH (ref 65–99)

## 2017-08-18 LAB — HEPARIN LEVEL (UNFRACTIONATED)
Heparin Unfractionated: 0.85 IU/mL — ABNORMAL HIGH (ref 0.30–0.70)
Heparin Unfractionated: 0.38 IU/mL (ref 0.30–0.70)

## 2017-08-18 LAB — MAGNESIUM: Magnesium: 1.9 mg/dL (ref 1.7–2.4)

## 2017-08-18 LAB — PHOSPHORUS: Phosphorus: 5.4 mg/dL — ABNORMAL HIGH (ref 2.5–4.6)

## 2017-08-18 MED ORDER — TRACE MINERALS CR-CU-MN-SE-ZN 10-1000-500-60 MCG/ML IV SOLN
INTRAVENOUS | Status: DC
  Administered 2017-08-18: 17:00:00 via INTRAVENOUS
  Filled 2017-08-18: qty 960

## 2017-08-18 MED ORDER — METOPROLOL TARTRATE 5 MG/5ML IV SOLN
5.0000 mg | Freq: Three times a day (TID) | INTRAVENOUS | Status: DC
  Administered 2017-08-18 – 2017-08-21 (×6): 5 mg via INTRAVENOUS
  Filled 2017-08-18 (×6): qty 5

## 2017-08-18 MED ORDER — POTASSIUM CHLORIDE 10 MEQ/50ML IV SOLN
10.0000 meq | INTRAVENOUS | Status: AC
  Administered 2017-08-18 (×3): 10 meq via INTRAVENOUS
  Filled 2017-08-18 (×3): qty 50

## 2017-08-18 MED ORDER — HYDROCODONE-ACETAMINOPHEN 7.5-325 MG/15ML PO SOLN
10.0000 mL | Freq: Four times a day (QID) | ORAL | Status: DC | PRN
  Administered 2017-08-18 – 2017-08-28 (×24): 10 mL via ORAL
  Filled 2017-08-18 (×25): qty 15

## 2017-08-18 MED ORDER — ENSURE ENLIVE PO LIQD
237.0000 mL | Freq: Two times a day (BID) | ORAL | Status: DC
  Administered 2017-08-19 – 2017-08-28 (×9): 237 mL via ORAL

## 2017-08-18 MED ORDER — DARBEPOETIN ALFA 60 MCG/0.3ML IJ SOSY
60.0000 ug | PREFILLED_SYRINGE | INTRAMUSCULAR | Status: DC
  Administered 2017-08-18: 60 ug via SUBCUTANEOUS
  Filled 2017-08-18 (×4): qty 0.3

## 2017-08-18 NOTE — Progress Notes (Signed)
ANTICOAGULATION CONSULT NOTE  Pharmacy Consult:  Heparin Indication: atrial fibrillation  Allergies  Allergen Reactions  . Lipitor [Atorvastatin] Other (See Comments)    Severe constipation   Patient Measurements: Height: 5\' 7"  (170.2 cm) Weight: 175 lb 0.7 oz (79.4 kg) IBW/kg (Calculated) : 66.1 Heparin Dosing Weight: 83 kg  Vital Signs: Temp: 97.4 F (36.3 C) (09/25 0358) Temp Source: Oral (09/25 0358) BP: 97/51 (09/25 0500) Pulse Rate: 72 (09/25 0500)  Labs:  Recent Labs  08/16/17 1322  08/17/17 0501 08/17/17 0519 08/17/17 1609 08/17/17 1610 08/18/17 0355  HGB  --   < > 11.1*  --  10.2*  --  9.5*  HCT  --   --  33.2*  --  30.0*  --  28.2*  PLT  --   --  162  --   --   --  133*  HEPARINUNFRC 0.31  --   --  0.33  --   --  0.85*  CREATININE  --   < > 1.42*  --  1.90* 1.96*  --   < > = values in this interval not displayed.  Estimated Creatinine Clearance: 29.3 mL/min (A) (by C-G formula based on SCr of 1.96 mg/dL (H)).  Assessment: 80 YOM with history of PAF on Eliquis PTA, last dose 07/31/17 at 1800.  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 CBC stable and negative HIT Ab and SRA, therefore, heparin bridge started 08/13/17.   Heparin level suddenly supratherapeutic at 0.85. Per RN, heparin drawn from PICC while gtt infusing in peripheral arm on opposite side. Also noted worsening Scr from 1.42 > 1.96 >2.73.   Hgb and plts downtrending slightly and no bleeding reported.      Goal of Therapy:  Heparin level 0.3 - 0.4 per TCTS APTT 66-88 sec Monitor platelets by anticoagulation protocol: Yes   Plan:  Decrease heparin gtt to 1000 units/hr Heparin level in 8 hrs  Daily heparin level and CBC Monitor for s/s bleeding   Argie Ramming, PharmD Clinical Pharmacist 08/18/17 6:00 AM

## 2017-08-18 NOTE — Progress Notes (Signed)
ANTICOAGULATION CONSULT NOTE  Pharmacy Consult:  Heparin Indication: atrial fibrillation  Allergies  Allergen Reactions  . Lipitor [Atorvastatin] Other (See Comments)    Severe constipation   Patient Measurements: Height: 5\' 7"  (170.2 cm) Weight: 162 lb 11.2 oz (73.8 kg) IBW/kg (Calculated) : 66.1 Heparin Dosing Weight: 83 kg  Vital Signs: Temp: 96.8 F (36 C) (09/25 1215) Temp Source: Axillary (09/25 1215) BP: 89/36 (09/25 1617) Pulse Rate: 95 (09/25 1617)  Labs:  Recent Labs  08/17/17 0501 08/17/17 0519 08/17/17 1609 08/17/17 1610 08/18/17 0355 08/18/17 1553 08/18/17 1642  HGB 11.1*  --  10.2*  --  9.5*  --  8.8*  HCT 33.2*  --  30.0*  --  28.2*  --  26.0*  PLT 162  --   --   --  133*  --   --   HEPARINUNFRC  --  0.33  --   --  0.85* 0.38  --   CREATININE 1.42*  --  1.90* 1.96* 2.73*  --  3.40*    Estimated Creatinine Clearance: 15.7 mL/min (A) (by C-G formula based on SCr of 3.4 mg/dL (H)).  Assessment: 64 YOM with history of PAF on Eliquis PTA, last dose 07/31/17 at 1800.  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 CBC stable and negative HIT Ab and SRA, therefore, heparin bridge started 08/13/17.   Heparin level this afternoon now back within goal range.  Hgb and plts downtrending slightly and no bleeding reported.      Goal of Therapy:  Heparin level 0.3 - 0.4 per TCTS Monitor platelets by anticoagulation protocol: Yes   Plan:  Continue IV heparin at 1000 units/hr Confirm heparin level in 8 hrs. Daily heparin level and CBC Monitor for s/s bleeding   Uvaldo Rising, BCPS  Clinical Pharmacist Pager [phone]  08/18/2017 4:51 PM

## 2017-08-18 NOTE — Plan of Care (Signed)
Problem: Nutritional: Goal: Risk for body nutrition deficit will decrease Outcome: Progressing Pt passed swallow evaluation this am, now on puree, honey thick liquid diet. Aspiration precautions followed with feedings, intake remains poor. Will encourage intake increase as tolerated.   Problem: Urinary Elimination: Goal: Ability to achieve and maintain adequate renal perfusion and functioning will improve Outcome: Progressing CRRT stopped 09/24 and monitoring renal function and urine output closely, foley catheter to remain in place at this time per Nephrologist request.

## 2017-08-18 NOTE — Progress Notes (Signed)
[facility] NOTE   Pharmacy Consult for TPN Indication: Ileus  Patient Measurements: Height: 5\' 7"  (170.2 cm) Weight: 162 lb 11.2 oz (73.8 kg) IBW/kg (Calculated) : 66.1 TPN AdjBW (KG): 76.6 Body mass index is 25.48 kg/m.  Assessment: 83 yo M with CABG x 2 on 9/14 now with acute renal failure and post-op ileus per Dr Prescott Gum.  PMH: CAD, HLD, PVD, AFib, CKD 3, HTN, diastolic HF  GI: Abdominal Xray on 9/21 does not suggest bowel dilatation. Having BMs. Albumin low at 2.3. Prealbumin improved 14.3. Ileus seems to be resolved, CVTS will not start TFs due to high risk of aspiration. CVTS requested decreased TPN rate when CRRT off 9/24-9/25. Now dysphagia diet started 9/25, weaning TPN. PPI IV Endo: No hx of DM. CBGs controlled. SSI Insulin requirements in the past 24 hours: 2 units Lytes: Na down 131, K 3.3 (s/p k runs x 1). Phos up to 5.4 off CRRT (s/p 17mmol Naphos per Renal). CoCa 9.2 (Ca-Phos product ~50). Mg 1.9 *No lytes in TPN Renal: ARF. CRRT 9/20 >> stopped 9/24. SCr up to 2.73 (baseline 1.5-2), BUN up 46 off CRRT. UOP 390cc/24h. No absolute need for HD per Renal. Lasix IV NS and LR both at kvo Pulm: [place] Cards: S/p cabg in afib on amio IV, milrinone. BP soft, HR 80s in afib on lopressor IV. Heparin IV for afib Hepatobil: AST mildly elevated, ALT WNL. Tbili/TG wnl Neuro: fent patch, prn ID: Zosyn for possible PNA. Afeb, WBC down 11.7  TPN Access: PICC TPN start date: 9/20  Nutritional Goals (per RD recommendation on 9/20): KCal: 1800 - 2000 / day Protein: 120 - 150 g / day  Goal Clinimix 5/15 @100ml /hr with IVFE on MWF  Current Nutrition: NPO, TPN  Plan:  Decrease Clinimix 5/15 (no lytes) to 40 ml/hr tonight Hold 20% lipid emulsion for first 7 days for ICU patients per ASPEN guidelines (Start date 9/27) Continue NS at 16ml/hr per MD Continue LR at 22ml/hr per MD This provides 48 g of protein and 682 kCals per day - meeting 40%  of protein and ~38% kCal needs Add MVI and trace elements in TPN Continue resistant SSI Q6h and adjust as needed Monitor TPN labs, renal function panel F/u toleration of diet - likely will d/c TPN tomorrow per discussion with CVTS  Give K runs x 3 - already ordered per Renal   Elicia Lamp, PharmD, BCPS Clinical Pharmacist Rx Phone # for today: [phone] After 3:30PM, please call Main Rx: [phone] 08/18/2017 7:43 AM

## 2017-08-18 NOTE — Progress Notes (Signed)
CT surgery p.m. Rounds  Patient had good day up to chair Starting dysphagia diet Patient was in sinus rhythm for some of the mid day. Urine output remains 20 cc/h despite Lasix challenge

## 2017-08-18 NOTE — Progress Notes (Signed)
CSW will continue to follow for SNF placement when pt is medically stable  Jorge Ny, [place] Social Worker [phone]

## 2017-08-18 NOTE — Progress Notes (Signed)
  Speech Language Pathology Treatment: Dysphagia  Patient Details Name: Stanley Robles MRN: [id number] DOB: Nov 11, 1932 Today's Date: 08/18/2017 Time: 3212-2482 SLP Time Calculation (min) (ACUTE ONLY): 24 min  Assessment / Plan / Recommendation Clinical Impression  Diagnostic assessment for potential po recommendation after NPO with ileus. Vocal quality intermittently wet at baseline. Oral care to facilitate infection prevention/ control provided and removed moderate dried mucous from hard palate. Suspect pt may trigger swallow at the vallecula. No cough or throat clearing; slight dyspnea. Pt is at high aspiration risk given history of dysphagia prior to this hospitalization and deconditioning/decreased endurance. Pt was consuming Dys 2, nectar thick liquids prior to ileus. Will recommend he start cautiously on Dys 1 (puree) and honey thick, crush pills. Educated Therapist, sports to stop po's if any signs of aspiration or increased respiratory demand. SLP will likely recommend objective assessment if pt exhibits any sign of not tolerating.    HPI HPI: Pt is an 83 yo male admitted with NSTEMI s/p cath which showed severe left main disease and total right coronary artery stenosis, potential CABG planned for 9/14. 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 Was evaluated by ST this admission prior to CABG; MBS 08/05/17 revealed mild oropharyngeal and cervical esophageal dysphagia, suspected to be more chronic in nature given his h/o dysphagia from prior CVA and h/o GERD. Dys 3, thin liquids, no straws recommended with ST to follow briefly through surgery given his history. Pt was intubated for CABG on 9/14, transported back to floor on vent and subsequent extubated at 10pm same day. Pt was consuming Dys 2, nectar > developed ileus > NPO and diagnostic reassessment this morning.        SLP Plan  Continue with current plan  of care       Recommendations  Diet recommendations: Dysphagia 1 (puree);Honey-thick liquid Liquids provided via: Cup;No straw Medication Administration: Crushed with puree Supervision: Patient able to self feed;Full supervision/cueing for compensatory strategies Compensations: Slow rate;Small sips/bites Postural Changes and/or Swallow Maneuvers: Seated upright 90 degrees                Oral Care Recommendations: Oral care QID Follow up Recommendations:  (TBD) Plan: Continue with current plan of care       GO                Stanley Robles 08/18/2017, 9:41 AM  Stanley Robles.Ed [employer] [phone]

## 2017-08-18 NOTE — Progress Notes (Addendum)
11 Days Post-Op Procedure(s) (LRB): CORONARY ARTERY BYPASS GRAFTING (CABG) x 2 WITH ENDOSCOPIC HARVESTING OF RIGHT SAPHENOUS VEIN. (N/A) TRANSESOPHAGEAL ECHOCARDIOGRAM (TEE) (N/A) Subjective: Urgent CABG 2 for critical left main stenosis Acute on chronic renal failure now off CRT with urine output only 20 cc/h on IV Lasix Previous brainstem CVA with swallowing dysfunction-recent evaluation by SLT demonstrates ability to start dysphagia diet Postoperative probable aspiration pneumonia on IV vancomycin and Zosyn-finish her 10 day course Postoperative ileus now improved with BM 2 Gen. deconditioning requiring maximum support to transfer to bed Recurrent postop atrial fibrillation on IV heparin and amiodarone  Objective: Vital signs in last 24 hours: Temp:  [97.4 F (36.3 C)-98.6 F (37 C)] 97.5 F (36.4 C) (09/25 0831) Pulse Rate:  [28-117] 83 (09/25 0630) Cardiac Rhythm: Atrial fibrillation (09/25 0600) Resp:  [12-40] 20 (09/25 0630) BP: (86-144)/(33-79) 126/45 (09/25 0630) SpO2:  [76 %-100 %] 100 % (09/25 0630) Arterial Line BP: (89-156)/(21-40) 122/29 (09/24 1300) Weight:  [162 lb 11.2 oz (73.8 kg)] 162 lb 11.2 oz (73.8 kg) (09/25 0500)  Hemodynamic parameters for last 24 hours: CVP:  [2 mmHg-4 mmHg] 2 mmHg  Intake/Output from previous day: 09/24 0701 - 09/25 0700 In: 2623.3 [I.V.:2113.3; IV Piggyback:510] Out: 979 [Urine:390] Intake/Output this shift: No intake/output data recorded.       Exam    General- alert and comfortable   Lungs- clear without rales, wheezes   Cor- regular rate and rhythm, no murmur , gallop   Abdomen- soft, non-tender   Extremities - warm, non-tender, minimal edema   Neuro- oriented, appropriate, no focal weakness   Lab Results:  Recent Labs  08/17/17 0501 08/17/17 1609 08/18/17 0355  WBC 13.1*  --  11.7*  HGB 11.1* 10.2* 9.5*  HCT 33.2* 30.0* 28.2*  PLT 162  --  133*   BMET:  Recent Labs  08/17/17 1610 08/18/17 0355  NA 132*  131*  K 3.2* 3.3*  CL 100* 97*  CO2 18* 21*  GLUCOSE 143* 117*  BUN 32* 46*  CREATININE 1.96* 2.73*  CALCIUM 8.0* 8.0*    PT/INR: No results for input(s): LABPROT, INR in the last 72 hours. ABG    Component Value Date/Time   PHART 7.581 (H) 08/17/2017 0325   HCO3 20.1 08/17/2017 0325   TCO2 20 (L) 08/17/2017 1609   ACIDBASEDEF 1.8 08/17/2017 0325   O2SAT 56.4 08/18/2017 0420   CBG (last 3)   Recent Labs  08/17/17 1758 08/18/17 0653 08/18/17 0829  GLUCAP 123* 111* 122*    Assessment/Plan: S/P Procedure(s) (LRB): CORONARY ARTERY BYPASS GRAFTING (CABG) x 2 WITH ENDOSCOPIC HARVESTING OF RIGHT SAPHENOUS VEIN. (N/A) TRANSESOPHAGEAL ECHOCARDIOGRAM (TEE) (N/A) Follow renal function-appreciate nephrology input Advance diet slowly per SLT evaluation Continue with daily mobilization to chair Taper off TPN as patient tolerates oral diet-tube feedings would be at high risk for aspiration which caused the patient's postoperative pneumonia  LOS: 18 days    Tharon Aquas Trigt III 08/18/2017

## 2017-08-18 NOTE — Progress Notes (Signed)
Subjective:  CRRT stopped yesterday- made 390 of urine last 24 hours on lasix 80 BID  Objective Vital signs in last 24 hours: Vitals:   08/18/17 0500 08/18/17 0530 08/18/17 0600 08/18/17 0630  BP: (!) 97/51 (!) 120/33 (!) 120/38 (!) 126/45  Pulse: 72 84 79 83  Resp: 16 13 16 20   Temp:      TempSrc:      SpO2: 100% 100% 100% 100%  Weight: 73.8 kg (162 lb 11.2 oz)     Height:       Weight change: -5.6 kg (-12 lb 5.5 oz)  Intake/Output Summary (Last 24 hours) at 08/18/17 0740 Last data filed at 08/18/17 2505  Gross per 24 hour  Intake          2623.32 ml  Output              861 ml  Net          1762.32 ml    Assessment/ Plan: Pt is a 83 y.o. yo male with CKD who was admitted on 07/31/2017 now  POD 11 from CABG- c/b A on CRF  Assessment/Plan: 1. Renal- dialysis dependant A on CRF - baseline crt 1.5-2.  AKI likely due to ATN after CABG- remains oligoanuric- on CRRT from 9/20-9/24.   I am not sure of renal function prognosis- I dont think he can comprehend what this may mean for him, will need to speak with daughters.  BUN and creatinine worse today but no absolute need for HD  2. HTN/vol- had been tolerating UF at 100 per hour, around 2200 per 24 hours while on CRRT -  not too overloaded with CVP 2-3- have added diuretic- cont to watch UOP 3. Anemia- hgb over 11 but now in the 9's - add ESA 4. Secondary hyperparathyroidism- phos if anything low- repleted 9/24- is up now- no action 5. Hypo K- will give some more again today- adjust up in TPN ?  6. Hyponatremia- argues for volume excess- cont diuretics    Stanley Robles A    Labs: Basic Metabolic Panel:  Recent Labs Lab 08/17/17 0501 08/17/17 1609 08/17/17 1610 08/18/17 0355  NA 133* 134* 132* 131*  K 3.9 3.3* 3.2* 3.3*  CL 101 100* 100* 97*  CO2 22  --  18* 21*  GLUCOSE 126* 146* 143* 117*  BUN 22* 30* 32* 46*  CREATININE 1.42* 1.90* 1.96* 2.73*  CALCIUM 7.5*  --  8.0* 8.0*  PHOS 2.0*  --  6.0* 5.4*   Liver  Function Tests:  Recent Labs Lab 08/16/17 0410  08/17/17 0501 08/17/17 1610 08/18/17 0355  AST 26  --  40  --  46*  ALT 23  --  28  --  33  ALKPHOS 64  --  76  --  82  BILITOT 0.9  --  1.1  --  1.0  PROT 5.7*  --  6.2*  --  5.4*  ALBUMIN 2.4*  < > 2.6* 2.4* 2.3*  < > = values in this interval not displayed. No results for input(s): LIPASE, AMYLASE in the last 168 hours. No results for input(s): AMMONIA in the last 168 hours. CBC:  Recent Labs Lab 08/12/17 0346  08/13/17 0223  08/14/17 0259 08/17/17 0501 08/17/17 1609 08/18/17 0355  WBC 9.0  --  9.8  --  7.9 13.1*  --  11.7*  NEUTROABS  --   --   --   --  6.2 10.0*  --   --  HGB 8.5*  < > 9.1*  < > 9.2* 11.1* 10.2* 9.5*  HCT 24.6*  < > 26.0*  < > 26.9* 33.2* 30.0* 28.2*  MCV 98.0  --  96.7  --  97.8 99.4  --  99.3  PLT 129*  --  129*  --  108* 162  --  133*  < > = values in this interval not displayed. Cardiac Enzymes: No results for input(s): CKTOTAL, CKMB, CKMBINDEX, TROPONINI in the last 168 hours. CBG:  Recent Labs Lab 08/16/17 1646 08/17/17 0010 08/17/17 1306 08/17/17 1758 08/18/17 0653  GLUCAP 85 114* 93 123* 111*    Iron Studies: No results for input(s): IRON, TIBC, TRANSFERRIN, FERRITIN in the last 72 hours. Studies/Results: Dg Chest Port 1 View  Result Date: 08/18/2017 CLINICAL DATA:  Shortness of breath.  CABG. EXAM: PORTABLE CHEST 1 VIEW COMPARISON:  08/16/2017. FINDINGS: Left subclavian line left PICC line in stable position. Prior CABG. Stable cardiomegaly. Improvement of basilar interstitial prominence consistent improving CHF. Mild bibasilar atelectasis . No pleural effusion or pneumothorax. Surgical staples and clips right chest. IMPRESSION: 1.  Left subclavian line and left PICC line in stable position. 2. Prior CABG. Stable cardiomegaly. Improving bilateral interstitial prominence consistent improving CHF. 3. Mild basilar atelectasis. Electronically Signed   By: Marcello Moores  Register   On: 08/18/2017  07:15   Dg Chest Port 1 View  Result Date: 08/16/2017 CLINICAL DATA:  Congestive heart failure EXAM: PORTABLE CHEST 1 VIEW COMPARISON:  08/15/2017 FINDINGS: Mild improvement in congestive heart failure with edema. Bilateral effusions left greater than right unchanged. Bibasilar atelectasis left greater than right unchanged. Left arm PICC tip in the lower SVC unchanged. Left subclavian dual lumen central venous catheter tip in the left innominate vein unchanged. IMPRESSION: Mild improvement in congestive heart failure and edema. Bibasilar atelectasis and effusion left greater than right Electronically Signed   By: Franchot Gallo M.D.   On: 08/16/2017 14:48   Medications: Infusions: . sodium chloride Stopped (08/08/17 0900)  . sodium chloride    . sodium chloride 20 mL (08/17/17 2044)  . sodium chloride    . sodium chloride    . amiodarone 30 mg/hr (08/17/17 2040)  . heparin 1,000 Units/hr (08/18/17 9449)  . lactated ringers 10 mL/hr at 08/10/17 0800  . milrinone 0.125 mcg/kg/min (08/18/17 0600)  . piperacillin-tazobactam (ZOSYN)  IV Stopped (08/18/17 6759)  . TPN (CLINIMIX) Adult without lytes 50 mL/hr at 08/18/17 0600    Scheduled Medications: . allopurinol  200 mg Oral Daily  . aspirin EC  325 mg Oral Daily   Or  . aspirin  324 mg Per Tube Daily  . bisacodyl  10 mg Oral Daily   Or  . bisacodyl  10 mg Rectal Daily  . chlorhexidine  15 mL Mouth Rinse BID  . Chlorhexidine Gluconate Cloth  6 each Topical Q0600  . docusate sodium  200 mg Oral Daily  . fentaNYL  12.5 mcg Transdermal Q72H  . finasteride  5 mg Oral Daily  . furosemide  80 mg Intravenous Q12H  . insulin aspart  0-24 Units Subcutaneous Q6H  . mouth rinse  15 mL Mouth Rinse q12n4p  . metoprolol tartrate  5 mg Intravenous Q6H  . pantoprazole (PROTONIX) IV  40 mg Intravenous Q24H  . rosuvastatin  10 mg Oral q1800  . sodium chloride flush  10-40 mL Intracatheter Q12H  . sodium chloride flush  10-40 mL Intracatheter Q12H   . sodium chloride flush  3 mL Intravenous Q12H  have reviewed scheduled and prn medications.  Physical Exam: General: communicate via writing board- he is c/o back pain - asked "are they working a little bit" meaning his kidneys Heart: tachy Lungs: prolonged exp phase Abdomen: soft, non tender Extremities: minimal edema Dialysis Access: vascath placed 9/19    08/18/2017,7:40 AM  LOS: 18 days

## 2017-08-18 NOTE — Progress Notes (Signed)
Nutrition Follow-up  DOCUMENTATION CODES:   Obesity unspecified  INTERVENTION:   Ensure Enlive po BID, each supplement provides 350 kcal and 20 grams of protein  Encourage PO intake on dysphagia diet   NUTRITION DIAGNOSIS:   Increased nutrient needs related to acute illness (s/p CABG 08/07/17) as evidenced by estimated needs. Ongoing.   GOAL:   Patient will meet greater than or equal to 90% of their needs Met.   MONITOR:   Diet advancement, Supplement acceptance, I & O's, Labs  ASSESSMENT:   Pt with PMH of HLD, PVD, CAD, gout, BPH, GERD, TIA, CKD stage III, memory loss presented with chest pain found NSTEMI s/p CABG 08/07/17.   Pt very HOH. Pt reports no appetite. Lunch at bedside, pt had only consumed a few sips of beverages. RN reports pt has not wanted to eat or drink much. Encouraged pt to eat/drink to improve strength.   CRRT stopped 9/24 UOP 390 ml x 24 hours TPN to d/c 9/26 Clinimix without electrolytes @ 40 ml/hr  Provides: 681 kcal and 48 grams protein  Medications reviewed and include: dulcolax, colace, lasix Labs reviewed: Na 131 (L), K+ 3.3 (L), PO4: 5.4 (H), BUN: 46 (H), Cr 2.73 (H) CBG's: 111-122-122  Diet Order:  TPN (CLINIMIX) Adult without lytes TPN (CLINIMIX) Adult without lytes DIET - DYS 1 Room service appropriate? Yes; Fluid consistency: Honey Thick  Skin:   (stage II ear, chest and leg incisions)  Last BM:  9/24  Height:   Ht Readings from Last 1 Encounters:  08/07/17 5' 7" (1.702 m)    Weight:   Wt Readings from Last 1 Encounters:  08/18/17 162 lb 11.2 oz (73.8 kg)    Ideal Body Weight:  67 kg  BMI:  Body mass index is 25.48 kg/m.  Estimated Nutritional Needs:   Kcal:  1800-2000  Protein:  105-115 grams  Fluid:  >/= 1.8 L/d  EDUCATION NEEDS:   Education needs no appropriate at this time  [place], [place], [place] Pager [phone] After Hours Pager

## 2017-08-18 NOTE — Progress Notes (Signed)
Subjective:  Resting quietly in chair. Somewhat better overnight.  Still HOH.  Currently back in sinus.  Objective:  Vital Signs in the last 24 hours: BP (!) 117/45   Pulse 74   Temp (!) 96.8 F (36 C) (Axillary)   Resp (!) 23   Ht 5\' 7"  (1.702 m)   Wt 73.8 kg (162 lb 11.2 oz)   SpO2 100%   BMI 25.48 kg/m   Physical Exam: Elderly WM in NAD, HOH Lungs:  Reduced BS Cardiac:  regular rhythm, normal S1 and S2, no S3 Extremities:  No edema present  Intake/Output from previous day: 09/24 0701 - 09/25 0700 In: 2623.3 [I.V.:2113.3; IV Piggyback:510] Out: 957 [Urine:390] Weight Filed Weights   08/16/17 0325 08/17/17 0500 08/18/17 0500  Weight: 82.6 kg (182 lb 1.6 oz) 79.4 kg (175 lb 0.7 oz) 73.8 kg (162 lb 11.2 oz)    Lab Results: Basic Metabolic Panel:  Recent Labs  08/17/17 1610 08/18/17 0355  NA 132* 131*  K 3.2* 3.3*  CL 100* 97*  CO2 18* 21*  GLUCOSE 143* 117*  BUN 32* 46*  CREATININE 1.96* 2.73*    CBC:  Recent Labs  08/17/17 0501 08/17/17 1609 08/18/17 0355  WBC 13.1*  --  11.7*  NEUTROABS 10.0*  --   --   HGB 11.1* 10.2* 9.5*  HCT 33.2* 30.0* 28.2*  MCV 99.4  --  99.3  PLT 162  --  133*    BNP    Component Value Date/Time   BNP 560.8 (H) 08/01/2017 0057    Telemetry: Currently sinus with rate 72  Assessment/Plan:  1. Recent CABG 2. ARF on top of chronic but some UOP now CRT being stopped today 3. Prior stroke and dysphagia 4. Atrial fibrillation currently in sinus rhythm    He DOES NOT HAVE  chronic atrial fib but paroxysmal.   Back in sinus.  Continue amiodarone. Awaiting renal function return?     Kerry Hough  MD [facility]  08/18/2017, 1:28 PM

## 2017-08-19 LAB — HEPARIN LEVEL (UNFRACTIONATED)
Heparin Unfractionated: 0.26 IU/mL — ABNORMAL LOW (ref 0.30–0.70)
Heparin Unfractionated: 0.26 IU/mL — ABNORMAL LOW (ref 0.30–0.70)
Heparin Unfractionated: 0.43 IU/mL (ref 0.30–0.70)
Heparin Unfractionated: 0.62 IU/mL (ref 0.30–0.70)

## 2017-08-19 LAB — POCT I-STAT, CHEM 8
BUN: 67 mg/dL — ABNORMAL HIGH (ref 6–20)
CREATININE: 4.3 mg/dL — AB (ref 0.61–1.24)
Calcium, Ion: 1.16 mmol/L (ref 1.15–1.40)
Chloride: 98 mmol/L — ABNORMAL LOW (ref 101–111)
Glucose, Bld: 101 mg/dL — ABNORMAL HIGH (ref 65–99)
HEMATOCRIT: 32 % — AB (ref 39.0–52.0)
HEMOGLOBIN: 10.9 g/dL — AB (ref 13.0–17.0)
POTASSIUM: 4 mmol/L (ref 3.5–5.1)
Sodium: 130 mmol/L — ABNORMAL LOW (ref 135–145)
TCO2: 19 mmol/L — AB (ref 22–32)

## 2017-08-19 LAB — CBC
HCT: 29.4 % — ABNORMAL LOW (ref 39.0–52.0)
Hemoglobin: 9.9 g/dL — ABNORMAL LOW (ref 13.0–17.0)
MCH: 33.4 pg (ref 26.0–34.0)
MCHC: 33.7 g/dL (ref 30.0–36.0)
MCV: 99.3 fL (ref 78.0–100.0)
Platelets: 127 10*3/uL — ABNORMAL LOW (ref 150–400)
RBC: 2.96 MIL/uL — ABNORMAL LOW (ref 4.22–5.81)
RDW: 17 % — ABNORMAL HIGH (ref 11.5–15.5)
WBC: 12 10*3/uL — ABNORMAL HIGH (ref 4.0–10.5)

## 2017-08-19 LAB — GLUCOSE, CAPILLARY
GLUCOSE-CAPILLARY: 99 mg/dL (ref 65–99)
GLUCOSE-CAPILLARY: 93 mg/dL (ref 65–99)
Glucose-Capillary: 129 mg/dL — ABNORMAL HIGH (ref 65–99)
Glucose-Capillary: 137 mg/dL — ABNORMAL HIGH (ref 65–99)

## 2017-08-19 LAB — PHOSPHORUS: Phosphorus: 5.6 mg/dL — ABNORMAL HIGH (ref 2.5–4.6)

## 2017-08-19 LAB — COMPREHENSIVE METABOLIC PANEL
ALT: 36 U/L (ref 17–63)
AST: 38 U/L (ref 15–41)
Albumin: 2.3 g/dL — ABNORMAL LOW (ref 3.5–5.0)
Alkaline Phosphatase: 83 U/L (ref 38–126)
Anion gap: 14 (ref 5–15)
BUN: 65 mg/dL — ABNORMAL HIGH (ref 6–20)
CO2: 20 mmol/L — ABNORMAL LOW (ref 22–32)
Calcium: 8.3 mg/dL — ABNORMAL LOW (ref 8.9–10.3)
Chloride: 96 mmol/L — ABNORMAL LOW (ref 101–111)
Creatinine, Ser: 3.77 mg/dL — ABNORMAL HIGH (ref 0.61–1.24)
GFR calc Af Amer: 16 mL/min — ABNORMAL LOW (ref >60)
GFR calc non Af Amer: 14 mL/min — ABNORMAL LOW (ref >60)
Glucose, Bld: 103 mg/dL — ABNORMAL HIGH (ref 65–99)
Potassium: 3.5 mmol/L (ref 3.5–5.1)
Sodium: 130 mmol/L — ABNORMAL LOW (ref 135–145)
Total Bilirubin: 0.9 mg/dL (ref 0.3–1.2)
Total Protein: 5.7 g/dL — ABNORMAL LOW (ref 6.5–8.1)

## 2017-08-19 LAB — MAGNESIUM: Magnesium: 1.9 mg/dL (ref 1.7–2.4)

## 2017-08-19 LAB — COOXEMETRY PANEL
Carboxyhemoglobin: 0.9 % (ref 0.5–1.5)
Methemoglobin: 1.2 % (ref 0.0–1.5)
O2 Saturation: 51.7 %
Total hemoglobin: 9.9 g/dL — ABNORMAL LOW (ref 12.0–16.0)

## 2017-08-19 MED ORDER — POTASSIUM CHLORIDE 10 MEQ/50ML IV SOLN
10.0000 meq | INTRAVENOUS | Status: AC
  Administered 2017-08-19 (×3): 10 meq via INTRAVENOUS
  Filled 2017-08-19 (×3): qty 50

## 2017-08-19 MED ORDER — INSULIN ASPART 100 UNIT/ML ~~LOC~~ SOLN
0.0000 [IU] | Freq: Three times a day (TID) | SUBCUTANEOUS | Status: DC
  Administered 2017-08-19 – 2017-08-22 (×4): 1 [IU] via SUBCUTANEOUS

## 2017-08-19 NOTE — Progress Notes (Addendum)
ANTICOAGULATION CONSULT NOTE - Follow Up Consult  Pharmacy Consult for heparin Indication: atrial fibrillation  Labs:  Recent Labs  08/17/17 0501  08/18/17 0355  08/18/17 1642 08/18/17 2350 08/19/17 0332 08/19/17 1305  HGB 11.1*  < > 9.5*  --  8.8*  --  9.9*  --   HCT 33.2*  < > 28.2*  --  26.0*  --  29.4*  --   PLT 162  --  133*  --   --   --  127*  --   HEPARINUNFRC  --   < > 0.85*  < >  --  0.43 0.26* 0.26*  CREATININE 1.42*  < > 2.73*  --  3.40*  --  3.77*  --   < > = values in this interval not displayed.   Assessment/Plan:  83yo male with history of PAF subtherapeutic on heparin x 2. RN confirms no interruptions to therapy and infusion running correctly.   Hgb and plts downtrending and no bleeding reported.      Goal of Therapy:  Heparin level 0.3 - 0.4 per TCTS Monitor platelets by anticoagulation protocol: Yes   Plan:  Increase heparin gtt to 1250 units/hr  Confirm heparin level in 8 hrs. Daily heparin level and CBC Monitor for s/s bleeding   Leroy Libman, PharmD Pharmacy Resident Pager: [phone]

## 2017-08-19 NOTE — Progress Notes (Signed)
Subjective:  UOP 815 yest - up from 390 day before   Objective Vital signs in last 24 hours: Vitals:   08/19/17 0600 08/19/17 0610 08/19/17 0629 08/19/17 0800  BP: (!) 152/56 120/60 (!) 104/49   Pulse: 89     Resp: (!) 22     Temp:    97.8 F (36.6 C)  TempSrc:    Oral  SpO2: 100%     Weight: 77.2 kg (170 lb 3.1 oz)     Height:       Weight change: 3.4 kg (7 lb 7.9 oz)  Intake/Output Summary (Last 24 hours) at 08/19/17 0804 Last data filed at 08/19/17 0600  Gross per 24 hour  Intake          1843.22 ml  Output              795 ml  Net          1048.22 ml    Assessment/ Plan: Pt is a 83 y.o. yo male with CKD who was admitted on 07/31/2017 now  POD 11 from CABG- c/b A on CRF  Assessment/Plan: 1. Renal- dialysis dependant A on CRF - baseline crt 1.5-2.  AKI likely due to ATN after CABG- UOP not great but increasing- on CRRT from 9/20-9/24.   I am not sure of renal function prognosis- I dont think he can comprehend what this may mean for him, spoke to daughter Shauna Hugh- they of course are hoping that the dialysis is something he will not need long term but would be accepting of that if needed is the impression I got.  BUN and creatinine worse today but no absolute need for HD  2. HTN/vol- had been tolerating UF at 100 per hour, around 2200 per 24 hours while on CRRT -  not too overloaded with CVP 5-7- have added diuretic, making some urine- cont to watch UOP 3. Anemia- hgb over 11 but now in the 9's - added ESA 4. Secondary hyperparathyroidism- phos if anything low- repleted 9/24- is up now- no action 5. Hypo K- repleted yest, will do again today given a fib issues- now passed swallowing eval   6. Hyponatremia- argues for volume excess- cont diuretics - better    [place] A    Labs: Basic Metabolic Panel:  Recent Labs Lab 08/17/17 1610 08/18/17 0355 08/18/17 1642 08/19/17 0332  NA 132* 131* 126* 130*  K 3.2* 3.3* 3.4* 3.5  CL 100* 97* 94* 96*  CO2 18* 21*  --   20*  GLUCOSE 143* 117* 438* 103*  BUN 32* 46* 57* 65*  CREATININE 1.96* 2.73* 3.40* 3.77*  CALCIUM 8.0* 8.0*  --  8.3*  PHOS 6.0* 5.4*  --  5.6*   Liver Function Tests:  Recent Labs Lab 08/17/17 0501 08/17/17 1610 08/18/17 0355 08/19/17 0332  AST 40  --  46* 38  ALT 28  --  33 36  ALKPHOS 76  --  82 83  BILITOT 1.1  --  1.0 0.9  PROT 6.2*  --  5.4* 5.7*  ALBUMIN 2.6* 2.4* 2.3* 2.3*   No results for input(s): LIPASE, AMYLASE in the last 168 hours. No results for input(s): AMMONIA in the last 168 hours. CBC:  Recent Labs Lab 08/13/17 0223  08/14/17 0259 08/17/17 0501  08/18/17 0355 08/18/17 1642 08/19/17 0332  WBC 9.8  --  7.9 13.1*  --  11.7*  --  12.0*  NEUTROABS  --   --  6.2 10.0*  --   --   --   --  HGB 9.1*  < > 9.2* 11.1*  < > 9.5* 8.8* 9.9*  HCT 26.0*  < > 26.9* 33.2*  < > 28.2* 26.0* 29.4*  MCV 96.7  --  97.8 99.4  --  99.3  --  99.3  PLT 129*  --  108* 162  --  133*  --  127*  < > = values in this interval not displayed. Cardiac Enzymes: No results for input(s): CKTOTAL, CKMB, CKMBINDEX, TROPONINI in the last 168 hours. CBG:  Recent Labs Lab 08/18/17 0829 08/18/17 1214 08/18/17 1703 08/18/17 2354 08/19/17 0602  GLUCAP 122* 122* 117* 129* 99    Iron Studies: No results for input(s): IRON, TIBC, TRANSFERRIN, FERRITIN in the last 72 hours. Studies/Results: Dg Chest Port 1 View  Result Date: 08/18/2017 CLINICAL DATA:  Shortness of breath.  CABG. EXAM: PORTABLE CHEST 1 VIEW COMPARISON:  08/16/2017. FINDINGS: Left subclavian line left PICC line in stable position. Prior CABG. Stable cardiomegaly. Improvement of basilar interstitial prominence consistent improving CHF. Mild bibasilar atelectasis . No pleural effusion or pneumothorax. Surgical staples and clips right chest. IMPRESSION: 1.  Left subclavian line and left PICC line in stable position. 2. Prior CABG. Stable cardiomegaly. Improving bilateral interstitial prominence consistent improving CHF. 3.  Mild basilar atelectasis. Electronically Signed   By: Marcello Moores  Register   On: 08/18/2017 07:15   Medications: Infusions: . sodium chloride Stopped (08/08/17 0900)  . sodium chloride    . sodium chloride 20 mL (08/17/17 2044)  . sodium chloride    . sodium chloride    . amiodarone 30 mg/hr (08/18/17 2015)  . heparin 1,050 Units/hr (08/19/17 0522)  . lactated ringers 10 mL/hr at 08/10/17 0800  . milrinone Stopped (08/18/17 0830)  . piperacillin-tazobactam (ZOSYN)  IV Stopped (08/19/17 0559)  . TPN (CLINIMIX) Adult without lytes 40 mL/hr at 08/18/17 1726    Scheduled Medications: . allopurinol  200 mg Oral Daily  . aspirin EC  325 mg Oral Daily   Or  . aspirin  324 mg Per Tube Daily  . bisacodyl  10 mg Oral Daily   Or  . bisacodyl  10 mg Rectal Daily  . chlorhexidine  15 mL Mouth Rinse BID  . Chlorhexidine Gluconate Cloth  6 each Topical Q0600  . darbepoetin (ARANESP) injection - NON-DIALYSIS  60 mcg Subcutaneous Q Tue-1800  . docusate sodium  200 mg Oral Daily  . feeding supplement (ENSURE ENLIVE)  237 mL Oral BID BM  . fentaNYL  12.5 mcg Transdermal Q72H  . finasteride  5 mg Oral Daily  . furosemide  80 mg Intravenous Q12H  . insulin aspart  0-24 Units Subcutaneous Q6H  . mouth rinse  15 mL Mouth Rinse q12n4p  . metoprolol tartrate  5 mg Intravenous Q8H  . pantoprazole (PROTONIX) IV  40 mg Intravenous Q24H  . rosuvastatin  10 mg Oral q1800  . sodium chloride flush  10-40 mL Intracatheter Q12H  . sodium chloride flush  10-40 mL Intracatheter Q12H  . sodium chloride flush  3 mL Intravenous Q12H    have reviewed scheduled and prn medications.  Physical Exam: General: communicate via writing board- he is c/o back pain - but better today Heart: tachy Lungs: prolonged exp phase Abdomen: soft, non tender Extremities: minimal edema Dialysis Access: vascath placed 9/19    08/19/2017,8:04 AM  LOS: 19 days

## 2017-08-19 NOTE — Progress Notes (Signed)
      [address]       [place] [address]             [phone]    POD # 12 CABG   Comfortable  BP (!) 124/53   Pulse 74   Temp (!) 97.5 F (36.4 C) (Oral)   Resp 20   Ht 5\' 7"  (1.702 m)   Wt 170 lb 3.1 oz (77.2 kg)   SpO2 100%   BMI 26.66 kg/m    Intake/Output Summary (Last 24 hours) at 08/19/17 1725 Last data filed at 08/19/17 1600  Gross per 24 hour  Intake          1765.75 ml  Output             1490 ml  Net           275.75 ml   Good UOP so far today but creatinine up from 3.77 to 4.3  Remo Lipps C. Roxan Hockey, MD [facility] [phone]

## 2017-08-19 NOTE — Progress Notes (Signed)
Physical Therapy Treatment Patient Details Name: Stanley Robles MRN: [id number] DOB: February 06, 1932 Today's Date: 08/19/2017    History of Present Illness 83 yo admitted with NSTEMI s/p cath which showed severe left main disease and total right coronary artery stenosis, s/p CABG 08/08/17. experienced acute renal failure Placed on CRRT 08/12/17 removed from CRRT 08/17/17 PMhx:  CVA, sensorineural hearing loss, HLD, PVD, pAF, CKD stage 3, HTN, NSTEMI, chronic diastolic heart failure    PT Comments    Pt continues to make progress towards his goals. When PT had arrived pt had been sitting in chair for 8 hrs and was very fatigued. Pt able to sit<>stand with modA and walk 3 feet to bed with modA, and required maxA for management of LE back into bed. PT to work on progressing ambulation with next session when pt is less fatigued. Pt requires skilled PT to progress mobility and improve strength and endurance to safely navigate their discharge environment.    Follow Up Recommendations  SNF     Equipment Recommendations  Other (comment) (to be determined at next venue)    Recommendations for Other Services       Precautions / Restrictions Precautions Precautions: Fall;Sternal Restrictions Weight Bearing Restrictions: Yes (sternal precautions)    Mobility  Bed Mobility Overal bed mobility: Needs Assistance Bed Mobility: Sit to Supine       Sit to supine: Max assist;+2 for physical assistance   General bed mobility comments: maxA for management of LE against gravity back into bed, and trunk to bed surface  Transfers Overall transfer level: Needs assistance Equipment used: Rolling walker (2 wheeled) Transfers: Sit to/from Stand Sit to Stand: Mod assist;+2 physical assistance         General transfer comment: modA for powerup with pt holding pillow to maintain sternal precautions,   Ambulation/Gait Ambulation/Gait assistance: Mod assist Ambulation Distance (Feet): 3 Feet Assistive  device: Rolling walker (2 wheeled) Gait Pattern/deviations: Step-to pattern;Shuffle Gait velocity: slowed   General Gait Details: ModA for steadying with RW, good steady steps from recliner to chair        Balance Overall balance assessment: Needs assistance Sitting-balance support: Feet supported;No upper extremity supported Sitting balance-Leahy Scale: Poor Sitting balance - Comments: Pt required minA to maintain seated balance    Standing balance support: Bilateral upper extremity supported Standing balance-Leahy Scale: Poor Standing balance comment: requires modA to maintain standing                             Cognition Arousal/Alertness: Awake/alert Behavior During Therapy: WFL for tasks assessed/performed Overall Cognitive Status: Within Functional Limits for tasks assessed                                           General Comments General comments (skin integrity, edema, etc.): prior to activity BP 114/53, SaO2 on 2 L O2 via nasal cannula 100%O2, HR 77 bpm, after activity BP 143/52, SaO2 100%O2, HR 76bpm      Pertinent Vitals/Pain Pain Assessment: 0-10 Pain Score: 7  Faces Pain Scale: Hurts little more Pain Location: back Pain Descriptors / Indicators: Constant;Aching Pain Intervention(s): Monitored during session;Limited activity within patient's tolerance;Repositioned           PT Goals (current goals can now be found in the care plan section) Acute Rehab PT Goals PT Goal Formulation: With  patient Time For Goal Achievement: 08/18/17 Potential to Achieve Goals: Fair Progress towards PT goals: Progressing toward goals    Frequency    Min 3X/week      PT Plan Current plan remains appropriate       AM-PAC PT "6 Clicks" Daily Activity  Outcome Measure  Difficulty turning over in bed (including adjusting bedclothes, sheets and blankets)?: Unable Difficulty moving from lying on back to sitting on the side of the bed? :  Unable Difficulty sitting down on and standing up from a chair with arms (e.g., wheelchair, bedside commode, etc,.)?: Unable Help needed moving to and from a bed to chair (including a wheelchair)?: A Lot Help needed walking in hospital room?: Total Help needed climbing 3-5 steps with a railing? : Total 6 Click Score: 7    End of Session Equipment Utilized During Treatment: Gait belt;Oxygen Activity Tolerance: Patient limited by fatigue Patient left: in chair;with call bell/phone within reach;with chair alarm set;with nursing/sitter in room Nurse Communication: Mobility status PT Visit Diagnosis: Difficulty in walking, not elsewhere classified (R26.2);Muscle weakness (generalized) (M62.81)     Time: 2248-2500 PT Time Calculation (min) (ACUTE ONLY): 16 min  Charges:  $Therapeutic Activity: 8-22 mins                    G Codes:       Darcella Shiffman B. Migdalia Dk PT, DPT Acute Rehabilitation  [phone] Pager [phone]     [place] 08/19/2017, 3:42 PM

## 2017-08-19 NOTE — Progress Notes (Signed)
12 Days Post-Op Procedure(s) (LRB): CORONARY ARTERY BYPASS GRAFTING (CABG) x 2 WITH ENDOSCOPIC HARVESTING OF RIGHT SAPHENOUS VEIN. (N/A) TRANSESOPHAGEAL ECHOCARDIOGRAM (TEE) (N/A) Subjective: Patient had stable night with controlled atrial fibrillation and stable blood pressure Acute on chronic renal failure shows some improvement in urine output however BUN and creatinine continued to rise Tolerating dysphagia diet and pulmonary status stable Aspiration pneumonia responding to antibiotics Goal is for him to ambulate again with physical therapy  Objective: Vital signs in last 24 hours: Temp:  [96.9 F (36.1 C)-97.8 F (36.6 C)] 97.8 F (36.6 C) (09/26 1142) Pulse Rate:  [73-106] 92 (09/26 0800) Cardiac Rhythm: Atrial fibrillation (09/26 1000) Resp:  [14-27] 22 (09/26 1125) BP: (89-152)/(36-60) 98/53 (09/26 1125) SpO2:  [98 %-100 %] 100 % (09/26 1125) Weight:  [170 lb 3.1 oz (77.2 kg)] 170 lb 3.1 oz (77.2 kg) (09/26 0600)  Hemodynamic parameters for last 24 hours: CVP:  [5 mmHg] 5 mmHg  Intake/Output from previous day: 09/25 0701 - 09/26 0700 In: 2004 [P.O.:50; I.V.:1754; IV Piggyback:200] Out: 815 [Urine:815] Intake/Output this shift: Total I/O In: 306 [I.V.:256; IV Piggyback:50] Out: 500 [Urine:500]       Exam    General- alert and comfortable   Lungs- clear without rales, wheezes   Cor- regular rate and rhythm, no murmur , gallop   Abdomen- soft, non-tender   Extremities - warm, non-tender, minimal edema   Neuro- oriented, appropriate, no focal weakness   Lab Results:  Recent Labs  08/18/17 0355 08/18/17 1642 08/19/17 0332  WBC 11.7*  --  12.0*  HGB 9.5* 8.8* 9.9*  HCT 28.2* 26.0* 29.4*  PLT 133*  --  127*   BMET:  Recent Labs  08/18/17 0355 08/18/17 1642 08/19/17 0332  NA 131* 126* 130*  K 3.3* 3.4* 3.5  CL 97* 94* 96*  CO2 21*  --  20*  GLUCOSE 117* 438* 103*  BUN 46* 57* 65*  CREATININE 2.73* 3.40* 3.77*  CALCIUM 8.0*  --  8.3*    PT/INR:  No results for input(s): LABPROT, INR in the last 72 hours. ABG    Component Value Date/Time   PHART 7.581 (H) 08/17/2017 0325   HCO3 20.1 08/17/2017 0325   TCO2 20 (L) 08/18/2017 1642   ACIDBASEDEF 1.8 08/17/2017 0325   O2SAT 51.7 08/19/2017 0459   CBG (last 3)   Recent Labs  08/18/17 2354 08/19/17 0602 08/19/17 1140  GLUCAP 129* 99 137*    Assessment/Plan: S/P Procedure(s) (LRB): CORONARY ARTERY BYPASS GRAFTING (CABG) x 2 WITH ENDOSCOPIC HARVESTING OF RIGHT SAPHENOUS VEIN. (N/A) TRANSESOPHAGEAL ECHOCARDIOGRAM (TEE) (N/A) Mobilize Follow renal function and urine output, appreciate nephrology guidance and care IV amiodarone and IV heparin for atrial fibrillation We'll start Coumadin after patient has a tunneled hemodialysis catheter or AV fistula placed as needed.  LOS: 19 days    Tharon Aquas Trigt III 08/19/2017

## 2017-08-19 NOTE — Progress Notes (Addendum)
ANTICOAGULATION CONSULT NOTE - Follow Up Consult  Pharmacy Consult for heparin Indication: atrial fibrillation  Labs:  Recent Labs  08/17/17 0501  08/18/17 0355 08/18/17 1553 08/18/17 1642 08/18/17 2350 08/19/17 0332  HGB 11.1*  < > 9.5*  --  8.8*  --  9.9*  HCT 33.2*  < > 28.2*  --  26.0*  --  29.4*  PLT 162  --  133*  --   --   --  127*  HEPARINUNFRC  --   < > 0.85* 0.38  --  0.43 0.26*  CREATININE 1.42*  < > 2.73*  --  3.40*  --  3.77*  < > = values in this interval not displayed.   Assessment/Plan:  83yo male remains therapeutic on heparin. Will continue gtt at current rate and monitor level daily.   Wynona Neat, PharmD, BCPS  08/19/2017,12:33 AM   ADDENDUM: Heparin level this am is now below goal. Will increase heparin gtt slightly to 1050 units/hr and check level in 8hr.   08/19/2017 5:12 AM

## 2017-08-19 NOTE — Progress Notes (Signed)
  Speech Language Pathology Treatment: Dysphagia  Patient Details Name: Stanley Robles MRN: [id number] DOB: 02-Aug-1932 Today's Date: 08/19/2017 Time: 2706-2376 SLP Time Calculation (min) (ACUTE ONLY): 26 min  Assessment / Plan / Recommendation Clinical Impression  SLP followed up for ongoing dysphagia management. Note current diet of dysphagia 1 (puree) and honey thick liquids. Pt noted to have left sided drooling upon SLP arrival. Pt alert and fully upright in chair at bedside however suspect mentation to be decreased from baseline in setting of complex medical conditions. Trialed ice chips following oral care with suspected delay in swallow initiation and wet vocal quality exhibited. Pt cued to clear throat and swallow which resulted in clearing of wet voicing. Trialed nectar thick liquids by teaspoon and cup sip without overt s/sx of aspiration. Reduced labial seal noted with nectar thick liquids by cup. Recommend diet upgrade to nectar thick liquids with dysphagia 1 (puree) consistencies. Anticipate pt will continue to improve swallow function with more time. ST to continue to monitor.     HPI HPI: Pt is an 83 yo male admitted with NSTEMI s/p cath which showed severe left main disease and total right coronary artery stenosis, potential CABG planned for 9/14. 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 Was evaluated by ST this admission prior to CABG; MBS 08/05/17 revealed mild oropharyngeal and cervical esophageal dysphagia, suspected to be more chronic in nature given his h/o dysphagia from prior CVA and h/o GERD. Dys 3, thin liquids, no straws recommended with ST to follow briefly through surgery given his history. Pt was intubated for CABG on 9/14, transported back to floor on vent and subsequent extubated at 10pm same day. Pt was consuming Dys 2, nectar > developed ileus > NPO and diagnostic  reassessment this morning.        SLP Plan  Continue with current plan of care       Recommendations  Diet recommendations: Dysphagia 1 (puree);Nectar-thick liquid Liquids provided via: Teaspoon;Cup Medication Administration: Crushed with puree Supervision: Staff to assist with self feeding;Full supervision/cueing for compensatory strategies Compensations: Slow rate;Small sips/bites Postural Changes and/or Swallow Maneuvers: Seated upright 90 degrees                Plan: Continue with current plan of care       GO               Arvil Chaco MA, CCC-SLP Acute Care Speech Language Pathologist    Levi Aland 08/19/2017, 1:06 PM

## 2017-08-19 NOTE — Care Management Note (Signed)
Case Management Note  Patient Details  Name: Stanley Robles MRN: [id number] Date of Birth: [birth year]  Subjective/Objective:  POD 11 CABG , afib with AKI, CRRT stopped 9/24, but cret and bun worse, conts on iv amio, heparin, lasix, iv k run x 4, plan is SNF. CSW following following for placement.                   Action/Plan: NCM will follow along with CSW for dc needs.   Expected Discharge Date:                  Expected Discharge Plan:  Skilled Nursing Facility  In-House Referral:  Clinical Social Work  Discharge planning Services  CM Consult  Post Acute Care Choice:    Choice offered to:     DME Arranged:    DME Agency:     HH Arranged:    [place] Agency:     Status of Service:  In process, will continue to follow  If discussed at Long Length of Stay Meetings, dates discussed:    Additional Comments:  [name], RN 08/19/2017, 5:07 PM

## 2017-08-19 NOTE — Progress Notes (Signed)
[place] CONSULT NOTE   Pharmacy Consult for TPN Indication: Ileus  Patient Measurements: Height: 5\' 7"  (170.2 cm) Weight: 170 lb 3.1 oz (77.2 kg) IBW/kg (Calculated) : 66.1 TPN AdjBW (KG): 76.6 Body mass index is 26.66 kg/m.  Assessment: 83 yo M with CABG x 2 on 9/14 now with acute renal failure and post-op ileus per Dr Prescott Gum.  PMH: CAD, HLD, PVD, AFib, CKD 3, HTN, diastolic HF  GI: Abdominal Xray on 9/21 does not suggest bowel dilatation. Having BMs. Albumin low at 2.3. Prealbumin improved 14.3. Ileus seems to be resolved, CVTS will not start TFs due to high risk of aspiration - requested decreased TPN rate when CRRT off 9/24-9/25. Now dysphagia diet started 9/25, weaning TPN. PPI IV Endo: No hx of DM. CBGs controlled (438 appears to be lab error). SSI Insulin requirements in the past 24 hours: 4 units Lytes: Na down 130, K 3.5 (s/p k runs x 3). Phos up to 5.6 off CRRT (s/p 39mmol Naphos per Renal). CoCa 9.5 (Ca-Phos product ~53). Mg 1.9 *No lytes in TPN Renal: ARF. CRRT 9/20 >> stopped 9/24. SCr up to 3.77 (baseline 1.5-2), BUN up 65 off CRRT. UOP up to 815 cc/24h. No absolute need for HD per Renal. Lasix IV NS and LR both at kvo Pulm: [place] Cards: S/p cabg in afib on amio IV, milrinone off. BP soft, HR 80-90s in afib on lopressor IV. Heparin IV for afib. Asa, statin Hepatobil: LFTs WNL. Tbili/TG wnl Neuro: hycet prn - pain score 6 ID: Zosyn for possible PNA. Afeb, WBC 12 stable  TPN Access: PICC TPN start date: 9/20  Nutritional Goals (per RD recommendation on 9/20): KCal: 1800 - 2000 / day Protein: 120 - 150 g / day  Current Nutrition: dysphagia diet, TPN  Plan:  D/c TPN per CVTS Decrease Clinimix 5/15 (no lytes) to 20 ml/hr at 1600 x 2 hrs, then off D/c TPN orders, labs  K runs x 3 ordered per Renal   Elicia Lamp, PharmD, BCPS Clinical Pharmacist Rx Phone # for today: [phone] After 3:30PM, please call Main Rx:  [phone] 08/19/2017 7:57 AM

## 2017-08-19 NOTE — Progress Notes (Signed)
ANTICOAGULATION CONSULT NOTE - Follow Up Consult  Pharmacy Consult for Heparin  Indication: atrial fibrillation  Allergies  Allergen Reactions  . Lipitor [Atorvastatin] Other (See Comments)    Severe constipation   Patient Measurements: Height: 5\' 7"  (170.2 cm) Weight: 170 lb 3.1 oz (77.2 kg) IBW/kg (Calculated) : 66.1  Vital Signs: Temp: 97.7 F (36.5 C) (09/26 1900) Temp Source: Oral (09/26 1900) BP: 114/62 (09/26 2300) Pulse Rate: 75 (09/26 2300)  Labs:  Recent Labs  08/17/17 0501  08/18/17 0355  08/18/17 1642  08/19/17 0332 08/19/17 1305 08/19/17 1608 08/19/17 2236  HGB 11.1*  < > 9.5*  --  8.8*  --  9.9*  --  10.9*  --   HCT 33.2*  < > 28.2*  --  26.0*  --  29.4*  --  32.0*  --   PLT 162  --  133*  --   --   --  127*  --   --   --   HEPARINUNFRC  --   < > 0.85*  < >  --   < > 0.26* 0.26*  --  0.62  CREATININE 1.42*  < > 2.73*  --  3.40*  --  3.77*  --  4.30*  --   < > = values in this interval not displayed.  Estimated Creatinine Clearance: 12.4 mL/min (A) (by C-G formula based on SCr of 4.3 mg/dL (H)).   Assessment: 83 y/o M POD #12 CABG, on heparin for afib with low goal of 0.3-0.4, heparin level tonight is supra-therapeutic after rate increase, no issues per RN.   Goal of Therapy:  Heparin level 0.3-0.4 units/ml Monitor platelets by anticoagulation protocol: Yes   Plan:  -Dec heparin to 1100 units/hr -0800 HL  Narda Bonds 08/19/2017,11:48 PM

## 2017-08-20 ENCOUNTER — Inpatient Hospital Stay (HOSPITAL_COMMUNITY): Payer: Medicare Other

## 2017-08-20 LAB — COMPREHENSIVE METABOLIC PANEL
ALT: 37 U/L (ref 17–63)
AST: 26 U/L (ref 15–41)
Albumin: 2.2 g/dL — ABNORMAL LOW (ref 3.5–5.0)
Alkaline Phosphatase: 81 U/L (ref 38–126)
Anion gap: 18 — ABNORMAL HIGH (ref 5–15)
BUN: 73 mg/dL — ABNORMAL HIGH (ref 6–20)
CO2: 15 mmol/L — ABNORMAL LOW (ref 22–32)
Calcium: 8.7 mg/dL — ABNORMAL LOW (ref 8.9–10.3)
Chloride: 98 mmol/L — ABNORMAL LOW (ref 101–111)
Creatinine, Ser: 4.43 mg/dL — ABNORMAL HIGH (ref 0.61–1.24)
GFR calc Af Amer: 13 mL/min — ABNORMAL LOW (ref >60)
GFR calc non Af Amer: 11 mL/min — ABNORMAL LOW (ref >60)
Glucose, Bld: 91 mg/dL (ref 65–99)
Potassium: 3.7 mmol/L (ref 3.5–5.1)
Sodium: 131 mmol/L — ABNORMAL LOW (ref 135–145)
Total Bilirubin: 0.7 mg/dL (ref 0.3–1.2)
Total Protein: 5.8 g/dL — ABNORMAL LOW (ref 6.5–8.1)

## 2017-08-20 LAB — CBC
HCT: 28.6 % — ABNORMAL LOW (ref 39.0–52.0)
Hemoglobin: 9.6 g/dL — ABNORMAL LOW (ref 13.0–17.0)
MCH: 33 pg (ref 26.0–34.0)
MCHC: 33.6 g/dL (ref 30.0–36.0)
MCV: 98.3 fL (ref 78.0–100.0)
Platelets: 112 10*3/uL — ABNORMAL LOW (ref 150–400)
RBC: 2.91 MIL/uL — ABNORMAL LOW (ref 4.22–5.81)
RDW: 16.9 % — ABNORMAL HIGH (ref 11.5–15.5)
WBC: 9.5 10*3/uL (ref 4.0–10.5)

## 2017-08-20 LAB — GLUCOSE, CAPILLARY
GLUCOSE-CAPILLARY: 106 mg/dL — AB (ref 65–99)
GLUCOSE-CAPILLARY: 122 mg/dL — AB (ref 65–99)
GLUCOSE-CAPILLARY: 89 mg/dL (ref 65–99)
Glucose-Capillary: 97 mg/dL (ref 65–99)

## 2017-08-20 LAB — HEPARIN LEVEL (UNFRACTIONATED)
Heparin Unfractionated: 0.62 IU/mL (ref 0.30–0.70)
Heparin Unfractionated: 0.37 IU/mL (ref 0.30–0.70)

## 2017-08-20 MED ORDER — POTASSIUM CHLORIDE 20 MEQ/15ML (10%) PO SOLN
20.0000 meq | Freq: Once | ORAL | Status: AC
  Administered 2017-08-20: 20 meq via ORAL
  Filled 2017-08-20: qty 15

## 2017-08-20 MED ORDER — WARFARIN - PHYSICIAN DOSING INPATIENT
Freq: Every day | Status: DC
  Administered 2017-08-21: 18:00:00

## 2017-08-20 MED ORDER — WARFARIN SODIUM 2.5 MG PO TABS
2.5000 mg | ORAL_TABLET | Freq: Once | ORAL | Status: AC
  Administered 2017-08-20: 2.5 mg via ORAL
  Filled 2017-08-20: qty 1

## 2017-08-20 MED ORDER — SODIUM BICARBONATE 8.4 % IV SOLN
50.0000 meq | Freq: Once | INTRAVENOUS | Status: AC
Start: 2017-08-20 — End: 2017-08-20
  Administered 2017-08-20: 50 meq via INTRAVENOUS
  Filled 2017-08-20: qty 50

## 2017-08-20 NOTE — Progress Notes (Addendum)
ANTICOAGULATION CONSULT NOTE - Follow Up Consult  Pharmacy Consult for Heparin  Indication: atrial fibrillation  Allergies  Allergen Reactions  . Lipitor [Atorvastatin] Other (See Comments)    Severe constipation   Patient Measurements: Height: 5\' 7"  (170.2 cm) Weight: 170 lb 3.1 oz (77.2 kg) IBW/kg (Calculated) : 66.1  Vital Signs: Temp: 97 F (36.1 C) (09/27 1205) Temp Source: Axillary (09/27 1205) BP: 105/41 (09/27 1300) Pulse Rate: 75 (09/27 1300)  Labs:  Recent Labs  08/18/17 0355  08/19/17 0332 08/19/17 1305 08/19/17 1608 08/19/17 2236 08/20/17 0430 08/20/17 1156  HGB 9.5*  < > 9.9*  --  10.9*  --  9.6*  --   HCT 28.2*  < > 29.4*  --  32.0*  --  28.6*  --   PLT 133*  --  127*  --   --   --  112*  --   HEPARINUNFRC 0.85*  < > 0.26* 0.26*  --  0.62  --  0.62  CREATININE 2.73*  < > 3.77*  --  4.30*  --  4.43*  --   < > = values in this interval not displayed.  Estimated Creatinine Clearance: 12 mL/min (A) (by C-G formula based on SCr of 4.43 mg/dL (H)).   Assessment: 83yo male with history of PAF supratherapeutic x 2. RN said drawn appropriately. Now bridging to warfarin per physician.   Hgb and plts downtrending and no bleeding reported.   Goal of Therapy:  Heparin level 0.3-0.4 units/ml Monitor platelets by anticoagulation protocol: Yes   Plan:  -Dec heparin to 900 units/hr -8hr HL -Warfarin 2.5 mg po x 1 tonight per MD dosing  Leroy Libman, PharmD Pharmacy Resident Pager: [phone]

## 2017-08-20 NOTE — Progress Notes (Signed)
  Speech Language Pathology Treatment: Dysphagia  Patient Details Name: Stanley Robles MRN: [id number] DOB: 1932/08/23 Today's Date: 08/20/2017 Time: 6962-9528 SLP Time Calculation (min) (ACUTE ONLY): 23 min  Assessment / Plan / Recommendation Clinical Impression  Pt continues to be at increased risk for aspiration in setting of multiple medical comorbidities and clinical symptoms at bedside with upgraded PO trials. Nursing reports limited intake at meals, pt also exhibiting disinterest in eating and drinking per last two encounters with SLP. Pt with improved labial seal this date with use of cup, upright at bedside. Suspect delay in swallow initiation through palpation. Pt consumed cup sips of thin liquids with initial clear vocal quality however explosive cough exhibited 30 seconds later with wet vocal quality and suspected secretions. Pt did not have a productive cough this date though he sounded congested. Trialed soft solids, pt stated "I don't have enough teeth to chew this". Recommend continue conservative dysphagia 1 (puree) and nectar thick liquids with  medicines whole in puree. ST to continue PO trials for readiness for diet upgrade.      HPI HPI: Pt is an 83 yo male admitted with NSTEMI s/p cath which showed severe left main disease and total right coronary artery stenosis, potential CABG planned for 9/14. 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 Was evaluated by ST this admission prior to CABG; MBS 08/05/17 revealed mild oropharyngeal and cervical esophageal dysphagia, suspected to be more chronic in nature given his h/o dysphagia from prior CVA and h/o GERD. Dys 3, thin liquids, no straws recommended with ST to follow briefly through surgery given his history. Pt was intubated for CABG on 9/14, transported back to floor on vent and subsequent extubated at 10pm same day. Pt was consuming Dys  2, nectar > developed ileus > NPO and diagnostic reassessment this morning.        SLP Plan  Continue with current plan of care       Recommendations  Diet recommendations: Dysphagia 1 (puree);Nectar-thick liquid Liquids provided via: Teaspoon;Cup Medication Administration: Whole meds with puree Supervision: Staff to assist with self feeding;Full supervision/cueing for compensatory strategies Compensations: Slow rate;Small sips/bites Postural Changes and/or Swallow Maneuvers: Seated upright 90 degrees                SLP Visit Diagnosis: Dysphagia, unspecified (R13.10) Plan: Continue with current plan of care       [place] MA, [facility] Speech Language Pathologist    Levi Stanley Robles 08/20/2017, 12:43 PM

## 2017-08-20 NOTE — Progress Notes (Signed)
13 Days Post-Op Procedure(s) (LRB): CORONARY ARTERY BYPASS GRAFTING (CABG) x 2 WITH ENDOSCOPIC HARVESTING OF RIGHT SAPHENOUS VEIN. (N/A) TRANSESOPHAGEAL ECHOCARDIOGRAM (TEE) (N/A) Subjective: Patient's acute on chronic renal failure improved with excellent urine output and plateaued creatinine at 4.2 Maintaining sinus rhythm Transition from IV heparin to oral warfarin for his A. fib while in hospital he needs mobilization ambulation physical therapy and needs to be stronger before he is transferred to stepdown- we'll ask for CIR consult as well  Objective: Vital signs in last 24 hours: Temp:  [97 F (36.1 C)-98.5 F (36.9 C)] 98.5 F (36.9 C) (09/27 1624) Pulse Rate:  [75-89] 75 (09/27 1300) Cardiac Rhythm: Normal sinus rhythm (09/27 1300) Resp:  [12-29] 18 (09/27 1300) BP: (97-147)/(37-84) 105/41 (09/27 1300) SpO2:  [95 %-100 %] 100 % (09/27 1300) Weight:  [170 lb 3.1 oz (77.2 kg)] 170 lb 3.1 oz (77.2 kg) (09/27 0600)  Hemodynamic parameters for last 24 hours: CVP:  [4 mmHg-6 mmHg] 4 mmHg  Intake/Output from previous day: 09/26 0701 - 09/27 0700 In: 1340.3 [I.V.:990.3; IV Piggyback:350] Out: 2225 [Urine:2225] Intake/Output this shift: Total I/O In: 393.9 [P.O.:200; I.V.:193.9] Out: 540 [Urine:540]       Exam    General- alert and comfortable, hard of hearing and chronically ill-appearing   Lungs- clear without rales, wheezes   Cor- regular rate and rhythm, no murmur , gallop   Abdomen- soft, non-tender   Extremities - warm, non-tender, minimal edema   Neuro- oriented, appropriate, no focal weakness   Lab Results:  Recent Labs  08/19/17 0332 08/19/17 1608 08/20/17 0430  WBC 12.0*  --  9.5  HGB 9.9* 10.9* 9.6*  HCT 29.4* 32.0* 28.6*  PLT 127*  --  112*   BMET:  Recent Labs  08/19/17 0332 08/19/17 1608 08/20/17 0430  NA 130* 130* 131*  K 3.5 4.0 3.7  CL 96* 98* 98*  CO2 20*  --  15*  GLUCOSE 103* 101* 91  BUN 65* 67* 73*  CREATININE 3.77* 4.30* 4.43*   CALCIUM 8.3*  --  8.7*    PT/INR: No results for input(s): LABPROT, INR in the last 72 hours. ABG    Component Value Date/Time   PHART 7.581 (H) 08/17/2017 0325   HCO3 20.1 08/17/2017 0325   TCO2 19 (L) 08/19/2017 1608   ACIDBASEDEF 1.8 08/17/2017 0325   O2SAT 51.7 08/19/2017 0459   CBG (last 3)   Recent Labs  08/20/17 0744 08/20/17 1208 08/20/17 1622  GLUCAP 106* 122* 89    Assessment/Plan: S/P Procedure(s) (LRB): CORONARY ARTERY BYPASS GRAFTING (CABG) x 2 WITH ENDOSCOPIC HARVESTING OF RIGHT SAPHENOUS VEIN. (N/A) TRANSESOPHAGEAL ECHOCARDIOGRAM (TEE) (N/A) Mobilize Diuresis Diabetes control Start by mouth Coumadin   LOS: 20 days    Tharon Aquas Trigt III 08/20/2017

## 2017-08-20 NOTE — Progress Notes (Addendum)
Subjective:   Much more alert and conversant.  Making urine now.  Currently back in sinus.   Objective:  Vital Signs in the last 24 hours: BP (!) 105/41   Pulse 75   Temp (!) 97 F (36.1 C) (Axillary)   Resp 18   Ht 5\' 7"  (1.702 m)   Wt 77.2 kg (170 lb 3.1 oz)   SpO2 100%   BMI 26.66 kg/m   Physical Exam: Elderly WM in NAD, HOH Lungs:  Reduced BS Cardiac:  regular rhythm, normal S1 and S2, no S3 Extremities:  No edema present  Intake/Output from previous day: 09/26 0701 - 09/27 0700 In: 1340.3 [I.V.:990.3; IV Piggyback:350] Out: 2225 [Urine:2225] Weight Filed Weights   08/18/17 0500 08/19/17 0600 08/20/17 0600  Weight: 73.8 kg (162 lb 11.2 oz) 77.2 kg (170 lb 3.1 oz) 77.2 kg (170 lb 3.1 oz)    Lab Results: Basic Metabolic Panel:  Recent Labs  08/19/17 0332 08/19/17 1608 08/20/17 0430  NA 130* 130* 131*  K 3.5 4.0 3.7  CL 96* 98* 98*  CO2 20*  --  15*  GLUCOSE 103* 101* 91  BUN 65* 67* 73*  CREATININE 3.77* 4.30* 4.43*    CBC:  Recent Labs  08/19/17 0332 08/19/17 1608 08/20/17 0430  WBC 12.0*  --  9.5  HGB 9.9* 10.9* 9.6*  HCT 29.4* 32.0* 28.6*  MCV 99.3  --  98.3  PLT 127*  --  112*    BNP    Component Value Date/Time   BNP 560.8 (H) 08/01/2017 0057    Telemetry: Currently sinus with rate 72  Assessment/Plan:  1. Recent CABG 2. ARF on top of chronic but  UOP now increasing, Cr. Still going up 3. Prior stroke and dysphagia 4. Atrial fibrillation currently in sinus rhythm    He DOES NOT HAVE  chronic atrial fib but paroxysmal. Maintaining sinus now.  Might change to oral amiodarone soon.     Kerry Hough  MD [facility]  08/20/2017, 1:48 PM

## 2017-08-20 NOTE — Progress Notes (Signed)
ANTICOAGULATION CONSULT NOTE - Follow Up Consult  Pharmacy Consult for Heparin  Indication: atrial fibrillation  Allergies  Allergen Reactions  . Lipitor [Atorvastatin] Other (See Comments)    Severe constipation   Patient Measurements: Height: 5\' 7"  (170.2 cm) Weight: 170 lb 3.1 oz (77.2 kg) IBW/kg (Calculated) : 66.1  Vital Signs: Temp: 98.9 F (37.2 C) (09/27 2000) Temp Source: Oral (09/27 2000) BP: 118/41 (09/27 2100) Pulse Rate: 77 (09/27 2100)  Labs:  Recent Labs  08/18/17 0355  08/19/17 0332  08/19/17 1608 08/19/17 2236 08/20/17 0430 08/20/17 1156 08/20/17 2135  HGB 9.5*  < > 9.9*  --  10.9*  --  9.6*  --   --   HCT 28.2*  < > 29.4*  --  32.0*  --  28.6*  --   --   PLT 133*  --  127*  --   --   --  112*  --   --   HEPARINUNFRC 0.85*  < > 0.26*  < >  --  0.62  --  0.62 0.37  CREATININE 2.73*  < > 3.77*  --  4.30*  --  4.43*  --   --   < > = values in this interval not displayed.  Estimated Creatinine Clearance: 12 mL/min (A) (by C-G formula based on SCr of 4.43 mg/dL (H)).   Assessment: 83yo male with history of PAF on IV heparin drip 900 units/hr. Heparin level is 0.37, herapeutic after rate decreased earlier today. No bleeding reported. Goal of Therapy:  Heparin level 0.3-0.4 units/ml Monitor platelets by anticoagulation protocol: Yes   Plan:  Continue heparin IV 900 units/hr Daily HL, CBC.  MD dosing Warfarin   Nicole Cella, RPh Clinical Pharmacist 08/20/2017 10:22 PM

## 2017-08-20 NOTE — Progress Notes (Signed)
Every other staple removed from right chest site.

## 2017-08-20 NOTE — Progress Notes (Signed)
Patient ID: Stanley Robles, male   DOB: 1932-04-25, 83 y.o.   MRN: [id number]  SICU Evening Rounds  Hemodynamically stable in sinus rhythm on IV amio  BP (!) 105/41   Pulse 75   Temp 98.5 F (36.9 C) (Oral)   Resp 18   Ht 5\' 7"  (1.702 m)   Wt 77.2 kg (170 lb 3.1 oz)   SpO2 100%   BMI 26.66 kg/m   Urine output 60-90/hr

## 2017-08-20 NOTE — Progress Notes (Signed)
Subjective:  UOP 2200  !  - up from 815 day before   Objective Vital signs in last 24 hours: Vitals:   08/20/17 0500 08/20/17 0600 08/20/17 0609 08/20/17 0700  BP: (!) 106/52  (!) 147/53 (!) 118/42  Pulse: 75  88 79  Resp: (!) 22  (!) 25 (!) 22  Temp:      TempSrc:      SpO2: 100%  100% 100%  Weight:  77.2 kg (170 lb 3.1 oz)    Height:       Weight change: 0 kg (0 lb)  Intake/Output Summary (Last 24 hours) at 08/20/17 8469 Last data filed at 08/20/17 0615  Gross per 24 hour  Intake          1340.26 ml  Output             2225 ml  Net          -884.74 ml    Assessment/ Plan: Pt is a 83 y.o. yo male with CKD who was admitted on 07/31/2017 now  POD 11 from CABG- c/b A on CRF  Assessment/Plan: 1. Renal- dialysis dependant A on CRF - baseline crt 1.5-2.  AKI likely due to ATN after CABG- UOP not great but increasing- on CRRT from 9/20-9/24, no RRT for 3 days now. Not uremic.   I am not sure of renal function prognosis- I dont think he can comprehend what this may mean for him, spoke to daughter Stanley Robles- they of course are hoping that the dialysis is something he will not need long term but would be accepting of that if needed is the impression I got.  BUN and creatinine slightly worse today but rate of rise slowing and no absolute need for HD - UOP picking up I hope is a good sign- leave vascath in for now 2. HTN/vol- had been tolerating UF at  2200 per 24 hours while on CRRT -  not too overloaded with CVP 5-7- have added diuretic, making good urine- cont to watch UOP 3. Anemia- hgb over 11 but now in the 9's - added ESA 4. Secondary hyperparathyroidism- phos if anything low- repleted 9/24- is up now- no action 5. Hypo K- repleted yest, will give low dose today given a fib issues- now passed swallowing eval   6. Hyponatremia- argues for volume excess- cont diuretics - better    Stanley Robles A    Labs: Basic Metabolic Panel:  Recent Labs Lab 08/17/17 1610 08/18/17 0355   08/19/17 0332 08/19/17 1608 08/20/17 0430  NA 132* 131*  < > 130* 130* 131*  K 3.2* 3.3*  < > 3.5 4.0 3.7  CL 100* 97*  < > 96* 98* 98*  CO2 18* 21*  --  20*  --  15*  GLUCOSE 143* 117*  < > 103* 101* 91  BUN 32* 46*  < > 65* 67* 73*  CREATININE 1.96* 2.73*  < > 3.77* 4.30* 4.43*  CALCIUM 8.0* 8.0*  --  8.3*  --  8.7*  PHOS 6.0* 5.4*  --  5.6*  --   --   < > = values in this interval not displayed. Liver Function Tests:  Recent Labs Lab 08/18/17 0355 08/19/17 0332 08/20/17 0430  AST 46* 38 26  ALT 33 36 37  ALKPHOS 82 83 81  BILITOT 1.0 0.9 0.7  PROT 5.4* 5.7* 5.8*  ALBUMIN 2.3* 2.3* 2.2*   No results for input(s): LIPASE, AMYLASE in the last 168 hours. No  results for input(s): AMMONIA in the last 168 hours. CBC:  Recent Labs Lab 08/14/17 0259 08/17/17 0501  08/18/17 0355  08/19/17 0332 08/19/17 1608 08/20/17 0430  WBC 7.9 13.1*  --  11.7*  --  12.0*  --  9.5  NEUTROABS 6.2 10.0*  --   --   --   --   --   --   HGB 9.2* 11.1*  < > 9.5*  < > 9.9* 10.9* 9.6*  HCT 26.9* 33.2*  < > 28.2*  < > 29.4* 32.0* 28.6*  MCV 97.8 99.4  --  99.3  --  99.3  --  98.3  PLT 108* 162  --  133*  --  127*  --  PENDING  < > = values in this interval not displayed. Cardiac Enzymes: No results for input(s): CKTOTAL, CKMB, CKMBINDEX, TROPONINI in the last 168 hours. CBG:  Recent Labs Lab 08/18/17 1703 08/18/17 2354 08/19/17 0602 08/19/17 1140 08/19/17 2216  GLUCAP 117* 129* 99 137* 93    Iron Studies: No results for input(s): IRON, TIBC, TRANSFERRIN, FERRITIN in the last 72 hours. Studies/Results: No results found. Medications: Infusions: . sodium chloride Stopped (08/08/17 0900)  . sodium chloride    . sodium chloride 20 mL (08/17/17 2044)  . sodium chloride    . sodium chloride    . amiodarone 30 mg/hr (08/19/17 2030)  . heparin 1,100 Units/hr (08/20/17 0223)  . lactated ringers 10 mL/hr at 08/10/17 0800  . piperacillin-tazobactam (ZOSYN)  IV Stopped (08/20/17 0645)     Scheduled Medications: . allopurinol  200 mg Oral Daily  . aspirin EC  325 mg Oral Daily   Or  . aspirin  324 mg Per Tube Daily  . bisacodyl  10 mg Oral Daily   Or  . bisacodyl  10 mg Rectal Daily  . chlorhexidine  15 mL Mouth Rinse BID  . Chlorhexidine Gluconate Cloth  6 each Topical Q0600  . darbepoetin (ARANESP) injection - NON-DIALYSIS  60 mcg Subcutaneous Q Tue-1800  . docusate sodium  200 mg Oral Daily  . feeding supplement (ENSURE ENLIVE)  237 mL Oral BID BM  . fentaNYL  12.5 mcg Transdermal Q72H  . finasteride  5 mg Oral Daily  . furosemide  80 mg Intravenous Q12H  . insulin aspart  0-9 Units Subcutaneous TID WC  . mouth rinse  15 mL Mouth Rinse q12n4p  . metoprolol tartrate  5 mg Intravenous Q8H  . pantoprazole (PROTONIX) IV  40 mg Intravenous Q24H  . rosuvastatin  10 mg Oral q1800  . sodium bicarbonate  50 mEq Intravenous Once  . sodium chloride flush  10-40 mL Intracatheter Q12H  . sodium chloride flush  3 mL Intravenous Q12H  . warfarin  2.5 mg Oral ONCE-1800  . Warfarin - Physician Dosing Inpatient   Does not apply q1800    have reviewed scheduled and prn medications.  Physical Exam: General: communicate via writing board- no c/o's today  Heart: HR better Lungs: prolonged exp phase Abdomen: soft, non tender Extremities: minimal edema Dialysis Access: vascath placed 9/19    08/20/2017,7:33 AM  LOS: 20 days

## 2017-08-21 ENCOUNTER — Inpatient Hospital Stay (HOSPITAL_COMMUNITY): Payer: Medicare Other

## 2017-08-21 LAB — GLUCOSE, CAPILLARY
GLUCOSE-CAPILLARY: 94 mg/dL (ref 65–99)
GLUCOSE-CAPILLARY: 132 mg/dL — AB (ref 65–99)
GLUCOSE-CAPILLARY: 109 mg/dL — AB (ref 65–99)
Glucose-Capillary: 111 mg/dL — ABNORMAL HIGH (ref 65–99)

## 2017-08-21 LAB — POCT I-STAT, CHEM 8
BUN: 86 mg/dL — ABNORMAL HIGH (ref 6–20)
Calcium, Ion: 1.22 mmol/L (ref 1.15–1.40)
Chloride: 98 mmol/L — ABNORMAL LOW (ref 101–111)
Creatinine, Ser: 5.1 mg/dL — ABNORMAL HIGH (ref 0.61–1.24)
GLUCOSE: 109 mg/dL — AB (ref 65–99)
HEMATOCRIT: 28 % — AB (ref 39.0–52.0)
HEMOGLOBIN: 9.5 g/dL — AB (ref 13.0–17.0)
POTASSIUM: 3.9 mmol/L (ref 3.5–5.1)
Sodium: 134 mmol/L — ABNORMAL LOW (ref 135–145)
TCO2: 22 mmol/L (ref 22–32)

## 2017-08-21 LAB — CBC
HCT: 27.6 % — ABNORMAL LOW (ref 39.0–52.0)
Hemoglobin: 9.5 g/dL — ABNORMAL LOW (ref 13.0–17.0)
MCH: 33.8 pg (ref 26.0–34.0)
MCHC: 34.4 g/dL (ref 30.0–36.0)
MCV: 98.2 fL (ref 78.0–100.0)
Platelets: 114 10*3/uL — ABNORMAL LOW (ref 150–400)
RBC: 2.81 MIL/uL — ABNORMAL LOW (ref 4.22–5.81)
RDW: 16.9 % — ABNORMAL HIGH (ref 11.5–15.5)
WBC: 6.1 10*3/uL (ref 4.0–10.5)

## 2017-08-21 LAB — COMPREHENSIVE METABOLIC PANEL
ALT: 34 U/L (ref 17–63)
AST: 23 U/L (ref 15–41)
Albumin: 2.3 g/dL — ABNORMAL LOW (ref 3.5–5.0)
Alkaline Phosphatase: 80 U/L (ref 38–126)
Anion gap: 11 (ref 5–15)
BUN: 79 mg/dL — ABNORMAL HIGH (ref 6–20)
CO2: 24 mmol/L (ref 22–32)
Calcium: 8.9 mg/dL (ref 8.9–10.3)
Chloride: 98 mmol/L — ABNORMAL LOW (ref 101–111)
Creatinine, Ser: 4.98 mg/dL — ABNORMAL HIGH (ref 0.61–1.24)
GFR calc Af Amer: 11 mL/min — ABNORMAL LOW (ref >60)
GFR calc non Af Amer: 10 mL/min — ABNORMAL LOW (ref >60)
Glucose, Bld: 91 mg/dL (ref 65–99)
Potassium: 3.7 mmol/L (ref 3.5–5.1)
Sodium: 133 mmol/L — ABNORMAL LOW (ref 135–145)
Total Bilirubin: 0.8 mg/dL (ref 0.3–1.2)
Total Protein: 5.7 g/dL — ABNORMAL LOW (ref 6.5–8.1)

## 2017-08-21 LAB — PROTIME-INR
INR: 1.51
Prothrombin Time: 18.1 seconds — ABNORMAL HIGH (ref 11.4–15.2)

## 2017-08-21 LAB — HEPARIN LEVEL (UNFRACTIONATED): Heparin Unfractionated: 0.4 IU/mL (ref 0.30–0.70)

## 2017-08-21 MED ORDER — WARFARIN SODIUM 2.5 MG PO TABS
2.5000 mg | ORAL_TABLET | Freq: Every day | ORAL | Status: AC
  Administered 2017-08-21: 2.5 mg via ORAL
  Filled 2017-08-21: qty 1

## 2017-08-21 MED ORDER — METOPROLOL TARTRATE 12.5 MG HALF TABLET
12.5000 mg | ORAL_TABLET | Freq: Two times a day (BID) | ORAL | Status: DC
  Administered 2017-08-21 – 2017-08-28 (×15): 12.5 mg via ORAL
  Filled 2017-08-21 (×15): qty 1

## 2017-08-21 MED ORDER — AMIODARONE HCL 200 MG PO TABS
200.0000 mg | ORAL_TABLET | Freq: Two times a day (BID) | ORAL | Status: DC
  Administered 2017-08-21 – 2017-08-27 (×14): 200 mg via ORAL
  Filled 2017-08-21 (×14): qty 1

## 2017-08-21 MED ORDER — SORBITOL 70 % PO SOLN
15.0000 mL | Freq: Once | ORAL | Status: DC
  Filled 2017-08-21: qty 15

## 2017-08-21 MED ORDER — CHLORHEXIDINE GLUCONATE CLOTH 2 % EX PADS
6.0000 | MEDICATED_PAD | Freq: Every day | CUTANEOUS | Status: DC
  Administered 2017-08-22 – 2017-08-27 (×4): 6 via TOPICAL

## 2017-08-21 NOTE — Progress Notes (Signed)
14 Days Post-Op Procedure(s) (LRB): CORONARY ARTERY BYPASS GRAFTING (CABG) x 2 WITH ENDOSCOPIC HARVESTING OF RIGHT SAPHENOUS VEIN. (N/A) TRANSESOPHAGEAL ECHOCARDIOGRAM (TEE) (N/A) Subjective: Patient walked over 150 feet earlier this morning with assistance Intermittent atrial fibrillation with stable blood pressure Excellent urine output showing oh for recovery after acute on chronic renal failure Transitioning from IV heparin to Coumadin for intermittent A. Fib Hemoglobin stable, chest x-ray stable We'll consult inpatient rehabilitation once renal function appears stable  Objective: Vital signs in last 24 hours: Temp:  [97 F (36.1 C)-98.9 F (37.2 C)] 97.6 F (36.4 C) (09/28 0758) Pulse Rate:  [75-113] 113 (09/28 0900) Cardiac Rhythm: Normal sinus rhythm (09/28 0800) Resp:  [5-26] 20 (09/28 0900) BP: (99-148)/(40-68) 120/50 (09/28 0900) SpO2:  [100 %] 100 % (09/28 0900) Weight:  [165 lb 2 oz (74.9 kg)] 165 lb 2 oz (74.9 kg) (09/28 0600)  Hemodynamic parameters for last 24 hours:  afebrile  Intake/Output from previous day: 09/27 0701 - 09/28 0700 In: 835.8 [P.O.:200; I.V.:635.8] Out: 1940 [JXBJY:7829] Intake/Output this shift: Total I/O In: 139.9 [P.O.:50; I.V.:89.9] Out: 230 [Urine:230]  Exam  Frail chronically ill-appearing 83 year old out of bed in a recliner Intermittent atrial fibrillation without murmur Minimal edema Surgical incisions healing  Lab Results:  Recent Labs  08/20/17 0430 08/21/17 0347  WBC 9.5 6.1  HGB 9.6* 9.5*  HCT 28.6* 27.6*  PLT 112* 114*   BMET:  Recent Labs  08/20/17 0430 08/21/17 0347  NA 131* 133*  K 3.7 3.7  CL 98* 98*  CO2 15* 24  GLUCOSE 91 91  BUN 73* 79*  CREATININE 4.43* 4.98*  CALCIUM 8.7* 8.9    PT/INR:  Recent Labs  08/21/17 0347  LABPROT 18.1*  INR 1.51   ABG    Component Value Date/Time   PHART 7.581 (H) 08/17/2017 0325   HCO3 20.1 08/17/2017 0325   TCO2 19 (L) 08/19/2017 1608   ACIDBASEDEF 1.8  08/17/2017 0325   O2SAT 51.7 08/19/2017 0459   CBG (last 3)   Recent Labs  08/20/17 1622 08/20/17 2156 08/21/17 0752  GLUCAP 89 97 94    Assessment/Plan: S/P Procedure(s) (LRB): CORONARY ARTERY BYPASS GRAFTING (CABG) x 2 WITH ENDOSCOPIC HARVESTING OF RIGHT SAPHENOUS VEIN. (N/A) TRANSESOPHAGEAL ECHOCARDIOGRAM (TEE) (N/A) Lasix per nephrology  Transition from IV heparin to Coumadin when INR approaches 1.8  po amiodarone and metoprolol   still needs monitoring and assistance provided by ICU care.   LOS: 21 days    Tharon Aquas Trigt III 08/21/2017

## 2017-08-21 NOTE — Progress Notes (Signed)
Patient ID: Stanley Robles, male   DOB: [birth year], 83 y.o.   MRN: [id number] EVENING ROUNDS NOTE :     [address]       [employer] [id number]             [phone]                 14 Days Post-Op Procedure(s) (LRB): CORONARY ARTERY BYPASS GRAFTING (CABG) x 2 WITH ENDOSCOPIC HARVESTING OF RIGHT SAPHENOUS VEIN. (N/A) TRANSESOPHAGEAL ECHOCARDIOGRAM (TEE) (N/A)  Total Length of Stay:  LOS: 21 days  BP (!) 97/57   Pulse 99   Temp (!) 96.7 F (35.9 C) (Axillary)   Resp 16   Ht 5\' 7"  (1.702 m)   Wt 165 lb 2 oz (74.9 kg)   SpO2 100%   BMI 25.86 kg/m   .Intake/Output      09/28 0701 - 09/29 0700   P.O. 170   I.V. (mL/kg) 189.9 (2.5)   Total Intake(mL/kg) 359.9 (4.8)   Urine (mL/kg/hr) 605 (0.5)   Total Output 605   Net -245.1       Stool Occurrence 1 x     . sodium chloride Stopped (08/08/17 0900)  . sodium chloride    . sodium chloride 20 mL (08/17/17 2044)  . sodium chloride    . sodium chloride    . heparin 900 Units/hr (08/21/17 1900)  . lactated ringers 10 mL/hr at 08/10/17 0800     Lab Results  Component Value Date   WBC 6.1 08/21/2017   HGB 9.5 (L) 08/21/2017   HCT 28.0 (L) 08/21/2017   PLT 114 (L) 08/21/2017   GLUCOSE 109 (H) 08/21/2017   CHOL 162 02/28/2015   TRIG 104 08/17/2017   HDL 31 (L) 02/28/2015   LDLCALC 102 (H) 02/28/2015   ALT 34 08/21/2017   AST 23 08/21/2017   NA 134 (L) 08/21/2017   K 3.9 08/21/2017   CL 98 (L) 08/21/2017   CREATININE 5.10 (H) 08/21/2017   BUN 86 (H) 08/21/2017   CO2 24 08/21/2017   TSH 3.035 08/04/2017   INR 1.51 08/21/2017   HGBA1C 5.3 08/04/2017   Post o-p cabg  Cr 5.1 Frail slow progress   Grace Isaac MD  Beeper [phone] Office [phone] 08/21/2017 10:22 PM

## 2017-08-21 NOTE — Progress Notes (Signed)
Physical Therapy Treatment Patient Details Name: Stanley Robles MRN: [id number] DOB: December 17, 1931 Today's Date: 08/21/2017    History of Present Illness 83 yo admitted with NSTEMI s/p cath which showed severe left main disease and total right coronary artery stenosis, s/p CABG 08/08/17. experienced acute renal failure Placed on CRRT 08/12/17 removed from CRRT 08/17/17 PMhx:  CVA, sensorineural hearing loss, HLD, PVD, pAF, CKD stage 3, HTN, NSTEMI, chronic diastolic heart failure    PT Comments    Pt continues to make good progress towards his goals. Pt currently maxAx2 for bed mobility, minAx2 for transfers and modA for ambulation of 150 feet with EVA walker.Pt requires skilled PT to progress mobility and improve strength and endurance to safely navigate their discharge environment.     Follow Up Recommendations  SNF     Equipment Recommendations  Other (comment) (to be determined at next venue)    Recommendations for Other Services       Precautions / Restrictions Precautions Precautions: Fall;Sternal Restrictions Weight Bearing Restrictions: Yes (sternal precautions)    Mobility  Bed Mobility Overal bed mobility: Needs Assistance Bed Mobility: Sit to Supine       Sit to supine: Max assist;+2 for physical assistance   General bed mobility comments: maxA for management of LE against gravity back into bed, and trunk to bed surface  Transfers Overall transfer level: Needs assistance Equipment used: Rolling walker (2 wheeled) Transfers: Sit to/from Stand Sit to Stand: +2 physical assistance;Min assist         General transfer comment: minAx2 for power up, vc for maintaining sternal precautions  Ambulation/Gait Ambulation/Gait assistance: Mod assist Ambulation Distance (Feet): 150 Feet Assistive device:  (EVA walker) Gait Pattern/deviations: Step-to pattern;Shuffle;Narrow base of support Gait velocity: slowed Gait velocity interpretation: Below normal speed for  age/gender General Gait Details: ModA for steadying with EVA walker, vc for upright posture and increased base of support         Balance Overall balance assessment: Needs assistance Sitting-balance support: Feet supported;Bilateral upper extremity supported Sitting balance-Leahy Scale: Fair Sitting balance - Comments: able to maintain seated balance at edge of recliner   Standing balance support: Bilateral upper extremity supported Standing balance-Leahy Scale: Poor Standing balance comment: requires modA to maintain standing with EVA walker support                            Cognition Arousal/Alertness: Awake/alert Behavior During Therapy: WFL for tasks assessed/performed Overall Cognitive Status: Within Functional Limits for tasks assessed                                           General Comments General comments (skin integrity, edema, etc.): HR max with ambulation 120bpm, SaO2 on RA >93%O2 with ambulation      Pertinent Vitals/Pain Pain Assessment: Faces Faces Pain Scale: Hurts little more Pain Location: back Pain Descriptors / Indicators: Constant;Aching Pain Intervention(s): Monitored during session;Limited activity within patient's tolerance           PT Goals (current goals can now be found in the care plan section) Acute Rehab PT Goals PT Goal Formulation: With patient Time For Goal Achievement: 08/18/17 Potential to Achieve Goals: Fair Progress towards PT goals: Progressing toward goals    Frequency    Min 3X/week      PT Plan Current plan remains appropriate  AM-PAC PT "6 Clicks" Daily Activity  Outcome Measure  Difficulty turning over in bed (including adjusting bedclothes, sheets and blankets)?: Unable Difficulty moving from lying on back to sitting on the side of the bed? : Unable Difficulty sitting down on and standing up from a chair with arms (e.g., wheelchair, bedside commode, etc,.)?: Unable Help  needed moving to and from a bed to chair (including a wheelchair)?: A Lot Help needed walking in hospital room?: A Lot Help needed climbing 3-5 steps with a railing? : Total 6 Click Score: 8    End of Session Equipment Utilized During Treatment: Gait belt Activity Tolerance: Patient limited by fatigue Patient left: with nursing/sitter in room;in bed;with call bell/phone within reach Nurse Communication: Mobility status PT Visit Diagnosis: Difficulty in walking, not elsewhere classified (R26.2);Muscle weakness (generalized) (M62.81)     Time: 3536-1443 PT Time Calculation (min) (ACUTE ONLY): 25 min  Charges:  $Gait Training: 8-22 mins $Therapeutic Activity: 8-22 mins                    G Codes:       Akaash Vandewater B. Migdalia Dk PT, DPT Acute Rehabilitation  [phone] Pager [phone]     [place] 08/21/2017, 4:16 PM

## 2017-08-21 NOTE — Clinical Social Work Note (Signed)
Clinical Social Worker continuing to follow for SNF placement at [facility] once medically stable.  Barbette Or, [place]

## 2017-08-21 NOTE — Progress Notes (Signed)
Subjective:  UOP 1900  - kidney numbers worse however.  He told me today that "I dont want that dialysis "  I asked if it were a choice between HD and dying he said "I would not much care "  Seems he had friend on HD that has passed away and that is what he is basing his decision on   Objective Vital signs in last 24 hours: Vitals:   08/21/17 0700 08/21/17 0758 08/21/17 0800 08/21/17 0830  BP: (!) 105/47  (!) 121/50   Pulse: 82  82 82  Resp: (!) 5  13 13   Temp:  97.6 F (36.4 C)    TempSrc:  Oral    SpO2: 100%  100% 100%  Weight:      Height:       Weight change: -2.3 kg (-5 lb 1.1 oz)  Intake/Output Summary (Last 24 hours) at 08/21/17 0835 Last data filed at 08/21/17 0800  Gross per 24 hour  Intake            731.8 ml  Output             1930 ml  Net          -1198.2 ml    Assessment/ Plan: Pt is a 83 y.o. yo male with CKD who was admitted on 07/31/2017 now  POD 13 from CABG- c/b A on CRF  Assessment/Plan: 1. Renal- dialysis dependant A on CRF - baseline crt 1.5-2.  AKI likely due to ATN after CABG- UOP pretty normal- on CRRT from 9/20-9/24, no RRT for 4 days now. Not uremic.   I am not sure of renal function prognosis- I did not think he could comprehend what this may mean for him, spoke to daughter Shauna Hugh- they of course are hoping that the dialysis is something he will not need long term but would be accepting of that if needed is the impression I got.  Now pt tells me today that he would not want HD under any circumstances ?  Do not need to make final decision today.  BUN and creatinine worse today but rate of rise ? slowing and no absolute need for HD - UOP good so  I hope is a good sign- leave vascath in for now, cont with discussions with family 2. HTN/vol- had been tolerating UF at  2200 per 24 hours while on CRRT -  not too overloaded with CVP 5-7- have added diuretic, making good urine- cont to watch UOP- OK to remove foley 3. Anemia- hgb over 11 but now in the 9's - added  ESA 4. Secondary hyperparathyroidism- phos if anything low- repleted 9/24- is up now- no action 5. Hypo K- repleted earlier,now passed swallowing eval  - replete PRN 6. Hyponatremia- argues for volume excess- cont diuretics - better    Bryndle Corredor A    Labs: Basic Metabolic Panel:  Recent Labs Lab 08/17/17 1610 08/18/17 0355  08/19/17 0332 08/19/17 1608 08/20/17 0430 08/21/17 0347  NA 132* 131*  < > 130* 130* 131* 133*  K 3.2* 3.3*  < > 3.5 4.0 3.7 3.7  CL 100* 97*  < > 96* 98* 98* 98*  CO2 18* 21*  --  20*  --  15* 24  GLUCOSE 143* 117*  < > 103* 101* 91 91  BUN 32* 46*  < > 65* 67* 73* 79*  CREATININE 1.96* 2.73*  < > 3.77* 4.30* 4.43* 4.98*  CALCIUM 8.0* 8.0*  --  8.3*  --  8.7* 8.9  PHOS 6.0* 5.4*  --  5.6*  --   --   --   < > = values in this interval not displayed. Liver Function Tests:  Recent Labs Lab 08/19/17 0332 08/20/17 0430 08/21/17 0347  AST 38 26 23  ALT 36 37 34  ALKPHOS 83 81 80  BILITOT 0.9 0.7 0.8  PROT 5.7* 5.8* 5.7*  ALBUMIN 2.3* 2.2* 2.3*   No results for input(s): LIPASE, AMYLASE in the last 168 hours. No results for input(s): AMMONIA in the last 168 hours. CBC:  Recent Labs Lab 08/17/17 0501  08/18/17 0355  08/19/17 0332 08/19/17 1608 08/20/17 0430 08/21/17 0347  WBC 13.1*  --  11.7*  --  12.0*  --  9.5 6.1  NEUTROABS 10.0*  --   --   --   --   --   --   --   HGB 11.1*  < > 9.5*  < > 9.9* 10.9* 9.6* 9.5*  HCT 33.2*  < > 28.2*  < > 29.4* 32.0* 28.6* 27.6*  MCV 99.4  --  99.3  --  99.3  --  98.3 98.2  PLT 162  --  133*  --  127*  --  112* 114*  < > = values in this interval not displayed. Cardiac Enzymes: No results for input(s): CKTOTAL, CKMB, CKMBINDEX, TROPONINI in the last 168 hours. CBG:  Recent Labs Lab 08/20/17 0744 08/20/17 1208 08/20/17 1622 08/20/17 2156 08/21/17 0752  GLUCAP 106* 122* 89 97 94    Iron Studies: No results for input(s): IRON, TIBC, TRANSFERRIN, FERRITIN in the last 72  hours. Studies/Results: Dg Chest Port 1 View  Result Date: 08/21/2017 CLINICAL DATA:  CABG. EXAM: PORTABLE CHEST 1 VIEW COMPARISON:  08/20/2017 . FINDINGS: Dual lumen left subclavian central line in stable position. Prior CABG. Cardiomegaly with normal pulmonary vascularity. Bibasilar atelectasis. Improved bibasilar infiltrates/edema. Tiny bilateral pleural effusions. No pneumothorax. Surgical staples and clips right chest. IMPRESSION: 1. Dual-lumen left subclavian central line stable position. 2. Prior CABG. Cardiomegaly with normal pulmonary vascularity. Improved bibasilar infiltrates/edema. Findings most consistent improving CHF. Tiny bilateral pleural effusions. 3.  Bibasilar atelectasis . Electronically Signed   By: Marcello Moores  Register   On: 08/21/2017 07:44   Dg Chest Port 1 View  Result Date: 08/20/2017 CLINICAL DATA:  Shortness of breath.  Status post CABG 13 days ago. EXAM: PORTABLE CHEST 1 VIEW COMPARISON:  Portable chest x-ray of August 18, 2017 FINDINGS: The lungs are adequately inflated. There remain increased densities at the right lung base and in the left mid and lower lung. There is small left pleural effusion. The cardiac silhouette is enlarged. The central pulmonary vascularity is mildly prominent but stable. The dual-lumen dialysis catheter is in stable position. The PICC line tip lies in the distal portion of the SVC. There are calcifications in the wall of the thoracic aorta. The visualized sternal wires are intact. IMPRESSION: Persistent left lower lobe and right lower lobe atelectasis or pneumonia. Small left pleural effusion. Mild central pulmonary vascular congestion. Electronically Signed   By: David  Martinique M.D.   On: 08/20/2017 07:55   Medications: Infusions: . sodium chloride Stopped (08/08/17 0900)  . sodium chloride    . sodium chloride 20 mL (08/17/17 2044)  . sodium chloride    . sodium chloride    . heparin 900 Units/hr (08/21/17 0800)  . lactated ringers 10 mL/hr  at 08/10/17 0800    Scheduled Medications: . allopurinol  200 mg Oral  Daily  . amiodarone  200 mg Oral BID  . aspirin EC  325 mg Oral Daily   Or  . aspirin  324 mg Per Tube Daily  . bisacodyl  10 mg Oral Daily   Or  . bisacodyl  10 mg Rectal Daily  . chlorhexidine  15 mL Mouth Rinse BID  . Chlorhexidine Gluconate Cloth  6 each Topical Q0600  . darbepoetin (ARANESP) injection - NON-DIALYSIS  60 mcg Subcutaneous Q Tue-1800  . docusate sodium  200 mg Oral Daily  . feeding supplement (ENSURE ENLIVE)  237 mL Oral BID BM  . finasteride  5 mg Oral Daily  . furosemide  80 mg Intravenous Q12H  . insulin aspart  0-9 Units Subcutaneous TID WC  . mouth rinse  15 mL Mouth Rinse q12n4p  . metoprolol tartrate  12.5 mg Oral BID  . pantoprazole (PROTONIX) IV  40 mg Intravenous Q24H  . rosuvastatin  10 mg Oral q1800  . sodium chloride flush  10-40 mL Intracatheter Q12H  . sodium chloride flush  3 mL Intravenous Q12H  . sorbitol  15 mL Oral Once  . warfarin  2.5 mg Oral q1800  . Warfarin - Physician Dosing Inpatient   Does not apply q1800    have reviewed scheduled and prn medications.  Physical Exam: General: communicate via writing board- no c/o's today  Heart: HR better Lungs: prolonged exp phase Abdomen: soft, non tender Extremities: minimal edema Dialysis Access: vascath placed 9/19    08/21/2017,8:35 AM  LOS: 21 days

## 2017-08-21 NOTE — Progress Notes (Signed)
ANTICOAGULATION CONSULT NOTE - Follow Up Consult  Pharmacy Consult for Heparin  Indication: atrial fibrillation  Allergies  Allergen Reactions  . Lipitor [Atorvastatin] Other (See Comments)    Severe constipation   Patient Measurements: Height: 5\' 7"  (170.2 cm) Weight: 165 lb 2 oz (74.9 kg) IBW/kg (Calculated) : 66.1  Vital Signs: Temp: 97.6 F (36.4 C) (09/28 0758) Temp Source: Oral (09/28 0758) BP: 121/50 (09/28 0800) Pulse Rate: 82 (09/28 0830)  Labs:  Recent Labs  08/19/17 0332  08/19/17 1608  08/20/17 0430 08/20/17 1156 08/20/17 2135 08/21/17 0347  HGB 9.9*  --  10.9*  --  9.6*  --   --  9.5*  HCT 29.4*  --  32.0*  --  28.6*  --   --  27.6*  PLT 127*  --   --   --  112*  --   --  114*  LABPROT  --   --   --   --   --   --   --  18.1*  INR  --   --   --   --   --   --   --  1.51  HEPARINUNFRC 0.26*  < >  --   < >  --  0.62 0.37 0.40  CREATININE 3.77*  --  4.30*  --  4.43*  --   --  4.98*  < > = values in this interval not displayed.  Estimated Creatinine Clearance: 10.7 mL/min (A) (by C-G formula based on SCr of 4.98 mg/dL (H)).   Assessment: 83yo male with history of PAF on IV heparin drip 900 units/hr. Heparin level is 0.40, therapeutic x 2.  CBC stable. No bleeding reported.  Goal of Therapy:  Heparin level 0.3-0.4 units/ml Monitor platelets by anticoagulation protocol: Yes   Plan:  Continue heparin IV 900 units/hr Daily HL, CBC  MD dosing Warfarin  Leroy Libman, PharmD Pharmacy Resident Pager: [phone]

## 2017-08-21 NOTE — Plan of Care (Signed)
Problem: Activity: Goal: Risk for activity intolerance will decrease Outcome: Progressing Pt ambulating in hallway with moderate assistance, ambulated approximately 150-135ft twice. Out of bed to chair daily.

## 2017-08-21 NOTE — Progress Notes (Signed)
PT Cancellation Note  Patient Details Name: Stanley Robles MRN: [id number] DOB: 1932-05-18   Cancelled Treatment:    Reason Eval/Treat Not Completed: Medical issues which prohibited therapy Pt currently in Afib, RN request coming back later to work with him. PT will follow up as able. Thanks.  Adetokunbo Mccadden B. Migdalia Dk PT, DPT Acute Rehabilitation  [phone] Pager [phone] [place] 08/21/2017, 10:08 AM

## 2017-08-22 LAB — CBC
HCT: 29.3 % — ABNORMAL LOW (ref 39.0–52.0)
Hemoglobin: 9.9 g/dL — ABNORMAL LOW (ref 13.0–17.0)
MCH: 33.3 pg (ref 26.0–34.0)
MCHC: 33.8 g/dL (ref 30.0–36.0)
MCV: 98.7 fL (ref 78.0–100.0)
Platelets: 159 10*3/uL (ref 150–400)
RBC: 2.97 MIL/uL — ABNORMAL LOW (ref 4.22–5.81)
RDW: 16.9 % — ABNORMAL HIGH (ref 11.5–15.5)
WBC: 6.6 10*3/uL (ref 4.0–10.5)

## 2017-08-22 LAB — COMPREHENSIVE METABOLIC PANEL
ALT: 31 U/L (ref 17–63)
AST: 24 U/L (ref 15–41)
Albumin: 2.4 g/dL — ABNORMAL LOW (ref 3.5–5.0)
Alkaline Phosphatase: 77 U/L (ref 38–126)
Anion gap: 16 — ABNORMAL HIGH (ref 5–15)
BUN: 83 mg/dL — ABNORMAL HIGH (ref 6–20)
CO2: 20 mmol/L — ABNORMAL LOW (ref 22–32)
Calcium: 9.1 mg/dL (ref 8.9–10.3)
Chloride: 100 mmol/L — ABNORMAL LOW (ref 101–111)
Creatinine, Ser: 5.22 mg/dL — ABNORMAL HIGH (ref 0.61–1.24)
GFR calc Af Amer: 11 mL/min — ABNORMAL LOW (ref >60)
GFR calc non Af Amer: 9 mL/min — ABNORMAL LOW (ref >60)
Glucose, Bld: 93 mg/dL (ref 65–99)
Potassium: 3.7 mmol/L (ref 3.5–5.1)
Sodium: 136 mmol/L (ref 135–145)
Total Bilirubin: 0.7 mg/dL (ref 0.3–1.2)
Total Protein: 5.9 g/dL — ABNORMAL LOW (ref 6.5–8.1)

## 2017-08-22 LAB — GLUCOSE, CAPILLARY
GLUCOSE-CAPILLARY: 148 mg/dL — AB (ref 65–99)
GLUCOSE-CAPILLARY: 113 mg/dL — AB (ref 65–99)
Glucose-Capillary: 117 mg/dL — ABNORMAL HIGH (ref 65–99)
Glucose-Capillary: 100 mg/dL — ABNORMAL HIGH (ref 65–99)

## 2017-08-22 LAB — PROTIME-INR
INR: 2.04
INR: 1.94
Prothrombin Time: 22.9 seconds — ABNORMAL HIGH (ref 11.4–15.2)
Prothrombin Time: 22 seconds — ABNORMAL HIGH (ref 11.4–15.2)

## 2017-08-22 LAB — HEPARIN LEVEL (UNFRACTIONATED): Heparin Unfractionated: 0.44 IU/mL (ref 0.30–0.70)

## 2017-08-22 MED ORDER — WARFARIN SODIUM 2 MG PO TABS
2.0000 mg | ORAL_TABLET | Freq: Every day | ORAL | Status: AC
Start: 2017-08-22 — End: 2017-08-22
  Administered 2017-08-22: 2 mg via ORAL
  Filled 2017-08-22: qty 1

## 2017-08-22 MED ORDER — ASPIRIN 81 MG PO CHEW
81.0000 mg | CHEWABLE_TABLET | Freq: Every day | ORAL | Status: DC
  Administered 2017-08-23: 81 mg
  Filled 2017-08-22: qty 1

## 2017-08-22 MED ORDER — FUROSEMIDE 10 MG/ML IJ SOLN
40.0000 mg | Freq: Two times a day (BID) | INTRAMUSCULAR | Status: DC
  Administered 2017-08-22 – 2017-08-23 (×3): 40 mg via INTRAVENOUS
  Filled 2017-08-22 (×3): qty 4

## 2017-08-22 MED ORDER — ASPIRIN EC 81 MG PO TBEC
81.0000 mg | DELAYED_RELEASE_TABLET | Freq: Every day | ORAL | Status: DC
  Administered 2017-08-22 – 2017-08-28 (×6): 81 mg via ORAL
  Filled 2017-08-22 (×6): qty 1

## 2017-08-22 NOTE — Progress Notes (Signed)
Patient ID: Stanley Robles, male   DOB: July 03, 1932, 83 y.o.   MRN: [id number] EVENING ROUNDS NOTE :     [address]       [place] [address]             [phone]                 15 Days Post-Op Procedure(s) (LRB): CORONARY ARTERY BYPASS GRAFTING (CABG) x 2 WITH ENDOSCOPIC HARVESTING OF RIGHT SAPHENOUS VEIN. (N/A) TRANSESOPHAGEAL ECHOCARDIOGRAM (TEE) (N/A)  Total Length of Stay:  LOS: 22 days  BP (!) 111/54   Pulse 85   Temp (!) 96.5 F (35.8 C) (Axillary)   Resp 12   Ht 5\' 7"  (1.702 m)   Wt 164 lb 7.4 oz (74.6 kg)   SpO2 100%   BMI 25.76 kg/m   .Intake/Output      09/29 0701 - 09/30 0700   P.O. 240   I.V. (mL/kg) 40 (0.5)   Total Intake(mL/kg) 280 (3.8)   Urine (mL/kg/hr) 325 (0.3)   Total Output 325   Net -45       Urine Occurrence 1 x   Stool Occurrence 2 x     . sodium chloride Stopped (08/08/17 0900)  . sodium chloride    . sodium chloride 20 mL (08/17/17 2044)  . sodium chloride    . sodium chloride    . lactated ringers 10 mL/hr at 08/10/17 0800     Lab Results  Component Value Date   WBC 6.6 08/22/2017   HGB 9.9 (L) 08/22/2017   HCT 29.3 (L) 08/22/2017   PLT 159 08/22/2017   GLUCOSE 93 08/22/2017   CHOL 162 02/28/2015   TRIG 104 08/17/2017   HDL 31 (L) 02/28/2015   LDLCALC 102 (H) 02/28/2015   ALT 31 08/22/2017   AST 24 08/22/2017   NA 136 08/22/2017   K 3.7 08/22/2017   CL 100 (L) 08/22/2017   CREATININE 5.22 (H) 08/22/2017   BUN 83 (H) 08/22/2017   CO2 20 (L) 08/22/2017   TSH 3.035 08/04/2017   INR 1.94 08/22/2017   HGBA1C 5.3 08/04/2017   Stable day  Grace Isaac MD  [facility] [phone] Office [phone] 08/22/2017 7:45 PM

## 2017-08-22 NOTE — Progress Notes (Signed)
Subjective:  UOP 1300  - kidney numbers maybe stabilizing.     Objective Vital signs in last 24 hours: Vitals:   08/22/17 0500 08/22/17 0600 08/22/17 0700 08/22/17 0741  BP: (!) 88/51 (!) 110/47 (!) 119/50   Pulse: (!) 104 (!) 108 94   Resp: 13 13 18    Temp:    97.7 F (36.5 C)  TempSrc:    Oral  SpO2: 100% 100% 100%   Weight:   74.6 kg (164 lb 7.4 oz)   Height:   5\' 7"  (1.702 m)    Weight change: -0.3 kg (-10.6 oz)  Intake/Output Summary (Last 24 hours) at 08/22/17 0758 Last data filed at 08/22/17 0700  Gross per 24 hour  Intake            635.9 ml  Output             1280 ml  Net           -644.1 ml    Assessment/ Plan: Pt is a 83 y.o. yo male with CKD who was admitted on 07/31/2017 now  POD 13 from CABG- c/b A on CRF  Assessment/Plan: 1. Renal- previous dialysis dependant A on CRF - baseline crt 1.5-2.  AKI likely due to ATN after CABG-  on CRRT from 9/20-9/24, no RRT for 5 days now. Not overly uremic.   I am not sure of renal function prognosis- I did not think he could comprehend what this may mean for him, spoke to daughter Shauna Hugh- they of course are hoping that the dialysis is something he will not need long term but would be accepting of that if needed is the impression I got.  Now pt tells me today that he would not want HD under any circumstances ?  Do not need to make final decision today.  BUN and creatinine maybe stabilizing  and no absolute need for HD - UOP good so  I hope is a good sign- leave vascath in for now, cont with discussions with family- vascath now 39 days old 2. HTN/vol- had been tolerating UF at  2200 per 24 hours while on CRRT -  not too overloaded with CVP 5-7- have added diuretic, making good urine- cont to watch UOP- I think close to EDW- will dec lasix 3. Anemia- hgb over 11 but now in the 9's - added ESA 4. Secondary hyperparathyroidism- phos if anything low- repleted 9/24- last was 5.6- no action 5. Hypo K- repleted earlier,now passed swallowing eval   - replete PRN 6. Hyponatremia- argues for volume excess- cont diuretics - better    Buford Gayler A    Labs: Basic Metabolic Panel:  Recent Labs Lab 08/17/17 1610 08/18/17 0355  08/19/17 0332  08/20/17 0430 08/21/17 0347 08/21/17 1644 08/22/17 0612  NA 132* 131*  < > 130*  < > 131* 133* 134* 136  K 3.2* 3.3*  < > 3.5  < > 3.7 3.7 3.9 3.7  CL 100* 97*  < > 96*  < > 98* 98* 98* 100*  CO2 18* 21*  --  20*  --  15* 24  --  20*  GLUCOSE 143* 117*  < > 103*  < > 91 91 109* 93  BUN 32* 46*  < > 65*  < > 73* 79* 86* 83*  CREATININE 1.96* 2.73*  < > 3.77*  < > 4.43* 4.98* 5.10* 5.22*  CALCIUM 8.0* 8.0*  --  8.3*  --  8.7* 8.9  --  9.1  PHOS 6.0* 5.4*  --  5.6*  --   --   --   --   --   < > = values in this interval not displayed. Liver Function Tests:  Recent Labs Lab 08/20/17 0430 08/21/17 0347 08/22/17 0612  AST 26 23 24   ALT 37 34 31  ALKPHOS 81 80 77  BILITOT 0.7 0.8 0.7  PROT 5.8* 5.7* 5.9*  ALBUMIN 2.2* 2.3* 2.4*   No results for input(s): LIPASE, AMYLASE in the last 168 hours. No results for input(s): AMMONIA in the last 168 hours. CBC:  Recent Labs Lab 08/17/17 0501  08/18/17 0355  08/19/17 0332  08/20/17 0430 08/21/17 0347 08/21/17 1644 08/22/17 0612  WBC 13.1*  --  11.7*  --  12.0*  --  9.5 6.1  --  2.8*  NEUTROABS 10.0*  --   --   --   --   --   --   --   --   --   HGB 11.1*  < > 9.5*  < > 9.9*  < > 9.6* 9.5* 9.5* 16.9  HCT 33.2*  < > 28.2*  < > 29.4*  < > 28.6* 27.6* 28.0* 48.6  MCV 99.4  --  99.3  --  99.3  --  98.3 98.2  --  98.2  PLT 162  --  133*  --  127*  --  112* 114*  --  80*  < > = values in this interval not displayed. Cardiac Enzymes: No results for input(s): CKTOTAL, CKMB, CKMBINDEX, TROPONINI in the last 168 hours. CBG:  Recent Labs Lab 08/20/17 2156 08/21/17 0752 08/21/17 1209 08/21/17 1549 08/21/17 2126  GLUCAP 97 94 111* 132* 109*    Iron Studies: No results for input(s): IRON, TIBC, TRANSFERRIN, FERRITIN in the  last 72 hours. Studies/Results: Dg Chest Port 1 View  Result Date: 08/21/2017 CLINICAL DATA:  CABG. EXAM: PORTABLE CHEST 1 VIEW COMPARISON:  08/20/2017 . FINDINGS: Dual lumen left subclavian central line in stable position. Prior CABG. Cardiomegaly with normal pulmonary vascularity. Bibasilar atelectasis. Improved bibasilar infiltrates/edema. Tiny bilateral pleural effusions. No pneumothorax. Surgical staples and clips right chest. IMPRESSION: 1. Dual-lumen left subclavian central line stable position. 2. Prior CABG. Cardiomegaly with normal pulmonary vascularity. Improved bibasilar infiltrates/edema. Findings most consistent improving CHF. Tiny bilateral pleural effusions. 3.  Bibasilar atelectasis . Electronically Signed   By: Marcello Moores  Register   On: 08/21/2017 07:44   Medications: Infusions: . sodium chloride Stopped (08/08/17 0900)  . sodium chloride    . sodium chloride 20 mL (08/17/17 2044)  . sodium chloride    . sodium chloride    . heparin 900 Units/hr (08/22/17 0700)  . lactated ringers 10 mL/hr at 08/10/17 0800    Scheduled Medications: . allopurinol  200 mg Oral Daily  . amiodarone  200 mg Oral BID  . aspirin EC  325 mg Oral Daily   Or  . aspirin  324 mg Per Tube Daily  . bisacodyl  10 mg Oral Daily   Or  . bisacodyl  10 mg Rectal Daily  . chlorhexidine  15 mL Mouth Rinse BID  . Chlorhexidine Gluconate Cloth  6 each Topical Q0600  . darbepoetin (ARANESP) injection - NON-DIALYSIS  60 mcg Subcutaneous Q Tue-1800  . docusate sodium  200 mg Oral Daily  . feeding supplement (ENSURE ENLIVE)  237 mL Oral BID BM  . finasteride  5 mg Oral Daily  . furosemide  80 mg Intravenous Q12H  .  insulin aspart  0-9 Units Subcutaneous TID WC  . mouth rinse  15 mL Mouth Rinse q12n4p  . metoprolol tartrate  12.5 mg Oral BID  . pantoprazole (PROTONIX) IV  40 mg Intravenous Q24H  . rosuvastatin  10 mg Oral q1800  . sodium chloride flush  10-40 mL Intracatheter Q12H  . sodium chloride flush   3 mL Intravenous Q12H  . sorbitol  15 mL Oral Once  . Warfarin - Physician Dosing Inpatient   Does not apply q1800    have reviewed scheduled and prn medications.  Physical Exam: General: communicate via writing board- no c/o's today  Heart: HR better Lungs: prolonged exp phase Abdomen: soft, non tender Extremities: minimal edema Dialysis Access: vascath placed 9/19    08/22/2017,7:58 AM  LOS: 22 days

## 2017-08-22 NOTE — Progress Notes (Signed)
Subjective:   Walked in the hall twice today.  Still in NSR.   Objective:  Vital Signs in the last 24 hours: BP (!) 132/46   Pulse 88   Temp (!) 96.5 F (35.8 C) (Axillary)   Resp 19   Ht 5\' 7"  (1.702 m)   Wt 74.6 kg (164 lb 7.4 oz)   SpO2 100%   BMI 25.76 kg/m   Physical Exam: Elderly WM in NAD, HOH Lungs:  Reduced BS Cardiac:  regular rhythm, normal S1 and S2, no S3 Extremities:  No edema present  Intake/Output from previous day: 09/28 0701 - 09/29 0700 In: 635.9 [P.O.:170; I.V.:465.9] Out: 1280 [Urine:1280] Weight Filed Weights   08/20/17 0600 08/21/17 0600 08/22/17 0700  Weight: 77.2 kg (170 lb 3.1 oz) 74.9 kg (165 lb 2 oz) 74.6 kg (164 lb 7.4 oz)    Lab Results: Basic Metabolic Panel:  Recent Labs  08/21/17 0347 08/21/17 1644 08/22/17 0612  NA 133* 134* 136  K 3.7 3.9 3.7  CL 98* 98* 100*  CO2 24  --  20*  GLUCOSE 91 109* 93  BUN 79* 86* 83*  CREATININE 4.98* 5.10* 5.22*    CBC:  Recent Labs  08/22/17 0612 08/22/17 0745  WBC 2.8* 6.6  HGB 16.9 9.9*  HCT 48.6 29.3*  MCV 98.2 98.7  PLT 80* 159    BNP    Component Value Date/Time   BNP 560.8 (H) 08/01/2017 0057    Telemetry: Currently sinus with rate 72  Assessment/Plan:  1. Recent CABG 2. ARF on top of chronic but  UOP now increasing, Cr. Still going up 3. Prior stroke and dysphagia 4. Atrial fibrillation currently in sinus rhythm    Spoke with family.  Renal function still tenuous but making urine some. I told them that he is still within the window that we could see some recovery of renal function and that might need additional dialyss to bridge the gap.  I don't think he is interested in long term dialysis.     Kerry Hough  MD [facility]  08/22/2017, 6:33 PM

## 2017-08-22 NOTE — Progress Notes (Addendum)
Patient ID: Stanley Robles, male   DOB: [birth year], 83 y.o.   MRN: [id number] TCTS DAILY ICU PROGRESS NOTE                   [address]            [place] [address]          [phone]   15 Days Post-Op Procedure(s) (LRB): CORONARY ARTERY BYPASS GRAFTING (CABG) x 2 WITH ENDOSCOPIC HARVESTING OF RIGHT SAPHENOUS VEIN. (N/A) TRANSESOPHAGEAL ECHOCARDIOGRAM (TEE) (N/A)  Total Length of Stay:  LOS: 22 days   Subjective: Hard of hearing , neuro intact  Objective: Vital signs in last 24 hours: Temp:  [96.3 F (35.7 C)-98.4 F (36.9 C)] 97.7 F (36.5 C) (09/29 0741) Pulse Rate:  [82-118] 94 (09/29 0700) Cardiac Rhythm: Atrial fibrillation (09/29 0400) Resp:  [9-26] 18 (09/29 0700) BP: (80-126)/(40-65) 119/50 (09/29 0700) SpO2:  [98 %-100 %] 100 % (09/29 0700) Weight:  [164 lb 7.4 oz (74.6 kg)] 164 lb 7.4 oz (74.6 kg) (09/29 0700)  Filed Weights   08/20/17 0600 08/21/17 0600 08/22/17 0700  Weight: 170 lb 3.1 oz (77.2 kg) 165 lb 2 oz (74.9 kg) 164 lb 7.4 oz (74.6 kg)    Weight change: -10.6 oz (-0.3 kg)   Hemodynamic parameters for last 24 hours:    Intake/Output from previous day: 09/28 0701 - 09/29 0700 In: 635.9 [P.O.:170; I.V.:465.9] Out: 1280 [Urine:1280]  Intake/Output this shift: No intake/output data recorded.  Current Meds: Scheduled Meds: . allopurinol  200 mg Oral Daily  . amiodarone  200 mg Oral BID  . aspirin EC  325 mg Oral Daily   Or  . aspirin  324 mg Per Tube Daily  . bisacodyl  10 mg Oral Daily   Or  . bisacodyl  10 mg Rectal Daily  . chlorhexidine  15 mL Mouth Rinse BID  . Chlorhexidine Gluconate Cloth  6 each Topical Q0600  . darbepoetin (ARANESP) injection - NON-DIALYSIS  60 mcg Subcutaneous Q Tue-1800  . docusate sodium  200 mg Oral Daily  . feeding supplement (ENSURE ENLIVE)  237 mL Oral BID BM  . finasteride  5 mg Oral Daily  . furosemide  80 mg Intravenous Q12H  . insulin aspart  0-9 Units Subcutaneous TID WC  . mouth  rinse  15 mL Mouth Rinse q12n4p  . metoprolol tartrate  12.5 mg Oral BID  . pantoprazole (PROTONIX) IV  40 mg Intravenous Q24H  . rosuvastatin  10 mg Oral q1800  . sodium chloride flush  10-40 mL Intracatheter Q12H  . sodium chloride flush  3 mL Intravenous Q12H  . sorbitol  15 mL Oral Once  . Warfarin - Physician Dosing Inpatient   Does not apply q1800   Continuous Infusions: . sodium chloride Stopped (08/08/17 0900)  . sodium chloride    . sodium chloride 20 mL (08/17/17 2044)  . sodium chloride    . sodium chloride    . heparin 900 Units/hr (08/22/17 0700)  . lactated ringers 10 mL/hr at 08/10/17 0800   PRN Meds:.sodium chloride, Place/Maintain arterial line **AND** sodium chloride, Place/Maintain arterial line **AND** sodium chloride, heparin, HYDROcodone-acetaminophen, levalbuterol, metoprolol tartrate, ondansetron (ZOFRAN) IV, RESOURCE THICKENUP CLEAR, sodium chloride flush, sodium chloride flush, white petrolatum  General appearance: alert, cooperative, appears older than stated age and no distress Neurologic: intact Heart: regular rate and rhythm, S1, S2 normal, no murmur, click, rub or gallop Lungs: diminished breath sounds bibasilar Abdomen: soft, non-tender;  bowel sounds normal; no masses,  no organomegaly Extremities: extremities normal, atraumatic, no cyanosis or edema and Homans sign is negative, no sign of DVT Wound: intact  Lab Results: CBC: Recent Labs  08/21/17 0347 08/21/17 1644 08/22/17 0612  WBC 6.1  --  2.8*  HGB 9.5* 9.5* 16.9  HCT 27.6* 28.0* 48.6  PLT 114*  --  80*   BMET:  Recent Labs  08/21/17 0347 08/21/17 1644 08/22/17 0612  NA 133* 134* 136  K 3.7 3.9 3.7  CL 98* 98* 100*  CO2 24  --  20*  GLUCOSE 91 109* 93  BUN 79* 86* 83*  CREATININE 4.98* 5.10* 5.22*  CALCIUM 8.9  --  9.1    CMET: Lab Results  Component Value Date   WBC 2.8 (L) 08/22/2017   HGB 16.9 08/22/2017   HCT 48.6 08/22/2017   PLT 80 (L) 08/22/2017   GLUCOSE 93  08/22/2017   CHOL 162 02/28/2015   TRIG 104 08/17/2017   HDL 31 (L) 02/28/2015   LDLCALC 102 (H) 02/28/2015   ALT 31 08/22/2017   AST 24 08/22/2017   NA 136 08/22/2017   K 3.7 08/22/2017   CL 100 (L) 08/22/2017   CREATININE 5.22 (H) 08/22/2017   BUN 83 (H) 08/22/2017   CO2 20 (L) 08/22/2017   TSH 3.035 08/04/2017   INR 2.04 08/22/2017   HGBA1C 5.3 08/04/2017      PT/INR:  Recent Labs  08/22/17 0612  LABPROT 22.9*  INR 2.04   Radiology: No results found.   Assessment/Plan: S/P Procedure(s) (LRB): CORONARY ARTERY BYPASS GRAFTING (CABG) x 2 WITH ENDOSCOPIC HARVESTING OF RIGHT SAPHENOUS VEIN. (N/A) TRANSESOPHAGEAL ECHOCARDIOGRAM (TEE) (N/A) Cr remains -> 5.0 - 1280 uop 24 hours  Slow to progress INR 22.9 /2.04 on coumadin , asa cut to 81 mg daily , d/c heparin today , plts dropping since on heparin but lab suspect with large rise in H/H repeated   Lasix 80 mg iv bid  Renal following -   Grace Isaac 08/22/2017 8:06 AM

## 2017-08-23 LAB — CBC
HCT: 48.6 % (ref 39.0–52.0)
HCT: 27.6 % — ABNORMAL LOW (ref 39.0–52.0)
HEMATOCRIT: 28.4 % — AB (ref 39.0–52.0)
HEMOGLOBIN: 9.4 g/dL — AB (ref 13.0–17.0)
Hemoglobin: 16.9 g/dL (ref 13.0–17.0)
Hemoglobin: 9.3 g/dL — ABNORMAL LOW (ref 13.0–17.0)
MCH: 34.1 pg — ABNORMAL HIGH (ref 26.0–34.0)
MCH: 33.5 pg (ref 26.0–34.0)
MCH: 33.1 pg (ref 26.0–34.0)
MCHC: 34.8 g/dL (ref 30.0–36.0)
MCHC: 33.7 g/dL (ref 30.0–36.0)
MCHC: 33.1 g/dL (ref 30.0–36.0)
MCV: 98.2 fL (ref 78.0–100.0)
MCV: 99.3 fL (ref 78.0–100.0)
MCV: 100 fL (ref 78.0–100.0)
Platelets: 80 10*3/uL — ABNORMAL LOW (ref 150–400)
Platelets: 129 10*3/uL — ABNORMAL LOW (ref 150–400)
Platelets: 152 10*3/uL (ref 150–400)
RBC: 4.95 MIL/uL (ref 4.22–5.81)
RBC: 2.78 MIL/uL — ABNORMAL LOW (ref 4.22–5.81)
RBC: 2.84 MIL/uL — AB (ref 4.22–5.81)
RDW: 16.9 % — ABNORMAL HIGH (ref 11.5–15.5)
RDW: 16.8 % — ABNORMAL HIGH (ref 11.5–15.5)
RDW: 16.8 % — ABNORMAL HIGH (ref 11.5–15.5)
WBC: 2.8 10*3/uL — ABNORMAL LOW (ref 4.0–10.5)
WBC: 5.8 10*3/uL (ref 4.0–10.5)
WBC: 7.1 10*3/uL (ref 4.0–10.5)

## 2017-08-23 LAB — COMPREHENSIVE METABOLIC PANEL
ALT: 28 U/L (ref 17–63)
AST: 20 U/L (ref 15–41)
Albumin: 2.4 g/dL — ABNORMAL LOW (ref 3.5–5.0)
Alkaline Phosphatase: 72 U/L (ref 38–126)
Anion gap: 12 (ref 5–15)
BUN: 85 mg/dL — ABNORMAL HIGH (ref 6–20)
CO2: 22 mmol/L (ref 22–32)
Calcium: 9.1 mg/dL (ref 8.9–10.3)
Chloride: 104 mmol/L (ref 101–111)
Creatinine, Ser: 5.43 mg/dL — ABNORMAL HIGH (ref 0.61–1.24)
GFR calc Af Amer: 10 mL/min — ABNORMAL LOW (ref >60)
GFR calc non Af Amer: 9 mL/min — ABNORMAL LOW (ref >60)
Glucose, Bld: 105 mg/dL — ABNORMAL HIGH (ref 65–99)
Potassium: 3.6 mmol/L (ref 3.5–5.1)
Sodium: 138 mmol/L (ref 135–145)
Total Bilirubin: 0.8 mg/dL (ref 0.3–1.2)
Total Protein: 6 g/dL — ABNORMAL LOW (ref 6.5–8.1)

## 2017-08-23 LAB — GLUCOSE, CAPILLARY: GLUCOSE-CAPILLARY: 121 mg/dL — AB (ref 65–99)

## 2017-08-23 LAB — PROTIME-INR
INR: 2.42
Prothrombin Time: 26.2 seconds — ABNORMAL HIGH (ref 11.4–15.2)

## 2017-08-23 MED ORDER — FUROSEMIDE 40 MG PO TABS
40.0000 mg | ORAL_TABLET | Freq: Two times a day (BID) | ORAL | Status: DC
  Administered 2017-08-23 – 2017-08-26 (×6): 40 mg via ORAL
  Filled 2017-08-23 (×6): qty 1

## 2017-08-23 NOTE — Progress Notes (Signed)
Right chest staples and trialysis catheter removed per MD order. No complications.

## 2017-08-23 NOTE — Progress Notes (Addendum)
TCTS DAILY ICU PROGRESS NOTE                   [address]            [place] [address]          [phone]   16 Days Post-Op Procedure(s) (LRB): CORONARY ARTERY BYPASS GRAFTING (CABG) x 2 WITH ENDOSCOPIC HARVESTING OF RIGHT SAPHENOUS VEIN. (N/A) TRANSESOPHAGEAL ECHOCARDIOGRAM (TEE) (N/A)  Total Length of Stay:  LOS: 23 days   Subjective:  Patient ambulated entire unit this morning.  Has no decided he is unwilling to undergo dialysis.   Objective: Vital signs in last 24 hours: Temp:  [96.5 F (35.8 C)-97.8 F (36.6 C)] 96.5 F (35.8 C) (09/30 0804) Pulse Rate:  [81-97] 92 (09/30 0700) Cardiac Rhythm: Normal sinus rhythm (09/30 0400) Resp:  [4-22] 13 (09/30 0700) BP: (100-148)/(40-56) 135/40 (09/30 0700) SpO2:  [98 %-100 %] 100 % (09/30 0700) Weight:  [163 lb 12.8 oz (74.3 kg)] 163 lb 12.8 oz (74.3 kg) (09/30 0500)  Filed Weights   08/21/17 0600 08/22/17 0700 08/23/17 0500  Weight: 165 lb 2 oz (74.9 kg) 164 lb 7.4 oz (74.6 kg) 163 lb 12.8 oz (74.3 kg)    Weight change: -10.6 oz (-0.3 kg)   Intake/Output from previous day: 09/29 0701 - 09/30 0700 In: 520 [P.O.:480; I.V.:40] Out: 1025 [Urine:1025]  Current Meds: Scheduled Meds: . allopurinol  200 mg Oral Daily  . amiodarone  200 mg Oral BID  . aspirin EC  81 mg Oral Daily   Or  . aspirin  81 mg Per Tube Daily  . bisacodyl  10 mg Oral Daily   Or  . bisacodyl  10 mg Rectal Daily  . Chlorhexidine Gluconate Cloth  6 each Topical Q0600  . darbepoetin (ARANESP) injection - NON-DIALYSIS  60 mcg Subcutaneous Q Tue-1800  . docusate sodium  200 mg Oral Daily  . feeding supplement (ENSURE ENLIVE)  237 mL Oral BID BM  . finasteride  5 mg Oral Daily  . furosemide  40 mg Oral BID  . insulin aspart  0-9 Units Subcutaneous TID WC  . metoprolol tartrate  12.5 mg Oral BID  . pantoprazole (PROTONIX) IV  40 mg Intravenous Q24H  . rosuvastatin  10 mg Oral q1800  . sodium chloride flush  10-40 mL  Intracatheter Q12H  . sodium chloride flush  3 mL Intravenous Q12H  . sorbitol  15 mL Oral Once  . Warfarin - Physician Dosing Inpatient   Does not apply q1800   Continuous Infusions: . sodium chloride Stopped (08/08/17 0900)  . sodium chloride    . sodium chloride 20 mL (08/17/17 2044)  . sodium chloride    . sodium chloride    . lactated ringers 10 mL/hr at 08/10/17 0800   PRN Meds:.sodium chloride, Place/Maintain arterial line **AND** sodium chloride, Place/Maintain arterial line **AND** sodium chloride, HYDROcodone-acetaminophen, levalbuterol, metoprolol tartrate, ondansetron (ZOFRAN) IV, RESOURCE THICKENUP CLEAR, sodium chloride flush, sodium chloride flush, white petrolatum  General appearance: alert, cooperative and no distress Heart: regular rate and rhythm Lungs: clear to auscultation bilaterally Abdomen: soft, non-tender; bowel sounds normal; no masses,  no organomegaly Extremities: extremities normal, atraumatic, no cyanosis or edema Wound: clean and dry, staples remain in place along axillary incision  Lab Results: CBC: Recent Labs  08/22/17 0745 08/23/17 0343  WBC 6.6 5.8  HGB 9.9* 9.3*  HCT 29.3* 27.6*  PLT 159 129*   BMET:  Recent Labs  08/22/17  0612 08/23/17 0343  NA 136 138  K 3.7 3.6  CL 100* 104  CO2 20* 22  GLUCOSE 93 105*  BUN 83* 85*  CREATININE 5.22* 5.43*  CALCIUM 9.1 9.1    CMET: Lab Results  Component Value Date   WBC 5.8 08/23/2017   HGB 9.3 (L) 08/23/2017   HCT 27.6 (L) 08/23/2017   PLT 129 (L) 08/23/2017   GLUCOSE 105 (H) 08/23/2017   CHOL 162 02/28/2015   TRIG 104 08/17/2017   HDL 31 (L) 02/28/2015   LDLCALC 102 (H) 02/28/2015   ALT 28 08/23/2017   AST 20 08/23/2017   NA 138 08/23/2017   K 3.6 08/23/2017   CL 104 08/23/2017   CREATININE 5.43 (H) 08/23/2017   BUN 85 (H) 08/23/2017   CO2 22 08/23/2017   TSH 3.035 08/04/2017   INR 2.42 08/23/2017   HGBA1C 5.3 08/04/2017      PT/INR:  Recent Labs  08/23/17 0343    LABPROT 26.2*  INR 2.42   Radiology: No results found.   Assessment/Plan: S/P Procedure(s) (LRB): CORONARY ARTERY BYPASS GRAFTING (CABG) x 2 WITH ENDOSCOPIC HARVESTING OF RIGHT SAPHENOUS VEIN. (N/A) TRANSESOPHAGEAL ECHOCARDIOGRAM (TEE) (N/A)  1. CV- remains hemodynamically stable on Amiodarone and Lopressor.. EPW remain in place, will discuss removal with Dr. Roxy Horseman as patient is no coumadin 2. INR 2.4, off heparin, received 2 mg of coumadin last night. 3. Pulm-  No acute issues, continue IS 4. Renal- AKI likely due to ATN... Creatinine at 5.43 , U/O is increasing, continue Lasix, Nephrology following 5. Thrombocytopenia- Plt count at 129, off heparin will follow 6. CBGs controlled- patient not a diabetic, sugars have been controlled, will d/c SSIP and CBGs 7. Dispo- patient stable, urinary output is increasing, creatinine remains elevated... Nephrology following, will remove chest tube sutures and staples today, will discuss possible wire removal with Dr. Servando Snare, watch platelets, continue current care     Ellwood Handler 08/23/2017 9:28 AM   Lab Results  Component Value Date   INR 2.42 08/23/2017   INR 1.94 08/22/2017   INR 2.04 08/22/2017     chesck inr tomorrow and decided about wire removal I have seen and examined Thompson Grayer and agree with the above assessment  and plan.  Grace Isaac MD [facility] [phone] Office [phone] 08/23/2017 5:36 PM

## 2017-08-23 NOTE — Progress Notes (Signed)
Subjective:  UOP 1000  - kidney numbers maybe stabilizing ?  Not uremic- walked the whole loop of the floor     Objective Vital signs in last 24 hours: Vitals:   08/23/17 0400 08/23/17 0500 08/23/17 0600 08/23/17 0700  BP: (!) 128/43 (!) 148/52 (!) 114/42 (!) 135/40  Pulse: 84 86 97 92  Resp: 11 18 11 13   Temp:      TempSrc:      SpO2: 100% 100% 100% 100%  Weight:  74.3 kg (163 lb 12.8 oz)    Height:       Weight change: -0.3 kg (-10.6 oz)  Intake/Output Summary (Last 24 hours) at 08/23/17 0806 Last data filed at 08/23/17 0600  Gross per 24 hour  Intake              520 ml  Output             1025 ml  Net             -505 ml    Assessment/ Plan: Pt is a 83 y.o. yo male with CKD who was admitted on 07/31/2017 now  POD 14 from CABG- c/b A on CRF  Assessment/Plan: 1. Renal- previous dialysis dependant A on CRF - baseline crt 1.5-2.  AKI likely due to ATN after CABG-  on CRRT from 9/20-9/24, no RRT for 6 days now. Not overly uremic but numbers not great and have not yet trended down but he seems to be moving forward in his recovery.   I am not sure of renal function prognosis- I  spoke to daughter Stanley Robles- they of course were hoping that the dialysis is not something he would need long term but per her would be accepting if needed.  Now pt tells me this week that he would not want HD under any circumstances - this was confirmed by daughter Stanley Robles here today. BUN and creatinine hopefully stabilizing, not obviously uremic  and no absolute need for HD-  - UOP good so  I hope is a good sign- vascath now 22 days old so will take it out 2. HTN/vol- had been tolerating UF at  2200 per 24 hours while on CRRT -  not too overloaded with CVP 5-7- have added diuretic, making good urine- cont to watch UOP- I think close to EDW- have dec lasix to 40 IV BID- will change to PO today  3. Anemia- hgb over 11 but now in the 9's - added ESA- will check iron stores  4. Secondary hyperparathyroidism- phos if anything  low- repleted 9/24- last was 5.6- no action- check tomorrow  5. Hypo K- repleted earlier,now passed swallowing eval  - replete PRN 6. Hyponatremia- argued for volume excess- cont diuretics - much  better    Stanley Robles A    Labs: Basic Metabolic Panel:  Recent Labs Lab 08/17/17 1610 08/18/17 0355  08/19/17 0332  08/21/17 0347 08/21/17 1644 08/22/17 0612 08/23/17 0343  NA 132* 131*  < > 130*  < > 133* 134* 136 138  K 3.2* 3.3*  < > 3.5  < > 3.7 3.9 3.7 3.6  CL 100* 97*  < > 96*  < > 98* 98* 100* 104  CO2 18* 21*  --  20*  < > 24  --  20* 22  GLUCOSE 143* 117*  < > 103*  < > 91 109* 93 105*  BUN 32* 46*  < > 65*  < > 79* 86* 83* 85*  CREATININE 1.96* 2.73*  < > 3.77*  < > 4.98* 5.10* 5.22* 5.43*  CALCIUM 8.0* 8.0*  --  8.3*  < > 8.9  --  9.1 9.1  PHOS 6.0* 5.4*  --  5.6*  --   --   --   --   --   < > = values in this interval not displayed. Liver Function Tests:  Recent Labs Lab 08/21/17 0347 08/22/17 0612 08/23/17 0343  AST 23 24 20   ALT 34 31 28  ALKPHOS 80 77 72  BILITOT 0.8 0.7 0.8  PROT 5.7* 5.9* 6.0*  ALBUMIN 2.3* 2.4* 2.4*   No results for input(s): LIPASE, AMYLASE in the last 168 hours. No results for input(s): AMMONIA in the last 168 hours. CBC:  Recent Labs Lab 08/17/17 0501  08/20/17 0430 08/21/17 0347  08/22/17 0612 08/22/17 0745 08/23/17 0343  WBC 13.1*  < > 9.5 6.1  --  2.8* 6.6 5.8  NEUTROABS 10.0*  --   --   --   --   --   --   --   HGB 11.1*  < > 9.6* 9.5*  < > 16.9 9.9* 9.3*  HCT 33.2*  < > 28.6* 27.6*  < > 48.6 29.3* 27.6*  MCV 99.4  < > 98.3 98.2  --  98.2 98.7 99.3  PLT 162  < > 112* 114*  --  80* 159 129*  < > = values in this interval not displayed. Cardiac Enzymes: No results for input(s): CKTOTAL, CKMB, CKMBINDEX, TROPONINI in the last 168 hours. CBG:  Recent Labs Lab 08/21/17 2126 08/22/17 0743 08/22/17 1138 08/22/17 1556 08/22/17 2116  GLUCAP 109* 117* 148* 100* 113*    Iron Studies: No results for input(s):  IRON, TIBC, TRANSFERRIN, FERRITIN in the last 72 hours. Studies/Results: No results found. Medications: Infusions: . sodium chloride Stopped (08/08/17 0900)  . sodium chloride    . sodium chloride 20 mL (08/17/17 2044)  . sodium chloride    . sodium chloride    . lactated ringers 10 mL/hr at 08/10/17 0800    Scheduled Medications: . allopurinol  200 mg Oral Daily  . amiodarone  200 mg Oral BID  . aspirin EC  81 mg Oral Daily   Or  . aspirin  81 mg Per Tube Daily  . bisacodyl  10 mg Oral Daily   Or  . bisacodyl  10 mg Rectal Daily  . Chlorhexidine Gluconate Cloth  6 each Topical Q0600  . darbepoetin (ARANESP) injection - NON-DIALYSIS  60 mcg Subcutaneous Q Tue-1800  . docusate sodium  200 mg Oral Daily  . feeding supplement (ENSURE ENLIVE)  237 mL Oral BID BM  . finasteride  5 mg Oral Daily  . furosemide  40 mg Intravenous BID  . insulin aspart  0-9 Units Subcutaneous TID WC  . metoprolol tartrate  12.5 mg Oral BID  . pantoprazole (PROTONIX) IV  40 mg Intravenous Q24H  . rosuvastatin  10 mg Oral q1800  . sodium chloride flush  10-40 mL Intracatheter Q12H  . sodium chloride flush  3 mL Intravenous Q12H  . sorbitol  15 mL Oral Once  . Warfarin - Physician Dosing Inpatient   Does not apply q1800    have reviewed scheduled and prn medications.  Physical Exam: General:  no c/o's today  Heart: HR better Lungs: prolonged exp phase Abdomen: soft, non tender Extremities: minimal edema Dialysis Access: vascath placed 9/19 - will remove today    08/23/2017,8:06 AM  LOS: 23 days

## 2017-08-23 NOTE — Progress Notes (Signed)
Subjective:   Complains of back pain.  Ambulatory in hall and remains in sinus rhythm.  Objective:  Vital Signs in the last 24 hours: BP (!) 120/48 (BP Location: Right Arm)   Pulse 91   Temp 98 F (36.7 C) (Oral)   Resp 20   Ht 5\' 7"  (1.702 m)   Wt 74.3 kg (163 lb 12.8 oz)   SpO2 100%   BMI 25.66 kg/m   Physical Exam: Elderly WM in NAD, HOH Lungs:  Reduced BS Cardiac:  regular rhythm, normal S1 and S2, no S3 Extremities:  No edema present  Intake/Output from previous day: 09/29 0701 - 09/30 0700 In: 520 [P.O.:480; I.V.:40] Out: 1025 [Urine:1025] Weight Filed Weights   08/21/17 0600 08/22/17 0700 08/23/17 0500  Weight: 74.9 kg (165 lb 2 oz) 74.6 kg (164 lb 7.4 oz) 74.3 kg (163 lb 12.8 oz)    Lab Results: Basic Metabolic Panel:  Recent Labs  08/22/17 0612 08/23/17 0343  NA 136 138  K 3.7 3.6  CL 100* 104  CO2 20* 22  GLUCOSE 93 105*  BUN 83* 85*  CREATININE 5.22* 5.43*    CBC:  Recent Labs  08/23/17 0343 08/23/17 1204  WBC 5.8 7.1  HGB 9.3* 9.4*  HCT 27.6* 28.4*  MCV 99.3 100.0  PLT 129* 152    BNP    Component Value Date/Time   BNP 560.8 (H) 08/01/2017 0057    Telemetry: Currently sinus with rate 72  Assessment/Plan:  1. Recent CABG 2. ARF on top of chronic but  UOP now increasing, Cr. Still going up but may be plateauing. 3. Prior stroke and dysphagia 4. Atrial fibrillation currently in sinus rhythm     Still making urine and hopefully rate of rise of creatinine and BUN is slowing.    Kerry Hough  MD [facility]  08/23/2017, 12:58 PM

## 2017-08-24 LAB — COMPREHENSIVE METABOLIC PANEL
ALT: 25 U/L (ref 17–63)
AST: 18 U/L (ref 15–41)
Albumin: 2.5 g/dL — ABNORMAL LOW (ref 3.5–5.0)
Alkaline Phosphatase: 65 U/L (ref 38–126)
Anion gap: 12 (ref 5–15)
BUN: 89 mg/dL — ABNORMAL HIGH (ref 6–20)
CO2: 22 mmol/L (ref 22–32)
Calcium: 9.2 mg/dL (ref 8.9–10.3)
Chloride: 104 mmol/L (ref 101–111)
Creatinine, Ser: 5.2 mg/dL — ABNORMAL HIGH (ref 0.61–1.24)
GFR calc Af Amer: 11 mL/min — ABNORMAL LOW (ref >60)
GFR calc non Af Amer: 9 mL/min — ABNORMAL LOW (ref >60)
Glucose, Bld: 105 mg/dL — ABNORMAL HIGH (ref 65–99)
Potassium: 3.5 mmol/L (ref 3.5–5.1)
Sodium: 138 mmol/L (ref 135–145)
Total Bilirubin: 1.1 mg/dL (ref 0.3–1.2)
Total Protein: 6.2 g/dL — ABNORMAL LOW (ref 6.5–8.1)

## 2017-08-24 LAB — PROTIME-INR
INR: 2.43
Prothrombin Time: 26.2 seconds — ABNORMAL HIGH (ref 11.4–15.2)

## 2017-08-24 LAB — IRON AND TIBC
Iron: 38 ug/dL — ABNORMAL LOW (ref 45–182)
Saturation Ratios: 14 % — ABNORMAL LOW (ref 17.9–39.5)
TIBC: 280 ug/dL (ref 250–450)
UIBC: 242 ug/dL

## 2017-08-24 LAB — FERRITIN: FERRITIN: 234 ng/mL (ref 24–336)

## 2017-08-24 LAB — PHOSPHORUS: PHOSPHORUS: 6.1 mg/dL — AB (ref 2.5–4.6)

## 2017-08-24 MED ORDER — PANTOPRAZOLE SODIUM 40 MG PO TBEC
40.0000 mg | DELAYED_RELEASE_TABLET | Freq: Every day | ORAL | Status: DC
  Administered 2017-08-24 – 2017-08-27 (×4): 40 mg via ORAL
  Filled 2017-08-24 (×4): qty 1

## 2017-08-24 MED ORDER — WARFARIN SODIUM 1 MG PO TABS
1.0000 mg | ORAL_TABLET | Freq: Every day | ORAL | Status: DC
  Filled 2017-08-24: qty 1

## 2017-08-24 MED ORDER — SODIUM CHLORIDE 0.9 % IV SOLN
510.0000 mg | Freq: Once | INTRAVENOUS | Status: AC
  Administered 2017-08-24: 510 mg via INTRAVENOUS
  Filled 2017-08-24: qty 17

## 2017-08-24 NOTE — Progress Notes (Signed)
[facility] Rounding Note  Subjective:   UOP about a liter/day past 2 days Creatinine trending down just a bit Now 15 days post CABG (CRRT stopped 9/24 - temp cath removed and pt has stated he will not do any further dialysis)   Objective Vital signs in last 24 hours: Vitals:   08/24/17 0725 08/24/17 0800 08/24/17 0900 08/24/17 1058  BP:  (!) 125/44 133/70 (!) 107/47  Pulse:  92 96 88  Resp:  (!) 21 16   Temp: 97.6 F (36.4 C)     TempSrc: Oral     SpO2:  100% 100%   Weight:      Height:       Weight change: 0.9 kg (1 lb 15.7 oz)  Intake/Output Summary (Last 24 hours) at 08/24/17 1106 Last data filed at 08/24/17 0800  Gross per 24 hour  Intake              223 ml  Output             1375 ml  Net            -1152 ml    Physical Exam: Extremely hard of hearing  General:  no c/o's today  Heart: HR 80's and regular Sternotomy scar healing Lungs: prolonged exp phase, no crackles, diminished at bases Abdomen: soft, non tender Extremities: NO edema whatsoever   Recent Labs Lab 08/18/17 0355  08/19/17 0332  08/22/17 0612 08/23/17 0343 08/24/17 0416  NA 131*  < > 130*  < > 136 138 138  K 3.3*  < > 3.5  < > 3.7 3.6 3.5  CL 97*  < > 96*  < > 100* 104 104  CO2 21*  --  20*  < > 20* 22 22  GLUCOSE 117*  < > 103*  < > 93 105* 105*  BUN 46*  < > 65*  < > 83* 85* 89*  CREATININE 2.73*  < > 3.77*  < > 5.22* 5.43* 5.20*  CALCIUM 8.0*  --  8.3*  < > 9.1 9.1 9.2  PHOS 5.4*  --  5.6*  --   --   --  6.1*  < > = values in this interval not displayed.   Recent Labs Lab 08/22/17 0612 08/23/17 0343 08/24/17 0416  AST 24 20 18   ALT 31 28 25   ALKPHOS 77 72 65  BILITOT 0.7 0.8 1.1  PROT 5.9* 6.0* 6.2*  ALBUMIN 2.4* 2.4* 2.5*    Iron/TIBC/Ferritin/ %Sat    Component Value Date/Time   IRON 38 (L) 08/24/2017 0416   TIBC 280 08/24/2017 0416   FERRITIN 234 08/24/2017 0416   IRONPCTSAT 14 (L) 08/24/2017 0416    Recent Labs Lab 08/21/17 0347  08/22/17 0612 08/22/17 0745  08/23/17 0343 08/23/17 1204  WBC 6.1  --  2.8* 6.6 5.8 7.1  HGB 9.5*  < > 16.9 9.9* 9.3* 9.4*  HCT 27.6*  < > 48.6 29.3* 27.6* 28.4*  MCV 98.2  --  98.2 98.7 99.3 100.0  PLT 114*  --  80* 159 129* 152  < > = values in this interval not displayed.   Recent Labs Lab 08/22/17 0743 08/22/17 1138 08/22/17 1556 08/22/17 2116 08/23/17 0806  GLUCAP 117* 148* 100* 113* 121*   Iron/TIBC/Ferritin/ %Sat    Component Value Date/Time   IRON 38 (L) 08/24/2017 0416   TIBC 280 08/24/2017 0416   FERRITIN 234 08/24/2017 0416   IRONPCTSAT 14 (L) 08/24/2017 0416  Medications: Infusions: . sodium chloride      Scheduled Medications: . allopurinol  200 mg Oral Daily  . amiodarone  200 mg Oral BID  . aspirin EC  81 mg Oral Daily   Or  . aspirin  81 mg Per Tube Daily  . bisacodyl  10 mg Oral Daily   Or  . bisacodyl  10 mg Rectal Daily  . Chlorhexidine Gluconate Cloth  6 each Topical Q0600  . darbepoetin (ARANESP) injection - NON-DIALYSIS  60 mcg Subcutaneous Q Tue-1800  . docusate sodium  200 mg Oral Daily  . feeding supplement (ENSURE ENLIVE)  237 mL Oral BID BM  . finasteride  5 mg Oral Daily  . furosemide  40 mg Oral BID  . metoprolol tartrate  12.5 mg Oral BID  . pantoprazole  40 mg Oral QHS  . rosuvastatin  10 mg Oral q1800  . sodium chloride flush  10-40 mL Intracatheter Q12H  . sodium chloride flush  3 mL Intravenous Q12H  . sorbitol  15 mL Oral Once  . [START ON 08/25/2017] warfarin  1 mg Oral q1800  . Warfarin - Physician Dosing Inpatient   Does not apply q1800   Assessment/Plan: 83 y.o. yo male with CKD who was admitted on 07/31/2017 w/angina/NSTEMI, had 2V CABG 08/07/17, AF with RVR, aspiration with ? PNA 9/18. Asked to see for AKI on CKD3 (bL creatinine mid 1's Patel).  CRRT 9/24-9/24. Temp cath out 9/30.     1. AKI on CKD3  - baseline crt 1.5-2.  AKI likely due to ATN after CABG. CRRT 9/20-9/24. Creatinine has since peaked around 5.4 and is 5.2 today. Not overly uremic.  Pt told Dr. Moshe Cipro that he would not want HD under any circumstances - this was confirmed by daughter Arrie Aran. Vascath removed 9/30. MAY be seeing early trend down in creatinine but remains to be seen. Following closely.  2. Volume - weight down from what looks like a high of 89 kg pre CRRT to a nadir of 79.4 when stopped but weights all over the place. Says his home weight is around 175-180 lb. 163-165 past 2 days so may actually be on the dry side.  Current lasix at 40 BID po (changed from IV yesterday). Will leave as is for now (prob not much effect from that dose anyway at creatine of 5) 3. Anemia- hgb was over 11 but now in the 9's - Aranesp 60 was added. TSat low at 14. Dose with Feraheme today. 4. Secondary hyperparathyroidism- phos if anything low- repleted 9/24- today 6.1. Add low dose ca acetate - should not require long term if renal function improves 5. Hypo K-  replete PRN 6. Hyponatremia- resolved  Jamal Maes, MD [facility] [phone] Pager 08/24/2017, 11:23 AM

## 2017-08-24 NOTE — Progress Notes (Signed)
Physical Therapy Treatment Patient Details Name: Stanley Robles MRN: [id number] DOB: 31-Dec-1931 Today's Date: 08/24/2017    History of Present Illness 83 yo admitted with NSTEMI s/p cath which showed severe left main disease and total right coronary artery stenosis, s/p CABG 08/08/17. experienced acute renal failure Placed on CRRT 08/12/17 removed from CRRT 08/17/17 PMhx:  CVA, sensorineural hearing loss, HLD, PVD, pAF, CKD stage 3, HTN, NSTEMI, chronic diastolic heart failure    PT Comments    Pt ready to walk with PT and requested to use RW instead of EVA walker because he did not like the feel of the EVA walker. Pt minAx2 for sit>stand. When pt stood condom catheter did not hold when pt became incontinent. Pt sat back down on recliner and said he did not want to walk without catheter due to his incontinence. Pt requested to transfer to [facility] as he felt he needed to use for bowel movement. Pt minAx2 for stand pivot to [facility]. Pt felt he would be there for a while and did not want to walk at this time. Tray table was moved in front of pt and nursing notified of his status. Nursing also requested OT order be placed as the patient has developed a tremor in his arms that is hindering his ability to feed himself. OT evaluation placed. Pt requires skilled PT to continue to progress his mobility and improve strength and endurance to safely discharge to SNF when ready.      Follow Up Recommendations  SNF     Equipment Recommendations  Other (comment) (to be determined at next venue)    Recommendations for Other Services OT consult     Precautions / Restrictions Precautions Precautions: Fall;Sternal Restrictions Weight Bearing Restrictions: Yes (sternal precautions)    Mobility  Bed Mobility               General bed mobility comments: in recliner at entry  Transfers Overall transfer level: Needs assistance Equipment used: Rolling walker (2 wheeled) Transfers: Sit to/from [employer]  Transfers Sit to Stand: +2 physical assistance Stand pivot transfers: Min assist;+2 physical assistance       General transfer comment: minAx2 for power up for both sit>stand and stand pivot to [facility], pt with good use of pillow to maintain sternal precaution        Balance Overall balance assessment: Needs assistance Sitting-balance support: Feet supported;Bilateral upper extremity supported Sitting balance-Leahy Scale: Fair Sitting balance - Comments: able to maintain seated balance at edge of recliner   Standing balance support: Bilateral upper extremity supported Standing balance-Leahy Scale: Poor Standing balance comment: requires modA to maintain standing with EVA walker support                            Cognition Arousal/Alertness: Awake/alert Behavior During Therapy: WFL for tasks assessed/performed Overall Cognitive Status: Within Functional Limits for tasks assessed                                           General Comments General comments (skin integrity, edema, etc.): prior to activity BP 129/60, HR 90bpm, RR 18, SaO2 on RA 98%O2,       Pertinent Vitals/Pain Pain Assessment: Faces Faces Pain Scale: Hurts little more Pain Location: back Pain Descriptors / Indicators: Constant;Aching Pain Intervention(s): Monitored during session;Limited activity within patient's tolerance  PT Goals (current goals can now be found in the care plan section) Acute Rehab PT Goals PT Goal Formulation: With patient Time For Goal Achievement: 08/18/17 Potential to Achieve Goals: Fair    Frequency    Min 3X/week      PT Plan Current plan remains appropriate       AM-PAC PT "6 Clicks" Daily Activity  Outcome Measure  Difficulty turning over in bed (including adjusting bedclothes, sheets and blankets)?: Unable Difficulty moving from lying on back to sitting on the side of the bed? : Unable Difficulty sitting down on and standing up  from a chair with arms (e.g., wheelchair, bedside commode, etc,.)?: Unable Help needed moving to and from a bed to chair (including a wheelchair)?: A Lot Help needed walking in hospital room?: A Lot Help needed climbing 3-5 steps with a railing? : Total 6 Click Score: 8    End of Session Equipment Utilized During Treatment: Gait belt Activity Tolerance: Patient limited by fatigue Patient left: with nursing/sitter in room;in bed;with call bell/phone within reach Nurse Communication: Mobility status PT Visit Diagnosis: Difficulty in walking, not elsewhere classified (R26.2);Muscle weakness (generalized) (M62.81)     Time: 6314-9702 PT Time Calculation (min) (ACUTE ONLY): 18 min  Charges:  $Therapeutic Activity: 8-22 mins                    G Codes:       Stanley Robles B. Migdalia Dk PT, DPT Acute Rehabilitation  [phone] Pager [phone]     [place] 08/24/2017, 12:44 PM

## 2017-08-24 NOTE — Progress Notes (Signed)
  Speech Language Pathology Treatment: Dysphagia  Patient Details Name: Stanley Robles MRN: [id number] DOB: 07/25/1932 Today's Date: 08/24/2017 Time: 3235-5732 SLP Time Calculation (min) (ACUTE ONLY): 13 min  Assessment / Plan / Recommendation Clinical Impression  Pt looks better than this SLP has seen in recent past. No increased work of breathing at rest. Consumed nectar thick juice with mildly wet vocal quality and declined offer of pudding/applesauce. RN reported pt was in too much pain to eat this morning and pt stated this as well. Encouraged him to eat as much as lunch as able. Due to decreased endurance, pt not quite ready to upgrade from Dys 1 yet; continue nectar.    HPI HPI: Pt is an 83 yo male admitted with NSTEMI s/p cath which showed severe left main disease and total right coronary artery stenosis, potential CABG planned for 9/14. 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 Was evaluated by ST this admission prior to CABG; MBS 08/05/17 revealed mild oropharyngeal and cervical esophageal dysphagia, suspected to be more chronic in nature given his h/o dysphagia from prior CVA and h/o GERD. Dys 3, thin liquids, no straws recommended with ST to follow briefly through surgery given his history. Pt was intubated for CABG on 9/14, transported back to floor on vent and subsequent extubated at 10pm same day. Pt was consuming Dys 2, nectar > developed ileus > NPO and diagnostic reassessment this morning.        SLP Plan  Continue with current plan of care       Recommendations  Diet recommendations: Nectar-thick liquid;Dysphagia 1 (puree) Liquids provided via: Cup;No straw Medication Administration: Crushed with puree Supervision: Patient able to self feed;Full supervision/cueing for compensatory strategies Compensations: Slow rate;Small sips/bites Postural Changes and/or Swallow  Maneuvers: Seated upright 90 degrees                Oral Care Recommendations: Oral care BID Follow up Recommendations: Skilled Nursing facility SLP Visit Diagnosis: Dysphagia, unspecified (R13.10) Plan: Continue with current plan of care       GO                Houston Siren 08/24/2017, 11:01 AM   [name] [name] [employer] [phone]

## 2017-08-24 NOTE — Progress Notes (Addendum)
[facility] DAILY ICU PROGRESS NOTE                   [address]            [place] [address]          [phone]   17 Days Post-Op Procedure(s) (LRB): CORONARY ARTERY BYPASS GRAFTING (CABG) x 2 WITH ENDOSCOPIC HARVESTING OF RIGHT SAPHENOUS VEIN. (N/A) TRANSESOPHAGEAL ECHOCARDIOGRAM (TEE) (N/A)  Total Length of Stay:  LOS: 24 days   Subjective: Extremely HOH, states he is in pain but mostly in his back  Objective: Vital signs in last 24 hours: Temp:  [97.3 F (36.3 C)-98.5 F (36.9 C)] 97.6 F (36.4 C) (10/01 0725) Pulse Rate:  [84-94] 87 (10/01 0600) Cardiac Rhythm: Normal sinus rhythm (10/01 0400) Resp:  [9-21] 11 (10/01 0600) BP: (81-138)/(37-86) 118/49 (10/01 0600) SpO2:  [98 %-100 %] 100 % (10/01 0600) Weight:  [165 lb 12.6 oz (75.2 kg)] 165 lb 12.6 oz (75.2 kg) (10/01 0500)  Filed Weights   08/22/17 0700 08/23/17 0500 08/24/17 0500  Weight: 164 lb 7.4 oz (74.6 kg) 163 lb 12.8 oz (74.3 kg) 165 lb 12.6 oz (75.2 kg)    Weight change: 1 lb 15.7 oz (0.9 kg)   Hemodynamic parameters for last 24 hours:    Intake/Output from previous day: 09/30 0701 - 10/01 0700 In: 323 [P.O.:280; I.V.:43] Out: 1075 [Urine:1075]  Intake/Output this shift: No intake/output data recorded.  Current Meds: Scheduled Meds: . allopurinol  200 mg Oral Daily  . amiodarone  200 mg Oral BID  . aspirin EC  81 mg Oral Daily   Or  . aspirin  81 mg Per Tube Daily  . bisacodyl  10 mg Oral Daily   Or  . bisacodyl  10 mg Rectal Daily  . Chlorhexidine Gluconate Cloth  6 each Topical Q0600  . darbepoetin (ARANESP) injection - NON-DIALYSIS  60 mcg Subcutaneous Q Tue-1800  . docusate sodium  200 mg Oral Daily  . feeding supplement (ENSURE ENLIVE)  237 mL Oral BID BM  . finasteride  5 mg Oral Daily  . furosemide  40 mg Oral BID  . metoprolol tartrate  12.5 mg Oral BID  . pantoprazole (PROTONIX) IV  40 mg Intravenous Q24H  . rosuvastatin  10 mg Oral q1800  . sodium chloride  flush  10-40 mL Intracatheter Q12H  . sodium chloride flush  3 mL Intravenous Q12H  . sorbitol  15 mL Oral Once  . [START ON 08/25/2017] warfarin  1 mg Oral q1800  . Warfarin - Physician Dosing Inpatient   Does not apply q1800   Continuous Infusions: . sodium chloride     PRN Meds:.HYDROcodone-acetaminophen, levalbuterol, metoprolol tartrate, ondansetron (ZOFRAN) IV, RESOURCE THICKENUP CLEAR, sodium chloride flush, sodium chloride flush, white petrolatum  General appearance: cooperative, no distress, extremely hard of hearing Heart: regular rate and rhythm, S1, S2 normal, no murmur, click, rub or gallop Lungs: clear to auscultation bilaterally Abdomen: soft, non-tender; bowel sounds normal; no masses,  no organomegaly Extremities: extremities normal, atraumatic, no cyanosis or edema Wound: c/d/i sternal incision  Lab Results: CBC: Recent Labs  08/23/17 0343 08/23/17 1204  WBC 5.8 7.1  HGB 9.3* 9.4*  HCT 27.6* 28.4*  PLT 129* 152   BMET:  Recent Labs  08/23/17 0343 08/24/17 0416  NA 138 138  K 3.6 3.5  CL 104 104  CO2 22 22  GLUCOSE 105* 105*  BUN 85* 89*  CREATININE 5.43* 5.20*  CALCIUM 9.1 9.2    CMET: Lab Results  Component Value Date   WBC 7.1 08/23/2017   HGB 9.4 (L) 08/23/2017   HCT 28.4 (L) 08/23/2017   PLT 152 08/23/2017   GLUCOSE 105 (H) 08/24/2017   CHOL 162 02/28/2015   TRIG 104 08/17/2017   HDL 31 (L) 02/28/2015   LDLCALC 102 (H) 02/28/2015   ALT 25 08/24/2017   AST 18 08/24/2017   NA 138 08/24/2017   K 3.5 08/24/2017   CL 104 08/24/2017   CREATININE 5.20 (H) 08/24/2017   BUN 89 (H) 08/24/2017   CO2 22 08/24/2017   TSH 3.035 08/04/2017   INR 2.43 08/24/2017   HGBA1C 5.3 08/04/2017      PT/INR:  Recent Labs  08/24/17 0416  LABPROT 26.2*  INR 2.43   Radiology: No results found.   Assessment/Plan: S/P Procedure(s) (LRB): CORONARY ARTERY BYPASS GRAFTING (CABG) x 2 WITH ENDOSCOPIC HARVESTING OF RIGHT SAPHENOUS VEIN.  (N/A) TRANSESOPHAGEAL ECHOCARDIOGRAM (TEE) (N/A)  1. CV-hx of atrial fibrillation, now in NSR in the 90s. BP well controlled on Lopressor. On Amio PO.  2. Pulm-tolerating room air with good oxygen saturation. CXR this morning showed cardiomegaly, improved bibasilar infiltrates and edema, tiny bilateral pleural effusions.   3. Renal-creatinine down from 5.43 to 5.20. On Lasix 40mg  IV BID. Electrolytes okay. Nephrology following. Fluid balance remains negative.  4. H and H stable, platelets trending up.  5. On Coumadin and ASA 81, INR 2.43 today. 6. Endo- blood glucose level well controlled on current regimen.  7. Impaired swallowing, is only on thicken liquids 8. Still has pain in his back but not so much in his chest  Plan: Continue diuretic regimen for fluid overload. Hold coumadin dose tonight and pull epicardial pacing wires in the morning. Resume coumadin tomorrow night.   Elgie Collard 08/24/2017 8:42 AM   Maintaining sinus rhythm Urine output adequate, creatinine plateaued at 5.2 Ready to transfer to stepdown then home with home RN, home PT    patient examined and medical record reviewed,agree with above note. Tharon Aquas Trigt III 08/24/2017

## 2017-08-24 NOTE — Social Work (Signed)
CSW reviewed case. Pt will transition to [facility] when medically stable.  Elissa Hefty, LCSW Clinical Social Worker [phone]

## 2017-08-24 NOTE — Progress Notes (Signed)
Subjective:   Complains of back pain.  Mildly nauseated at present Objective:  Vital Signs in the last 24 hours: BP (!) 107/47   Pulse 88   Temp 97.6 F (36.4 C) (Oral)   Resp 16   Ht 5\' 7"  (1.702 m)   Wt 75.2 kg (165 lb 12.6 oz)   SpO2 100%   BMI 25.97 kg/m   Physical Exam: Elderly WM in NAD, HOH Lungs:  Reduced BS Cardiac:  regular rhythm, normal S1 and S2, no S3 Extremities:  No edema present  Intake/Output from previous day: 09/30 0701 - 10/01 0700 In: 323 [P.O.:280; I.V.:43] Out: 1075 [Urine:1075] Weight Filed Weights   08/22/17 0700 08/23/17 0500 08/24/17 0500  Weight: 74.6 kg (164 lb 7.4 oz) 74.3 kg (163 lb 12.8 oz) 75.2 kg (165 lb 12.6 oz)    Lab Results: Basic Metabolic Panel:  Recent Labs  08/23/17 0343 08/24/17 0416  NA 138 138  K 3.6 3.5  CL 104 104  CO2 22 22  GLUCOSE 105* 105*  BUN 85* 89*  CREATININE 5.43* 5.20*    CBC:  Recent Labs  08/23/17 0343 08/23/17 1204  WBC 5.8 7.1  HGB 9.3* 9.4*  HCT 27.6* 28.4*  MCV 99.3 100.0  PLT 129* 152    BNP    Component Value Date/Time   BNP 560.8 (H) 08/01/2017 0057    Telemetry: Currently sinus with rate 72  Assessment/Plan:  1. Recent CABG 2. ARF on top of chronic but  UOP now increasing, Cr a little less today but might be leveling out.  Renal note appreciated 3. Prior stroke and dysphagia 4. Atrial fibrillation currently in sinus rhythm     Still making urine and hopefully  May see some renal turn around.     Kerry Hough  MD [facility]  08/24/2017, 1:08 PM

## 2017-08-24 NOTE — Discharge Summary (Signed)
Physician Discharge Summary  Patient ID: Stanley Robles MRN: [id number] DOB/AGE: 81-04-1932 81 y.o.  Admit date: 07/31/2017 Discharge date: 08/28/2017  Admission Diagnoses: 1. Coronary artery disease 2. NSTEMI (non-ST elevated myocardial infarction) ([place])  Active Diagnoses:  1. Hyperlipidemia 2. Hypertensive heart disease 3. Chronic kidney disease stage 3 4. Right pontine stroke ([place]) 5. Peripheral vascular disease ([facility]) 6. Paroxysmal atrial fibrillation ([place]) 7. Cancer ([place]) 8.  BPH (benign prostatic hypertrophy) 9. GERD (gastroesophageal reflux disease) 10. Memory loss 11. Personal history of transient ischemic attack (TIA) and cerebral infarction without residual deficit 12.  Incontinence of urine 13. Tooth disease 14. Osteoarthritis 15. Remote tobacco abuse 16. Gout 17. Anemia of chronic disease  18. Pre op thromboctyopenia    Discharged Condition: good  HPI:  83 year old somewhat fragile Caucasian male with symptoms of unstable angina for the past several days. He was admitted 2 days ago with resting angina, diaphoresis, fatigue, and a troponin of 7.0 the patient has history of atrial fibrillation and is on chronic eliquis. The patient had a right pontine stroke 2 years ago which has had a significant impact on his overall functional status with specific problems related to swallowing from the brainstem stroke. He has not had any eliquis  since admission to the hospital when he was placed on heparin. His echocardiogram showed normal LV systolic function with mild-moderate mitral regurgitation. The patient had a transfemoral cardiac catheterization showing significant left main and proximal LAD stenosis with chronic occlusion of the RCA. LV EDP was elevated 25 mmHg. The patient is currently still somewhat sedated after the procedure but I was able to obtain adequate history and review of systems by patient as helped by family. The patient has significant hearing loss.  The  patient apparently is still definitely ambulatory and make a point to walk around his yard twice a day. He is able to visit friends and family. His weight has been stable. He has had no recent falls. He has had previous left total knee replacement.  Hospital Course:  [name] underwent a coronary bypass grafting 2 on 08/08/2017 with Dr. Prescott Gum. He tolerated the procedure well and was transferred to the ICU. He was extubated in a timely manner. Postop day 1 we began to mobilize the patient. We initiated diuretic regimen for fluid overload. We worked on blood glucose control. Postop day 2 he continued to progress. He did have some acute on chronic renal failure with a creatinine of 2.4. He remained in normal sinus rhythm. We obtained a space language evaluation due to a high risk for aspiration. Postop day 3 we began to ambulate the patient. Central line was removed at this time. He did have some acute postoperative blood loss anemia. He did have an episode of postoperative atrial fibrillation and IV amiodarone was initiated. 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 Postop day 4 we added low-dose diltiazem for atrial fibrillation as well as amiodarone. He was walking with physical therapy. He remained on low-dose milrinone and dopamine until his creatinine improves. Postop day 5 he required BiPAP support overnight. He did have a brief episode of emesis which resolved. He continued to have some acute on chronic renal failure. A hemodialysis catheter was placed. Postop day 7 he was found to have a postoperative colonic ileus measuring 8 cm cecum. He also had aspiration pneumonia which he was started on IV Zosyn for. He was started on IV heparin  for postoperative paroxysmal atrial fibrillation. He still required intermittent BiPAP. We initiated short-term TPN for nutrition. Speech pathology was consulted as he has had a previous stroke and diet  recommendations were followed accordingly. As of 08/28/2017, he is on a dysphagia 3 (Mech soft) solids;nectar thick liquid diet. Other liquid administration via: Cup;no straw; Medication Administration: Whole meds with puree; Supervision: Patient able to self feed;Intermittent supervision to cue for compensatory strategies; Compensations: Slow rate;Small sips/bites; Postural Changes: Seated upright at 90 degrees; Oral Care Recommendations: Oral care BID Other Recommendations: Order thickener from pharmacy;Prohibited food (jello, ice cream, thin soups);Remove water pitcher. Cardiology was consulted for management of persistent atrial fibrillation.  He continued to require milrinone. Nephrology was consulted and is helped optimize renal function. Postop day 9 he remained in rate controlled atrial fibrillation on low-dose milrinone. He was tolerating 2 L nasal cannula with good oxygen saturation. We tried to rehabilitation the patient as tolerated. Postop day 10 we stopped CRT to evaluate native renal function. We continued physical therapy as tolerated. We ordered a repeat swallow study to assess swallowing function. We continued IV heparin and amiodarone for intermittent atrial fibrillation. Postop day 12 we continued antibiotics for aspiration pneumonia which the patient was responding to. We continue to monitor renal function very closely. He remained in rate controlled atrial fibrillation. Postop day 13 we transitioned from IV heparin to oral warfarin for atrial fibrillation. Postop day 14 the patient was able to walk 150 feet with assistance. He remained intermittent atrial fibrillation with stable blood pressure. He had excellent urine output showing recovery of his acute on chronic renal failure. His hemoglobin remained stable. Consult was placed for inpatient rehabilitation and he was declined. We continued an aggressive diuretic regimen with renal following along for recommendations. INR continued to climb on  Coumadin. Postop day 17 we decided to hold the Coumadin dose to be able to pull the epicardial pacing wires;however, despite holding Coumadin, his INR did increase to 3.4.The patient was stable to be transferred to the telemetry unit for continued care. Per Dr. Prescott Gum, patient to be discharged on 1 mg o Coumadin and will start on 08/29/2017. He will need a PT and INR drawn on Monday 08/31/2017 with results called or faxed to Dr. Thurman Coyer office. Epicardial pacing wires and PICC were removed on 08/28/2017. Per Dr. Prescott Gum, patient is surgically stable for discharge to SNF today.  We ask the SNF to please do the following: 1. Please obtain vital signs at least one time daily 2.Please weigh the patient daily. If he or she continues to gain weight or develops lower extremity edema, contact the office at [phone]. 3. Ambulate patient at least three times daily and please use sternal precautions.  Consults: cardiology, internal medicine, nephrology  Significant Diagnostic Studies: ------------------------------------------------------------------- Transthoracic Echocardiography  Patient:    Stanley Robles, Stanley Robles MR #:       [id number] Study Date: 08/02/2017 Gender:     M Age:        26 Height:     170.2 cm Weight:     77.5 kg BSA:        1.93 m^2 Pt. Status: Room:       [facility], Inpatient  ADMITTING    Esmond Plants, Waqas T  SONOGRAPHER  Cardell Peach, RDCS  cc:  ------------------------------------------------------------------- LV EF: 65% -   70%  ------------------------------------------------------------------- History:  PMH:  chest pain  Atrial fibrillation.  Coronary artery disease.  PMH:   Myocardial infarction.  Risk factors:  Hypertension. Dyslipidemia.  ------------------------------------------------------------------- Study Conclusions  - Left ventricle: The cavity size was normal.  There was mild   concentric hypertrophy. Systolic function was vigorous. The   estimated ejection fraction was in the range of 65% to 70%. Wall   motion was normal; there were no regional wall motion   abnormalities. - Aortic valve: There was mild regurgitation. - Mitral valve: Calcified annulus. There was moderate   regurgitation. - Left atrium: The atrium was mildly dilated. - Right ventricle: The cavity size was mildly dilated. Wall   thickness was normal. - Pulmonary arteries: Systolic pressure was mildly increased. PA   peak pressure: 33 mm Hg (S).  Impressions:  - Compared to the prior study, there has been no significant   interval change.  ------------------------------------------------------------------- Study data:  Comparison was made to the study of 02/28/2015.  Study status:  Routine.  Procedure:  Transthoracic echocardiography. Image quality was adequate.  Study completion:  There were no complications.          Transthoracic echocardiography.  M-mode, complete 2D, spectral Doppler, and color Doppler.  Birthdate: Patient birthdate: [birth year].  Age:  Patient is 83 yr old.  Sex: Gender: male.    BMI: 26.8 kg/m^2.  Blood pressure:     106/50 Patient status:  Inpatient.  Study date:  Study date: 08/02/2017. Study time: 12:53 PM.  Location:  Bedside.  -------------------------------------------------------------------  ------------------------------------------------------------------- Left ventricle:  The cavity size was normal. There was mild concentric hypertrophy. Systolic function was vigorous. The estimated ejection fraction was in the range of 65% to 70%. Wall motion was normal; there were no regional wall motion abnormalities.  ------------------------------------------------------------------- Aortic valve:   Trileaflet; normal thickness leaflets. Mobility was not restricted.  Doppler:  Transvalvular velocity was within the normal range. There was  no stenosis. There was mild regurgitation.   ------------------------------------------------------------------- Aorta:  Aortic root: The aortic root was normal in size.  ------------------------------------------------------------------- Mitral valve:   Calcified annulus. Mobility was not restricted. Doppler:  Transvalvular velocity was within the normal range. There was no evidence for stenosis. There was moderate regurgitation. Peak gradient (D): 3 mm Hg.  ------------------------------------------------------------------- Left atrium:  The atrium was mildly dilated.  ------------------------------------------------------------------- Right ventricle:  The cavity size was mildly dilated. Wall thickness was normal. Systolic function was normal.  ------------------------------------------------------------------- Pulmonic valve:   Poorly visualized.  Structurally normal valve. Cusp separation was normal.  Doppler:  Transvalvular velocity was within the normal range. There was no evidence for stenosis. There was no regurgitation.  ------------------------------------------------------------------- Tricuspid valve:   Structurally normal valve.    Doppler: Transvalvular velocity was within the normal range. There was mild regurgitation.  ------------------------------------------------------------------- Pulmonary artery:   The main pulmonary artery was normal-sized. Systolic pressure was mildly increased.  ------------------------------------------------------------------- Right atrium:  The atrium was normal in size.  ------------------------------------------------------------------- Pericardium:  There was no pericardial effusion.  ------------------------------------------------------------------- Systemic veins: Inferior vena cava: The vessel was normal in size.  CLINICAL DATA:  Status post CABG on August 07, 2017  EXAM: CHEST  2 VIEW  COMPARISON:   Portable chest x-ray of August 21, 2017.  FINDINGS: The lungs are adequately inflated. There is persistent increased density in the retrocardiac region. There is no pneumothorax or significant pleural effusion. The cardiac silhouette is top-normal in size. The pulmonary vascularity is not engorged. The sternal wires are intact. The left PICC line tip  projects over the midportion of the SVC. The bony thorax exhibits no acute abnormality.  IMPRESSION: Persistent left lower lobe atelectasis or pneumonia. No significant pulmonary edema or pleural effusion.   Electronically Signed   By: David  Martinique M.D.   On: 08/26/2017 07:45   Treatments: LEFT HEART CATH AND CORONARY ANGIOGRAPHY by Dr. Wynonia Lawman on 08/03/2017:  Conclusion     Ost LM lesion, 75 %stenosed.   1. Severe ostial stenosis of left circumflex with a 95% ostial lesion 2. Severe left main stenosis with calcification and a filling defect that may represent a plaque rupture 3. Chronic total occlusion of the right coronary artery collateralized by left to right collaterals 4. Normal LV function by echo  Recommendation: With severe left main disease and severe stenosis at the ostium of the left circumflex recommend cardiac surgical consultation for CABG. I don't think the patient's coronary disease is amenable to PCI. Discussed with Dr Wynonia Lawman.    1. Coronary artery bypass grafting x2 (left internal mammary artery to     left anterior descending, saphenous vein graft to circumflex     marginal). 2. Endoscopic harvest of right leg greater saphenous vein. 3. Right axillary artery cannulation for cardiopulmonary bypass by Dr. Prescott Gum on 08/08/2017.  NAMEFREDDIE, Stanley Robles NO.:  [id number]  MEDICAL RECORD NO.:  [id number]  LOCATION:  [facility]                        FACILITY:  [facility]  PHYSICIAN:  Ivin Poot, M.D.  DATE OF BIRTH:  Jun 10, 1932  DATE OF PROCEDURE:  08/12/2017 DATE OF  DISCHARGE:                              OPERATIVE REPORT   OPERATION:  Placement of hemodialysis catheter for acute postoperative renal failure on 08/12/2017.  SURGEON:  Ivin Poot, MD.  ANESTHESIA:  Local with 1% lidocaine.  INDICATIONS:  Informed consent was obtained from the patient and family for placement of a Trialysis catheter for temporary postoperative hemodialysis for renal failure after CABG in this 83 year old chronically ill male with history of preoperative chronic renal failure.  Discharge Exam: Blood pressure (!) 107/37, pulse 88, temperature 97.6 F (36.4 C), temperature source Oral, resp. rate 18, height 5\' 7"  (1.702 m), weight 158 lb 8 oz (71.9 kg), SpO2 100 %.    Cardiovascular: RRR Pulmonary: Clear to auscultation bilaterally Abdomen: Soft, non tender, bowel sounds present. Extremities: No lower extremity edema. Wounds: Clean and dry.  No erythema or signs of infection.  Condition and disposition: Stable and to SNF.  Allergies as of 08/28/2017      Reactions   Lipitor [atorvastatin] Other (See Comments)   Severe constipation      Medication List    STOP taking these medications   apixaban 2.5 MG Tabs tablet Commonly known as:  ELIQUIS   atenolol 25 MG tablet Commonly known as:  TENORMIN   lisinopril 20 MG tablet Commonly known as:  PRINIVIL,ZESTRIL   MYRBETRIQ 50 MG Tb24 tablet Generic drug:  mirabegron ER   nitroGLYCERIN 0.4 MG SL tablet Commonly known as:  NITROSTAT     TAKE these medications   acetaminophen 500 MG tablet Commonly known as:  TYLENOL Take 1,000 mg by mouth every 6 (six) hours as needed for pain.   allopurinol 300  MG tablet Commonly known as:  ZYLOPRIM Take 0.5 tablets (150 mg total) by mouth daily. What changed:  how much to take  how to take this   amiodarone 200 MG tablet Commonly known as:  PACERONE Take 1 tablet (200 mg total) by mouth daily.   aspirin 81 MG EC tablet Take 1 tablet (81 mg  total) by mouth daily.   docusate sodium 100 MG capsule Commonly known as:  COLACE Take 100 mg by mouth daily.   feeding supplement (ENSURE ENLIVE) Liqd Take 237 mLs by mouth 2 (two) times daily between meals.   finasteride 5 MG tablet Commonly known as:  PROSCAR Take 5 mg by mouth daily.   metoprolol tartrate 25 MG tablet Commonly known as:  LOPRESSOR Take 0.5 tablets (12.5 mg total) by mouth 2 (two) times daily.   omeprazole 20 MG capsule Commonly known as:  PRILOSEC Take 20 mg by mouth daily.   rosuvastatin 10 MG tablet Commonly known as:  CRESTOR Take 1 tablet (10 mg total) by mouth daily. What changed:  medication strength  how much to take  how to take this   Vitamin D3 2000 units Tabs Take 2,000 Units by mouth daily.   warfarin 1 MG tablet Commonly known as:  COUMADIN Take 1 tablet (1 mg total) by mouth daily at 6 PM.       Contact information for follow-up providers    Prescott Gum, Collier Salina, MD. Go on 09/30/2017.   Specialty:  Cardiothoracic Surgery Why:  Your appointment is on 09/30/2017 at 11:00am. Please arrive at 10:30am for a PA/LAT chest x ray located on the first floor of our building.  Contact information: [address] [place] [address] [phone]        Jacolyn Reedy, MD. Call.   Specialty:  Cardiology Why:  For a follow up appointment for 2 weeks after discharge from the hospital. Contact information: [place] [address] [phone]        Wellington Hampshire, MD Follow up.   Specialty:  Cardiology Why:  Dr. Fletcher Anon 10/22 @ 2pm [facility]). Please bring your medication list Contact information: [place] [address] [phone]            Contact information for after-discharge care    [facility] Follow up.   Specialty:  [place] Why:  Please draw a PT and INR on Monday 09/01/2007. Please  call or fax results to Dr. Thurman Coyer office Contact information: [address] [place] [phone]                 The patient has been discharged on:   1.Beta Blocker:  Yes [  x ]                              No   [   ]                              If No, reason:  2.Ace Inhibitor/ARB: Yes [   ]                                     No  [  x  ]                                     If No, reason: renal failure  3.Statin:   Yes [ x  ]                  No  [   ]                  If No, reason:  4.Ecasa:  Yes  [ x  ]                  No   [   ]                  If No, reason:   Signed: ZIMMERMAN,DONIELLE M PA-C 08/28/2017, 9:22 AM

## 2017-08-24 NOTE — Care Management Note (Signed)
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                 Action/Plan: NCM will follow with CSW for dc needs.   Expected Discharge Date:                  Expected Discharge Plan:  Skilled Nursing Facility  In-House Referral:  Clinical Social Work  Discharge planning Services  CM Consult  Post Acute Care Choice:    Choice offered to:     DME Arranged:    DME Agency:     HH Arranged:    [place] Agency:     Status of Service:  Completed, signed off  If discussed at [employer], dates discussed:    Additional Comments:  [name], RN 08/24/2017, 10:53 AM

## 2017-08-25 LAB — CBC
HCT: 28.7 % — ABNORMAL LOW (ref 39.0–52.0)
Hemoglobin: 9.4 g/dL — ABNORMAL LOW (ref 13.0–17.0)
MCH: 32.9 pg (ref 26.0–34.0)
MCHC: 32.8 g/dL (ref 30.0–36.0)
MCV: 100.3 fL — ABNORMAL HIGH (ref 78.0–100.0)
Platelets: 164 10*3/uL (ref 150–400)
RBC: 2.86 MIL/uL — ABNORMAL LOW (ref 4.22–5.81)
RDW: 16.8 % — ABNORMAL HIGH (ref 11.5–15.5)
WBC: 7.1 10*3/uL (ref 4.0–10.5)

## 2017-08-25 LAB — RENAL FUNCTION PANEL
ANION GAP: 14 (ref 5–15)
Albumin: 2.4 g/dL — ABNORMAL LOW (ref 3.5–5.0)
BUN: 99 mg/dL — AB (ref 6–20)
CHLORIDE: 103 mmol/L (ref 101–111)
CO2: 22 mmol/L (ref 22–32)
Calcium: 9.4 mg/dL (ref 8.9–10.3)
Creatinine, Ser: 5.25 mg/dL — ABNORMAL HIGH (ref 0.61–1.24)
GFR calc Af Amer: 11 mL/min — ABNORMAL LOW (ref >60)
GFR, EST NON AFRICAN AMERICAN: 9 mL/min — AB (ref >60)
Glucose, Bld: 125 mg/dL — ABNORMAL HIGH (ref 65–99)
PHOSPHORUS: 5.8 mg/dL — AB (ref 2.5–4.6)
POTASSIUM: 3.5 mmol/L (ref 3.5–5.1)
Sodium: 139 mmol/L (ref 135–145)

## 2017-08-25 LAB — PROTIME-INR
INR: 2.72
Prothrombin Time: 28.6 seconds — ABNORMAL HIGH (ref 11.4–15.2)

## 2017-08-25 NOTE — Progress Notes (Signed)
18 Days Post-Op Procedure(s) (LRB): CORONARY ARTERY BYPASS GRAFTING (CABG) x 2 WITH ENDOSCOPIC HARVESTING OF RIGHT SAPHENOUS VEIN. (N/A) TRANSESOPHAGEAL ECHOCARDIOGRAM (TEE) (N/A) Subjective: Renal fx stablilized creat 5.0, making urine nsr Walking on room air Incisions clean Will DC EPWs when INR , 1.7 Will only need coumadin 1 mg QOD at Discharge plan CIR or SNF later this week DC PICC prior to DC Objective: Vital signs in last 24 hours: Temp:  [97.4 F (36.3 C)-97.7 F (36.5 C)] 97.7 F (36.5 C) (10/02 0717) Pulse Rate:  [98-128] 98 (10/02 0717) Cardiac Rhythm: Normal sinus rhythm (10/02 0731) Resp:  [9-24] 16 (10/02 0717) BP: (85-130)/(44-67) 122/50 (10/02 1602) SpO2:  [99 %-100 %] 100 % (10/02 0717) Weight:  [157 lb 13.6 oz (71.6 kg)] 157 lb 13.6 oz (71.6 kg) (10/02 0600)  Hemodynamic parameters for last 24 hours:  stable  Intake/Output from previous day: 10/01 0701 - 10/02 0700 In: 357 [P.O.:240; IV Piggyback:117] Out: 1150 [Urine:1150] Intake/Output this shift: No intake/output data recorded.       Exam    General- alert and comfortable   Lungs- clear without rales, wheezes   Cor- regular rate and rhythm, no murmur , gallop   Abdomen- soft, non-tender   Extremities - warm, non-tender, minimal edema   Neuro- oriented, appropriate, no focal weakness   Lab Results:  Recent Labs  08/23/17 1204 08/25/17 0334  WBC 7.1 7.1  HGB 9.4* 9.4*  HCT 28.4* 28.7*  PLT 152 164   BMET:  Recent Labs  08/24/17 0416 08/25/17 0334  NA 138 139  K 3.5 3.5  CL 104 103  CO2 22 22  GLUCOSE 105* 125*  BUN 89* 99*  CREATININE 5.20* 5.25*  CALCIUM 9.2 9.4    PT/INR:  Recent Labs  08/25/17 0334  LABPROT 28.6*  INR 2.72   ABG    Component Value Date/Time   PHART 7.581 (H) 08/17/2017 0325   HCO3 20.1 08/17/2017 0325   TCO2 22 08/21/2017 1644   ACIDBASEDEF 1.8 08/17/2017 0325   O2SAT 51.7 08/19/2017 0459   CBG (last 3)   Recent Labs  08/22/17 2116  08/23/17 0806  GLUCAP 113* 121*    Assessment/Plan: S/P Procedure(s) (LRB): CORONARY ARTERY BYPASS GRAFTING (CABG) x 2 WITH ENDOSCOPIC HARVESTING OF RIGHT SAPHENOUS VEIN. (N/A) TRANSESOPHAGEAL ECHOCARDIOGRAM (TEE) (N/A) Mobilize d/c pacing wires CIR or SNF thurs/fri   LOS: 25 days    Tharon Aquas Trigt III 08/25/2017

## 2017-08-25 NOTE — Evaluation (Signed)
Occupational Therapy Evaluation Patient Details Name: Stanley Robles MRN: [id number] DOB: 1932-10-02 Today's Date: 08/25/2017    History of Present Illness 83 yo admitted with NSTEMI s/p cath which showed severe left main disease and total right coronary artery stenosis, s/p CABG 08/08/17. experienced acute renal failure Placed on CRRT 08/12/17 removed from CRRT 08/17/17 PMhx:  CVA, sensorineural hearing loss, HLD, PVD, pAF, CKD stage 3, HTN, NSTEMI, chronic diastolic heart failure   Clinical Impression   PTA, pt was independent with cane for basic ADL and functional mobility and had assistance for all IADL. Pt currently requires mod assist for sit<>stand transfer in preparation for toilet transfer, max assist for toileting hygiene, min assist for UB ADL, and mod assist for LB ADL. Pt additionally presents with B UE tremor with intentional movements and generalized B UE weakness impacting his ability to successfully complete self-feeding tasks. Pt demonstrates good understanding of sternal precautions and adheres to these during ADL tasks but requires cues to verbalize them. He would benefit from continued OT services to improve independence with ADL and functional mobility and recommend SNF level rehabilitation post-acute D/C. Will continue to follow.    Follow Up Recommendations  SNF;Supervision/Assistance - 24 hour    Equipment Recommendations  Other (comment) (TBD at next venue of care)    Recommendations for Other Services       Precautions / Restrictions Precautions Precautions: Fall;Sternal Restrictions Weight Bearing Restrictions: Yes (sternal precautions)      Mobility Bed Mobility               General bed mobility comments: OOB in chair on OT arrival  Transfers Overall transfer level: Needs assistance Equipment used: Rolling walker (2 wheeled) Transfers: Sit to/from [employer] Transfers Sit to Stand: Mod assist         General transfer comment: Mod  lifting assist. Able to complete multiple times.    Balance Overall balance assessment: Needs assistance Sitting-balance support: Feet supported;Bilateral upper extremity supported Sitting balance-Leahy Scale: Fair     Standing balance support: Bilateral upper extremity supported Standing balance-Leahy Scale: Poor Standing balance comment: Mod assist for                            ADL either performed or assessed with clinical judgement   ADL Overall ADL's : Needs assistance/impaired Eating/Feeding: Supervision/ safety;Sitting Eating/Feeding Details (indicate cue type and reason): Decreased endurance and limited by tremors.  Grooming: Moderate assistance;Standing   Upper Body Bathing: Minimal assistance;Sitting   Lower Body Bathing: Moderate assistance;Sit to/from stand   Upper Body Dressing : Minimal assistance;Sitting   Lower Body Dressing: Moderate assistance;Sit to/from stand   Toilet Transfer: Moderate assistance Toilet Transfer Details (indicate cue type and reason): Mod assist for sit<>stand in preparation for toilet transfer. Fatigues easily. Toileting- Clothing Manipulation and Hygiene: Maximal assistance;Sit to/from stand         General ADL Comments: Pt limited by decreased activity tolerance for ADL. Good demonstration of adherence to sternal precautions.      Vision Baseline Vision/History: Wears glasses Wears Glasses: At all times Patient Visual Report: No change from baseline Vision Assessment?: No apparent visual deficits     Perception     Praxis      Pertinent Vitals/Pain Pain Assessment: Faces Faces Pain Scale: Hurts even more Pain Location: back Pain Descriptors / Indicators: Constant;Aching Pain Intervention(s): Limited activity within patient's tolerance;Monitored during session     Hand Dominance  Extremity/Trunk Assessment Upper Extremity Assessment Upper Extremity Assessment: Generalized weakness;RUE  deficits/detail;LUE deficits/detail RUE Deficits / Details: Tremor with intentional activities LUE Deficits / Details: Tremor with intentional activities   Lower Extremity Assessment Lower Extremity Assessment: Generalized weakness   Cervical / Trunk Assessment Cervical / Trunk Assessment: Normal   Communication Communication Communication: HOH   Cognition Arousal/Alertness: Awake/alert Behavior During Therapy: WFL for tasks assessed/performed Overall Cognitive Status: Within Functional Limits for tasks assessed                                     General Comments  VSS    Exercises Exercises: General Lower Extremity   Shoulder Instructions      Home Living Family/patient expects to be discharged to:: Private residence Living Arrangements: Alone Available Help at Discharge: Family;Available 24 hours/day Type of Home: House Home Access: Stairs to enter [employer] of Steps: 3   Home Layout: One level     Bathroom Shower/Tub: Occupational psychologist: Standard     Home Equipment: Cane - single point;Shower seat - built in;Wheelchair - manual          Prior Functioning/Environment Level of Independence: Needs assistance  Gait / Transfers Assistance Needed: pt walks with a cane ADL's / Homemaking Assistance Needed: daughters assist with IADL            OT Problem List: Decreased strength;Decreased activity tolerance;Impaired balance (sitting and/or standing);Decreased safety awareness;Decreased knowledge of use of DME or AE;Decreased knowledge of precautions;Pain      OT Treatment/Interventions: Self-care/ADL training;Therapeutic exercise;Energy conservation;DME and/or AE instruction;Therapeutic activities;Patient/family education;Balance training    OT Goals(Current goals can be found in the care plan section) Acute Rehab OT Goals Patient Stated Goal: return home after surgery OT Goal Formulation: With patient Time For  Goal Achievement: 09/08/17 Potential to Achieve Goals: Good ADL Goals Pt Will Perform Grooming: with min assist;standing Pt Will Perform Upper Body Dressing: with supervision;sitting Pt Will Perform Lower Body Dressing: with min assist;sit to/from stand Pt Will Transfer to Toilet: with min assist;ambulating;bedside commode (BSC over toilet) Pt Will Perform Toileting - Clothing Manipulation and hygiene: with min assist;sit to/from stand (adhering to precautions)  OT Frequency: Min 2X/week   Barriers to D/C:            Co-evaluation              AM-PAC PT "6 Clicks" Daily Activity     Outcome Measure Help from another person eating meals?: A Little Help from another person taking care of personal grooming?: A Lot Help from another person toileting, which includes using toliet, bedpan, or urinal?: A Lot Help from another person bathing (including washing, rinsing, drying)?: A Lot Help from another person to put on and taking off regular upper body clothing?: A Little Help from another person to put on and taking off regular lower body clothing?: A Lot 6 Click Score: 14   End of Session Equipment Utilized During Treatment: Gait belt;Rolling walker (Pt does not like EVA walker)  Activity Tolerance: Patient tolerated treatment well Patient left: in chair;with call bell/phone within reach  OT Visit Diagnosis: Muscle weakness (generalized) (M62.81);Other abnormalities of gait and mobility (R26.89)                Time: 5809-9833 OT Time Calculation (min): 27 min Charges:  OT General Charges $OT Visit: 1 Visit OT Evaluation $OT Eval Moderate Complexity:  1 Mod OT Treatments $Self Care/Home Management : 8-22 mins G-Codes:     Norman Herrlich, MS OTR/L  Pager: Stanley Robles 08/25/2017, 9:45 AM

## 2017-08-25 NOTE — Progress Notes (Signed)
Subjective:   Now up on telemetry floor.  Remains in NSR.  Cr. Slightly up today but not steep climb.   Objective:  Vital Signs in the last 24 hours: BP 116/64 (BP Location: Right Arm)   Pulse 98   Temp 97.7 F (36.5 C) (Oral)   Resp 16   Ht 5\' 7"  (1.702 m)   Wt 71.6 kg (157 lb 13.6 oz)   SpO2 100%   BMI 24.72 kg/m   Physical Exam: Elderly WM in NAD, HOH Lungs:  Reduced BS Cardiac:  regular rhythm, normal S1 and S2, no S3 Extremities:  No edema present  Intake/Output from previous day: 10/01 0701 - 10/02 0700 In: 357 [P.O.:240; IV Piggyback:117] Out: 1150 [Urine:1150] Weight Filed Weights   08/23/17 0500 08/24/17 0500 08/25/17 0600  Weight: 74.3 kg (163 lb 12.8 oz) 75.2 kg (165 lb 12.6 oz) 71.6 kg (157 lb 13.6 oz)    Lab Results: Basic Metabolic Panel:  Recent Labs  08/24/17 0416 08/25/17 0334  NA 138 139  K 3.5 3.5  CL 104 103  CO2 22 22  GLUCOSE 105* 125*  BUN 89* 99*  CREATININE 5.20* 5.25*    CBC:  Recent Labs  08/23/17 1204 08/25/17 0334  WBC 7.1 7.1  HGB 9.4* 9.4*  HCT 28.4* 28.7*  MCV 100.0 100.3*  PLT 152 164    BNP    Component Value Date/Time   BNP 560.8 (H) 08/01/2017 0057    Telemetry: Currently sinus with rate 90  Assessment/Plan:  1. Recent CABG 2. ARF on top of chronic but  UOP preserved around 1L.day.  Cr. Fairly flat at present but elevated 3. Prior stroke and dysphagia 4. Atrial fibrillation currently in sinus rhythm     Looks better and awaiting to see if kidneys turn around.     Kerry Hough  MD [facility]  08/25/2017, 12:52 PM

## 2017-08-25 NOTE — Progress Notes (Signed)
[facility] Rounding Note  Subjective:   UOP about a liter/day past 3 days Creatinine about the same BP soft side Says not much appetite but no nausea or vomiting   Objective Vital signs in last 24 hours: Vitals:   08/25/17 0400 08/25/17 0500 08/25/17 0600 08/25/17 0717  BP: (!) 98/44 (!) 104/54 (!) 97/55 116/64  Pulse: (!) 109 (!) 108 (!) 105 98  Resp: 12 16 (!) 9 16  Temp: 97.6 F (36.4 C)   97.7 F (36.5 C)  TempSrc: Oral   Oral  SpO2: 100% 100% 99% 100%  Weight:   71.6 kg (157 lb 13.6 oz)   Height:       Weight change: -3.6 kg (-7 lb 15 oz)  Intake/Output Summary (Last 24 hours) at 08/25/17 1406 Last data filed at 08/25/17 0700  Gross per 24 hour  Intake              120 ml  Output              650 ml  Net             -530 ml    Physical Exam: Extremely hard of hearing  Alert Sitting in the chair by the window No JVD Lungs grossly clear, no definite crackles S1S12 No S3 HR low 100's Sternotomy scar healing Abdomen soft, non tender No LE edema whatsoever   Recent Labs Lab 08/19/17 0332  08/23/17 0343 08/24/17 0416 08/25/17 0334  NA 130*  < > 138 138 139  K 3.5  < > 3.6 3.5 3.5  CL 96*  < > 104 104 103  CO2 20*  < > 22 22 22   GLUCOSE 103*  < > 105* 105* 125*  BUN 65*  < > 85* 89* 99*  CREATININE 3.77*  < > 5.43* 5.20* 5.25*  CALCIUM 8.3*  < > 9.1 9.2 9.4  PHOS 5.6*  --   --  6.1* 5.8*  < > = values in this interval not displayed.   Recent Labs Lab 08/22/17 0612 08/23/17 0343 08/24/17 0416 08/25/17 0334  AST 24 20 18   --   ALT 31 28 25   --   ALKPHOS 77 72 65  --   BILITOT 0.7 0.8 1.1  --   PROT 5.9* 6.0* 6.2*  --   ALBUMIN 2.4* 2.4* 2.5* 2.4*    Recent Labs Lab 08/22/17 0612 08/22/17 0745 08/23/17 0343 08/23/17 1204 08/25/17 0334  WBC 2.8* 6.6 5.8 7.1 7.1  HGB 16.9 9.9* 9.3* 9.4* 9.4*  HCT 48.6 29.3* 27.6* 28.4* 28.7*  MCV 98.2 98.7 99.3 100.0 100.3*  PLT 80* 159 129* 152 164    Recent Labs Lab 08/22/17 0743 08/22/17 1138  08/22/17 1556 08/22/17 2116 08/23/17 0806  GLUCAP 117* 148* 100* 113* 121*   Iron/TIBC/Ferritin/ %Sat    Component Value Date/Time   IRON 38 (L) 08/24/2017 0416   TIBC 280 08/24/2017 0416   FERRITIN 234 08/24/2017 0416   IRONPCTSAT 14 (L) 08/24/2017 0416   Medications: Infusions: . sodium chloride      Scheduled Medications: . allopurinol  200 mg Oral Daily  . amiodarone  200 mg Oral BID  . aspirin EC  81 mg Oral Daily   Or  . aspirin  81 mg Per Tube Daily  . bisacodyl  10 mg Oral Daily   Or  . bisacodyl  10 mg Rectal Daily  . Chlorhexidine Gluconate Cloth  6 each Topical Q0600  . darbepoetin (ARANESP)  injection - NON-DIALYSIS  60 mcg Subcutaneous Q Tue-1800  . docusate sodium  200 mg Oral Daily  . feeding supplement (ENSURE ENLIVE)  237 mL Oral BID BM  . finasteride  5 mg Oral Daily  . furosemide  40 mg Oral BID  . metoprolol tartrate  12.5 mg Oral BID  . pantoprazole  40 mg Oral QHS  . rosuvastatin  10 mg Oral q1800  . sodium chloride flush  10-40 mL Intracatheter Q12H  . sodium chloride flush  3 mL Intravenous Q12H  . sorbitol  15 mL Oral Once  . warfarin  1 mg Oral q1800  . Warfarin - Physician Dosing Inpatient   Does not apply q1800   Assessment/Plan: 83 y.o. yo male with CKD3 admitted on 07/31/2017 w/angina/NSTEMI, had 2V CABG 08/07/17, AF with RVR, aspiration with ? PNA 9/18. Asked to see for AKI on CKD3 (BL creatinine mid 1's-2 /Patel).  CRRT 9/24-9/24. Temp cath out 9/30. Declines any further dialysis   1. AKI on CKD3  - baseline crt 1.5-2.  AKI 2/2 ATN after CABG. CRRT 9/20-9/24. Creatinine mid 5's. Not overly uremic. Pt told Dr. Moshe Cipro that he would not want HD under any circumstances - this was confirmed by daughter Arrie Aran. Vascath removed 9/30. Trending labs. Not sure which way this situation will go He reiterated to me today that he does not want any further dialysis. 2. Volume - weight down from what looks like a high of 89 kg pre CRRT to a nadir of  79.4 when stopped but weights all over the place. Says his home weight is around 175-180 lb. 163-165 past 2 days, today on different scale down to 157.  Probably on the dry side.  Current lasix at 40 BID po (changed from IV yesterday). Have left as is for now (prob not much effect from that dose anyway at creatine of 5) 3. Anemia- hgb was over 11 but now in the 9's - Aranesp 60 was added. TSat low at 14. Dosed with Feraheme 10/1 4. Secondary hyperparathyroidism- phos if anything low- repleted 9/24- then up to 6.1. Added low dose ca acetate 10/1 - should not require long term if renal function improves 5. Hypo K-  replete PRN 6. S/p CABG 08/07/17 Adrian Prince, MD [facility] [phone] Pager 08/25/2017, 2:06 PM

## 2017-08-25 NOTE — Progress Notes (Signed)
Nutrition Follow-up  DOCUMENTATION CODES:   Obesity unspecified  INTERVENTION:   Provide Magic cup or Mighty Shake II TID with meals to maximize oral intake:  Magic cup, each supplement provides 290 kcal and 9 grams of protein  Mighty Shake II, each supplement provides 480-500 kcals and 20-23 grams of protein  NUTRITION DIAGNOSIS:   Increased nutrient needs related to acute illness (s/p CABG 08/07/17) as evidenced by estimated needs.  Ongoing  GOAL:   Patient will meet greater than or equal to 90% of their needs  Unmet  MONITOR:   PO intake, Supplement acceptance, Labs  ASSESSMENT:   Pt with PMH of HLD, PVD, CAD, gout, BPH, GERD, TIA, CKD stage III, memory loss presented with chest pain found NSTEMI s/p CABG 08/07/17.   SLP following for dysphagia. Diet upgraded to dysphagia 1 with nectar thick liquids on 9/26. Patient is consuming 0-30% of meals. Refused lunch yesterday. He c/o poor appetite. Patient does not want to receive and further dialysis. Labs and medications reviewed. Phosphorus 5.8 (H)  Diet Order:  DIET - DYS 1 Room service appropriate? Yes; Fluid consistency: Nectar Thick  Skin:   (stage II ear, chest and leg incisions)  Last BM:  9/30  Height:   Ht Readings from Last 1 Encounters:  08/22/17 5\' 7"  (1.702 m)    Weight:   Wt Readings from Last 1 Encounters:  08/25/17 157 lb 13.6 oz (71.6 kg)    Ideal Body Weight:  67 kg  BMI:  Body mass index is 24.72 kg/m.  Estimated Nutritional Needs:   Kcal:  1800-2000  Protein:  105-115 grams  Fluid:  >/= 1.8 L/d  EDUCATION NEEDS:   Education needs no appropriate at this time  Molli Barrows, [place], [place], [place] Pager [phone] After Hours Pager [phone]

## 2017-08-25 NOTE — Care Management Important Message (Signed)
Important Message  Patient Details  Name: Stanley Robles MRN: [id number] Date of Birth: 1932/05/01   Medicare Important Message Given:  Yes    Nathen May 08/25/2017, 9:32 AM

## 2017-08-25 NOTE — Progress Notes (Signed)
Patient states he needs something for pain. Off going RN this morning stated that patient may benefit from tylenol during the daytime and hycet at bedtime.  Patient feels we are not giving him what he was getting the ICU. I explained that it was the same.  He thinks he was getting it every four hours in ICU. PA Erin aware and will address with Dr. Prescott Gum. Patient  Says "whatever".  Pt knows we are trying to address. Payton Emerald, RN

## 2017-08-25 NOTE — Progress Notes (Signed)
Patient transferred to 4E03, transferred via W/C and monitor, daughter Shauna Hugh called to let her know.

## 2017-08-26 ENCOUNTER — Inpatient Hospital Stay (HOSPITAL_COMMUNITY): Payer: Medicare Other

## 2017-08-26 DIAGNOSIS — R5381 Other malaise: Secondary | ICD-10-CM

## 2017-08-26 LAB — CBC
HEMATOCRIT: 27 % — AB (ref 39.0–52.0)
Hemoglobin: 8.9 g/dL — ABNORMAL LOW (ref 13.0–17.0)
MCH: 33.3 pg (ref 26.0–34.0)
MCHC: 33 g/dL (ref 30.0–36.0)
MCV: 101.1 fL — ABNORMAL HIGH (ref 78.0–100.0)
Platelets: 160 10*3/uL (ref 150–400)
RBC: 2.67 MIL/uL — ABNORMAL LOW (ref 4.22–5.81)
RDW: 16.9 % — AB (ref 11.5–15.5)
WBC: 6.5 10*3/uL (ref 4.0–10.5)

## 2017-08-26 LAB — PROTIME-INR
INR: 3.27
Prothrombin Time: 33.1 seconds — ABNORMAL HIGH (ref 11.4–15.2)

## 2017-08-26 LAB — RENAL FUNCTION PANEL
ALBUMIN: 2.4 g/dL — AB (ref 3.5–5.0)
Anion gap: 15 (ref 5–15)
BUN: 100 mg/dL — AB (ref 6–20)
CALCIUM: 9.5 mg/dL (ref 8.9–10.3)
CO2: 22 mmol/L (ref 22–32)
Chloride: 101 mmol/L (ref 101–111)
Creatinine, Ser: 5.17 mg/dL — ABNORMAL HIGH (ref 0.61–1.24)
GFR calc Af Amer: 11 mL/min — ABNORMAL LOW (ref >60)
GFR, EST NON AFRICAN AMERICAN: 9 mL/min — AB (ref >60)
GLUCOSE: 107 mg/dL — AB (ref 65–99)
PHOSPHORUS: 5.3 mg/dL — AB (ref 2.5–4.6)
POTASSIUM: 3.6 mmol/L (ref 3.5–5.1)
SODIUM: 138 mmol/L (ref 135–145)

## 2017-08-26 MED ORDER — ACETAMINOPHEN 325 MG PO TABS
650.0000 mg | ORAL_TABLET | Freq: Four times a day (QID) | ORAL | Status: DC | PRN
  Administered 2017-08-26 – 2017-08-27 (×2): 650 mg via ORAL
  Filled 2017-08-26 (×2): qty 2

## 2017-08-26 MED ORDER — ORAL CARE MOUTH RINSE
15.0000 mL | Freq: Two times a day (BID) | OROMUCOSAL | Status: DC
  Administered 2017-08-27 – 2017-08-28 (×4): 15 mL via OROMUCOSAL

## 2017-08-26 MED ORDER — CHLORHEXIDINE GLUCONATE 0.12 % MT SOLN
15.0000 mL | Freq: Two times a day (BID) | OROMUCOSAL | Status: DC
  Administered 2017-08-26 – 2017-08-28 (×4): 15 mL via OROMUCOSAL
  Filled 2017-08-26 (×3): qty 15

## 2017-08-26 MED ORDER — SODIUM CHLORIDE 0.9 % IV SOLN
INTRAVENOUS | Status: AC
  Administered 2017-08-26 – 2017-08-28 (×3): via INTRAVENOUS

## 2017-08-26 NOTE — Progress Notes (Signed)
  Speech Language Pathology Treatment: Dysphagia  Patient Details Name: Artavis Cowie MRN: [id number] DOB: Nov 18, 1932 Today's Date: 08/26/2017 Time: 6468-0321 SLP Time Calculation (min) (ACUTE ONLY): 20 min  Assessment / Plan / Recommendation Clinical Impression  Pt up in chair, reporting buttock pain. Educated/explained pt has been progressing toward his dysphagia goals and SLP, attempting to provide encouragement as he is struggling with this illness, "I hate being sick.". Does not like the thick liquids. Oral decontamination/oral infection control care provided removing large mucous debris from glossopalatine arch. Diagnostic treatment provided with thin liquids with immediate reflexive cough. Pt stated he consumed a big sip. Subsequent smaller cup sips did not result in observable/audible s/s aspiration. Recommend repeating MBS for possible upgrade to thin liquids. MBS will be scheduled for tomorrow.    HPI HPI: Pt is an 83 yo male admitted with NSTEMI s/p cath which showed severe left main disease and total right coronary artery stenosis, potential CABG planned for 9/14. 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 Was evaluated by ST this admission prior to CABG; MBS 08/05/17 revealed mild oropharyngeal and cervical esophageal dysphagia, suspected to be more chronic in nature given his h/o dysphagia from prior CVA and h/o GERD. Dys 3, thin liquids, no straws recommended with ST to follow briefly through surgery given his history. Pt was intubated for CABG on 9/14, transported back to floor on vent and subsequent extubated at 10pm same day. Pt was consuming Dys 2, nectar > developed ileus > NPO and diagnostic reassessment this morning.        SLP Plan  MBS       Recommendations  Diet recommendations: Dysphagia 2 (fine chop);Nectar-thick liquid Liquids provided via: Cup;No straw Medication  Administration: Crushed with puree Supervision: Patient able to self feed;Intermittent supervision to cue for compensatory strategies Compensations: Slow rate;Small sips/bites Postural Changes and/or Swallow Maneuvers: Seated upright 90 degrees                Oral Care Recommendations: Oral care BID Follow up Recommendations: Skilled Nursing facility SLP Visit Diagnosis: Dysphagia, unspecified (R13.10) Plan: MBS       GO                Houston Siren 08/26/2017, 10:57 AM [name] [name] [employer] [phone]

## 2017-08-26 NOTE — Progress Notes (Addendum)
Physical Therapy Treatment Patient Details Name: Stanley Robles MRN: [id number] DOB: [birth year] Today's Date: 08/26/2017    History of Present Illness 83 yo admitted with NSTEMI s/p cath which showed severe left main disease and total right coronary artery stenosis, s/p CABG 08/08/17. experienced acute renal failure Placed on CRRT 08/12/17 removed from CRRT 08/17/17 PMhx:  CVA, sensorineural hearing loss, HLD, PVD, pAF, CKD stage 3, HTN, NSTEMI, chronic diastolic heart failure    PT Comments    Pt limited by nausea and dizziness with treatment session today. Pt is modA for bed mobility, and minAx2 for transfers. Pt goals revised today to current mobility status. Pt requires skilled PT to progress mobility and improve strength and endurance to safely navigate their discharge environment.    Follow Up Recommendations  SNF     Equipment Recommendations  Other (comment)       Precautions / Restrictions Precautions Precautions: Fall;Sternal Restrictions Weight Bearing Restrictions: Yes (sternal precautions)    Mobility  Bed Mobility Overal bed mobility: Needs Assistance         Sit to supine: Mod assist   General bed mobility comments: modA for LE management into bed  Transfers Overall transfer level: Needs assistance Equipment used: Rolling walker (2 wheeled) Transfers: Sit to/from [employer] Sit to Stand: Min assist;+2 physical assistance Stand pivot transfers: Min assist;+2 physical assistance       General transfer comment: minAx2 for power up, vc for use of momentum with ascent       Balance Overall balance assessment: Needs assistance Sitting-balance support: Feet supported;Bilateral upper extremity supported Sitting balance-Leahy Scale: Fair     Standing balance support: Bilateral upper extremity supported Standing balance-Leahy Scale: Poor Standing balance comment: Mod assist for                             Cognition  Arousal/Alertness: Awake/alert Behavior During Therapy: WFL for tasks assessed/performed Overall Cognitive Status: Within Functional Limits for tasks assessed                                           General Comments General comments (skin integrity, edema, etc.): Pt feeling nauseated at beginning of treatment session, however pt willing to try to walk. initial BP 120/53, HR 88, SaO2 on RA 100%, with standing pt felt more nauseous, with c/o dizziness, BP 138/61 after standing, pt attempted 1 more time sit>stand and sat back down, stating he wanted to get in bed, minAx2 for stand pivot transfer to bed      Pertinent Vitals/Pain Pain Assessment: 0-10 Pain Score: 8  Faces Pain Scale: Hurts whole lot Pain Location: buttocks, stomach Pain Descriptors / Indicators: Constant;Aching Pain Intervention(s): Monitored during session;Limited activity within patient's tolerance    Home Living Family/patient expects to be discharged to:: Private residence Living Arrangements: Alone Available Help at Discharge: Family;Available 24 hours/day Type of Home: House Home Access: Stairs to enter   Home Layout: One level Home Equipment: Cane - single point;Shower seat - built in;Wheelchair - manual      Prior Function Level of Independence: Needs assistance  Gait / Transfers Assistance Needed: pt walks with a cane ADL's / Homemaking Assistance Needed: daughters assist with IADL     PT Goals (current goals can now be found in the care plan section) Acute Rehab PT Goals Patient Stated  Goal: return home after surgery PT Goal Formulation: With patient Potential to Achieve Goals: Fair Progress towards PT goals: PT to reassess next treatment    Frequency    Min 3X/week      PT Plan Current plan remains appropriate       AM-PAC PT "6 Clicks" Daily Activity  Outcome Measure  Difficulty turning over in bed (including adjusting bedclothes, sheets and blankets)?:  Unable Difficulty moving from lying on back to sitting on the side of the bed? : Unable Difficulty sitting down on and standing up from a chair with arms (e.g., wheelchair, bedside commode, etc,.)?: Unable Help needed moving to and from a bed to chair (including a wheelchair)?: A Lot Help needed walking in hospital room?: A Lot Help needed climbing 3-5 steps with a railing? : Total 6 Click Score: 8    End of Session Equipment Utilized During Treatment: Gait belt Activity Tolerance:  (limited by nausea) Patient left: in bed;with call bell/phone within reach;with bed alarm set Nurse Communication: Mobility status;Other (comment) (Nausea) PT Visit Diagnosis: Unsteadiness on feet (R26.81);Other abnormalities of gait and mobility (R26.89);Muscle weakness (generalized) (M62.81);Difficulty in walking, not elsewhere classified (R26.2);Dizziness and giddiness (R42)     Time: 2122-4825 PT Time Calculation (min) (ACUTE ONLY): 17 min  Charges:  $Therapeutic Activity: 8-22 mins                    G Codes:       Dhwani Venkatesh B. Migdalia Dk PT, DPT Acute Rehabilitation  [phone] Pager [phone]     [place] 08/26/2017, 1:59 PM

## 2017-08-26 NOTE — Progress Notes (Signed)
Subjective:   Complains of pain in his buttocks.  Not short of breath.  He mentioned that he had some chest discomfort last night but could not elaborate further on it.  His creatinine is a little less today than it has been.  Objective:  Vital Signs in the last 24 hours: BP (!) 124/47   Pulse 91   Temp 97.8 F (36.6 C) (Oral)   Resp 20   Ht 5\' 7"  (1.702 m)   Wt 72.6 kg (160 lb)   SpO2 95%   BMI 25.06 kg/m   Physical Exam: Elderly WM in NAD, HOH Lungs:  Reduced BS Cardiac:  regular rhythm, normal S1 and S2, no S3 Extremities:  No edema present  Intake/Output from previous day: 10/02 0701 - 10/03 0700 In: -  Out: 625 [Urine:625] Weight Filed Weights   08/24/17 0500 08/25/17 0600 08/26/17 0400  Weight: 75.2 kg (165 lb 12.6 oz) 71.6 kg (157 lb 13.6 oz) 72.6 kg (160 lb)    Lab Results: Basic Metabolic Panel:  Recent Labs  08/25/17 0334 08/26/17 0504  NA 139 138  K 3.5 3.6  CL 103 101  CO2 22 22  GLUCOSE 125* 107*  BUN 99* 100*  CREATININE 5.25* 5.17*    CBC:  Recent Labs  08/25/17 0334 08/26/17 0504  WBC 7.1 6.5  HGB 9.4* 8.9*  HCT 28.7* 27.0*  MCV 100.3* 101.1*  PLT 164 160    BNP    Component Value Date/Time   BNP 560.8 (H) 08/01/2017 0057    Telemetry: Currently sinus with rate 80  Assessment/Plan:  1. Recent CABG 2. ARF on top of chronic but  UOP preserved around 1L.day.  Cr. Fairly flat at present but elevated 3. Prior stroke and dysphagia 4. Atrial fibrillation currently in sinus rhythm   5.  Chest discomfort last evening unclear cause   With chest pain last night check EKG today.  Continue awaiting to see if kidneys turn around.     Kerry Hough  MD [facility]  08/26/2017, 11:57 AM

## 2017-08-26 NOTE — Discharge Instructions (Signed)
We ask the SNF to please do the following: 1. Please obtain vital signs at least one time daily 2.Please weigh the patient daily. If he or she continues to gain weight or develops lower extremity edema, contact the office at [phone]. 3. Ambulate patient at least three times daily and please use sternal precautions.  Coronary Artery Bypass Grafting, Care After  This sheet gives you information about how to care for yourself after your procedure. Your health care provider may also give you more specific instructions. If you have problems or questions, contact your health care provider. What can I expect after the procedure? After the procedure, it is common to have:  Nausea and a lack of appetite.  Constipation.  Weakness and fatigue.  Depression or irritability.  Pain or discomfort in your incision areas.  Follow these instructions at home: Medicines  Take over-the-counter and prescription medicines only as told by your health care provider. Do not stop taking medicines or start any new medicines without approval from your health care provider.  If you were prescribed an antibiotic medicine, take it as told by your health care provider. Do not stop taking the antibiotic even if you start to feel better.  Do not drive or use heavy machinery while taking prescription pain medicine. Incision care  Follow instructions from your health care provider about how to take care of your incisions. Make sure you: ? Wash your hands with soap and water before you change your bandage (dressing). If soap and water are not available, use hand sanitizer. ? Change your dressing as told by your health care provider. ? Leave stitches (sutures), skin glue, or adhesive strips in place. These skin closures may need to stay in place for 2 weeks or longer. If adhesive strip edges start to loosen and curl up, you may trim the loose edges. Do not remove adhesive strips completely unless your health care  provider tells you to do that.  Keep incision areas clean, dry, and protected.  Check your incision areas every day for signs of infection. Check for: ? More redness, swelling, or pain. ? More fluid or blood. ? Warmth. ? Pus or a bad smell.  If incisions were made in your legs: ? Avoid crossing your legs. ? Avoid sitting for long periods of time. Change positions every 30 minutes. ? Raise (elevate) your legs when you are sitting. Bathing  Do not take baths, swim, or use a hot tub until your health care provider approves.  Only take sponge baths. Pat the incisions dry. Do not rub incisions with a washcloth or towel.  Ask your health care provider when you can shower. Eating and drinking  Eat foods that are high in fiber, such as raw fruits and vegetables, whole grains, beans, and nuts. Meats should be lean cut. Avoid canned, processed, and fried foods. This can help prevent constipation and is a recommended part of a heart-healthy diet.  Drink enough fluid to keep your urine clear or pale yellow.  Limit alcohol intake to no more than 1 drink a day for nonpregnant women and 2 drinks a day for men. One drink equals 12 oz of beer, 5 oz of wine, or 1 oz of hard liquor. Activity  Rest and limit your activity as told by your health care provider. You may be instructed to: ? Stop any activity right away if you have chest pain, shortness of breath, irregular heartbeats, or dizziness. Get help right away if you have any of  these symptoms. ? Move around frequently for short periods or take short walks as directed by your health care provider. Gradually increase your activities. You may need physical therapy or cardiac rehabilitation to help strengthen your muscles and build your endurance. ? Avoid lifting, pushing, or pulling anything that is heavier than 10 lb (4.5 kg) for at least 6 weeks or as told by your health care provider.  Do not drive until your health care provider  approves.  Ask your health care provider when you may return to work.  Ask your health care provider when you may resume sexual activity. General instructions  Do not use any products that contain nicotine or tobacco, such as cigarettes and e-cigarettes. If you need help quitting, ask your health care provider.  Take 2-3 deep breaths every few hours during the day, while you recover. This helps expand your lungs and prevent complications like pneumonia after surgery.  If you were given a device called an incentive spirometer, use it several times a day to practice deep breathing. Support your chest with a pillow or your arms when you take deep breaths or cough.  Wear compression stockings as told by your health care provider. These stockings help to prevent blood clots and reduce swelling in your legs.  Weigh yourself every day. This helps identify if your body is holding (retaining) fluid that may make your heart and lungs work harder.  Keep all follow-up visits as told by your health care provider. This is important. Contact a health care provider if:  You have more redness, swelling, or pain around any incision.  You have more fluid or blood coming from any incision.  Any incision feels warm to the touch.  You have pus or a bad smell coming from any incision  You have a fever.  You have swelling in your ankles or legs.  You have pain in your legs.  You gain 2 lb (0.9 kg) or more a day.  You are nauseous or you vomit.  You have diarrhea. Get help right away if:  You have chest pain that spreads to your jaw or arms.  You are short of breath.  You have a fast or irregular heartbeat.  You notice a "clicking" in your breastbone (sternum) when you move.  You have numbness or weakness in your arms or legs.  You feel dizzy or light-headed. Summary  After the procedure, it is common to have pain or discomfort in the incision areas.  Do not take baths, swim, or use a  hot tub until your health care provider approves.  Gradually increase your activities. You may need physical therapy or cardiac rehabilitation to help strengthen your muscles and build your endurance.  Weigh yourself every day. This helps identify if your body is holding (retaining) fluid that may make your heart and lungs work harder. This information is not intended to replace advice given to you by your health care provider. Make sure you discuss any questions you have with your health care provider. Document Released: 05/30/2005 Document Revised: 09/29/2016 Document Reviewed: 09/29/2016 Elsevier Interactive Patient Education  2018 [place] on my medicine - Coumadin   (Warfarin)  Why was Coumadin prescribed for you? Coumadin was prescribed for you because you have a blood clot or a medical condition that can cause an increased risk of forming blood clots. Blood clots can cause serious health problems by blocking the flow of blood to the heart, lung, or brain. Coumadin can  prevent harmful blood clots from forming. As a reminder your indication for Coumadin is:   Stroke Prevention Because Of Atrial Fibrillation  What test will check on my response to Coumadin? While on Coumadin (warfarin) you will need to have an INR test regularly to ensure that your dose is keeping you in the desired range. The INR (international normalized ratio) number is calculated from the result of the laboratory test called prothrombin time (PT).  If an INR APPOINTMENT HAS NOT ALREADY BEEN MADE FOR YOU please schedule an appointment to have this lab work done by your health care provider within 7 days. Your INR goal is usually a number between:  2 to 3 or your provider may give you a more narrow range like 2-2.5.  Ask your health care provider during an office visit what your goal INR is.  What  do you need to  know  About  COUMADIN? Take Coumadin (warfarin) exactly as prescribed by your  healthcare provider about the same time each day.  DO NOT stop taking without talking to the doctor who prescribed the medication.  Stopping without other blood clot prevention medication to take the place of Coumadin may increase your risk of developing a new clot or stroke.  Get refills before you run out.  What do you do if you miss a dose? If you miss a dose, take it as soon as you remember on the same day then continue your regularly scheduled regimen the next day.  Do not take two doses of Coumadin at the same time.  Important Safety Information A possible side effect of Coumadin (Warfarin) is an increased risk of bleeding. You should call your healthcare provider right away if you experience any of the following: ? Bleeding from an injury or your nose that does not stop. ? Unusual colored urine (red or dark brown) or unusual colored stools (red or black). ? Unusual bruising for unknown reasons. ? A serious fall or if you hit your head (even if there is no bleeding).  Some foods or medicines interact with Coumadin (warfarin) and might alter your response to warfarin. To help avoid this: ? Eat a balanced diet, maintaining a consistent amount of Vitamin K. ? Notify your provider about major diet changes you plan to make. ? Avoid alcohol or limit your intake to 1 drink for women and 2 drinks for men per day. (1 drink is 5 oz. wine, 12 oz. beer, or 1.5 oz. liquor.)  Make sure that ANY health care provider who prescribes medication for you knows that you are taking Coumadin (warfarin).  Also make sure the healthcare provider who is monitoring your Coumadin knows when you have started a new medication including herbals and non-prescription products.  Coumadin (Warfarin)  Major Drug Interactions  Increased Warfarin Effect Decreased Warfarin Effect  Alcohol (large quantities) Antibiotics (esp. Septra/Bactrim, Flagyl, Cipro) Amiodarone (Cordarone) Aspirin (ASA) Cimetidine  (Tagamet) Megestrol (Megace) NSAIDs (ibuprofen, naproxen, etc.) Piroxicam (Feldene) Propafenone (Rythmol SR) Propranolol (Inderal) Isoniazid (INH) Posaconazole (Noxafil) Barbiturates (Phenobarbital) Carbamazepine (Tegretol) Chlordiazepoxide (Librium) Cholestyramine (Questran) Griseofulvin Oral Contraceptives Rifampin Sucralfate (Carafate) Vitamin K   Coumadin (Warfarin) Major Herbal Interactions  Increased Warfarin Effect Decreased Warfarin Effect  Garlic Ginseng Ginkgo biloba Coenzyme Q10 Green tea St. Johns wort    Coumadin (Warfarin) FOOD Interactions  Eat a consistent number of servings per week of foods HIGH in Vitamin K (1 serving =  cup)  Collards (cooked, or boiled & drained) Kale (cooked, or boiled & drained) Mustard  greens (cooked, or boiled & drained) Parsley *serving size only =  cup Spinach (cooked, or boiled & drained) Swiss chard (cooked, or boiled & drained) Turnip greens (cooked, or boiled & drained)  Eat a consistent number of servings per week of foods MEDIUM-HIGH in Vitamin K (1 serving = 1 cup)  Asparagus (cooked, or boiled & drained) Broccoli (cooked, boiled & drained, or raw & chopped) Brussel sprouts (cooked, or boiled & drained) *serving size only =  cup Lettuce, raw (green leaf, endive, romaine) Spinach, raw Turnip greens, raw & chopped   These websites have more information on Coumadin (warfarin):  FailFactory.se; VeganReport.com.au;

## 2017-08-26 NOTE — Progress Notes (Addendum)
      [address]       [place] [id number]             [phone]        19 Days Post-Op Procedure(s) (LRB): CORONARY ARTERY BYPASS GRAFTING (CABG) x 2 WITH ENDOSCOPIC HARVESTING OF RIGHT SAPHENOUS VEIN. (N/A) TRANSESOPHAGEAL ECHOCARDIOGRAM (TEE) (N/A)  Subjective: Patient states not much appetite, but denies nausea or vomiting.  Objective: Vital signs in last 24 hours: Temp:  [96.8 F (36 C)-98.5 F (36.9 C)] 97.7 F (36.5 C) (10/03 0400) Pulse Rate:  [80-83] 82 (10/03 0400) Cardiac Rhythm: Atrial fibrillation (10/02 2000) Resp:  [0-18] 0 (10/03 0400) BP: (99-130)/(41-62) 123/41 (10/03 0400) SpO2:  [95 %-96 %] 96 % (10/03 0400) Weight:  [160 lb (72.6 kg)] 160 lb (72.6 kg) (10/03 0400)  Pre op weight 78.7 kg Current Weight  08/26/17 160 lb (72.6 kg)      Intake/Output from previous day: 10/02 0701 - 10/03 0700 In: -  Out: 625 [Urine:625]   Physical Exam:  Cardiovascular: RRR Pulmonary: Clear to auscultation bilaterally Abdomen: Soft, non tender, bowel sounds present. Extremities: No lower extremity edema. Wounds: Clean and dry.  No erythema or signs of infection.  Lab Results: CBC: Recent Labs  08/25/17 0334 08/26/17 0504  WBC 7.1 6.5  HGB 9.4* 8.9*  HCT 28.7* 27.0*  PLT 164 160   BMET:  Recent Labs  08/25/17 0334 08/26/17 0504  NA 139 138  K 3.5 3.6  CL 103 101  CO2 22 22  GLUCOSE 125* 107*  BUN 99* 100*  CREATININE 5.25* 5.17*  CALCIUM 9.4 9.5    PT/INR:  Lab Results  Component Value Date   INR 3.27 08/26/2017   INR 2.72 08/25/2017   INR 2.43 08/24/2017   ABG:  INR: Will add last result for INR, ABG once components are confirmed Will add last 4 CBG results once components are confirmed  Assessment/Plan:  1. CV - Previous a fib. SR in the 80's.On Lopressor 12.5 mg bid, Amiodarone 200 mg bid, and Coumadin. INR 3.27 this am. No Coumadin again tonight. 2.  Pulmonary - On room air. CXR today showed no  pneumothorax, persistent left lobe consolidation (likely atelectasis).Encourage incentive spirometer and flutter valve. 3. AKI on CKD (stage III)-creatinine 5.17 this am . Baseline creatinine 1.5-2. Has had CRRT previously. Patient does not wish to have further HD. 4.  Anemia of chronic disease - H and H decreased to 8.9 and 27. On Aranesp. 5. Will remove EPW when INR decreased  6. Volume overload-on Lasix 40 mg bid. Nephrology managing secondary to AKI on CKD 7. 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 Tharon Aquas Trigt III 08/26/2017

## 2017-08-26 NOTE — Consult Note (Signed)
Physical Medicine and Rehabilitation Consult Reason for Consult:Decreased functional mobility Referring Physician: Dr. Nils Pyle   HPI: Stanley Robles is a 83 y.o.right handed male with history of CKD stage III,PAF maintained on Eliquis,right pontine CVA 2016 and was discharged to a skilled nursing facility at that time for his ongoingcare, chronic diastolic congestive heart failure CAD/NSTEMI.  Per chart review patient currently lives alone 1 level home with 3 steps to entry. He uses a cane to help aid in his mobility. Daughters assist with basic ADLs when needed.presented 08/01/2007 2 with nonspecific chest pain and positive cardiac enzymes.  Echocardiogram with ejection fraction 70% no wall motion abnormalities.Cardiac catheterization showed severe ostial stenosis left circumflex 95% ostial lesion. Severe left main stenosis. Underwent CABG 2 08/08/2017 per Dr. Nils Pyle. Postoperative with progressive acute on chronic renal with nephrology consulted 08/12/2017. Underwent placement of hemodialysis catheter for acute postoperative renal failure. Patient initially with hemodialysis since discontinued it patient and family's wishes and temporary catheter removed. Dysphagia #1 nectar thick liquid diet. Therapies initiated with slow gains. Patient remains on sternal precautions. M.D. Has requested physical medicine rehabilitation consult for consideration of CIR versus need for SNF.     Review of Systems  Constitutional: Negative for chills and fever.  HENT: Positive for hearing loss.   Eyes: Negative for blurred vision and double vision.  Cardiovascular: Positive for chest pain, palpitations and leg swelling.  Gastrointestinal:       GERD  Genitourinary: Positive for urgency.  Musculoskeletal: Positive for joint pain and myalgias.  Skin: Negative for rash.  Neurological: Positive for dizziness and weakness.  Psychiatric/Behavioral: Positive for memory loss.  All other systems reviewed  and are negative.  Past Medical History:  Diagnosis Date  . BPH (benign prostatic hypertrophy)   . 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 Chronic kidney disease stage 3   . Coronary artery disease    DR. TILLEY IS PT'S CARDIOLGIST  . GERD (gastroesophageal reflux disease)   . Gout   . Hyperlipidemia   . Hypertensive heart disease   . Incontinence of urine   . Memory loss   . Myocardial infarction ([place]) 1994  . Osteoarthritis   . Paroxysmal atrial fibrillation ([facility])   . Peripheral vascular disease ([place])   . Personal history of transient ischemic attack (TIA) and cerebral infarction without residual deficit   . Right pontine stroke ([place]) 02/27/2015  . Tooth disease    Past Surgical History:  Procedure Laterality Date  . CARDIAC CATHETERIZATION  07/21/2005  . CORONARY ARTERY BYPASS GRAFT N/A 08/07/2017   Procedure: CORONARY ARTERY BYPASS GRAFTING (CABG) x 2 WITH ENDOSCOPIC HARVESTING OF RIGHT SAPHENOUS VEIN.;  Surgeon: Ivin Poot, MD;  Location: [facility];  Service: Open Heart Surgery;  Laterality: N/A;  . HERNIA REPAIR  12/26/2002   BILATERAL INGUINAL HERNIA REPAIR  . LEFT HEART CATH AND CORONARY ANGIOGRAPHY N/A 08/03/2017   Procedure: LEFT HEART CATH AND CORONARY ANGIOGRAPHY;  Surgeon: Sherren Mocha, MD;  Location: [facility];  Service: Cardiovascular;  Laterality: N/A;  . MASTOID SURGERY X 5    . PROSTATE SURGERY    . TEE WITHOUT CARDIOVERSION N/A 08/07/2017   Procedure: TRANSESOPHAGEAL ECHOCARDIOGRAM (TEE);  Surgeon: Prescott Gum, Collier Salina, MD;  Location: [facility];  Service: Open Heart Surgery;  Laterality: N/A;  . TOTAL KNEE ARTHROPLASTY Left 03/28/2013   Procedure: LEFT TOTAL KNEE ARTHROPLASTY;  Surgeon: Gearlean Alf, MD;  Location: [facility];  Service: Orthopedics;  Laterality: Left;   Family History  Problem Relation Age of Onset  . Hypertension Father   . Hypertension Mother   . Hyperlipidemia Mother   . Cancer Brother    Social History:   reports that he has quit smoking. He has never used smokeless tobacco. He reports that he does not drink alcohol or use drugs. Allergies:  Allergies  Allergen Reactions  . Lipitor [Atorvastatin] Other (See Comments)    Severe constipation   Medications Prior to Admission  Medication Sig Dispense Refill  . acetaminophen (TYLENOL) 500 MG tablet Take 1,000 mg by mouth every 6 (six) hours as needed for pain.    Marland Kitchen allopurinol (ZYLOPRIM) 300 MG tablet 300 mg daily.    Marland Kitchen apixaban (ELIQUIS) 2.5 MG TABS tablet Take 1 tablet (2.5 mg total) by mouth 2 (two) times daily. 60 tablet   . atenolol (TENORMIN) 25 MG tablet Take 1 tablet (25 mg total) by mouth 2 (two) times daily. (Patient taking differently: Take 25 mg by mouth daily. )    . Cholecalciferol (VITAMIN D3) 2000 units TABS Take 2,000 Units by mouth daily.    Marland Kitchen docusate sodium (COLACE) 100 MG capsule Take 100 mg by mouth daily.    . finasteride (PROSCAR) 5 MG tablet Take 5 mg by mouth daily.    Marland Kitchen lisinopril (PRINIVIL,ZESTRIL) 20 MG tablet 20 mg daily.    Marland Kitchen MYRBETRIQ 50 MG TB24 tablet Take 50 mg by mouth daily.    . nitroGLYCERIN (NITROSTAT) 0.4 MG SL tablet Place 0.4 mg under the tongue every 5 (five) minutes as needed for chest pain.    Marland Kitchen omeprazole (PRILOSEC) 20 MG capsule Take 20 mg by mouth daily.    . rosuvastatin (CRESTOR) 40 MG tablet 40 mg daily.      Home: Home Living Family/patient expects to be discharged to:: Private residence Living Arrangements: Alone Available Help at Discharge: Family, Available 24 hours/day Type of Home: House Home Access: Stairs to enter Technical brewer of Steps: 3 Home Layout: One level Bathroom Shower/Tub: Multimedia programmer: [place]: Radio producer - single point, Civil engineer, contracting - built in, Wheelchair - manual  Functional History: Prior Function Level of Independence: Needs assistance Gait / Transfers Assistance Needed: pt walks with a cane ADL's / Homemaking Assistance  Needed: daughters assist with IADL Functional Status:  Mobility: Bed Mobility Overal bed mobility: Needs Assistance Bed Mobility: Sit to Supine Supine to sit: Max assist Sit to supine: Max assist, +2 for physical assistance General bed mobility comments: OOB in chair on OT arrival Transfers Overall transfer level: Needs assistance Equipment used: Rolling walker (2 wheeled) Transfers: Sit to/from Stand, [employer] Transfers Sit to Stand: Mod assist Stand pivot transfers: Min assist, +2 physical assistance General transfer comment: Mod lifting assist. Able to complete multiple times. Ambulation/Gait Ambulation/Gait assistance: Mod assist Ambulation Distance (Feet): 150 Feet Assistive device:  (EVA walker) Gait Pattern/deviations: Step-to pattern, Shuffle, Narrow base of support General Gait Details: ModA for steadying with EVA walker, vc for upright posture and increased base of support Gait velocity: slowed Gait velocity interpretation: Below normal speed for age/gender Stairs: Yes Stairs assistance: Min assist Stair Management: Forwards, No rails, One rail Right, Step to pattern Number of Stairs: 3 General stair comments: to ascend hand hold assist and no rail, pt used both rail and hand hold to descend    ADL: ADL Overall ADL's : Needs assistance/impaired Eating/Feeding: Supervision/ safety, Sitting Eating/Feeding Details (indicate cue  type and reason): Decreased endurance and limited by tremors.  Grooming: Moderate assistance, Standing Upper Body Bathing: Minimal assistance, Sitting Lower Body Bathing: Moderate assistance, Sit to/from stand Upper Body Dressing : Minimal assistance, Sitting Lower Body Dressing: Moderate assistance, Sit to/from stand Toilet Transfer: Moderate assistance Toilet Transfer Details (indicate cue type and reason): Mod assist for sit<>stand in preparation for toilet transfer. Fatigues easily. Toileting- Clothing Manipulation and Hygiene: Maximal  assistance, Sit to/from stand General ADL Comments: Pt limited by decreased activity tolerance for ADL. Good demonstration of adherence to sternal precautions.   Cognition: Cognition Overall Cognitive Status: Within Functional Limits for tasks assessed Orientation Level: Oriented X4 Cognition Arousal/Alertness: Awake/alert Behavior During Therapy: WFL for tasks assessed/performed Overall Cognitive Status: Within Functional Limits for tasks assessed  Blood pressure (!) 123/41, pulse 82, temperature 97.7 F (36.5 C), temperature source Axillary, resp. rate (!) 0, height 5\' 7"  (1.702 m), weight 72.6 kg (160 lb), SpO2 96 %. Physical Exam  HENT:  Head: Normocephalic.  Eyes: EOM are normal.  Neck: Normal range of motion. Neck supple. No thyromegaly present.  Cardiovascular:  Cardiac rate control  Respiratory:  Decreased breath sounds but clear to auscultation  GI: Soft. Bowel sounds are normal. He exhibits no distension.  Neurological: He is alert.  Patient is hard of hearing. Follows simple commands. He can provide his name and age. Limited medical historian.  Skin: Skin is warm and dry.  Motor strength is 5/5 bilateral deltoid, bicep, tricep, grip 4 minus bilateral hip flexor, knee extensor, ankle dorsiflexor Sensation difficult to assess secondary to his hearing impairment, but he does correctly identify which fingers and toes are touched bilaterally  Results for orders placed or performed during the hospital encounter of 07/31/17 (from the past 24 hour(s))  Protime-INR     Status: Abnormal   Collection Time: 08/26/17  5:04 AM  Result Value Ref Range   Prothrombin Time 33.1 (H) 11.4 - 15.2 seconds   INR 3.27   Renal function panel     Status: Abnormal   Collection Time: 08/26/17  5:04 AM  Result Value Ref Range   Sodium 138 135 - 145 mmol/L   Potassium 3.6 3.5 - 5.1 mmol/L   Chloride 101 101 - 111 mmol/L   CO2 22 22 - 32 mmol/L   Glucose, Bld 107 (H) 65 - 99 mg/dL   BUN 100  (H) 6 - 20 mg/dL   Creatinine, Ser 5.17 (H) 0.61 - 1.24 mg/dL   Calcium 9.5 8.9 - 10.3 mg/dL   Phosphorus 5.3 (H) 2.5 - 4.6 mg/dL   Albumin 2.4 (L) 3.5 - 5.0 g/dL   GFR calc non Af Amer 9 (L) >60 mL/min   GFR calc Af Amer 11 (L) >60 mL/min   Anion gap 15 5 - 15  CBC     Status: Abnormal   Collection Time: 08/26/17  5:04 AM  Result Value Ref Range   WBC 6.5 4.0 - 10.5 K/uL   RBC 2.67 (L) 4.22 - 5.81 MIL/uL   Hemoglobin 8.9 (L) 13.0 - 17.0 g/dL   HCT 27.0 (L) 39.0 - 52.0 %   MCV 101.1 (H) 78.0 - 100.0 fL   MCH 33.3 26.0 - 34.0 pg   MCHC 33.0 30.0 - 36.0 g/dL   RDW 16.9 (H) 11.5 - 15.5 %   Platelets 160 150 - 400 K/uL   No results found.  Assessment/Plan: Diagnosis: Deconditioning after coronary artery bypass grafting 1. Does the need for close, 24 hr/day medical supervision  in concert with the patient's rehab needs make it unreasonable for this patient to be served in a less intensive setting? No 2. Co-Morbidities requiring supervision/potential complications: Dysphagia, chronic kidney disease, paroxysmal atrial fibrillation 3. Due to bladder management, bowel management, safety, skin/wound care, disease management, medication administration, pain management and patient education, does the patient require 24 hr/day rehab nursing? Potentially 4. Does the patient require coordinated care of a physician, rehab nurse, PT, OT, speech to address physical and functional deficits in the context of the above medical diagnosis(es)? No Addressing deficits in the following areas: balance, endurance, locomotion, strength, transferring, bathing, dressing, toileting and swallowing 5. Can the patient actively participate in an intensive therapy program of at least 3 hrs of therapy per day at least 5 days per week? No 6. The potential for patient to make measurable gains while on inpatient rehab is Poor prognosis to achieve functional independence 7. Anticipated functional outcomes upon discharge from  inpatient rehab are n/a  with PT, n/a with OT, n/a with SLP. 8. Estimated rehab length of stay to reach the above functional goals is: Not applicable 9. Anticipated D/C setting: Home 10. Anticipated post D/C treatments: [facility] therapy 11. Overall Rehab/Functional Prognosis: fair  RECOMMENDATIONS: This patient's condition is appropriate for continued rehabilitative care in the following setting: SNF Patient has agreed to participate in recommended program. Yes Note that insurance prior authorization may be required for reimbursement for recommended care.  Comment: Patient states he has no one to help him at home. He plans to go to a skilled nursing facility. PT eval also recommends SNF  Charlett Blake M.D. [facility] FAAPM&R (Sports Med, Neuromuscular Med) Diplomate Am Board of Electrodiagnostic Med  Cathlyn Parsons., PA-C 08/26/2017

## 2017-08-26 NOTE — Progress Notes (Signed)
[facility] Rounding Note  Subjective:   Biggest c/o is the "my tail is raw" Not too hungry but no nausea No SOB   Objective Vital signs in last 24 hours: Vitals:   08/26/17 0000 08/26/17 0400 08/26/17 0750 08/26/17 1111  BP: (!) 126/49 (!) 123/41 (!) 145/55 (!) 124/47  Pulse: 83 82 88 91  Resp: 18 (!) 0 20   Temp: 98.5 F (36.9 C) 97.7 F (36.5 C) 97.8 F (36.6 C)   TempSrc: Axillary Axillary Oral   SpO2: 95% 96% 95%   Weight:  72.6 kg (160 lb)    Height:       Weight change: 0.976 kg (2 lb 2.4 oz)  Intake/Output Summary (Last 24 hours) at 08/26/17 1244 Last data filed at 08/26/17 0406  Gross per 24 hour  Intake                0 ml  Output              625 ml  Net             -625 ml    Physical Exam: Extremely hard of hearing  Alert, awake Sitting in the chair by the window again No JVD Lungs grossly clear, no definite crackles S1S12 No S3 HR low 100's Sternotomy scar healing Abdomen soft, non tender No LE edema No asterixis (surprisingly)   Recent Labs Lab 08/24/17 0416 08/25/17 0334 08/26/17 0504  NA 138 139 138  K 3.5 3.5 3.6  CL 104 103 101  CO2 22 22 22   GLUCOSE 105* 125* 107*  BUN 89* 99* 100*  CREATININE 5.20* 5.25* 5.17*  CALCIUM 9.2 9.4 9.5  PHOS 6.1* 5.8* 5.3*     Recent Labs Lab 08/22/17 0612 08/23/17 0343 08/24/17 0416 08/25/17 0334 08/26/17 0504  AST 24 20 18   --   --   ALT 31 28 25   --   --   ALKPHOS 77 72 65  --   --   BILITOT 0.7 0.8 1.1  --   --   PROT 5.9* 6.0* 6.2*  --   --   ALBUMIN 2.4* 2.4* 2.5* 2.4* 2.4*    Recent Labs Lab 08/22/17 0745 08/23/17 0343 08/23/17 1204 08/25/17 0334 08/26/17 0504  WBC 6.6 5.8 7.1 7.1 6.5  HGB 9.9* 9.3* 9.4* 9.4* 8.9*  HCT 29.3* 27.6* 28.4* 28.7* 27.0*  MCV 98.7 99.3 100.0 100.3* 101.1*  PLT 159 129* 152 164 160    Recent Labs Lab 08/22/17 0743 08/22/17 1138 08/22/17 1556 08/22/17 2116 08/23/17 0806  GLUCAP 117* 148* 100* 113* 121*   Iron/TIBC/Ferritin/ %Sat     Component Value Date/Time   IRON 38 (L) 08/24/2017 0416   TIBC 280 08/24/2017 0416   FERRITIN 234 08/24/2017 0416   IRONPCTSAT 14 (L) 08/24/2017 0416   Medications: Infusions: . sodium chloride      Scheduled Medications: . allopurinol  200 mg Oral Daily  . amiodarone  200 mg Oral BID  . aspirin EC  81 mg Oral Daily   Or  . aspirin  81 mg Per Tube Daily  . bisacodyl  10 mg Oral Daily   Or  . bisacodyl  10 mg Rectal Daily  . chlorhexidine  15 mL Mouth Rinse BID  . Chlorhexidine Gluconate Cloth  6 each Topical Q0600  . darbepoetin (ARANESP) injection - NON-DIALYSIS  60 mcg Subcutaneous Q Tue-1800  . docusate sodium  200 mg Oral Daily  . feeding supplement (ENSURE ENLIVE)  237 mL Oral BID BM  . finasteride  5 mg Oral Daily  . furosemide  40 mg Oral BID  . mouth rinse  15 mL Mouth Rinse q12n4p  . metoprolol tartrate  12.5 mg Oral BID  . pantoprazole  40 mg Oral QHS  . rosuvastatin  10 mg Oral q1800  . sodium chloride flush  10-40 mL Intracatheter Q12H  . sodium chloride flush  3 mL Intravenous Q12H  . sorbitol  15 mL Oral Once  . Warfarin - Physician Dosing Inpatient   Does not apply q1800   Assessment/Plan: 83 y.o. yo male with CKD3 admitted on 07/31/2017 w/angina/NSTEMI, had 2V CABG 08/07/17, AF with RVR, aspiration with ? PNA 9/18. Asked to see for AKI on CKD3 (BL creatinine mid 1's-2 /Patel).  CRRT 9/24-9/24. Temp cath out 9/30. Declines any further dialysis   1. AKI on CKD3  - baseline crt 1.5-2.  AKI 2/2 ATN after CABG. CRRT 9/20-9/24. Creatinine stuck in the mid 5's. Not clinically uremic. Pt told Dr. Moshe Cipro that he would not want HD under any circumstances. He reiterated that to me on 10/2.  Vascath removed 9/30. Trending labs. Not sure which way this situation will go. If anything to me he looks dry to me and is several kg under his preop weight (admit weight 76-79 kg (various scales), max weight 89 kg pre CRRT, nadir since then 71.6 kg. I would favor stopping lasix  (current dose probably not too effective at this low GFR anyway) and giving him a slow liter of IVF to see if helps at all.   2. Anemia- hgb was over 11 but now in the 9's - Aranesp 60 was added. TSat low at 14. Dosed with Feraheme 10/1  3. Secondary hyperparathyroidism- phos if anything low- repleted 9/24- then up to 6.1. Added low dose ca acetate 10/1 - should not require long term if renal function improves  4. Hypo K-  replete PRN  5. S/p CABG 08/07/17 Adrian Prince, MD [facility] [phone] Pager 08/26/2017, 12:44 PM

## 2017-08-26 NOTE — Progress Notes (Signed)
Please refer to Dr. Letta Pate consult. Pt is appropriate for SNF rehab, not CIR. I have alerted RN CM and SW. We will sign off. [phone]

## 2017-08-27 ENCOUNTER — Inpatient Hospital Stay (HOSPITAL_COMMUNITY): Payer: Medicare Other

## 2017-08-27 LAB — RENAL FUNCTION PANEL
ALBUMIN: 2.3 g/dL — AB (ref 3.5–5.0)
ANION GAP: 14 (ref 5–15)
BUN: 100 mg/dL — AB (ref 6–20)
CALCIUM: 9.2 mg/dL (ref 8.9–10.3)
CO2: 22 mmol/L (ref 22–32)
Chloride: 106 mmol/L (ref 101–111)
Creatinine, Ser: 4.79 mg/dL — ABNORMAL HIGH (ref 0.61–1.24)
GFR calc Af Amer: 12 mL/min — ABNORMAL LOW (ref >60)
GFR calc non Af Amer: 10 mL/min — ABNORMAL LOW (ref >60)
Glucose, Bld: 103 mg/dL — ABNORMAL HIGH (ref 65–99)
PHOSPHORUS: 4.8 mg/dL — AB (ref 2.5–4.6)
Potassium: 3.7 mmol/L (ref 3.5–5.1)
SODIUM: 142 mmol/L (ref 135–145)

## 2017-08-27 LAB — PROTIME-INR
INR: 3.4
Prothrombin Time: 34 seconds — ABNORMAL HIGH (ref 11.4–15.2)

## 2017-08-27 MED ORDER — PHYTONADIONE 5 MG PO TABS
2.5000 mg | ORAL_TABLET | Freq: Once | ORAL | Status: AC
  Administered 2017-08-27: 2.5 mg via ORAL
  Filled 2017-08-27: qty 1

## 2017-08-27 NOTE — Progress Notes (Signed)
Subjective:   Creatinine is better today following stopping Lasix as well as some hydration.  Complains of nausea.  No recurrent chest pain.  EKG reviewed from yesterday did not show any acute changes.  Maintaining sinus rhythm.  Objective:  Vital Signs in the last 24 hours: BP (!) 124/44   Pulse 84   Temp 97.8 F (36.6 C) (Oral)   Resp 14   Ht 5\' 7"  (1.702 m)   Wt 72.8 kg (160 lb 9.6 oz)   SpO2 96%   BMI 25.15 kg/m   Physical Exam: Elderly WM in NAD, HOH Lungs:  Reduced BS Cardiac:  regular rhythm, normal S1 and S2, no S3 Extremities:  No edema present  Intake/Output from previous day: 10/03 0701 - 10/04 0700 In: 655 [I.V.:655] Out: 650 [Urine:650] Weight Filed Weights   08/25/17 0600 08/26/17 0400 08/27/17 0411  Weight: 71.6 kg (157 lb 13.6 oz) 72.6 kg (160 lb) 72.8 kg (160 lb 9.6 oz)    Lab Results: Basic Metabolic Panel:  Recent Labs  08/26/17 0504 08/27/17 0452  NA 138 142  K 3.6 3.7  CL 101 106  CO2 22 22  GLUCOSE 107* 103*  BUN 100* 100*  CREATININE 5.17* 4.79*    CBC:  Recent Labs  08/25/17 0334 08/26/17 0504  WBC 7.1 6.5  HGB 9.4* 8.9*  HCT 28.7* 27.0*  MCV 100.3* 101.1*  PLT 164 160    BNP    Component Value Date/Time   BNP 560.8 (H) 08/01/2017 0057    Telemetry: Currently sinus with rate 80  Assessment/Plan:  1. Recent CABG 2. ARF on top of chronic but  UOP preserved around 1L.day.  Cr. Improved following hydration  3. Prior stroke and dysphagia 4. Atrial fibrillation currently in sinus rhythm   5.  Chest discomfort has not recurred 6.  Nausea questionable amiodarone versus uremia   Renal function appears to be improved somewhat.  Still nauseated.  Agree with reduction of amiodarone to 200 mg daily.      Kerry Hough  MD [facility]  08/27/2017, 1:13 PM

## 2017-08-27 NOTE — Progress Notes (Signed)
[facility] Rounding Note  Subjective:   Giving a little NS (50/hour) started yesterday for a liter as looked dry to me UOP about same, creatinine down a little Swallow eval today Backside still hurts Not SOB   Objective Vital signs in last 24 hours: Vitals:   08/26/17 2043 08/27/17 0100 08/27/17 0411 08/27/17 0818  BP: (!) 129/49 (!) 140/58 (!) 129/52 (!) 141/57  Pulse: 87 88 88 88  Resp: 15 14 17 15   Temp: (!) 96.9 F (36.1 C)  98.3 F (36.8 C)   TempSrc: Oral  Oral   SpO2: 99% 96% 97% 97%  Weight:   72.8 kg (160 lb 9.6 oz)   Height:       Weight change: 0.272 kg (9.6 oz)  Intake/Output Summary (Last 24 hours) at 08/27/17 1034 Last data filed at 08/27/17 0316  Gross per 24 hour  Intake              655 ml  Output              650 ml  Net                5 ml    Physical Exam: Hard of hearing  Alert, awake No JVD Lungs grossly clear S1S12 No S3 HR 80's Sternotomy scar healing Abdomen soft, non tender No LE edema No asterixis    Recent Labs Lab 08/25/17 0334 08/26/17 0504 08/27/17 0452  NA 139 138 142  K 3.5 3.6 3.7  CL 103 101 106  CO2 22 22 22   GLUCOSE 125* 107* 103*  BUN 99* 100* 100*  CREATININE 5.25* 5.17* 4.79*  CALCIUM 9.4 9.5 9.2  PHOS 5.8* 5.3* 4.8*     Recent Labs Lab 08/22/17 0612 08/23/17 0343 08/24/17 0416 08/25/17 0334 08/26/17 0504 08/27/17 0452  AST 24 20 18   --   --   --   ALT 31 28 25   --   --   --   ALKPHOS 77 72 65  --   --   --   BILITOT 0.7 0.8 1.1  --   --   --   PROT 5.9* 6.0* 6.2*  --   --   --   ALBUMIN 2.4* 2.4* 2.5* 2.4* 2.4* 2.3*    Recent Labs Lab 08/22/17 0745 08/23/17 0343 08/23/17 1204 08/25/17 0334 08/26/17 0504  WBC 6.6 5.8 7.1 7.1 6.5  HGB 9.9* 9.3* 9.4* 9.4* 8.9*  HCT 29.3* 27.6* 28.4* 28.7* 27.0*  MCV 98.7 99.3 100.0 100.3* 101.1*  PLT 159 129* 152 164 160    Recent Labs Lab 08/22/17 0743 08/22/17 1138 08/22/17 1556 08/22/17 2116 08/23/17 0806  GLUCAP 117* 148* 100* 113* 121*    Iron/TIBC/Ferritin/ %Sat    Component Value Date/Time   IRON 38 (L) 08/24/2017 0416   TIBC 280 08/24/2017 0416   FERRITIN 234 08/24/2017 0416   IRONPCTSAT 14 (L) 08/24/2017 0416   Medications: Infusions: . sodium chloride    . sodium chloride 0.9 % 1,000 mL infusion 50 mL/hr at 08/27/17 0855    Scheduled Medications: . allopurinol  200 mg Oral Daily  . amiodarone  200 mg Oral BID  . aspirin EC  81 mg Oral Daily   Or  . aspirin  81 mg Per Tube Daily  . bisacodyl  10 mg Oral Daily   Or  . bisacodyl  10 mg Rectal Daily  . chlorhexidine  15 mL Mouth Rinse BID  . Chlorhexidine Gluconate Cloth  6  each Topical [id number]  . darbepoetin (ARANESP) injection - NON-DIALYSIS  60 mcg Subcutaneous Q Tue-1800  . docusate sodium  200 mg Oral Daily  . feeding supplement (ENSURE ENLIVE)  237 mL Oral BID BM  . finasteride  5 mg Oral Daily  . mouth rinse  15 mL Mouth Rinse q12n4p  . metoprolol tartrate  12.5 mg Oral BID  . pantoprazole  40 mg Oral QHS  . rosuvastatin  10 mg Oral q1800  . sodium chloride flush  10-40 mL Intracatheter Q12H  . sodium chloride flush  3 mL Intravenous Q12H  . sorbitol  15 mL Oral Once  . Warfarin - Physician Dosing Inpatient   Does not apply q1800   Assessment/Plan: 83 y.o. yo male with CKD3 admitted on 07/31/2017 w/angina/NSTEMI, had 2V CABG 08/07/17, AF with RVR, aspiration with ? PNA 9/18. Asked to see for AKI on CKD3 (BL creatinine mid 1's-2 /Patel).  CRRT 9/24-9/24. Temp cath out 9/30. Declines any further dialysis   1. AKI on CKD3  - baseline crt 1.5-2.  AKI 2/2 ATN after CABG. CRRT 9/20-9/24. Creatinine was stuck in the mid 5's. Not clinically uremic. 1. Pt had told Dr. Moshe Cipro that he would not want HD under any circumstances. He reiterated that to me on 10/2.   2. Vascath removed 9/30. Not sure which way this situation will go.  3. If anything to me he still looks dry to me and is still several kg under his preop weight (admit weight 76-79 kg (various  scales), max weight 89 kg pre CRRT, nadir since then 71.6 kg; 72.8 today 4. Lasix stopped yesterday 10/3, getting IVF at 20 since then. 5. Will continue for another 24 hours then D/C the fluids  2. Anemia- hgb was over 11 but now 8.9 1. Aranesp 60 was added. Next due 10/9 2. TSat low at 14. Dosed with Feraheme 10/1  3. Secondary hyperparathyroidism- phos if anything low- repleted 9/24- then up to 6.1.  1. Low dose ca acetate added 10/1 - should not require long term if renal function improves  4. Hypo K-  replete PRN  5. S/p CABG 08/07/17 Stanley Robles 1. Looking at possible SNF  Stanley Maes, Stanley Robles [facility] [phone] Pager 08/27/2017, 10:34 AM

## 2017-08-27 NOTE — Progress Notes (Addendum)
      [address]       [place] ,[place] [address]             [phone]        20 Days Post-Op Procedure(s) (LRB): CORONARY ARTERY BYPASS GRAFTING (CABG) x 2 WITH ENDOSCOPIC HARVESTING OF RIGHT SAPHENOUS VEIN. (N/A) TRANSESOPHAGEAL ECHOCARDIOGRAM (TEE) (N/A)  Subjective: Patient is very hard of hearing. He states his nose is "stuffed up" this am. He states his appetite is not too "good".  Objective: Vital signs in last 24 hours: Temp:  [96.9 F (36.1 C)-98.3 F (36.8 C)] 98.3 F (36.8 C) (10/04 0411) Pulse Rate:  [87-92] 88 (10/04 0411) Cardiac Rhythm: Normal sinus rhythm (10/03 2000) Resp:  [14-23] 17 (10/04 0411) BP: (120-145)/(47-58) 129/52 (10/04 0411) SpO2:  [91 %-99 %] 97 % (10/04 0411) Weight:  [160 lb 9.6 oz (72.8 kg)] 160 lb 9.6 oz (72.8 kg) (10/04 0411)  Pre op weight 78.7 kg Current Weight  08/27/17 160 lb 9.6 oz (72.8 kg)      Intake/Output from previous day: 10/03 0701 - 10/04 0700 In: 655 [I.V.:655] Out: 650 [Urine:650]   Physical Exam:  Cardiovascular: RRR Pulmonary: Clear to auscultation bilaterally Abdomen: Soft, non tender, bowel sounds present. Extremities: No lower extremity edema. Wounds: Clean and dry.  No erythema or signs of infection.  Lab Results: CBC:  Recent Labs  08/25/17 0334 08/26/17 0504  WBC 7.1 6.5  HGB 9.4* 8.9*  HCT 28.7* 27.0*  PLT 164 160   BMET:   Recent Labs  08/26/17 0504 08/27/17 0452  NA 138 142  K 3.6 3.7  CL 101 106  CO2 22 22  GLUCOSE 107* 103*  BUN 100* 100*  CREATININE 5.17* 4.79*  CALCIUM 9.5 9.2    PT/INR:  Lab Results  Component Value Date   INR 3.40 08/27/2017   INR 3.27 08/26/2017   INR 2.72 08/25/2017   ABG:  INR: Will add last result for INR, ABG once components are confirmed Will add last 4 CBG results once components are confirmed  Assessment/Plan:  1. CV - S/p NSTEMI.Previous a fib. SR in the 80's.On Lopressor 12.5 mg bid, Amiodarone 200 mg bid, and  Coumadin. INR increased to 3.4 this am. No Coumadin again tonight. 2.  Pulmonary - On room air. CXR today showed no pneumothorax, persistent left lobe consolidation (likely atelectasis). Encourage incentive spirometer and flutter valve. 3. AKI on CKD (stage III)-creatinine decreased to 4.79 this am . Baseline creatinine 1.5-2. Has had CRRT previously. Patient does not wish to have further HD. 4.  Anemia of chronic disease - Last H and H decreased to 8.9 and 27. On Aranesp. 5. Will remove EPW when INR decreased 6. GI-Had prior stroke. Dysphagia 2 (fine chop), nectar thick liquid per speech recommendation so I changed his diet from dysphagia 1 to above. 7. Will need SNF when ready for discharge  ZIMMERMAN,DONIELLE MPA-C 08/27/2017,7:19 AM  decrease amiodarone to 200/day due to poor appetite, nausea CIR eval-- not candidate SNF after EPWs out  patient examined and medical record reviewed,agree with above note. Tharon Aquas Trigt III 08/27/2017

## 2017-08-27 NOTE — Progress Notes (Signed)
Clinical Social Worker still following patient for disposition needs once medically stable. Patient has several bed offers for SNF.   Rhea Pink, MSW,  [place]

## 2017-08-27 NOTE — Progress Notes (Signed)
PT Cancellation Note  Patient Details Name: Stanley Robles MRN: [id number] DOB: 30-May-1932   Cancelled Treatment:    Reason Eval/Treat Not Completed: Patient at procedure or test/unavailable Pt leaving unit for swallow study. PT will check on pt later as time allows.    Salina April, PTA Pager: [phone]   08/27/2017, 10:59 AM

## 2017-08-27 NOTE — Progress Notes (Signed)
Physical Therapy Treatment Patient Details Name: Stanley Robles MRN: [id number] DOB: [birth year] Today's Date: 08/27/2017    History of Present Illness 83 yo admitted with NSTEMI s/p cath which showed severe left main disease and total right coronary artery stenosis, s/p CABG 08/08/17. experienced acute renal failure Placed on CRRT 08/12/17 removed from CRRT 08/17/17 PMhx:  CVA, sensorineural hearing loss, HLD, PVD, pAF, CKD stage 3, HTN, NSTEMI, chronic diastolic heart failure    PT Comments    Patient tolerated gait this session but limited by bilat foot pain. Pt required mod A for bed mobility and min A +2 for safety with functional transfers and ambulation. Vitals WNL during session. Continue to progress as tolerated with anticipated d/c to SNF for further skilled PT services.    Follow Up Recommendations  SNF     Equipment Recommendations  Other (comment)    Recommendations for Other Services OT consult     Precautions / Restrictions Precautions Precautions: Fall;Sternal Precaution Comments: pt able to recall sternal precautions Restrictions Weight Bearing Restrictions: Yes (sternal precautions)    Mobility  Bed Mobility Overal bed mobility: Needs Assistance Bed Mobility: Supine to Sit     Supine to sit: Mod assist;HOB elevated     General bed mobility comments: assist to elevate trunk into sitting and to scoot hips toward EOB with use of  chuck pad; cues for sequencing and technique to maintain sternal precautions  Transfers Overall transfer level: Needs assistance Equipment used: Rolling walker (2 wheeled) Transfers: Sit to/from Stand Sit to Stand: Min assist;+2 physical assistance         General transfer comment: assist to power up into standing with cues for technique  Ambulation/Gait Ambulation/Gait assistance: Min assist;+2 safety/equipment Ambulation Distance (Feet):  (153ft X2) Assistive device: Rolling walker (2 wheeled) Gait Pattern/deviations: Narrow  base of support;Step-through pattern;Decreased step length - right;Decreased step length - left;Decreased stride length;Trunk flexed Gait velocity: slowed   General Gait Details: multimodal cues for posture and forward gaze; vc for increased bilat step lengths; one seated rest break   Stairs            Wheelchair Mobility    Modified Rankin (Stroke Patients Only)       Balance Overall balance assessment: Needs assistance Sitting-balance support: Feet supported;Bilateral upper extremity supported Sitting balance-Leahy Scale: Good     Standing balance support: Bilateral upper extremity supported Standing balance-Leahy Scale: Poor                              Cognition Arousal/Alertness: Awake/alert Behavior During Therapy: WFL for tasks assessed/performed Overall Cognitive Status: Within Functional Limits for tasks assessed                                 General Comments: HOH      Exercises      General Comments General comments (skin integrity, edema, etc.): vitals WNL      Pertinent Vitals/Pain Pain Assessment: Faces Faces Pain Scale: Hurts even more Pain Location: feet Pain Descriptors / Indicators: Aching;Grimacing;Sore Pain Intervention(s): Limited activity within patient's tolerance;Monitored during session;Repositioned    Home Living                      Prior Function            PT Goals (current goals can now be found in the care  plan section) Acute Rehab PT Goals PT Goal Formulation: With patient Time For Goal Achievement: 09/09/17 Potential to Achieve Goals: Fair Progress towards PT goals: Progressing toward goals    Frequency    Min 3X/week      PT Plan Current plan remains appropriate    Co-evaluation              AM-PAC PT "6 Clicks" Daily Activity  Outcome Measure  Difficulty turning over in bed (including adjusting bedclothes, sheets and blankets)?: Unable Difficulty moving from  lying on back to sitting on the side of the bed? : Unable Difficulty sitting down on and standing up from a chair with arms (e.g., wheelchair, bedside commode, etc,.)?: Unable Help needed moving to and from a bed to chair (including a wheelchair)?: A Little Help needed walking in hospital room?: A Little Help needed climbing 3-5 steps with a railing? : A Lot 6 Click Score: 11    End of Session Equipment Utilized During Treatment: Gait belt Activity Tolerance: Patient tolerated treatment well Patient left: with call bell/phone within reach;in chair;with chair alarm set Nurse Communication: Mobility status PT Visit Diagnosis: Unsteadiness on feet (R26.81);Other abnormalities of gait and mobility (R26.89);Muscle weakness (generalized) (M62.81);Difficulty in walking, not elsewhere classified (R26.2);Dizziness and giddiness (R42)     Time: 5809-9833 PT Time Calculation (min) (ACUTE ONLY): 23 min  Charges:  $Gait Training: 8-22 mins $Therapeutic Activity: 8-22 mins                    G Codes:       Earney Navy, PTA Pager: [phone]     Darliss Cheney 08/27/2017, 4:27 PM

## 2017-08-27 NOTE — Progress Notes (Addendum)
Modified Barium Swallow Progress Note  Patient Details  Name: Stanley Robles MRN: [id number] Date of Birth: May 02, 1932  Today's Date: 08/27/2017  Modified Barium Swallow completed.  Full report located under Chart Review in the Imaging Section.  Brief recommendations include the following:  Clinical Impression  Patient with a mild motor based oropharyngeal dysphagia. Patient with oral holding of bolus at the base of tongue in preparation for swallow, at times in excess of 5 seconds, however swallow initiated either at or above the level of the vallecula with all solid and nectar thick liquid consistencies. With thin liquids however, swallow is delayed to the level of the pyriform sinuses which when combined with decreased laryngeal closure, results in deep silent penetration to the vocal cords. although occurring in only 25-30% of trials, cued cough weak and unsuccessful to clear hte airway resulting in a significant risk of post swallow aspiration. Suspect that patient is largely at baseline swallowing function based on previous MBS results and h/o pontine CVA however concerned that decreased functional reserve with acute illness will not allow for pulmonary tolerance of intermittent aspiration which is likely to occur with thin liquids. Recommend dysphagia 3 solids with nectar thick liquids. Prognosis for ability to advance diet good with improved medical status and conditioning. Will f/u.    Swallow Evaluation Recommendations       SLP Diet Recommendations: Dysphagia 3 (Mech soft) solids;Nectar thick liquid   Liquid Administration via: Cup;No straw   Medication Administration: Whole meds with puree   Supervision: Patient able to self feed;Intermittent supervision to cue for compensatory strategies   Compensations: Slow rate;Small sips/bites   Postural Changes: Seated upright at 90 degrees   Oral Care Recommendations: Oral care BID   Other Recommendations: Order thickener from  pharmacy;Prohibited food (jello, ice cream, thin soups);Remove water pitcher   Haile Bosler MA, CCC-SLP [phone]  Deaundra Kutzer Meryl 08/27/2017,1:54 PM

## 2017-08-28 ENCOUNTER — Inpatient Hospital Stay
Admission: RE | Admit: 2017-08-28 | Discharge: 2017-10-02 | Disposition: A | Discharge status: Home / Self Care | Payer: Medicare Other | Source: Ambulatory Visit | Attending: Internal Medicine | Admitting: Internal Medicine

## 2017-08-28 LAB — RENAL FUNCTION PANEL
Albumin: 2.3 g/dL — ABNORMAL LOW (ref 3.5–5.0)
Anion gap: 8 (ref 5–15)
BUN: 84 mg/dL — AB (ref 6–20)
CHLORIDE: 115 mmol/L — AB (ref 101–111)
CO2: 23 mmol/L (ref 22–32)
CREATININE: 3.9 mg/dL — AB (ref 0.61–1.24)
Calcium: 9.2 mg/dL (ref 8.9–10.3)
GFR calc Af Amer: 15 mL/min — ABNORMAL LOW (ref >60)
GFR, EST NON AFRICAN AMERICAN: 13 mL/min — AB (ref >60)
Glucose, Bld: 113 mg/dL — ABNORMAL HIGH (ref 65–99)
Phosphorus: 4.1 mg/dL (ref 2.5–4.6)
Potassium: 4.2 mmol/L (ref 3.5–5.1)
SODIUM: 146 mmol/L — AB (ref 135–145)

## 2017-08-28 LAB — PROTIME-INR
INR: 2.54
Prothrombin Time: 27.1 seconds — ABNORMAL HIGH (ref 11.4–15.2)

## 2017-08-28 MED ORDER — AMIODARONE HCL 200 MG PO TABS
200.0000 mg | ORAL_TABLET | Freq: Every day | ORAL | Status: DC
  Administered 2017-08-28: 200 mg via ORAL
  Filled 2017-08-28: qty 1

## 2017-08-28 MED ORDER — ASPIRIN 81 MG PO TBEC: 81.0000 mg | DELAYED_RELEASE_TABLET | Freq: Every day | ORAL | Status: AC

## 2017-08-28 MED ORDER — ALLOPURINOL 300 MG PO TABS: 150.0000 mg | ORAL_TABLET | Freq: Every day | ORAL | Status: AC

## 2017-08-28 MED ORDER — ENSURE ENLIVE PO LIQD: 237.0000 mL | Freq: Two times a day (BID) | ORAL | 12 refills | Status: DC

## 2017-08-28 MED ORDER — AMIODARONE HCL 200 MG PO TABS: 200.0000 mg | ORAL_TABLET | Freq: Every day | ORAL | Status: DC

## 2017-08-28 MED ORDER — WARFARIN SODIUM 1 MG PO TABS: 1.0000 mg | ORAL_TABLET | Freq: Every day | ORAL | Status: DC

## 2017-08-28 MED ORDER — ROSUVASTATIN CALCIUM 10 MG PO TABS: 10.0000 mg | ORAL_TABLET | Freq: Every day | ORAL | Status: AC

## 2017-08-28 MED ORDER — METOPROLOL TARTRATE 25 MG PO TABS: 12.5000 mg | ORAL_TABLET | Freq: Two times a day (BID) | ORAL | Status: DC

## 2017-08-28 NOTE — Progress Notes (Addendum)
Around midnight patient went into afib. Has a history of paroxysmal afib. Rate controlled 90-100. EKG done to confirm heart rhythm. Will continue to monitor

## 2017-08-28 NOTE — Care Management Important Message (Signed)
Important Message  Patient Details  Name: Stanley Robles MRN: [id number] Date of Birth: [birth year]   Medicare Important Message Given:  Yes    [name] Abena 08/28/2017, 9:40 AM

## 2017-08-28 NOTE — Progress Notes (Signed)
      [address]       [place] [address]             [phone]        21 Days Post-Op Procedure(s) (LRB): CORONARY ARTERY BYPASS GRAFTING (CABG) x 2 WITH ENDOSCOPIC HARVESTING OF RIGHT SAPHENOUS VEIN. (N/A) TRANSESOPHAGEAL ECHOCARDIOGRAM (TEE) (N/A)  Subjective: Patient is very hard of hearing.   Objective: Vital signs in last 24 hours: Temp:  [97.5 F (36.4 C)-98 F (36.7 C)] 97.7 F (36.5 C) (10/05 0400) Pulse Rate:  [80-104] 104 (10/05 0400) Cardiac Rhythm: Normal sinus rhythm (10/05 0512) Resp:  [12-23] 15 (10/05 0400) BP: (110-143)/(41-81) 110/59 (10/05 0400) SpO2:  [91 %-100 %] 96 % (10/05 0400) Weight:  [158 lb 8 oz (71.9 kg)] 158 lb 8 oz (71.9 kg) (10/05 0500)  Pre op weight 78.7 kg Current Weight  08/28/17 158 lb 8 oz (71.9 kg)      Intake/Output from previous day: 10/04 0701 - 10/05 0700 In: 480 [P.O.:480] Out: 1350 [Urine:1350]   Physical Exam:  Cardiovascular: RRR Pulmonary: Clear to auscultation bilaterally Abdomen: Soft, non tender, bowel sounds present. Extremities: No lower extremity edema. Wounds: Clean and dry.  No erythema or signs of infection.  Lab Results: CBC:  Recent Labs  08/26/17 0504  WBC 6.5  HGB 8.9*  HCT 27.0*  PLT 160   BMET:   Recent Labs  08/26/17 0504 08/27/17 0452  NA 138 142  K 3.6 3.7  CL 101 106  CO2 22 22  GLUCOSE 107* 103*  BUN 100* 100*  CREATININE 5.17* 4.79*  CALCIUM 9.5 9.2    PT/INR:  Lab Results  Component Value Date   INR 2.54 08/28/2017   INR 3.40 08/27/2017   INR 3.27 08/26/2017   ABG:  INR: Will add last result for INR, ABG once components are confirmed Will add last 4 CBG results once components are confirmed  Assessment/Plan:  1. CV - S/p NSTEMI.Previous a fib. SR in the 80's this am.On Lopressor 12.5 mg bid, Amiodarone 200 mg daily, and Coumadin. INR increased to 2.54 this am. Will give 1 mg of Coumadin at discharge, per Dr. Prescott Gum. 2.  Pulmonary -  On room air. Encourage incentive spirometer and flutter valve. 3. AKI on CKD (stage III)-creatinine yesterday  4.79 this am . Baseline creatinine 1.5-2. Has had CRRT previously. Patient does not wish to have further HD. 4.  Anemia of chronic disease - Last H and H decreased to 8.9 and 27. On Aranesp. 5. Will remove EPW as INR decreased and per Dr. Prescott Gum 6. GI-Had prior stroke. Speech evaluation yesterday recommended dysphagia 3 (mechanical soft), nectar thick liquid. 7. Remove PICC line 8. SNF today  Ahmad Vanwey MPA-C 08/28/2017,7:11 AM

## 2017-08-28 NOTE — Progress Notes (Signed)
08/28/2017 1710 Pt discharged to [facility] via [facility].  Report called to [facility]. Carney Corners

## 2017-08-28 NOTE — Progress Notes (Signed)
[facility] Rounding Note  Subjective:   Fluids off Being discharged to [facility] today   Objective Vital signs in last 24 hours: Vitals:   08/28/17 0044 08/28/17 0400 08/28/17 0500 08/28/17 0826  BP: (!) 111/52 (!) 110/59  (!) 107/37  Pulse: (!) 102 (!) 104  88  Resp: 16 15  18   Temp:  97.7 F (36.5 C)  97.6 F (36.4 C)  TempSrc:  Oral  Oral  SpO2: 98% 96%  100%  Weight:   71.9 kg (158 lb 8 oz)   Height:       Weight change: -0.953 kg (-2 lb 1.6 oz)  Intake/Output Summary (Last 24 hours) at 08/28/17 1236 Last data filed at 08/28/17 0930  Gross per 24 hour  Intake              480 ml  Output             1400 ml  Net             -920 ml    Physical Exam: Hard of hearing  Alert, awake, lying in bed No JVD Lungs grossly clear S1S12 No S3 HR 80's Sternotomy scar healing Abdomen soft, non tender No LE edema No asterixis    Recent Labs Lab 08/25/17 0334 08/26/17 0504 08/27/17 0452  NA 139 138 142  K 3.5 3.6 3.7  CL 103 101 106  CO2 22 22 22   GLUCOSE 125* 107* 103*  BUN 99* 100* 100*  CREATININE 5.25* 5.17* 4.79*  CALCIUM 9.4 9.5 9.2  PHOS 5.8* 5.3* 4.8*     Recent Labs Lab 08/22/17 0612 08/23/17 0343 08/24/17 0416 08/25/17 0334 08/26/17 0504 08/27/17 0452  AST 24 20 18   --   --   --   ALT 31 28 25   --   --   --   ALKPHOS 77 72 65  --   --   --   BILITOT 0.7 0.8 1.1  --   --   --   PROT 5.9* 6.0* 6.2*  --   --   --   ALBUMIN 2.4* 2.4* 2.5* 2.4* 2.4* 2.3*    Recent Labs Lab 08/22/17 0745 08/23/17 0343 08/23/17 1204 08/25/17 0334 08/26/17 0504  WBC 6.6 5.8 7.1 7.1 6.5  HGB 9.9* 9.3* 9.4* 9.4* 8.9*  HCT 29.3* 27.6* 28.4* 28.7* 27.0*  MCV 98.7 99.3 100.0 100.3* 101.1*  PLT 159 129* 152 164 160    Recent Labs Lab 08/22/17 0743 08/22/17 1138 08/22/17 1556 08/22/17 2116 08/23/17 0806  GLUCAP 117* 148* 100* 113* 121*   Iron/TIBC/Ferritin/ %Sat    Component Value Date/Time   IRON 38 (L) 08/24/2017 0416   TIBC 280 08/24/2017 0416   FERRITIN 234 08/24/2017 0416   IRONPCTSAT 14 (L) 08/24/2017 0416   Medications: Infusions:   Scheduled Medications: . allopurinol  200 mg Oral Daily  . amiodarone  200 mg Oral Daily  . aspirin EC  81 mg Oral Daily   Or  . aspirin  81 mg Per Tube Daily  . bisacodyl  10 mg Oral Daily   Or  . bisacodyl  10 mg Rectal Daily  . chlorhexidine  15 mL Mouth Rinse BID  . Chlorhexidine Gluconate Cloth  6 each Topical Q0600  . darbepoetin (ARANESP) injection - NON-DIALYSIS  60 mcg Subcutaneous Q Tue-1800  . docusate sodium  200 mg Oral Daily  . feeding supplement (ENSURE ENLIVE)  237 mL Oral BID BM  . finasteride  5  mg Oral Daily  . mouth rinse  15 mL Mouth Rinse q12n4p  . metoprolol tartrate  12.5 mg Oral BID  . pantoprazole  40 mg Oral QHS  . rosuvastatin  10 mg Oral q1800  . sorbitol  15 mL Oral Once  . Warfarin - Physician Dosing Inpatient   Does not apply q1800   Assessment/Plan: 83 y.o. yo male with CKD3 admitted on 07/31/2017 w/angina/NSTEMI, had 2V CABG 08/07/17, AF with RVR, aspiration with ? PNA 9/18. Asked to see for AKI on CKD3 (BL creatinine mid 1's-2 /Patel).  CRRT 9/24-9/24. Temp cath out 9/30. Declines any further dialysis   1. AKI on CKD3  - baseline crt 1.5-2.  AKI 2/2 ATN after CABG. CRRT 9/20-9/24. Creatinine was stuck in the mid 5's. Not clinically uremic. 1. Pt had told Dr. Moshe Cipro that he would not want HD under any circumstances. He reiterated that to me on 10/2.   2. Vascath removed 9/30. Not sure which way this situation will go.  3. If anything to me he still looked dry to me and was still several kg under his preop weight (admit weight 76-79 kg (various scales), max weight 89 kg pre CRRT, nadir since then 71.6 kg; 72.8 today 4. Lasix stopped 10/3, IVF at 50 10/3-10/5 5. No labs done this AM. 6. Being discharged to [facility] today - Have scheduled outpt followup with Dr. Florene Glen at [facility] on 09/18/17 at 9:15 AM for followup renal issues. Will get  repeat labs then  2. Anemia- hgb was over 11 but now 8.9 1. Aranesp 60 was added. Next would be due 10/9 2. TSat low at 14. Dosed with Feraheme 10/1  3. Secondary hyperparathyroidism- phos if anything low- repleted 9/24- then up to 6.1.  1. Low dose ca acetate added 10/1 - should not require long term if renal function improves  4. Hypo K-  replete PRN  5. S/p CABG 08/07/17 Prescott Gum 1. Looking at possible SNF  Being discharged to [facility] today - Have scheduled outpt followup with Dr. Florene Glen at [facility] on 09/18/17 at 9:15 AM for followup renal issues. Will get repeat labs then. Please make sure [facility] aware of this appt. (Made patient and his girlfriend aware)  Jamal Maes, MD [facility] [phone] Pager 08/28/2017, 12:36 PM

## 2017-08-28 NOTE — Progress Notes (Signed)
New CVP placed at bedside for PICC line.

## 2017-08-28 NOTE — Progress Notes (Signed)
  Speech Language Pathology Treatment: Dysphagia  Patient Details Name: Stanley Robles MRN: [id number] DOB: 1932-01-11 Today's Date: 08/28/2017 Time: 4098-1191 SLP Time Calculation (min) (ACUTE ONLY): 25 min  Assessment / Plan / Recommendation Clinical Impression  Pt seen for follow-up for diet tolerance, education after MBS performed yesterday. Pt HOH; SLP used written notes to communicate results of MBS and address pt's concerns as he expressed dislike of thickener. With SLP education re: impact of his deconditioning, medical status on his ability to tolerate aspiration. Educated pt that he will likely be able to advance solids and liquids as his medical status improves, especially if he follows good oral care regimen. SLP performed oral care, removing dried, bloody secretions from upper teeth and gumline prior to initiating POs. Limited observation of a few cup sips of nectar thick liquid, as pt declining other POs at this time. Pt likely transferring to [facility] later today; recommend continued skilled ST to assess tolerance of current recommendations (dys 2, nectar thick liquids) and for possible advancement as his status improves.    HPI HPI: Pt is an 83 yo male admitted with NSTEMI s/p cath which showed severe left main disease and total right coronary artery stenosis, potential CABG planned for 9/14. 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 Was evaluated by ST this admission prior to CABG; MBS 08/05/17 revealed mild oropharyngeal and cervical esophageal dysphagia, suspected to be more chronic in nature given his h/o dysphagia from prior CVA and h/o GERD. Dys 3, thin liquids, no straws recommended with ST to follow briefly through surgery given his history. Pt was intubated for CABG on 9/14, transported back to floor on vent and subsequent extubated at 10pm same day. Pt was consuming Dys 2, nectar >  developed ileus > NPO and diagnostic reassessment this morning.        SLP Plan  Continue with current plan of care       Recommendations  Diet recommendations: Dysphagia 3 (mechanical soft);Nectar-thick liquid Liquids provided via: Cup;No straw Medication Administration: Whole meds with puree Supervision: Patient able to self feed;Intermittent supervision to cue for compensatory strategies Compensations: Slow rate;Small sips/bites Postural Changes and/or Swallow Maneuvers: Seated upright 90 degrees                Oral Care Recommendations: Oral care BID Follow up Recommendations: Skilled Nursing facility SLP Visit Diagnosis: Dysphagia, unspecified (R13.10) Plan: Continue with current plan of care       Stanley, Robles, [place] Pathologist [place] 08/28/2017, 3:09 PM

## 2017-08-28 NOTE — Progress Notes (Signed)
Clinical Social Worker facilitated patient discharge including contacting patient family and facility to confirm patient discharge plans.  Clinical information faxed to facility and family agreeable with plan.  CSW arranged ambulance transport via [place] to [facility] has been called for 4:30pm pick up of patient.  RN Juliann Pulse to call [phone] (pt will go in rm# [phone]) for report prior to discharge.  Clinical Social Worker will sign off for now as social work intervention is no longer needed. Please consult [place] again if new need arises.  Rhea Pink, MSW, [place]

## 2017-08-28 NOTE — Progress Notes (Signed)
08/28/2017 1010 EPW D/C'd per order and per protocol.  Ends intact. Pt. Tolerated well.  Advised bedrest x1hr.  Call bell in reach.  Vital signs collected per protocol.  Chest tube sutures were also taken out at this time. Carney Corners

## 2017-08-31 ENCOUNTER — Encounter: Payer: Self-pay | Admitting: Internal Medicine

## 2017-08-31 ENCOUNTER — Non-Acute Institutional Stay (SKILLED_NURSING_FACILITY): Payer: Medicare Other | Admitting: Internal Medicine

## 2017-08-31 ENCOUNTER — Encounter (HOSPITAL_COMMUNITY)
Admission: RE | Admit: 2017-08-31 | Discharge: 2017-08-31 | Disposition: A | Discharge status: Home / Self Care | Payer: Medicare Other | Source: Skilled Nursing Facility | Attending: Internal Medicine | Admitting: Internal Medicine

## 2017-08-31 DIAGNOSIS — I2510 Atherosclerotic heart disease of native coronary artery without angina pectoris: Secondary | ICD-10-CM

## 2017-08-31 DIAGNOSIS — I480 Paroxysmal atrial fibrillation: Secondary | ICD-10-CM

## 2017-08-31 DIAGNOSIS — I6300 Cerebral infarction due to thrombosis of unspecified precerebral artery: Secondary | ICD-10-CM | POA: Insufficient documentation

## 2017-08-31 DIAGNOSIS — E785 Hyperlipidemia, unspecified: Secondary | ICD-10-CM | POA: Diagnosis not present

## 2017-08-31 DIAGNOSIS — N179 Acute kidney failure, unspecified: Secondary | ICD-10-CM | POA: Diagnosis not present

## 2017-08-31 DIAGNOSIS — Z951 Presence of aortocoronary bypass graft: Secondary | ICD-10-CM

## 2017-08-31 LAB — PROTIME-INR
INR: 1.92
PROTHROMBIN TIME: 21.8 s — AB (ref 11.4–15.2)

## 2017-08-31 LAB — CBC WITH DIFFERENTIAL/PLATELET
BASOS ABS: 0 10*3/uL (ref 0.0–0.1)
Basophils Relative: 1 %
EOS ABS: 0.3 10*3/uL (ref 0.0–0.7)
EOS PCT: 4 %
HCT: 35 % — ABNORMAL LOW (ref 39.0–52.0)
Hemoglobin: 11 g/dL — ABNORMAL LOW (ref 13.0–17.0)
LYMPHS ABS: 0.9 10*3/uL (ref 0.7–4.0)
Lymphocytes Relative: 14 %
MCH: 34.4 pg — AB (ref 26.0–34.0)
MCHC: 31.4 g/dL (ref 30.0–36.0)
MCV: 109.4 fL — AB (ref 78.0–100.0)
MONO ABS: 0.4 10*3/uL (ref 0.1–1.0)
Monocytes Relative: 6 %
Neutro Abs: 4.8 10*3/uL (ref 1.7–7.7)
Neutrophils Relative %: 75 %
Platelets: 158 10*3/uL (ref 150–400)
RBC: 3.2 MIL/uL — AB (ref 4.22–5.81)
RDW: 18.1 % — AB (ref 11.5–15.5)
WBC: 6.3 10*3/uL (ref 4.0–10.5)

## 2017-08-31 LAB — BASIC METABOLIC PANEL
Anion gap: 10 (ref 5–15)
BUN: 75 mg/dL — AB (ref 6–20)
CALCIUM: 9.6 mg/dL (ref 8.9–10.3)
CHLORIDE: 116 mmol/L — AB (ref 101–111)
CO2: 25 mmol/L (ref 22–32)
CREATININE: 3.56 mg/dL — AB (ref 0.61–1.24)
GFR calc non Af Amer: 15 mL/min — ABNORMAL LOW (ref >60)
GFR, EST AFRICAN AMERICAN: 17 mL/min — AB (ref >60)
GLUCOSE: 109 mg/dL — AB (ref 65–99)
Potassium: 4.2 mmol/L (ref 3.5–5.1)
Sodium: 151 mmol/L — ABNORMAL HIGH (ref 135–145)

## 2017-08-31 NOTE — Progress Notes (Signed)
Provider: Veleta Miners  Location:   [facility] Room Number: 134/P Place of Service:  SNF (31)  PCP: Glenda Chroman, MD Patient Care Team: Glenda Chroman, MD as PCP - General (Internal Medicine) Jacolyn Reedy, MD as Consulting Physician (Cardiology)  Extended Emergency Contact Information Primary Emergency Contact: [name], [place] [address] [name] of [place] Phone: [phone] Mobile Phone: [phone] Relation: Daughter Secondary Emergency Contact: [name] Address: [address] [place], [place] [address] [place] Phone: [phone] Relation: Daughter  Code Status: Full Code Goals of Care: Advanced Directive information Advanced Directives 08/31/2017  Does Patient Have a Medical Advance Directive? Yes  Type of Advance Directive (No Data)  Does patient want to make changes to medical advance directive? No - Patient declined  Copy of [place] in Chart? -  Would patient like information on creating a medical advance directive? No - Patient declined  Pre-existing out of facility DNR order (yellow form or pink MOST form) -      Chief Complaint  Patient presents with  . New Admit To SNF    New Admission Visit    HPI: Patient is a 83 y.o. male seen today for admission to SNF for therpay after undergoing CABG x2 on 08/08/2017.With Right leg Saphenous vein and also has Right Axillary cannulation  Patient has h/o Atrial fibrillation on Chronic Eliquis, Right Pontine Stroke in 2016, Significant hearing loss, Dysphagia.Hypertension, BPH And PVD  Patient was admitted to the hospital with Chest pain and increased troponin. His Cardiac cath showed Significant Left Main and LAD stenosis. And occlusion of RCA. He underwent CABG on  09/15. His Post op course was complicated by Acute on Chronic renal failure needing Hemodialysis. When patient was more awake he refused dialysis. He  also has aspiration pneumonia and was treated with antibiotics and is on Dysphagia 3 diet. He also was started on Coumadin for his Atrial fibrillation. His rate was controlled with Amiodarone, and Diltiazem. In the facility patient continues to have Nausea no Vomiting. He is not eating that well as he does not like Food in the facility. He denies any chest pain. Some coughing but no SOB or Fever. He is very hard of hearing so difficult to take his history. He was living alone before surgery. And was driving. His Daughters live close by and help take care of him. They are planning to take him Home.   Past Medical History:  Diagnosis Date  . BPH (benign prostatic hypertrophy)   . 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 Chronic kidney disease stage 3   . Coronary artery disease    DR. TILLEY IS PT'S CARDIOLGIST  . GERD (gastroesophageal reflux disease)   . Gout   . Hyperlipidemia   . Hypertensive heart disease   . Incontinence of urine   . Memory loss   . Myocardial infarction ([facility]) 1994  . Osteoarthritis   . Paroxysmal atrial fibrillation ([facility])   . Peripheral vascular disease ([facility])   . Personal history of transient ischemic attack (TIA) and cerebral infarction without residual deficit   . Right pontine stroke ([facility]) 02/27/2015  . Tooth disease    Past Surgical History:  Procedure Laterality Date  . CARDIAC CATHETERIZATION  07/21/2005  . CORONARY ARTERY BYPASS GRAFT N/A 08/07/2017   Procedure:  CORONARY ARTERY BYPASS GRAFTING (CABG) x 2 WITH ENDOSCOPIC HARVESTING OF RIGHT SAPHENOUS VEIN.;  Surgeon: Ivin Poot, MD;  Location: [facility];  Service: Open Heart Surgery;  Laterality: N/A;  . HERNIA REPAIR  12/26/2002   BILATERAL INGUINAL HERNIA REPAIR  . LEFT HEART CATH AND CORONARY ANGIOGRAPHY N/A 08/03/2017   Procedure: LEFT HEART CATH AND CORONARY ANGIOGRAPHY;  Surgeon: Sherren Mocha, MD;  Location: [facility];  Service: Cardiovascular;  Laterality: N/A;   . MASTOID SURGERY X 5    . PROSTATE SURGERY    . TEE WITHOUT CARDIOVERSION N/A 08/07/2017   Procedure: TRANSESOPHAGEAL ECHOCARDIOGRAM (TEE);  Surgeon: Prescott Gum, Collier Salina, MD;  Location: [facility];  Service: Open Heart Surgery;  Laterality: N/A;  . TOTAL KNEE ARTHROPLASTY Left 03/28/2013   Procedure: LEFT TOTAL KNEE ARTHROPLASTY;  Surgeon: Gearlean Alf, MD;  Location: [facility];  Service: Orthopedics;  Laterality: Left;    reports that he has quit smoking. He has never used smokeless tobacco. He reports that he does not drink alcohol or use drugs. Social History   Social History  . Marital status: Widowed    Spouse name: N/A  . Number of children: 3  . Years of education: 57   Occupational History  . retired     Regulatory affairs officer   Social History Main Topics  . Smoking status: Former Research scientist (life sciences)  . Smokeless tobacco: Never Used     Comment: QUIT SMOKING PRIOR TO 1980  . Alcohol use No  . Drug use: No  . Sexual activity: Not on file   Other Topics Concern  . Not on file   Social History Narrative   Retired Academic librarian and body.  Widower. Has sig other   Right handed   Caffeine use - coffee 1 cup daily    Functional Status Survey:    Family History  Problem Relation Age of Onset  . Hypertension Father   . Hypertension Mother   . Hyperlipidemia Mother   . Cancer Brother     Health Maintenance  Topic Date Due  . Samul Dada  10/19/1951  . PNA vac Low Risk Adult (1 of 2 - PCV13) 10/20/1999  . INFLUENZA VACCINE  06/24/2017    Allergies  Allergen Reactions  . Lipitor [Atorvastatin] Other (See Comments)    Severe constipation    Outpatient Encounter Prescriptions as of 08/31/2017  Medication Sig  . acetaminophen (TYLENOL) 500 MG tablet Take 1,000 mg by mouth every 8 (eight) hours as needed.   Marland Kitchen allopurinol (ZYLOPRIM) 300 MG tablet Take 0.5 tablets (150 mg total) by mouth daily.  Marland Kitchen amiodarone (PACERONE) 200 MG tablet Take 1 tablet (200 mg total) by mouth daily.  Marland Kitchen aspirin EC 81  MG EC tablet Take 1 tablet (81 mg total) by mouth daily.  . Cholecalciferol (VITAMIN D3) 2000 units TABS Take 2,000 Units by mouth daily.  Marland Kitchen docusate sodium (COLACE) 100 MG capsule Take 100 mg by mouth daily.  . feeding supplement, ENSURE ENLIVE, (ENSURE ENLIVE) LIQD Take 237 mLs by mouth 2 (two) times daily between meals.  . finasteride (PROSCAR) 5 MG tablet Take 5 mg by mouth daily.  . metoprolol tartrate (LOPRESSOR) 25 MG tablet Take 0.5 tablets (12.5 mg total) by mouth 2 (two) times daily.  Marland Kitchen omeprazole (PRILOSEC) 20 MG capsule Take 20 mg by mouth daily.  . ondansetron (ZOFRAN) 4 MG tablet Take 4 mg by mouth every 8 (eight) hours as needed for nausea or vomiting.  . rosuvastatin (CRESTOR) 10  MG tablet Take 1 tablet (10 mg total) by mouth daily.  Marland Kitchen warfarin (COUMADIN) 1 MG tablet Take 1 tablet (1 mg total) by mouth daily at 6 PM.   No facility-administered encounter medications on file as of 08/31/2017.      Review of Systems  Constitutional: Positive for activity change.  HENT: Negative.   Respiratory: Positive for cough.   Cardiovascular: Negative.   Gastrointestinal: Positive for constipation, nausea and vomiting.  Genitourinary: Negative.   Musculoskeletal: Positive for back pain.  Skin: Negative.   Neurological: Negative.   Psychiatric/Behavioral: Negative.   All other systems reviewed and are negative.   Vitals:   08/31/17 1044  BP: (!) 95/49  Pulse: (!) 44  Resp: 20  Temp: (!) 97.3 F (36.3 C)  TempSrc: Oral  SpO2: 98%   There is no height or weight on file to calculate BMI. Physical Exam  Constitutional: He appears well-developed and well-nourished.  HENT:  Head: Normocephalic.  Mouth/Throat: Oropharynx is clear and moist.  Eyes: Pupils are equal, round, and reactive to light.  Neck: Neck supple.  Cardiovascular: An irregular rhythm present. Bradycardia present.   No murmur heard. Pulmonary/Chest: Effort normal. No respiratory distress. He has decreased  breath sounds. He has no wheezes. He has no rales.  Abdominal: Soft. Bowel sounds are normal. He exhibits no distension. There is no tenderness. There is no rebound.  Musculoskeletal: He exhibits no edema.  Neurological: He is alert.  Was unable to do detail eval as patient hard of hearing. But did not have any Focal deficits.  Skin: Skin is warm and dry.  Has multiple bruises in his arms. Surgical scar looks well healed.  Psychiatric: He has a normal mood and affect. His behavior is normal. Thought content normal.    Labs reviewed: Basic Metabolic Panel:  Recent Labs  08/17/17 0501  08/18/17 0355  08/19/17 0332  08/26/17 0504 08/27/17 0452 08/28/17 1620 08/31/17 0630  NA 133*  < > 131*  < > 130*  < > 138 142 146* 151*  K 3.9  < > 3.3*  < > 3.5  < > 3.6 3.7 4.2 4.2  CL 101  < > 97*  < > 96*  < > 101 106 115* 116*  CO2 22  < > 21*  --  20*  < > 22 22 23 25   GLUCOSE 126*  < > 117*  < > 103*  < > 107* 103* 113* 109*  BUN 22*  < > 46*  < > 65*  < > 100* 100* 84* 75*  CREATININE 1.42*  < > 2.73*  < > 3.77*  < > 5.17* 4.79* 3.90* 3.56*  CALCIUM 7.5*  < > 8.0*  --  8.3*  < > 9.5 9.2 9.2 9.6  MG 1.8  --  1.9  --  1.9  --   --   --   --   --   PHOS 2.0*  < > 5.4*  --  5.6*  < > 5.3* 4.8* 4.1  --   < > = values in this interval not displayed. Liver Function Tests:  Recent Labs  08/22/17 0612 08/23/17 0343 08/24/17 0416  08/26/17 0504 08/27/17 0452 08/28/17 1620  AST 24 20 18   --   --   --   --   ALT 31 28 25   --   --   --   --   ALKPHOS 77 72 65  --   --   --   --  BILITOT 0.7 0.8 1.1  --   --   --   --   PROT 5.9* 6.0* 6.2*  --   --   --   --   ALBUMIN 2.4* 2.4* 2.5*  < > 2.4* 2.3* 2.3*  < > = values in this interval not displayed. No results for input(s): LIPASE, AMYLASE in the last 8760 hours. No results for input(s): AMMONIA in the last 8760 hours. CBC:  Recent Labs  08/14/17 0259 08/17/17 0501  08/25/17 0334 08/26/17 0504 08/31/17 0630  WBC 7.9 13.1*  < > 7.1  6.5 6.3  NEUTROABS 6.2 10.0*  --   --   --  4.8  HGB 9.2* 11.1*  < > 9.4* 8.9* 11.0*  HCT 26.9* 33.2*  < > 28.7* 27.0* 35.0*  MCV 97.8 99.4  < > 100.3* 101.1* 109.4*  PLT 108* 162  < > 164 160 158  < > = values in this interval not displayed. Cardiac Enzymes:  Recent Labs  08/01/17 0057 08/01/17 0609 08/01/17 1215  TROPONINI 10.85* 11.90* 9.78*   BNP: Invalid input(s): POCBNP Lab Results  Component Value Date   HGBA1C 5.3 08/04/2017   Lab Results  Component Value Date   TSH 3.035 08/04/2017   No results found for: VITAMINB12 No results found for: FOLATE Lab Results  Component Value Date   IRON 38 (L) 08/24/2017   TIBC 280 08/24/2017   FERRITIN 234 08/24/2017    Imaging and Procedures obtained prior to SNF admission: No results found.  Assessment/Plan  S/P CABG Patient doing well. Will start therapy . Will be followed by CVTS.   Acute on Chronic renal failure Patient BUN is improving. No Dialysis right now. Follow up with Nephrology. He has told Nephrology many times now that he is not going to do dialysis.  Vaso cath is removed. His weight is decreased due to dehydration.  Atrial fibrillation With Bradycardia and low BP will hold Metoprolol for now.  Continue on Diltiazem and Amiodarone.  On Coumadin t be slowly adjusted to INR above 2  Hypernatremia D/W daughter and patient his Nausea can be related to Uremia Will use Zofran Prn also encourage PO Fluids Lasix was stopped in the hospital. Follow up BMP . If no Improvement will need IV fluids.  CAD with Hyperlipidemia Is on Statin and aspirin Anemia Post op and Chronic renal disease. Got Aranesp 60  Next due 10/9 Constipation Will start on Miralax BPH Continue finestride Discharge Home with his daughters. Family/ staff Communication:   Labs/tests ordered: Bmp and CBC   Total time spent in this patient care encounter was 45_ minutes; greater than 50% of the visit spent counseling patient and  daughter, reviewing records , Labs and coordinating care for problems addressed at this encounter.

## 2017-09-03 ENCOUNTER — Encounter: Payer: Self-pay | Admitting: Internal Medicine

## 2017-09-03 ENCOUNTER — Encounter (HOSPITAL_COMMUNITY)
Admission: RE | Admit: 2017-09-03 | Discharge: 2017-09-03 | Disposition: A | Discharge status: Home / Self Care | Payer: Medicare Other | Source: Skilled Nursing Facility | Attending: *Deleted | Admitting: *Deleted

## 2017-09-03 ENCOUNTER — Non-Acute Institutional Stay (SKILLED_NURSING_FACILITY): Payer: Medicare Other | Admitting: Internal Medicine

## 2017-09-03 ENCOUNTER — Other Ambulatory Visit (HOSPITAL_COMMUNITY)
Admission: RE | Admit: 2017-09-03 | Discharge: 2017-09-03 | Disposition: A | Discharge status: Home / Self Care | Payer: Medicare Other | Source: Skilled Nursing Facility | Attending: Internal Medicine | Admitting: Internal Medicine

## 2017-09-03 DIAGNOSIS — R05 Cough: Secondary | ICD-10-CM | POA: Diagnosis not present

## 2017-09-03 DIAGNOSIS — R059 Cough, unspecified: Secondary | ICD-10-CM

## 2017-09-03 DIAGNOSIS — R509 Fever, unspecified: Secondary | ICD-10-CM

## 2017-09-03 DIAGNOSIS — N183 Chronic kidney disease, stage 3 (moderate): Secondary | ICD-10-CM | POA: Insufficient documentation

## 2017-09-03 LAB — CBC WITH DIFFERENTIAL/PLATELET
BASOS ABS: 0 10*3/uL (ref 0.0–0.1)
BASOS PCT: 0 %
EOS ABS: 0 10*3/uL (ref 0.0–0.7)
Eosinophils Relative: 0 %
HCT: 37.7 % — ABNORMAL LOW (ref 39.0–52.0)
HEMOGLOBIN: 11.4 g/dL — AB (ref 13.0–17.0)
LYMPHS ABS: 1.2 10*3/uL (ref 0.7–4.0)
LYMPHS PCT: 10 %
MCH: 34.1 pg — AB (ref 26.0–34.0)
MCHC: 30.2 g/dL (ref 30.0–36.0)
MCV: 112.9 fL — ABNORMAL HIGH (ref 78.0–100.0)
Monocytes Absolute: 0.5 10*3/uL (ref 0.1–1.0)
Monocytes Relative: 4 %
NEUTROS ABS: 9.9 10*3/uL — AB (ref 1.7–7.7)
Neutrophils Relative %: 86 %
Platelets: 169 10*3/uL (ref 150–400)
RBC: 3.34 MIL/uL — ABNORMAL LOW (ref 4.22–5.81)
RDW: 18.5 % — AB (ref 11.5–15.5)
WBC: 11.6 10*3/uL — ABNORMAL HIGH (ref 4.0–10.5)

## 2017-09-03 LAB — URINALYSIS, ROUTINE W REFLEX MICROSCOPIC
Bilirubin Urine: NEGATIVE
Glucose, UA: NEGATIVE mg/dL
Ketones, ur: NEGATIVE mg/dL
NITRITE: NEGATIVE
Protein, ur: 30 mg/dL — AB
SPECIFIC GRAVITY, URINE: 1.017 (ref 1.005–1.030)
pH: 6 (ref 5.0–8.0)

## 2017-09-03 LAB — BASIC METABOLIC PANEL
Anion gap: 10 (ref 5–15)
BUN: 66 mg/dL — AB (ref 6–20)
CALCIUM: 9.7 mg/dL (ref 8.9–10.3)
CO2: 27 mmol/L (ref 22–32)
CREATININE: 3.67 mg/dL — AB (ref 0.61–1.24)
Chloride: 111 mmol/L (ref 101–111)
GFR calc Af Amer: 16 mL/min — ABNORMAL LOW (ref >60)
GFR, EST NON AFRICAN AMERICAN: 14 mL/min — AB (ref >60)
GLUCOSE: 117 mg/dL — AB (ref 65–99)
Potassium: 4.6 mmol/L (ref 3.5–5.1)
Sodium: 148 mmol/L — ABNORMAL HIGH (ref 135–145)

## 2017-09-03 LAB — PROTIME-INR
INR: 3.34
PROTHROMBIN TIME: 33.6 s — AB (ref 11.4–15.2)

## 2017-09-03 NOTE — Progress Notes (Signed)
Location:   [facility] Room Number: 134/P Place of Service:  SNF (31) Provider:  Myra Rude, MD  Patient Care Team: Stanley Chroman, MD as PCP - General (Internal Medicine) Stanley Reedy, MD as Consulting Physician (Cardiology)  Extended Emergency Contact Information Primary Emergency Contact: Stanley Robles, [place] [address] Stanley Robles of [place] Phone: [phone] Mobile Phone: [phone] Relation: Daughter Secondary Emergency Contact: Stanley Robles Address: [address]. [place], [place] [address] [place] Robles Phone: [phone] Relation: Daughter  Code Status:  Full Code Goals of care: Advanced Directive information Advanced Directives 09/03/2017  Does Patient Have a Medical Advance Directive? Yes  Type of Advance Directive (No Data)  Does patient want to make changes to medical advance directive? No - Patient declined  Copy of [place] in Chart? -  Would patient like information on creating a medical advance directive? No - Patient declined  Pre-existing out of facility DNR order (yellow form or pink MOST form) -     Chief Complaint  Patient presents with  . Acute Visit    Vomiting last night    HPI:  Pt is a 83 y.o. male seen today for an acute visit for Vomiting  And low Grade temp.  Patient was admitted to SNF for therapy after undergoing CABG x2 on 08/08/2017.With Right leg Saphenous vein and also has Right Axillary cannulation. It was complicated by ARF requiring Hemodialysis, Aspiration pneumonia and Rapid Atrial fibrilation  Patient also has h/o Atrial fibrillation  Was on Chronic Eliquis, Right Pontine Stroke in 2016, Significant hearing loss, Dysphagia.Hypertension, BPH And PVD  Patient has been having off and on Nausea and had vomiting last night. He also had low grade fever with Productive cough and SOB. His appetite stays poor. He says he has to  force himself to eat. He denied any chest pain. No dysuria. It is hard to get detail history from him as he is hard of hearing. D/W his daughter in the room.  Past Medical History:  Diagnosis Date  . BPH (benign prostatic hypertrophy)   . 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 Chronic kidney disease stage 3   . Coronary artery disease    DR. TILLEY IS PT'S CARDIOLGIST  . GERD (gastroesophageal reflux disease)   . Gout   . Hyperlipidemia   . Hypertensive heart disease   . Incontinence of urine   . Memory loss   . Myocardial infarction ([place]) 1994  . Osteoarthritis   . Paroxysmal atrial fibrillation (HCC)   . Peripheral vascular disease ([facility])   . Personal history of transient ischemic attack (TIA) and cerebral infarction without residual deficit   . Right pontine stroke ([facility]) 02/27/2015  . Tooth disease    Past Surgical History:  Procedure Laterality Date  . CARDIAC CATHETERIZATION  07/21/2005  . CORONARY ARTERY BYPASS GRAFT N/A 08/07/2017   Procedure: CORONARY ARTERY BYPASS GRAFTING (CABG) x 2 WITH ENDOSCOPIC HARVESTING OF RIGHT SAPHENOUS VEIN.;  Surgeon: Stanley Poot, MD;  Location: [facility];  Service: Open Heart Surgery;  Laterality: N/A;  . HERNIA REPAIR  12/26/2002   BILATERAL INGUINAL HERNIA REPAIR  . LEFT HEART CATH AND CORONARY ANGIOGRAPHY N/A 08/03/2017   Procedure: LEFT HEART CATH AND CORONARY ANGIOGRAPHY;  Surgeon: Stanley Mocha, MD;  Location: [facility]  CV [facility];  Service: Cardiovascular;  Laterality: N/A;  . MASTOID SURGERY X 5    . PROSTATE SURGERY    . TEE WITHOUT CARDIOVERSION N/A 08/07/2017   Procedure: TRANSESOPHAGEAL ECHOCARDIOGRAM (TEE);  Surgeon: Stanley Robles, Stanley Salina, MD;  Location: [facility];  Service: Open Heart Surgery;  Laterality: N/A;  . TOTAL KNEE ARTHROPLASTY Left 03/28/2013   Procedure: LEFT TOTAL KNEE ARTHROPLASTY;  Surgeon: Gearlean Alf, MD;  Location: [facility];  Service: Orthopedics;  Laterality: Left;    Allergies  Allergen  Reactions  . Lipitor [Atorvastatin] Other (See Comments)    Severe constipation    Outpatient Encounter Prescriptions as of 09/03/2017  Medication Sig  . acetaminophen (TYLENOL) 500 MG tablet Take 1,000 mg by mouth every 8 (eight) hours as needed.   Marland Kitchen allopurinol (ZYLOPRIM) 300 MG tablet Take 0.5 tablets (150 mg total) by mouth daily.  Marland Kitchen amiodarone (PACERONE) 200 MG tablet Take 1 tablet (200 mg total) by mouth daily.  Marland Kitchen aspirin EC 81 MG EC tablet Take 1 tablet (81 mg total) by mouth daily.  . Cholecalciferol (VITAMIN D3) 2000 units TABS Take 2,000 Units by mouth daily.  Marland Kitchen docusate sodium (COLACE) 100 MG capsule Take 100 mg by mouth daily.  . feeding supplement, ENSURE ENLIVE, (ENSURE ENLIVE) LIQD Take 237 mLs by mouth 2 (two) times daily between meals.  . finasteride (PROSCAR) 5 MG tablet Take 5 mg by mouth daily.  Marland Kitchen HYDROcodone-acetaminophen (NORCO/VICODIN) 5-325 MG tablet Take 1 tablet by mouth every 6 (six) hours as needed for moderate pain.  Marland Kitchen omeprazole (PRILOSEC) 20 MG capsule Take 20 mg by mouth daily.  . ondansetron (ZOFRAN) 4 MG tablet Take 4 mg by mouth every 8 (eight) hours as needed for nausea or vomiting.  . rosuvastatin (CRESTOR) 10 MG tablet Take 1 tablet (10 mg total) by mouth daily.  Marland Kitchen warfarin (COUMADIN) 1 MG tablet Take 1 tablet (1 mg total) by mouth daily at 6 PM.  . [DISCONTINUED] metoprolol tartrate (LOPRESSOR) 25 MG tablet Take 0.5 tablets (12.5 mg total) by mouth 2 (two) times daily.   No facility-administered encounter medications on file as of 09/03/2017.      Review of Systems  Constitutional: Positive for activity change, appetite change and fever.  HENT: Negative.   Respiratory: Positive for apnea, cough and shortness of breath.   Cardiovascular: Negative.   Gastrointestinal: Positive for nausea.  Genitourinary: Negative.   Musculoskeletal: Negative.   Skin: Negative.   Neurological: Negative.   All other systems reviewed and are negative.    There  is no immunization history on file for this patient. Pertinent  Health Maintenance Due  Topic Date Due  . PNA vac Low Risk Adult (1 of 2 - PCV13) 10/20/1999  . INFLUENZA VACCINE  06/24/2017   Fall Risk  10/27/2016 10/30/2015  Falls in the past year? No No   Functional Status Survey:    Vitals:   09/03/17 1022  BP: 111/64  Pulse: (!) 101  Resp: 20  Temp: 99.3 F (37.4 C)  TempSrc: Oral  SpO2: 96%   There is no height or weight on file to calculate BMI. Physical Exam  Constitutional: He is oriented to person, place, and time. He appears well-developed and well-nourished.  HENT:  Head: Normocephalic.  Mouth/Throat: Oropharynx is clear and moist.  Eyes: Pupils are equal, round, and reactive to light.  Neck: Neck supple.  Cardiovascular: Normal rate and normal heart sounds.  An irregular rhythm present.  Pulmonary/Chest: Effort normal and breath  sounds normal. No respiratory distress. He has no wheezes. He has no rales.  Abdominal: Soft. Bowel sounds are normal. He exhibits no distension. There is no tenderness. There is no rebound.  Musculoskeletal: He exhibits no edema.  Neurological: He is alert and oriented to person, place, and time.  Skin: Skin is warm and dry.  Psychiatric: He has a normal mood and affect. His behavior is normal.    Labs reviewed:  Recent Labs  08/17/17 0501  08/18/17 0355  08/19/17 0332  08/26/17 0504 08/27/17 0452 08/28/17 1620 08/31/17 0630  NA 133*  < > 131*  < > 130*  < > 138 142 146* 151*  K 3.9  < > 3.3*  < > 3.5  < > 3.6 3.7 4.2 4.2  CL 101  < > 97*  < > 96*  < > 101 106 115* 116*  CO2 22  < > 21*  --  20*  < > 22 22 23 25   GLUCOSE 126*  < > 117*  < > 103*  < > 107* 103* 113* 109*  BUN 22*  < > 46*  < > 65*  < > 100* 100* 84* 75*  CREATININE 1.42*  < > 2.73*  < > 3.77*  < > 5.17* 4.79* 3.90* 3.56*  CALCIUM 7.5*  < > 8.0*  --  8.3*  < > 9.5 9.2 9.2 9.6  MG 1.8  --  1.9  --  1.9  --   --   --   --   --   PHOS 2.0*  < > 5.4*  --  5.6*   < > 5.3* 4.8* 4.1  --   < > = values in this interval not displayed.  Recent Labs  08/22/17 0612 08/23/17 0343 08/24/17 0416  08/26/17 0504 08/27/17 0452 08/28/17 1620  AST 24 20 18   --   --   --   --   ALT 31 28 25   --   --   --   --   ALKPHOS 77 72 65  --   --   --   --   BILITOT 0.7 0.8 1.1  --   --   --   --   PROT 5.9* 6.0* 6.2*  --   --   --   --   ALBUMIN 2.4* 2.4* 2.5*  < > 2.4* 2.3* 2.3*  < > = values in this interval not displayed.  Recent Labs  08/14/17 0259 08/17/17 0501  08/25/17 0334 08/26/17 0504 08/31/17 0630  WBC 7.9 13.1*  < > 7.1 6.5 6.3  NEUTROABS 6.2 10.0*  --   --   --  4.8  HGB 9.2* 11.1*  < > 9.4* 8.9* 11.0*  HCT 26.9* 33.2*  < > 28.7* 27.0* 35.0*  MCV 97.8 99.4  < > 100.3* 101.1* 109.4*  PLT 108* 162  < > 164 160 158  < > = values in this interval not displayed. Lab Results  Component Value Date   TSH 3.035 08/04/2017   Lab Results  Component Value Date   HGBA1C 5.3 08/04/2017   Lab Results  Component Value Date   CHOL 162 02/28/2015   HDL 31 (L) 02/28/2015   LDLCALC 102 (H) 02/28/2015   TRIG 104 08/17/2017   CHOLHDL 5.2 02/28/2015    Significant Diagnostic Results in last 30 days:  Dg Chest 2 View  Result Date: 08/26/2017 CLINICAL DATA:  Status post CABG on August 07, 2017 EXAM: CHEST  2 VIEW COMPARISON:  Portable chest x-ray of August 21, 2017. FINDINGS: The lungs are adequately inflated. There is persistent increased density in the retrocardiac region. There is no pneumothorax or significant pleural effusion. The cardiac silhouette is top-normal in size. The pulmonary vascularity is not engorged. The sternal wires are intact. The left PICC line tip projects over the midportion of the SVC. The bony thorax exhibits no acute abnormality. IMPRESSION: Persistent left lower lobe atelectasis or pneumonia. No significant pulmonary edema or pleural effusion. Electronically Signed   By: David  Martinique M.D.   On: 08/26/2017 07:45   US  Renal  Result Date: 08/12/2017 CLINICAL DATA:  Acute kidney injury.  Open heart surgery 08/07/2017 EXAM: RENAL / URINARY TRACT ULTRASOUND COMPLETE COMPARISON:  CT abdomen and pelvis 07/07/2013 FINDINGS: Right Kidney: Length: 10.2 cm. Diffuse renal parenchymal thinning with increased parenchymal echotexture consistent with chronic medical renal disease. No solid mass, stone, or hydronephrosis identified. Left Kidney: Length: 8.5 cm. Diffuse renal parenchymal thinning with increased parenchymal echotexture consistent with chronic medical renal disease. Multiple cysts are demonstrated, largest measuring 8.2 cm in maximal diameter. No solid masses seen. No hydronephrosis. Visualization of the left kidney is limited due to poor penetration. Bladder: Bladder is decompressed with a Foley catheter and is not evaluated. IMPRESSION: Bilateral renal parenchymal atrophy and increased parenchymal echotexture consistent with chronic medical renal disease. No hydronephrosis. Benign-appearing left renal cysts. Electronically Signed   By: Lucienne Capers M.D.   On: 08/12/2017 22:35   Dg Chest Port 1 View  Result Date: 08/21/2017 CLINICAL DATA:  CABG. EXAM: PORTABLE CHEST 1 VIEW COMPARISON:  08/20/2017 . FINDINGS: Dual lumen left subclavian central line in stable position. Prior CABG. Cardiomegaly with normal pulmonary vascularity. Bibasilar atelectasis. Improved bibasilar infiltrates/edema. Tiny bilateral pleural effusions. No pneumothorax. Surgical staples and clips right chest. IMPRESSION: 1. Dual-lumen left subclavian central line stable position. 2. Prior CABG. Cardiomegaly with normal pulmonary vascularity. Improved bibasilar infiltrates/edema. Findings most consistent improving CHF. Tiny bilateral pleural effusions. 3.  Bibasilar atelectasis . Electronically Signed   By: Marcello Moores  Register   On: 08/21/2017 07:44   Dg Chest Port 1 View  Result Date: 08/20/2017 CLINICAL DATA:  Shortness of breath.  Status post CABG 13  days ago. EXAM: PORTABLE CHEST 1 VIEW COMPARISON:  Portable chest x-ray of August 18, 2017 FINDINGS: The lungs are adequately inflated. There remain increased densities at the right lung base and in the left mid and lower lung. There is small left pleural effusion. The cardiac silhouette is enlarged. The central pulmonary vascularity is mildly prominent but stable. The dual-lumen dialysis catheter is in stable position. The PICC line tip lies in the distal portion of the SVC. There are calcifications in the wall of the thoracic aorta. The visualized sternal wires are intact. IMPRESSION: Persistent left lower lobe and right lower lobe atelectasis or pneumonia. Small left pleural effusion. Mild central pulmonary vascular congestion. Electronically Signed   By: David  Martinique M.D.   On: 08/20/2017 07:55   Dg Chest Port 1 View  Result Date: 08/18/2017 CLINICAL DATA:  Shortness of breath.  CABG. EXAM: PORTABLE CHEST 1 VIEW COMPARISON:  08/16/2017. FINDINGS: Left subclavian line left PICC line in stable position. Prior CABG. Stable cardiomegaly. Improvement of basilar interstitial prominence consistent improving CHF. Mild bibasilar atelectasis . No pleural effusion or pneumothorax. Surgical staples and clips right chest. IMPRESSION: 1.  Left subclavian line and left PICC line in stable position. 2. Prior CABG. Stable cardiomegaly. Improving bilateral interstitial prominence consistent  improving CHF. 3. Mild basilar atelectasis. Electronically Signed   By: Marcello Moores  Register   On: 08/18/2017 07:15   Dg Chest Port 1 View  Result Date: 08/16/2017 CLINICAL DATA:  Congestive heart failure EXAM: PORTABLE CHEST 1 VIEW COMPARISON:  08/15/2017 FINDINGS: Mild improvement in congestive heart failure with edema. Bilateral effusions left greater than right unchanged. Bibasilar atelectasis left greater than right unchanged. Left arm PICC tip in the lower SVC unchanged. Left subclavian dual lumen central venous catheter tip in  the left innominate vein unchanged. IMPRESSION: Mild improvement in congestive heart failure and edema. Bibasilar atelectasis and effusion left greater than right Electronically Signed   By: Franchot Gallo M.D.   On: 08/16/2017 14:48   Dg Chest Port 1 View  Result Date: 08/15/2017 CLINICAL DATA:  Status post coronary bypass grafting EXAM: PORTABLE CHEST 1 VIEW COMPARISON:  08/14/2017 FINDINGS: Cardiac shadow remains enlarged. Postsurgical changes are again seen. Aortic calcifications are noted. PICC line is noted in the superior aspect of the right atrium stable from the prior exam. The right lung is clear improved from the prior exam. Stable left retrocardiac atelectasis is noted. No pneumothorax is seen. Left subclavian central line is noted as well. IMPRESSION: Stable left retrocardiac atelectasis. Electronically Signed   By: Inez Catalina M.D.   On: 08/15/2017 08:36   Dg Chest Port 1 View  Result Date: 08/14/2017 CLINICAL DATA:  Sore chest after cardiac surgery EXAM: PORTABLE CHEST 1 VIEW COMPARISON:  Yesterday FINDINGS: Low volume chest with hazy opacity from layering effusions. There is vascular congestion and basal atelectasis. No pneumothorax visible. Stable cardiomegaly. Left upper extremity PICC with tip at the distal SVC. IMPRESSION: 1. Stable from yesterday. 2. Low volume chest with atelectasis, effusions, and vascular congestion. Electronically Signed   By: Monte Fantasia M.D.   On: 08/14/2017 08:05   Dg Chest Port 1 View  Result Date: 08/13/2017 CLINICAL DATA:  Ileus, CABG EXAM: PORTABLE CHEST 1 VIEW COMPARISON:  08/12/2017 FINDINGS: Changes of CABG. Left dialysis catheter remains in place, unchanged. Cardiomegaly with vascular congestion and mild pulmonary edema. Small layering effusions. IMPRESSION: Mild CHF with small layering effusions. Findings similar to prior study. Electronically Signed   By: Rolm Baptise M.D.   On: 08/13/2017 09:15   Dg Chest Port 1 View  Result Date:  08/12/2017 CLINICAL DATA:  Central line placement EXAM: PORTABLE CHEST 1 VIEW COMPARISON:  08/12/2017 FINDINGS: Left upper extremity catheter tip overlies the SVC. Larger poor left central venous catheter tip poorly visible, appears to overlie the left brachiocephalic region. No pneumothorax is seen. Post sternotomy changes and cutaneous staples over the right chest. Surgical clips in the right axilla. Worsened bibasilar opacification. Cardiomegaly with aortic atherosclerosis. IMPRESSION: 1. Left-sided central venous catheter tip poorly visible, appears to project over the left brachiocephalic region. No pneumothorax. 2. Cardiomegaly with increased vascular congestion and background edema 3. Worsening airspace disease within the bilateral lung bases. Electronically Signed   By: Donavan Foil M.D.   On: 08/12/2017 19:07   Dg Chest Port 1 View  Result Date: 08/12/2017 CLINICAL DATA:  CABG, chest soreness EXAM: PORTABLE CHEST 1 VIEW COMPARISON:  08/11/2017 FINDINGS: Prior CABG. Left PICC line is unchanged. Cardiomegaly with vascular congestion and mild pulmonary edema. Small bilateral effusions suspected. No pneumothorax. IMPRESSION: Continued CHF pattern.  Small bilateral effusions. Electronically Signed   By: Rolm Baptise M.D.   On: 08/12/2017 08:45   Dg Chest Port 1 View  Result Date: 08/11/2017 CLINICAL DATA:  Chest  pain following coronary bypass grafting EXAM: PORTABLE CHEST 1 VIEW COMPARISON:  08/10/2017 FINDINGS: Cardiac shadow remains enlarged. Left-sided PICC line is again seen. Right jugular sheath has been removed. Postoperative changes are again noted. Mild vascular congestion and interstitial edema is seen stable from the prior exam. Likely small pleural effusions remain. No new focal infiltrate is seen. IMPRESSION: Changes of mild CHF. Electronically Signed   By: Inez Catalina M.D.   On: 08/11/2017 07:48   Dg Chest Port 1 View  Result Date: 08/10/2017 CLINICAL DATA:  Left PICC catheter  placement. EXAM: PORTABLE CHEST 1 VIEW COMPARISON:  Chest x-ray from same date. FINDINGS: Normal insertion of a left-sided PICC line with the tip projecting near the cavoatrial junction. Unchanged right internal jugular sheath. Stable cardiomegaly with pulmonary vascular congestion and pulmonary edema. Small bilateral pleural effusions are unchanged. No pneumothorax. No acute osseous abnormality. IMPRESSION: 1. Interval placement of a left-sided PICC line with the tip projecting over the cavoatrial junction. 2. Unchanged cardiomegaly with pulmonary edema and small bilateral pleural effusions. Electronically Signed   By: Titus Dubin M.D.   On: 08/10/2017 13:34   Dg Chest Port 1 View  Result Date: 08/10/2017 CLINICAL DATA:  Status post coronary artery bypass graft. EXAM: PORTABLE CHEST 1 VIEW COMPARISON:  Radiograph of August 09, 2017. FINDINGS: Stable cardiomegaly. Sternotomy wires are noted. Atherosclerosis of thoracic aorta is noted. No pneumothorax is noted. Left-sided chest tube has been removed. Bilateral perihilar and basilar opacities are noted concerning for pulmonary edema with mild associated pleural effusions. Bony thorax is unremarkable. IMPRESSION: Left-sided chest tube has been removed without evidence of pneumothorax. Aortic atherosclerosis. Bilateral pulmonary edema is noted with mild bilateral pleural effusions. Electronically Signed   By: Marijo Conception, M.D.   On: 08/10/2017 07:37   Dg Chest Port 1 View  Result Date: 08/09/2017 CLINICAL DATA:  Follow-up CABG EXAM: PORTABLE CHEST 1 VIEW COMPARISON:  08/08/2017 FINDINGS: Swan-Ganz catheter is been removed. Venous access sheath remains in place. Left chest tube remains in place. No pneumothorax. Slight worsening of bilateral effusions and lower lobe atelectatic changes. IMPRESSION: Slight worsening of bilateral effusions and lower lobe atelectasis. Electronically Signed   By: Nelson Chimes M.D.   On: 08/09/2017 08:57   Dg Chest Port 1  View  Result Date: 08/08/2017 CLINICAL DATA:  Followup CABG EXAM: PORTABLE CHEST 1 VIEW COMPARISON:  08/07/2017 FINDINGS: Endotracheal tube is been removed. Left chest tube remains in place. Swan-Ganz catheter remains in place. No pneumothorax. Persistent volume loss in both lower lobes left worse than right. Upper lungs are clear. IMPRESSION: No pneumothorax. Persistent lower lobe volume loss left worse than right. Extubated. Electronically Signed   By: Nelson Chimes M.D.   On: 08/08/2017 07:54   Dg Chest Port 1 View  Result Date: 08/07/2017 CLINICAL DATA:  Status post cardiac surgery. EXAM: PORTABLE CHEST 1 VIEW COMPARISON:  Radiographs of July 31, 2017. FINDINGS: Stable cardiomegaly with mild central pulmonary vascular congestion. Atherosclerosis of thoracic aorta is noted. Endotracheal tube is seen projected over tracheal air shadow with distal tip 5 cm above the carina. Right internal jugular Swan-Ganz catheter is noted with distal tip in expected position of main pulmonary artery. Left-sided chest tube is noted without pneumothorax. Mild bibasilar subsegmental atelectasis is noted. Right axillae surgical clips and staples are noted. IMPRESSION: Endotracheal tube in grossly good position. Left-sided chest tube is noted without pneumothorax. Mild central pulmonary vascular congestion. Mild bibasilar subsegmental atelectasis. Electronically Signed   By: Jeneen Rinks  Murlean Caller, M.D.   On: 08/07/2017 13:52   Dg Abd Portable 1v  Result Date: 08/15/2017 CLINICAL DATA:  Follow-up ileus EXAM: PORTABLE ABDOMEN - 1 VIEW COMPARISON:  08/14/2017 FINDINGS: Scattered large and small bowel gas is noted. No obstructive changes are seen. Contrast material is noted throughout the colon stable from the prior exam. Diverticulosis without findings of diverticulitis is noted. No free air is seen. No obstructive changes are noted. IMPRESSION: No acute abnormality noted. No findings to suggest obstructive change are seen.  Electronically Signed   By: Inez Catalina M.D.   On: 08/15/2017 08:35   Dg Abd Portable 1v  Result Date: 08/14/2017 CLINICAL DATA:  Ileus EXAM: PORTABLE ABDOMEN - 1 VIEW COMPARISON:  August 13, 2017 FINDINGS: There is contrast in the colon. There are multiple contrast filled colonic diverticulum. There is no bowel dilatation or air-fluid level to suggest bowel obstruction. No free air. Consolidation noted in left lung base. Ovary pacemaker wires are attached to the right heart. IMPRESSION: No bowel dilatation or air-fluid level to suggest ileus or bowel obstruction. No free air. Multiple colonic filled diverticulum. Persistent left lower lobe consolidation. Right lung base appears clear. Pacemaker wires attached to right heart. Electronically Signed   By: Lowella Grip III M.D.   On: 08/14/2017 08:02   Dg Abd Portable 1v  Result Date: 08/13/2017 CLINICAL DATA:  Ileus EXAM: PORTABLE ABDOMEN - 1 VIEW COMPARISON:  08/11/2017 FINDINGS: Contrast noted throughout nondistended transverse colon and left colon. Continued gaseous distention of the cecum and ascending colon, stable. Contrast noted within sigmoid diverticula. No small bowel distention. No free air. IMPRESSION: Continued moderate gaseous distention of the right colon. Remainder the colon is decompressed with contrast throughout. Filling of sigmoid diverticula. Electronically Signed   By: Rolm Baptise M.D.   On: 08/13/2017 09:14   Dg Abd Portable 1v  Result Date: 08/11/2017 CLINICAL DATA:  Evaluate for ileus EXAM: PORTABLE ABDOMEN - 1 VIEW COMPARISON:  None. FINDINGS: Contrast is seen in the colon. The transverse and descending colon are decompressed. The ascending colon is mildly prominent. There is a paucity of small bowel gas but no definitive dilatation. Linear densities projected over the right side of the abdomen may be on or in patient. Recommend clinical correlation. No other acute abnormalities. IMPRESSION: 1. The ascending colon is  mildly prominent with decompression of the transverse and descending colon. This is a nonspecific bowel gas pattern. 2. Linear densities projected over the right side of the abdomen may be on or in the patient. Recommend clinical correlation. Electronically Signed   By: Dorise Bullion III M.D   On: 08/11/2017 18:12   Dg Swallowing Func-speech Pathology  Result Date: 08/27/2017 Objective Swallowing Evaluation: Type of Study: MBS-Modified Barium Swallow Study Patient Details Name: Stanley Robles MRN: [id number] Date of Birth: [birth year] Today's Date: 08/27/2017 Time: SLP Start Time (ACUTE ONLY): 1120-SLP Stop Time (ACUTE ONLY): 1140 SLP Time Calculation (min) (ACUTE ONLY): 20 min Past Medical History: Past Medical History: Diagnosis Date . BPH (benign prostatic hypertrophy)  . Cancer ([place])   CANCEROUS MOLE REMOVED FROM BACK - SEVERAL YRS AGO . Chronic kidney disease stage 3  . 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 GERD (gastroesophageal reflux disease)  . Gout  . Hyperlipidemia  . Hypertensive heart disease  . Incontinence of urine  . Memory loss  . Myocardial infarction ([place]) 1994 . Osteoarthritis  . Paroxysmal atrial fibrillation (HCC)  . Peripheral vascular disease ([place])  .  Personal history of transient ischemic attack (TIA) and cerebral infarction without residual deficit  . Right pontine stroke ([place]) 02/27/2015 . Tooth disease  Past Surgical History: Past Surgical History: Procedure Laterality Date . CARDIAC CATHETERIZATION  07/21/2005 . CORONARY ARTERY BYPASS GRAFT N/A 08/07/2017  Procedure: CORONARY ARTERY BYPASS GRAFTING (CABG) x 2 WITH ENDOSCOPIC HARVESTING OF RIGHT SAPHENOUS VEIN.;  Surgeon: Stanley Poot, MD;  Location: [facility];  Service: Open Heart Surgery;  Laterality: N/A; . HERNIA REPAIR  12/26/2002  BILATERAL INGUINAL HERNIA REPAIR . LEFT HEART CATH AND CORONARY ANGIOGRAPHY N/A 08/03/2017  Procedure: LEFT HEART CATH AND CORONARY ANGIOGRAPHY;  Surgeon: Stanley Mocha, MD;  Location: [place] [facility];  Service: Cardiovascular;  Laterality: N/A; . MASTOID SURGERY X 5   . PROSTATE SURGERY   . TEE WITHOUT CARDIOVERSION N/A 08/07/2017  Procedure: TRANSESOPHAGEAL ECHOCARDIOGRAM (TEE);  Surgeon: Stanley Robles, Stanley Salina, MD;  Location: [place];  Service: Open Heart Surgery;  Laterality: N/A; . TOTAL KNEE ARTHROPLASTY Left 03/28/2013  Procedure: LEFT TOTAL KNEE ARTHROPLASTY;  Surgeon: Gearlean Alf, MD;  Location: [facility];  Service: Orthopedics;  Laterality: Left; HPI: Pt is an 83 yo male admitted with NSTEMI s/p cath which showed severe left main disease and total right coronary artery stenosis, potential CABG planned for 9/14. 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 Was evaluated by ST this admission prior to CABG; MBS 08/05/17 revealed mild oropharyngeal and cervical esophageal dysphagia, suspected to be more chronic in nature given his h/o dysphagia from prior CVA and h/o GERD. Dys 3, thin liquids, no straws recommended with ST to follow briefly through surgery given his history. Pt was intubated for CABG on 9/14, transported back to floor on vent and subsequent extubated at 10pm same day. Pt was consuming Dys 2, nectar > developed ileus > NPO and diagnostic reassessment this morning.   Subjective: RN reports pt coughing with PO s/p CABG Assessment / Plan / Recommendation CH[facility] IP CLINICAL IMPRESSIONS 08/27/2017 Clinical Impression Patient with a mild motor based oropharyngeal dysphagia. Patient with oral holding of bolus at the base of tongue in preparation for swallow, at times in excess of 5 seconds, however swallow initiated either at or above the level of the vallecula with all solid and nectar thick liquid consistencies. With thin liquids however, swallow is delayed to the level of the pyriform sinuses which when combined with decreased laryngeal closure, results in deep silent penetration to the vocal  cords. although occurring in only 25-30% of trials, cued cough weak and unsuccessful to clear hte airway resulting in a significant risk of post swallow aspiration. Given decreased functional reserve with acute illness, recommend dysphagia 3 solids with nectar thick liquids. Prognosis for ability to advance diet good with improved medical status and conditioning. Will f/u.  SLP Visit Diagnosis Dysphagia, unspecified (R13.10) Attention and concentration deficit following -- Frontal lobe and executive function deficit following -- Impact on safety and function Moderate aspiration risk   CH[facility] IP TREATMENT RECOMMENDATION 08/27/2017 Treatment Recommendations Therapy as outlined in treatment plan below   Prognosis 08/27/2017 Prognosis for Safe Diet Advancement Good Barriers to Reach Goals -- Barriers/Prognosis Comment -- [facility] IP DIET RECOMMENDATION 08/27/2017 SLP Diet Recommendations Dysphagia 3 (Mech soft) solids;Nectar thick liquid Liquid Administration via Cup;No straw Medication Administration Whole meds with puree Compensations Slow rate;Small sips/bites Postural Changes Seated upright at 90 degrees   CHL IP OTHER RECOMMENDATIONS 08/27/2017 Recommended Consults --  Oral Care Recommendations Oral care BID Other Recommendations Order thickener from pharmacy;Prohibited food (jello, ice cream, thin soups);Remove water pitcher   [facility] IP FOLLOW UP RECOMMENDATIONS 08/27/2017 Follow up Recommendations Skilled Nursing facility   [facility] IP FREQUENCY AND DURATION 08/27/2017 Speech Therapy Frequency (ACUTE ONLY) min 2x/week Treatment Duration 2 weeks      CHL IP ORAL PHASE 08/27/2017 Oral Phase Impaired Oral - Pudding Teaspoon -- Oral - Pudding Cup -- Oral - Honey Teaspoon -- Oral - Honey Cup -- Oral - Nectar Teaspoon Delayed oral transit;Lingual pumping;Holding of bolus Oral - Nectar Cup Delayed oral transit;Lingual pumping;Holding of bolus Oral - Nectar Straw -- Oral - Thin Teaspoon -- Oral - Thin Cup Delayed oral transit;Lingual  pumping;Holding of bolus Oral - Thin Straw Delayed oral transit;Lingual pumping;Holding of bolus Oral - Puree Delayed oral transit;Lingual pumping;Holding of bolus Oral - Mech Soft Delayed oral transit;Lingual pumping;Holding of bolus Oral - Regular -- Oral - Multi-Consistency -- Oral - Pill NT Oral Phase - Comment --  CHL IP PHARYNGEAL PHASE 08/27/2017 Pharyngeal Phase Impaired Pharyngeal- Pudding Teaspoon -- Pharyngeal -- Pharyngeal- Pudding Cup -- Pharyngeal -- Pharyngeal- Honey Teaspoon -- Pharyngeal -- Pharyngeal- Honey Cup -- Pharyngeal -- Pharyngeal- Nectar Teaspoon Delayed swallow initiation-vallecula;Reduced tongue base retraction;Pharyngeal residue - valleculae Pharyngeal -- Pharyngeal- Nectar Cup Delayed swallow initiation-vallecula;Reduced tongue base retraction;Pharyngeal residue - valleculae Pharyngeal -- Pharyngeal- Nectar Straw -- Pharyngeal -- Pharyngeal- Thin Teaspoon Delayed swallow initiation-pyriform sinuses;Reduced tongue base retraction;Pharyngeal residue - valleculae;Reduced anterior laryngeal mobility Pharyngeal -- Pharyngeal- Thin Cup Delayed swallow initiation-pyriform sinuses;Penetration/Aspiration during swallow;Reduced tongue base retraction;Pharyngeal residue - valleculae;Reduced anterior laryngeal mobility Pharyngeal Material enters airway, CONTACTS cords and not ejected out Pharyngeal- Thin Straw Delayed swallow initiation-pyriform sinuses;Reduced tongue base retraction;Reduced anterior laryngeal mobility;Penetration/Aspiration during swallow;Pharyngeal residue - valleculae Pharyngeal Material enters airway, CONTACTS cords and not ejected out Pharyngeal- Puree Delayed swallow initiation-vallecula;Reduced tongue base retraction;Pharyngeal residue - valleculae Pharyngeal -- Pharyngeal- Mechanical Soft Delayed swallow initiation-vallecula;Reduced tongue base retraction;Pharyngeal residue - valleculae Pharyngeal -- Pharyngeal- Regular -- Pharyngeal -- Pharyngeal- Multi-consistency --  Pharyngeal -- Pharyngeal- Pill NT Pharyngeal -- Pharyngeal Comment --  CHL IP CERVICAL ESOPHAGEAL PHASE 08/27/2017 Cervical Esophageal Phase WFL Pudding Teaspoon -- Pudding Cup -- Honey Teaspoon -- Honey Cup -- Nectar Teaspoon -- Nectar Cup -- Nectar Straw -- Thin Teaspoon -- Thin Cup -- Thin Straw -- Puree -- Mechanical Soft -- Regular -- Multi-consistency -- Pill -- Cervical Esophageal Comment -- CHL IP GO 03/28/2015 Functional Assessment Tool Used MBSS Functional Limitations Swallowing Swallow Current Status (O5366) CI Swallow Goal Status (Y4034) CI Swallow Discharge Status (V4259) CI Motor Speech Current Status (D6387) (None) Motor Speech Goal Status (F6433) (None) Motor Speech Goal Status (I9518) (None) Spoken Language Comprehension Current Status (A4166) (None) Spoken Language Comprehension Goal Status (A6301) (None) Spoken Language Comprehension Discharge Status (S0109) (None) Spoken Language Expression Current Status (N2355) (None) Spoken Language Expression Goal Status (D3220) (None) Spoken Language Expression Discharge Status [id number] (None) Attention Current Status (C6237) (None) Attention Goal Status (S2831) (None) Attention Discharge Status (D1761) (None) Memory Current Status (Y0737) (None) Memory Goal Status (T0626) (None) Memory Discharge Status (R4854) (None) Voice Current Status (O2703) (None) Voice Goal Status (J0093) (None) Voice Discharge Status (G1829) (None) Other Speech-Language Pathology Functional Limitation Current Status (H3716) (None) Other Speech-Language Pathology Functional Limitation Goal Status (R6789) (None) Other Speech-Language Pathology Functional Limitation Discharge Status [phone]) (None) Gabriel Rainwater MA, CCC-SLP [phone] McCoy Leah Meryl 08/27/2017, 1:55 PM              Dg  Swallowing Func-speech Pathology  Result Date: 08/05/2017 Objective Swallowing Evaluation: Type of Study: MBS-Modified Barium Swallow Study Patient Details Name: Stanley Robles MRN: [id number] Date of Birth:  14-Nov-1932 Today's Date: 08/05/2017 Time: SLP Start Time (ACUTE ONLY): 1411-SLP Stop Time (ACUTE ONLY): 1426 SLP Time Calculation (min) (ACUTE ONLY): 15 min Past Medical History: Past Medical History: Diagnosis Date . BPH (benign prostatic hypertrophy)  . Cancer ([place])   CANCEROUS MOLE REMOVED FROM BACK - SEVERAL YRS AGO . Chronic kidney disease stage 3  . 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 GERD (gastroesophageal reflux disease)  . Gout  . Hyperlipidemia  . Hypertensive heart disease  . Incontinence of urine  . Memory loss  . Myocardial infarction ([place]) 1994 . Osteoarthritis  . Paroxysmal atrial fibrillation (HCC)  . Peripheral vascular disease ([place])  . Personal history of transient ischemic attack (TIA) and cerebral infarction without residual deficit  . Right pontine stroke ([place]) 02/27/2015 . Tooth disease  Past Surgical History: Past Surgical History: Procedure Laterality Date . CARDIAC CATHETERIZATION  07/21/2005 . HERNIA REPAIR  12/26/2002  BILATERAL INGUINAL HERNIA REPAIR . LEFT HEART CATH AND CORONARY ANGIOGRAPHY N/A 08/03/2017  Procedure: LEFT HEART CATH AND CORONARY ANGIOGRAPHY;  Surgeon: Stanley Mocha, MD;  Location: [place] [facility];  Service: Cardiovascular;  Laterality: N/A; . MASTOID SURGERY X 5   . PROSTATE SURGERY   . TOTAL KNEE ARTHROPLASTY Left 03/28/2013  Procedure: LEFT TOTAL KNEE ARTHROPLASTY;  Surgeon: Gearlean Alf, MD;  Location: [facility];  Service: Orthopedics;  Laterality: Left; HPI: Pt is an 83 yo male admitted with NSTEMI s/p cath which showed severe left main disease and total right coronary artery stenosis, potential CABG planned for 9/14. 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 Most recent MBS in 2016 recommended regular diet and thin liquids with chin tuck and small sips to reduce episodes of aspiration. Pt and his dtr say that he has not been using a chin tuck  at home anymore but that he also has not had PNA. Subjective: pt says he coughs a lot during PO intake but cannot determine why Assessment / Plan / Recommendation CHL IP CLINICAL IMPRESSIONS 08/05/2017 Clinical Impression Pt has a mild oropharyngeal and cervical esophageal dysphagia, suspected to be more chronic in nature given his h/o dysphagia from prior CVA and h/o GERD. He has intermittent premature spillage of thin liquids that is mostly seen when he takes larger, consecutive boluses. There was one instance in which a small amount of thin liquid was silently spilled directly into his airway before the swallow when taking sequential straw sips. No other aspiration was observed, but would suspect that coughing episodes observed clinically may be related to larger volumes of aspiration. Pt also had reduced relaxation of the UES that allowed for small amounts of backflow into the pharynx, but nothing that compromised the airway. Recommend to continue current Dys 3 diet and thin liquids with emphasis no single cup sips and avoiding mixed consistencies. Will continue to follow pt through his surgery.  SLP Visit Diagnosis Dysphagia, oropharyngeal phase (R13.12);Dysphagia, pharyngoesophageal phase (R13.14) Attention and concentration deficit following -- Frontal lobe and executive function deficit following -- Impact on safety and function Mild aspiration risk;Moderate aspiration risk   CHL IP TREATMENT RECOMMENDATION 08/05/2017 Treatment Recommendations Therapy as outlined in treatment plan below   Prognosis 08/05/2017 Prognosis for Safe Diet Advancement Good Barriers to Reach  Goals -- Barriers/Prognosis Comment -- CHL IP DIET RECOMMENDATION 08/05/2017 SLP Diet Recommendations Dysphagia 3 (Mech soft) solids;Thin liquid Liquid Administration via Cup;No straw Medication Administration Whole meds with puree Compensations Slow rate;Small sips/bites;Follow solids with liquid Postural Changes Remain semi-upright after after  feeds/meals (Comment);Seated upright at 90 degrees   CHL IP OTHER RECOMMENDATIONS 08/05/2017 Recommended Consults -- Oral Care Recommendations Oral care BID Other Recommendations --   CHL IP FOLLOW UP RECOMMENDATIONS 08/05/2017 Follow up Recommendations (No Data)   CHL IP FREQUENCY AND DURATION 08/05/2017 Speech Therapy Frequency (ACUTE ONLY) min 2x/week Treatment Duration 2 weeks      CHL IP ORAL PHASE 08/05/2017 Oral Phase Impaired Oral - Pudding Teaspoon -- Oral - Pudding Cup -- Oral - Honey Teaspoon -- Oral - Honey Cup -- Oral - Nectar Teaspoon -- Oral - Nectar Cup -- Oral - Nectar Straw -- Oral - Thin Teaspoon -- Oral - Thin Cup Premature spillage Oral - Thin Straw Premature spillage Oral - Puree WFL Oral - Mech Soft Impaired mastication Oral - Regular -- Oral - Multi-Consistency -- Oral - Pill WFL Oral Phase - Comment --  CHL IP PHARYNGEAL PHASE 08/05/2017 Pharyngeal Phase Impaired Pharyngeal- Pudding Teaspoon -- Pharyngeal -- Pharyngeal- Pudding Cup -- Pharyngeal -- Pharyngeal- Honey Teaspoon -- Pharyngeal -- Pharyngeal- Honey Cup -- Pharyngeal -- Pharyngeal- Nectar Teaspoon -- Pharyngeal -- Pharyngeal- Nectar Cup -- Pharyngeal -- Pharyngeal- Nectar Straw -- Pharyngeal -- Pharyngeal- Thin Teaspoon -- Pharyngeal -- Pharyngeal- Thin Cup WFL Pharyngeal -- Pharyngeal- Thin Straw Penetration/Aspiration before swallow Pharyngeal Material enters airway, passes BELOW cords without attempt by patient to eject out (silent aspiration) Pharyngeal- Puree WFL Pharyngeal -- Pharyngeal- Mechanical Soft WFL Pharyngeal -- Pharyngeal- Regular -- Pharyngeal -- Pharyngeal- Multi-consistency -- Pharyngeal -- Pharyngeal- Pill WFL Pharyngeal -- Pharyngeal Comment --  CHL IP CERVICAL ESOPHAGEAL PHASE 08/05/2017 Cervical Esophageal Phase Impaired Pudding Teaspoon -- Pudding Cup -- Honey Teaspoon -- Honey Cup -- Nectar Teaspoon -- Nectar Cup -- Nectar Straw -- Thin Teaspoon -- Thin Cup Reduced cricopharyngeal relaxation;Esophageal backflow  into the pharynx Thin Straw Reduced cricopharyngeal relaxation;Esophageal backflow into the pharynx Puree Reduced cricopharyngeal relaxation;Esophageal backflow into the pharynx Mechanical Soft Reduced cricopharyngeal relaxation;Esophageal backflow into the pharynx Regular -- Multi-consistency -- Pill Reduced cricopharyngeal relaxation Cervical Esophageal Comment -- CHL IP GO 03/28/2015 Functional Assessment Tool Used MBSS Functional Limitations Swallowing Swallow Current Status (Z6109) CI Swallow Goal Status (U0454) CI Swallow Discharge Status (U9811) CI Motor Speech Current Status (B1478) (None) Motor Speech Goal Status (G9562) (None) Motor Speech Goal Status (Z3086) (None) Spoken Language Comprehension Current Status (V7846) (None) Spoken Language Comprehension Goal Status (N6295) (None) Spoken Language Comprehension Discharge Status (M8413) (None) Spoken Language Expression Current Status (K4401) (None) Spoken Language Expression Goal Status (U2725) (None) Spoken Language Expression Discharge Status (D6644) (None) Attention Current Status (I3474) (None) Attention Goal Status (Q5956) (None) Attention Discharge Status (L8756) (None) Memory Current Status (E3329) (None) Memory Goal Status (J1884) (None) Memory Discharge Status (Z6606) (None) Voice Current Status (T0160) (None) Voice Goal Status (F0932) (None) Voice Discharge Status (T5573) (None) Other Speech-Language Pathology Functional Limitation Current Status (U2025) (None) Other Speech-Language Pathology Functional Limitation Goal Status (K2706) (None) Other Speech-Language Pathology Functional Limitation Discharge Status [phone]) (None) Germain Osgood 08/05/2017, 3:12 PM  Germain Osgood, M.A. CCC-SLP [phone]             US Abdomen Limited Ruq  Result Date: 08/12/2017 CLINICAL DATA:  Vomiting. EXAM: ULTRASOUND ABDOMEN LIMITED RIGHT UPPER QUADRANT COMPARISON:  CT scan of July 07, 2013. FINDINGS:  Gallbladder: No gallstones or wall thickening  visualized. No sonographic Murphy sign noted by sonographer. Sludge is noted in the gallbladder. Common bile duct: Diameter: 3 mm which is within normal limits. Liver: No focal lesion identified, although left hepatic lobe is not visualized due to overlying bandage. Within normal limits in parenchymal echogenicity. Portal vein is patent on color Doppler imaging with normal direction of blood flow towards the liver. IMPRESSION: Exam is somewhat limited due to overlying bandage. Sludge is noted in gallbladder lumen. No other definite abnormality seen in the right upper quadrant of the abdomen. Electronically Signed   By: Marijo Conception, M.D.   On: 08/12/2017 09:45    Assessment/Plan Cough with Fever  Will Get Chest Xray. Also get repeat labs. Check UA Check Vitals Q4 hours. Continue Zofran for Nausea. And Encourage PO fluids. D/W daughter in the room. She says if he Gets worse she wants him to be transferred to the Hospital.    Family/ staff Communication:   Labs/tests ordered:

## 2017-09-04 ENCOUNTER — Non-Acute Institutional Stay (SKILLED_NURSING_FACILITY): Payer: Medicare Other | Admitting: Internal Medicine

## 2017-09-04 ENCOUNTER — Encounter (HOSPITAL_COMMUNITY)
Admission: RE | Admit: 2017-09-04 | Discharge: 2017-09-04 | Disposition: A | Discharge status: Home / Self Care | Payer: Medicare Other | Source: Skilled Nursing Facility | Attending: Internal Medicine | Admitting: Internal Medicine

## 2017-09-04 ENCOUNTER — Other Ambulatory Visit (HOSPITAL_COMMUNITY): Payer: Self-pay | Admitting: Specialist

## 2017-09-04 ENCOUNTER — Encounter: Payer: Self-pay | Admitting: Internal Medicine

## 2017-09-04 DIAGNOSIS — J189 Pneumonia, unspecified organism: Secondary | ICD-10-CM

## 2017-09-04 DIAGNOSIS — D631 Anemia in chronic kidney disease: Secondary | ICD-10-CM | POA: Insufficient documentation

## 2017-09-04 DIAGNOSIS — N183 Chronic kidney disease, stage 3 unspecified: Secondary | ICD-10-CM

## 2017-09-04 DIAGNOSIS — E785 Hyperlipidemia, unspecified: Secondary | ICD-10-CM | POA: Insufficient documentation

## 2017-09-04 DIAGNOSIS — J181 Lobar pneumonia, unspecified organism: Secondary | ICD-10-CM

## 2017-09-04 DIAGNOSIS — Z48812 Encounter for surgical aftercare following surgery on the circulatory system: Secondary | ICD-10-CM | POA: Insufficient documentation

## 2017-09-04 DIAGNOSIS — I480 Paroxysmal atrial fibrillation: Secondary | ICD-10-CM

## 2017-09-04 DIAGNOSIS — Z951 Presence of aortocoronary bypass graft: Secondary | ICD-10-CM | POA: Diagnosis not present

## 2017-09-04 DIAGNOSIS — E870 Hyperosmolality and hypernatremia: Secondary | ICD-10-CM

## 2017-09-04 DIAGNOSIS — R1319 Other dysphagia: Secondary | ICD-10-CM

## 2017-09-04 DIAGNOSIS — I10 Essential (primary) hypertension: Secondary | ICD-10-CM | POA: Insufficient documentation

## 2017-09-04 DIAGNOSIS — Z7901 Long term (current) use of anticoagulants: Secondary | ICD-10-CM | POA: Insufficient documentation

## 2017-09-04 LAB — BASIC METABOLIC PANEL
ANION GAP: 12 (ref 5–15)
BUN: 61 mg/dL — AB (ref 6–20)
CO2: 26 mmol/L (ref 22–32)
Calcium: 9.9 mg/dL (ref 8.9–10.3)
Chloride: 112 mmol/L — ABNORMAL HIGH (ref 101–111)
Creatinine, Ser: 3.57 mg/dL — ABNORMAL HIGH (ref 0.61–1.24)
GFR calc Af Amer: 17 mL/min — ABNORMAL LOW (ref >60)
GFR calc non Af Amer: 15 mL/min — ABNORMAL LOW (ref >60)
GLUCOSE: 122 mg/dL — AB (ref 65–99)
POTASSIUM: 4.4 mmol/L (ref 3.5–5.1)
Sodium: 150 mmol/L — ABNORMAL HIGH (ref 135–145)

## 2017-09-04 LAB — CBC
HEMATOCRIT: 37.2 % — AB (ref 39.0–52.0)
Hemoglobin: 11.4 g/dL — ABNORMAL LOW (ref 13.0–17.0)
MCH: 34.7 pg — AB (ref 26.0–34.0)
MCHC: 30.6 g/dL (ref 30.0–36.0)
MCV: 113.1 fL — AB (ref 78.0–100.0)
Platelets: 128 10*3/uL — ABNORMAL LOW (ref 150–400)
RBC: 3.29 MIL/uL — AB (ref 4.22–5.81)
RDW: 18.5 % — ABNORMAL HIGH (ref 11.5–15.5)
WBC: 7.3 10*3/uL (ref 4.0–10.5)

## 2017-09-04 LAB — PROTIME-INR
INR: 3.49
Prothrombin Time: 34.8 seconds — ABNORMAL HIGH (ref 11.4–15.2)

## 2017-09-04 NOTE — Progress Notes (Signed)
Location:   [facility] Room Number: 134/P Place of Service:  SNF [phone]) Provider:  Imogene Burn, MD  Patient Care Team: Glenda Chroman, MD as PCP - General (Internal Medicine) Jacolyn Reedy, MD as Consulting Physician (Cardiology)  Extended Emergency Contact Information Primary Emergency Contact: [name], [place] [address] [name] of [place] Phone: [phone] Mobile Phone: [phone] Relation: Daughter Secondary Emergency Contact: [name] Address: [address] [place], [place] [address] [place] Phone: [phone] Relation: Daughter  Code Status:  Full Code Goals of care: Advanced Directive information Advanced Directives 09/04/2017  Does Patient Have a Medical Advance Directive? Yes  Type of Advance Directive (No Data)  Does patient want to make changes to medical advance directive? No - Patient declined  Copy of [place] in Chart? -  Would patient like information on creating a medical advance directive? No - Patient declined  Pre-existing out of facility DNR order (yellow form or pink MOST form) -     Chief Complaint  Patient presents with  . Acute Visit    F/U Pneumonia  As well as INR follow-up with history of atrial fibrillation on chronic Coumadin-also hypernatremia  HPI:  Pt is a 83 y.o. male seen today for an acute visit for follow-up of left base pneumonia-as well as follow-up of INR on chronic Coumadin with history of atrial fibrillation as well as some history of hypernatremia failure to thrive.  Patient has a very complex medical history including CABG 2-as well as atrial fibrillation on chronic L it was-right pontine stroke in 2016 as well as hearing loss dysphagia hypertension BPH and peripheral vascular disease.  Most recently he underwent a CABG 2 on 08/08/2017.  Postop course was located by acute on chronic renal failure that  required hemodialysis.  When patient was more alert he refused dialysis.  He also has a history of aspiration pneumonia that was treated with antibiotics he is on a dysphagia 3 diet.  Was also started on Coumadin for atrial fibrillation rate is controlled on amiodarone as well as diltiazem.  He has had decreased appetite since he's been here complaints of nausea without vomiting he has been started on Zofran.  He did have a recent chest x-ray apparently there was concerns for aspiration re-aspiration-and he did show a left base pneumonia he's been started on Augmentin.  Today he says his breathing is fairly stable compared to yesterday-O2 sats is in the 90s on oxygen.  We did order labs with does show sodium rising slightly up to 150 had been 148 previously-renal function appears to be stable with a creatinine of 3.57 BUN of 61 which is relatively his recent baseline.  INR today is elevated at 3.3 for Coumadin is currently on hold-this will need to be rechecked this weekend.  Eventually his family would like to switch back to   Eliquis which she had been on previously.  Vital signs today are stable.     Past Medical History:  Diagnosis Date  . BPH (benign prostatic hypertrophy)   . 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 Chronic kidney disease stage 3   . Coronary artery disease    DR. TILLEY IS PT'S CARDIOLGIST  . GERD (gastroesophageal reflux disease)   . Gout   . Hyperlipidemia   .  Hypertensive heart disease   . Incontinence of urine   . Memory loss   . Myocardial infarction ([facility]) 1994  . Osteoarthritis   . Paroxysmal atrial fibrillation ([facility])   . Peripheral vascular disease ([facility])   . Personal history of transient ischemic attack (TIA) and cerebral infarction without residual deficit   . Right pontine stroke ([facility]) 02/27/2015  . Tooth disease    Past Surgical History:  Procedure Laterality Date  . CARDIAC CATHETERIZATION  07/21/2005  .  CORONARY ARTERY BYPASS GRAFT N/A 08/07/2017   Procedure: CORONARY ARTERY BYPASS GRAFTING (CABG) x 2 WITH ENDOSCOPIC HARVESTING OF RIGHT SAPHENOUS VEIN.;  Surgeon: Ivin Poot, MD;  Location: [facility];  Service: Open Heart Surgery;  Laterality: N/A;  . HERNIA REPAIR  12/26/2002   BILATERAL INGUINAL HERNIA REPAIR  . LEFT HEART CATH AND CORONARY ANGIOGRAPHY N/A 08/03/2017   Procedure: LEFT HEART CATH AND CORONARY ANGIOGRAPHY;  Surgeon: Sherren Mocha, MD;  Location: [facility];  Service: Cardiovascular;  Laterality: N/A;  . MASTOID SURGERY X 5    . PROSTATE SURGERY    . TEE WITHOUT CARDIOVERSION N/A 08/07/2017   Procedure: TRANSESOPHAGEAL ECHOCARDIOGRAM (TEE);  Surgeon: Prescott Gum, Collier Salina, MD;  Location: [facility];  Service: Open Heart Surgery;  Laterality: N/A;  . TOTAL KNEE ARTHROPLASTY Left 03/28/2013   Procedure: LEFT TOTAL KNEE ARTHROPLASTY;  Surgeon: Gearlean Alf, MD;  Location: [facility];  Service: Orthopedics;  Laterality: Left;    Allergies  Allergen Reactions  . Lipitor [Atorvastatin] Other (See Comments)    Severe constipation    Outpatient Encounter Prescriptions as of 09/04/2017  Medication Sig  . acetaminophen (TYLENOL) 500 MG tablet Take 1,000 mg by mouth every 8 (eight) hours as needed.   Marland Kitchen allopurinol (ZYLOPRIM) 300 MG tablet Take 0.5 tablets (150 mg total) by mouth daily.  Marland Kitchen amiodarone (PACERONE) 200 MG tablet Take 1 tablet (200 mg total) by mouth daily.  Marland Kitchen amoxicillin-clavulanate (AUGMENTIN) 500-125 MG tablet Take 1 tablet by mouth 2 (two) times daily.  Marland Kitchen aspirin EC 81 MG EC tablet Take 1 tablet (81 mg total) by mouth daily.  . Cholecalciferol (VITAMIN D3) 2000 units TABS Take 2,000 Units by mouth daily.  Marland Kitchen docusate sodium (COLACE) 100 MG capsule Take 100 mg by mouth daily.  . feeding supplement, ENSURE ENLIVE, (ENSURE ENLIVE) LIQD Take 237 mLs by mouth 2 (two) times daily between meals.  . finasteride (PROSCAR) 5 MG tablet Take 5 mg by mouth daily.  Marland Kitchen  HYDROcodone-acetaminophen (NORCO/VICODIN) 5-325 MG tablet Take 1 tablet by mouth every 6 (six) hours as needed for moderate pain.  Marland Kitchen omeprazole (PRILOSEC) 20 MG capsule Take 20 mg by mouth daily.  . ondansetron (ZOFRAN) 4 MG tablet Take 4 mg by mouth every 8 (eight) hours as needed for nausea or vomiting.  . rosuvastatin (CRESTOR) 10 MG tablet Take 1 tablet (10 mg total) by mouth daily.  . [DISCONTINUED] warfarin (COUMADIN) 1 MG tablet Take 1 tablet (1 mg total) by mouth daily at 6 PM.   No facility-administered encounter medications on file as of 09/04/2017.     Review of Systems Currently he is not complaining of any fever or chills says his breathing is stable since he does feel weak and fatigued.  Skin does not complain of rashes or itching or diaphoresis.  Head ears eyes nose mouth and throat does not complaining of sore throat or visual changes.  Respiratory-has complained of cough-does not complain of increased shortness of breath from baseline.  Cardiac does not complain of chest pain or palpitations at this time does not appear to have significant lower extremity edema.  GI is not complaining of abdominal discomfort but says he has a poor appetite does complain of nausea without vomiting does not complain of abdominal pain.  GU is not complaining of dysuria.  Musculoskeletal has weakness but does not complain of joint pain currently does have some history of back pain however.  Neurologic does not complain of dizziness headache or numbness.  Psych is not complaining of overt anxiety or depression does complain of being weak and fatigued.    There is no immunization history on file for this patient. Pertinent  Health Maintenance Due  Topic Date Due  . PNA vac Low Risk Adult (1 of 2 - PCV13) 10/20/1999  . INFLUENZA VACCINE  06/24/2017   Fall Risk  10/27/2016 10/30/2015  Falls in the past year? No No   Functional Status Survey:    Vitals:   09/04/17 1337  BP: (!)  126/54  Pulse: (!) 101  Resp: (!) 24  Temp: (!) 97.3 F (36.3 C)  TempSrc: Oral  SpO2: 96%   Repeat heart rate was 89 on auscultation.   Physical Exam  In general this is a frail elderly male in no distress sitting comfortably in his wheelchair.  His skin is warm and dry does have chronic bruising of his upper extremities as well as a midline surgical scar At times  distal aspect of fingers will appear somewhat blue but he does not show signs of general cyanosis    Oropharynx is clear mucous membranes appear fairly moist.  Eyes visual acuity appears grossly intact.  Respiratory has shallow air entry at could not really appreciate overt congestion there is no labored breathing.  Heart is irregular irregular rate and rhythm in the 80s he does not have significant lower extremity edema.  Abdomen is soft nontender with positive bowel sounds.  Muscle skeletal does have general frailty but moves all extremities 4.  Neurologic he is alert I cannot really appreciate lateralizing findings cranial nerves appear grossly intact to speech is clear.  Psych he appears alert and oriented pleasant and appropriate    Labs reviewed:  Updated labs obtained later today show a sodium of 150 potassium of 4.4 BUN is 61 creatinine 3.57.  White count is 7.3 hemoglobin 11.4 platelets of 128  Recent Labs  08/17/17 0501  08/18/17 0355  08/19/17 0332  08/26/17 0504 08/27/17 0452 08/28/17 1620 08/31/17 0630 09/03/17 1020  NA 133*  < > 131*  < > 130*  < > 138 142 146* 151* 148*  K 3.9  < > 3.3*  < > 3.5  < > 3.6 3.7 4.2 4.2 4.6  CL 101  < > 97*  < > 96*  < > 101 106 115* 116* 111  CO2 22  < > 21*  --  20*  < > 22 22 23 25 27   GLUCOSE 126*  < > 117*  < > 103*  < > 107* 103* 113* 109* 117*  BUN 22*  < > 46*  < > 65*  < > 100* 100* 84* 75* 66*  CREATININE 1.42*  < > 2.73*  < > 3.77*  < > 5.17* 4.79* 3.90* 3.56* 3.67*  CALCIUM 7.5*  < > 8.0*  --  8.3*  < > 9.5 9.2 9.2 9.6 9.7  MG 1.8  --  1.9   --  1.9  --   --   --   --   --   --  PHOS 2.0*  < > 5.4*  --  5.6*  < > 5.3* 4.8* 4.1  --   --   < > = values in this interval not displayed.  Recent Labs  08/22/17 0612 08/23/17 0343 08/24/17 0416  08/26/17 0504 08/27/17 0452 08/28/17 1620  AST 24 20 18   --   --   --   --   ALT 31 28 25   --   --   --   --   ALKPHOS 77 72 65  --   --   --   --   BILITOT 0.7 0.8 1.1  --   --   --   --   PROT 5.9* 6.0* 6.2*  --   --   --   --   ALBUMIN 2.4* 2.4* 2.5*  < > 2.4* 2.3* 2.3*  < > = values in this interval not displayed.  Recent Labs  08/17/17 0501  08/26/17 0504 08/31/17 0630 09/03/17 1020  WBC 13.1*  < > 6.5 6.3 11.6*  NEUTROABS 10.0*  --   --  4.8 9.9*  HGB 11.1*  < > 8.9* 11.0* 11.4*  HCT 33.2*  < > 27.0* 35.0* 37.7*  MCV 99.4  < > 101.1* 109.4* 112.9*  PLT 162  < > 160 158 169  < > = values in this interval not displayed. Lab Results  Component Value Date   TSH 3.035 08/04/2017   Lab Results  Component Value Date   HGBA1C 5.3 08/04/2017   Lab Results  Component Value Date   CHOL 162 02/28/2015   HDL 31 (L) 02/28/2015   LDLCALC 102 (H) 02/28/2015   TRIG 104 08/17/2017   CHOLHDL 5.2 02/28/2015    Significant Diagnostic Results in last 30 days:  Dg Chest 2 View  Result Date: 08/26/2017 CLINICAL DATA:  Status post CABG on August 07, 2017 EXAM: CHEST  2 VIEW COMPARISON:  Portable chest x-ray of August 21, 2017. FINDINGS: The lungs are adequately inflated. There is persistent increased density in the retrocardiac region. There is no pneumothorax or significant pleural effusion. The cardiac silhouette is top-normal in size. The pulmonary vascularity is not engorged. The sternal wires are intact. The left PICC line tip projects over the midportion of the SVC. The bony thorax exhibits no acute abnormality. IMPRESSION: Persistent left lower lobe atelectasis or pneumonia. No significant pulmonary edema or pleural effusion. Electronically Signed   By: David  Martinique M.D.    On: 08/26/2017 07:45   US Renal  Result Date: 08/12/2017 CLINICAL DATA:  Acute kidney injury.  Open heart surgery 08/07/2017 EXAM: RENAL / URINARY TRACT ULTRASOUND COMPLETE COMPARISON:  CT abdomen and pelvis 07/07/2013 FINDINGS: Right Kidney: Length: 10.2 cm. Diffuse renal parenchymal thinning with increased parenchymal echotexture consistent with chronic medical renal disease. No solid mass, stone, or hydronephrosis identified. Left Kidney: Length: 8.5 cm. Diffuse renal parenchymal thinning with increased parenchymal echotexture consistent with chronic medical renal disease. Multiple cysts are demonstrated, largest measuring 8.2 cm in maximal diameter. No solid masses seen. No hydronephrosis. Visualization of the left kidney is limited due to poor penetration. Bladder: Bladder is decompressed with a Foley catheter and is not evaluated. IMPRESSION: Bilateral renal parenchymal atrophy and increased parenchymal echotexture consistent with chronic medical renal disease. No hydronephrosis. Benign-appearing left renal cysts. Electronically Signed   By: Lucienne Capers M.D.   On: 08/12/2017 22:35   Dg Chest Port 1 View  Result Date: 08/21/2017 CLINICAL DATA:  CABG. EXAM: PORTABLE CHEST 1 VIEW  COMPARISON:  08/20/2017 . FINDINGS: Dual lumen left subclavian central line in stable position. Prior CABG. Cardiomegaly with normal pulmonary vascularity. Bibasilar atelectasis. Improved bibasilar infiltrates/edema. Tiny bilateral pleural effusions. No pneumothorax. Surgical staples and clips right chest. IMPRESSION: 1. Dual-lumen left subclavian central line stable position. 2. Prior CABG. Cardiomegaly with normal pulmonary vascularity. Improved bibasilar infiltrates/edema. Findings most consistent improving CHF. Tiny bilateral pleural effusions. 3.  Bibasilar atelectasis . Electronically Signed   By: Marcello Moores  Register   On: 08/21/2017 07:44   Dg Chest Port 1 View  Result Date: 08/20/2017 CLINICAL DATA:  Shortness of  breath.  Status post CABG 13 days ago. EXAM: PORTABLE CHEST 1 VIEW COMPARISON:  Portable chest x-ray of August 18, 2017 FINDINGS: The lungs are adequately inflated. There remain increased densities at the right lung base and in the left mid and lower lung. There is small left pleural effusion. The cardiac silhouette is enlarged. The central pulmonary vascularity is mildly prominent but stable. The dual-lumen dialysis catheter is in stable position. The PICC line tip lies in the distal portion of the SVC. There are calcifications in the wall of the thoracic aorta. The visualized sternal wires are intact. IMPRESSION: Persistent left lower lobe and right lower lobe atelectasis or pneumonia. Small left pleural effusion. Mild central pulmonary vascular congestion. Electronically Signed   By: David  Martinique M.D.   On: 08/20/2017 07:55   Dg Chest Port 1 View  Result Date: 08/18/2017 CLINICAL DATA:  Shortness of breath.  CABG. EXAM: PORTABLE CHEST 1 VIEW COMPARISON:  08/16/2017. FINDINGS: Left subclavian line left PICC line in stable position. Prior CABG. Stable cardiomegaly. Improvement of basilar interstitial prominence consistent improving CHF. Mild bibasilar atelectasis . No pleural effusion or pneumothorax. Surgical staples and clips right chest. IMPRESSION: 1.  Left subclavian line and left PICC line in stable position. 2. Prior CABG. Stable cardiomegaly. Improving bilateral interstitial prominence consistent improving CHF. 3. Mild basilar atelectasis. Electronically Signed   By: Marcello Moores  Register   On: 08/18/2017 07:15   Dg Chest Port 1 View  Result Date: 08/16/2017 CLINICAL DATA:  Congestive heart failure EXAM: PORTABLE CHEST 1 VIEW COMPARISON:  08/15/2017 FINDINGS: Mild improvement in congestive heart failure with edema. Bilateral effusions left greater than right unchanged. Bibasilar atelectasis left greater than right unchanged. Left arm PICC tip in the lower SVC unchanged. Left subclavian dual lumen  central venous catheter tip in the left innominate vein unchanged. IMPRESSION: Mild improvement in congestive heart failure and edema. Bibasilar atelectasis and effusion left greater than right Electronically Signed   By: Franchot Gallo M.D.   On: 08/16/2017 14:48   Dg Chest Port 1 View  Result Date: 08/15/2017 CLINICAL DATA:  Status post coronary bypass grafting EXAM: PORTABLE CHEST 1 VIEW COMPARISON:  08/14/2017 FINDINGS: Cardiac shadow remains enlarged. Postsurgical changes are again seen. Aortic calcifications are noted. PICC line is noted in the superior aspect of the right atrium stable from the prior exam. The right lung is clear improved from the prior exam. Stable left retrocardiac atelectasis is noted. No pneumothorax is seen. Left subclavian central line is noted as well. IMPRESSION: Stable left retrocardiac atelectasis. Electronically Signed   By: Inez Catalina M.D.   On: 08/15/2017 08:36   Dg Chest Port 1 View  Result Date: 08/14/2017 CLINICAL DATA:  Sore chest after cardiac surgery EXAM: PORTABLE CHEST 1 VIEW COMPARISON:  Yesterday FINDINGS: Low volume chest with hazy opacity from layering effusions. There is vascular congestion and basal atelectasis. No pneumothorax visible. Stable  cardiomegaly. Left upper extremity PICC with tip at the distal SVC. IMPRESSION: 1. Stable from yesterday. 2. Low volume chest with atelectasis, effusions, and vascular congestion. Electronically Signed   By: Monte Fantasia M.D.   On: 08/14/2017 08:05   Dg Chest Port 1 View  Result Date: 08/13/2017 CLINICAL DATA:  Ileus, CABG EXAM: PORTABLE CHEST 1 VIEW COMPARISON:  08/12/2017 FINDINGS: Changes of CABG. Left dialysis catheter remains in place, unchanged. Cardiomegaly with vascular congestion and mild pulmonary edema. Small layering effusions. IMPRESSION: Mild CHF with small layering effusions. Findings similar to prior study. Electronically Signed   By: Rolm Baptise M.D.   On: 08/13/2017 09:15   Dg Chest Port 1  View  Result Date: 08/12/2017 CLINICAL DATA:  Central line placement EXAM: PORTABLE CHEST 1 VIEW COMPARISON:  08/12/2017 FINDINGS: Left upper extremity catheter tip overlies the SVC. Larger poor left central venous catheter tip poorly visible, appears to overlie the left brachiocephalic region. No pneumothorax is seen. Post sternotomy changes and cutaneous staples over the right chest. Surgical clips in the right axilla. Worsened bibasilar opacification. Cardiomegaly with aortic atherosclerosis. IMPRESSION: 1. Left-sided central venous catheter tip poorly visible, appears to project over the left brachiocephalic region. No pneumothorax. 2. Cardiomegaly with increased vascular congestion and background edema 3. Worsening airspace disease within the bilateral lung bases. Electronically Signed   By: Donavan Foil M.D.   On: 08/12/2017 19:07   Dg Chest Port 1 View  Result Date: 08/12/2017 CLINICAL DATA:  CABG, chest soreness EXAM: PORTABLE CHEST 1 VIEW COMPARISON:  08/11/2017 FINDINGS: Prior CABG. Left PICC line is unchanged. Cardiomegaly with vascular congestion and mild pulmonary edema. Small bilateral effusions suspected. No pneumothorax. IMPRESSION: Continued CHF pattern.  Small bilateral effusions. Electronically Signed   By: Rolm Baptise M.D.   On: 08/12/2017 08:45   Dg Chest Port 1 View  Result Date: 08/11/2017 CLINICAL DATA:  Chest pain following coronary bypass grafting EXAM: PORTABLE CHEST 1 VIEW COMPARISON:  08/10/2017 FINDINGS: Cardiac shadow remains enlarged. Left-sided PICC line is again seen. Right jugular sheath has been removed. Postoperative changes are again noted. Mild vascular congestion and interstitial edema is seen stable from the prior exam. Likely small pleural effusions remain. No new focal infiltrate is seen. IMPRESSION: Changes of mild CHF. Electronically Signed   By: Inez Catalina M.D.   On: 08/11/2017 07:48   Dg Chest Port 1 View  Result Date: 08/10/2017 CLINICAL DATA:  Left  PICC catheter placement. EXAM: PORTABLE CHEST 1 VIEW COMPARISON:  Chest x-ray from same date. FINDINGS: Normal insertion of a left-sided PICC line with the tip projecting near the cavoatrial junction. Unchanged right internal jugular sheath. Stable cardiomegaly with pulmonary vascular congestion and pulmonary edema. Small bilateral pleural effusions are unchanged. No pneumothorax. No acute osseous abnormality. IMPRESSION: 1. Interval placement of a left-sided PICC line with the tip projecting over the cavoatrial junction. 2. Unchanged cardiomegaly with pulmonary edema and small bilateral pleural effusions. Electronically Signed   By: Titus Dubin M.D.   On: 08/10/2017 13:34   Dg Chest Port 1 View  Result Date: 08/10/2017 CLINICAL DATA:  Status post coronary artery bypass graft. EXAM: PORTABLE CHEST 1 VIEW COMPARISON:  Radiograph of August 09, 2017. FINDINGS: Stable cardiomegaly. Sternotomy wires are noted. Atherosclerosis of thoracic aorta is noted. No pneumothorax is noted. Left-sided chest tube has been removed. Bilateral perihilar and basilar opacities are noted concerning for pulmonary edema with mild associated pleural effusions. Bony thorax is unremarkable. IMPRESSION: Left-sided chest tube has been removed  without evidence of pneumothorax. Aortic atherosclerosis. Bilateral pulmonary edema is noted with mild bilateral pleural effusions. Electronically Signed   By: Marijo Conception, M.D.   On: 08/10/2017 07:37   Dg Chest Port 1 View  Result Date: 08/09/2017 CLINICAL DATA:  Follow-up CABG EXAM: PORTABLE CHEST 1 VIEW COMPARISON:  08/08/2017 FINDINGS: Swan-Ganz catheter is been removed. Venous access sheath remains in place. Left chest tube remains in place. No pneumothorax. Slight worsening of bilateral effusions and lower lobe atelectatic changes. IMPRESSION: Slight worsening of bilateral effusions and lower lobe atelectasis. Electronically Signed   By: Nelson Chimes M.D.   On: 08/09/2017 08:57    Dg Chest Port 1 View  Result Date: 08/08/2017 CLINICAL DATA:  Followup CABG EXAM: PORTABLE CHEST 1 VIEW COMPARISON:  08/07/2017 FINDINGS: Endotracheal tube is been removed. Left chest tube remains in place. Swan-Ganz catheter remains in place. No pneumothorax. Persistent volume loss in both lower lobes left worse than right. Upper lungs are clear. IMPRESSION: No pneumothorax. Persistent lower lobe volume loss left worse than right. Extubated. Electronically Signed   By: Nelson Chimes M.D.   On: 08/08/2017 07:54   Dg Chest Port 1 View  Result Date: 08/07/2017 CLINICAL DATA:  Status post cardiac surgery. EXAM: PORTABLE CHEST 1 VIEW COMPARISON:  Radiographs of July 31, 2017. FINDINGS: Stable cardiomegaly with mild central pulmonary vascular congestion. Atherosclerosis of thoracic aorta is noted. Endotracheal tube is seen projected over tracheal air shadow with distal tip 5 cm above the carina. Right internal jugular Swan-Ganz catheter is noted with distal tip in expected position of main pulmonary artery. Left-sided chest tube is noted without pneumothorax. Mild bibasilar subsegmental atelectasis is noted. Right axillae surgical clips and staples are noted. IMPRESSION: Endotracheal tube in grossly good position. Left-sided chest tube is noted without pneumothorax. Mild central pulmonary vascular congestion. Mild bibasilar subsegmental atelectasis. Electronically Signed   By: Marijo Conception, M.D.   On: 08/07/2017 13:52   Dg Abd Portable 1v  Result Date: 08/15/2017 CLINICAL DATA:  Follow-up ileus EXAM: PORTABLE ABDOMEN - 1 VIEW COMPARISON:  08/14/2017 FINDINGS: Scattered large and small bowel gas is noted. No obstructive changes are seen. Contrast material is noted throughout the colon stable from the prior exam. Diverticulosis without findings of diverticulitis is noted. No free air is seen. No obstructive changes are noted. IMPRESSION: No acute abnormality noted. No findings to suggest obstructive  change are seen. Electronically Signed   By: Inez Catalina M.D.   On: 08/15/2017 08:35   Dg Abd Portable 1v  Result Date: 08/14/2017 CLINICAL DATA:  Ileus EXAM: PORTABLE ABDOMEN - 1 VIEW COMPARISON:  August 13, 2017 FINDINGS: There is contrast in the colon. There are multiple contrast filled colonic diverticulum. There is no bowel dilatation or air-fluid level to suggest bowel obstruction. No free air. Consolidation noted in left lung base. Ovary pacemaker wires are attached to the right heart. IMPRESSION: No bowel dilatation or air-fluid level to suggest ileus or bowel obstruction. No free air. Multiple colonic filled diverticulum. Persistent left lower lobe consolidation. Right lung base appears clear. Pacemaker wires attached to right heart. Electronically Signed   By: Lowella Grip III M.D.   On: 08/14/2017 08:02   Dg Abd Portable 1v  Result Date: 08/13/2017 CLINICAL DATA:  Ileus EXAM: PORTABLE ABDOMEN - 1 VIEW COMPARISON:  08/11/2017 FINDINGS: Contrast noted throughout nondistended transverse colon and left colon. Continued gaseous distention of the cecum and ascending colon, stable. Contrast noted within sigmoid diverticula. No small bowel distention.  No free air. IMPRESSION: Continued moderate gaseous distention of the right colon. Remainder the colon is decompressed with contrast throughout. Filling of sigmoid diverticula. Electronically Signed   By: Rolm Baptise M.D.   On: 08/13/2017 09:14   Dg Abd Portable 1v  Result Date: 08/11/2017 CLINICAL DATA:  Evaluate for ileus EXAM: PORTABLE ABDOMEN - 1 VIEW COMPARISON:  None. FINDINGS: Contrast is seen in the colon. The transverse and descending colon are decompressed. The ascending colon is mildly prominent. There is a paucity of small bowel gas but no definitive dilatation. Linear densities projected over the right side of the abdomen may be on or in patient. Recommend clinical correlation. No other acute abnormalities. IMPRESSION: 1. The  ascending colon is mildly prominent with decompression of the transverse and descending colon. This is a nonspecific bowel gas pattern. 2. Linear densities projected over the right side of the abdomen may be on or in the patient. Recommend clinical correlation. Electronically Signed   By: Dorise Bullion III M.D   On: 08/11/2017 18:12   Dg Swallowing Func-speech Pathology  Result Date: 08/27/2017 Objective Swallowing Evaluation: Type of Study: MBS-Modified Barium Swallow Study Patient Details Name: Joel Cowin MRN: [id number] Date of Birth: May 13, 1932 Today's Date: 08/27/2017 Time: SLP Start Time (ACUTE ONLY): 1120-SLP Stop Time (ACUTE ONLY): 1140 SLP Time Calculation (min) (ACUTE ONLY): 20 min Past Medical History: Past Medical History: Diagnosis Date . BPH (benign prostatic hypertrophy)  . 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 Chronic kidney disease stage 3  . 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 GERD (gastroesophageal reflux disease)  . Gout  . Hyperlipidemia  . Hypertensive heart disease  . Incontinence of urine  . Memory loss  . Myocardial infarction ([place]) 1994 . Osteoarthritis  . Paroxysmal atrial fibrillation (HCC)  . Peripheral vascular disease ([place])  . Personal history of transient ischemic attack (TIA) and cerebral infarction without residual deficit  . Right pontine stroke ([place]) 02/27/2015 . Tooth disease  Past Surgical History: Past Surgical History: Procedure Laterality Date . CARDIAC CATHETERIZATION  07/21/2005 . CORONARY ARTERY BYPASS GRAFT N/A 08/07/2017  Procedure: CORONARY ARTERY BYPASS GRAFTING (CABG) x 2 WITH ENDOSCOPIC HARVESTING OF RIGHT SAPHENOUS VEIN.;  Surgeon: Ivin Poot, MD;  Location: [facility];  Service: Open Heart Surgery;  Laterality: N/A; . HERNIA REPAIR  12/26/2002  BILATERAL INGUINAL HERNIA REPAIR . LEFT HEART CATH AND CORONARY ANGIOGRAPHY N/A 08/03/2017  Procedure: LEFT HEART CATH AND CORONARY ANGIOGRAPHY;  Surgeon: Sherren Mocha,  MD;  Location: [facility];  Service: Cardiovascular;  Laterality: N/A; . MASTOID SURGERY X 5   . PROSTATE SURGERY   . TEE WITHOUT CARDIOVERSION N/A 08/07/2017  Procedure: TRANSESOPHAGEAL ECHOCARDIOGRAM (TEE);  Surgeon: Prescott Gum, Collier Salina, MD;  Location: [facility];  Service: Open Heart Surgery;  Laterality: N/A; . TOTAL KNEE ARTHROPLASTY Left 03/28/2013  Procedure: LEFT TOTAL KNEE ARTHROPLASTY;  Surgeon: Gearlean Alf, MD;  Location: [facility];  Service: Orthopedics;  Laterality: Left; HPI: Pt is an 83 yo male admitted with NSTEMI s/p cath which showed severe left main disease and total right coronary artery stenosis, potential CABG planned for 9/14. 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 Was evaluated by ST this admission prior to CABG; MBS 08/05/17 revealed mild oropharyngeal and cervical esophageal dysphagia, suspected to be more chronic in nature given his h/o dysphagia from prior  CVA and h/o GERD. Dys 3, thin liquids, no straws recommended with ST to follow briefly through surgery given his history. Pt was intubated for CABG on 9/14, transported back to floor on vent and subsequent extubated at 10pm same day. Pt was consuming Dys 2, nectar > developed ileus > NPO and diagnostic reassessment this morning.   Subjective: RN reports pt coughing with PO s/p CABG Assessment / Plan / Recommendation CHL IP CLINICAL IMPRESSIONS 08/27/2017 Clinical Impression Patient with a mild motor based oropharyngeal dysphagia. Patient with oral holding of bolus at the base of tongue in preparation for swallow, at times in excess of 5 seconds, however swallow initiated either at or above the level of the vallecula with all solid and nectar thick liquid consistencies. With thin liquids however, swallow is delayed to the level of the pyriform sinuses which when combined with decreased laryngeal closure, results in deep silent  penetration to the vocal cords. although occurring in only 25-30% of trials, cued cough weak and unsuccessful to clear hte airway resulting in a significant risk of post swallow aspiration. Given decreased functional reserve with acute illness, recommend dysphagia 3 solids with nectar thick liquids. Prognosis for ability to advance diet good with improved medical status and conditioning. Will f/u.  SLP Visit Diagnosis Dysphagia, unspecified (R13.10) Attention and concentration deficit following -- Frontal lobe and executive function deficit following -- Impact on safety and function Moderate aspiration risk   CHL IP TREATMENT RECOMMENDATION 08/27/2017 Treatment Recommendations Therapy as outlined in treatment plan below   Prognosis 08/27/2017 Prognosis for Safe Diet Advancement Good Barriers to Reach Goals -- Barriers/Prognosis Comment -- CHL IP DIET RECOMMENDATION 08/27/2017 SLP Diet Recommendations Dysphagia 3 (Mech soft) solids;Nectar thick liquid Liquid Administration via Cup;No straw Medication Administration Whole meds with puree Compensations Slow rate;Small sips/bites Postural Changes Seated upright at 90 degrees   CHL IP OTHER RECOMMENDATIONS 08/27/2017 Recommended Consults -- Oral Care Recommendations Oral care BID Other Recommendations Order thickener from pharmacy;Prohibited food (jello, ice cream, thin soups);Remove water pitcher   CHL IP FOLLOW UP RECOMMENDATIONS 08/27/2017 Follow up Recommendations Skilled Nursing facility   [facility] IP FREQUENCY AND DURATION 08/27/2017 Speech Therapy Frequency (ACUTE ONLY) min 2x/week Treatment Duration 2 weeks      CHL IP ORAL PHASE 08/27/2017 Oral Phase Impaired Oral - Pudding Teaspoon -- Oral - Pudding Cup -- Oral - Honey Teaspoon -- Oral - Honey Cup -- Oral - Nectar Teaspoon Delayed oral transit;Lingual pumping;Holding of bolus Oral - Nectar Cup Delayed oral transit;Lingual pumping;Holding of bolus Oral - Nectar Straw -- Oral - Thin Teaspoon -- Oral - Thin Cup Delayed oral  transit;Lingual pumping;Holding of bolus Oral - Thin Straw Delayed oral transit;Lingual pumping;Holding of bolus Oral - Puree Delayed oral transit;Lingual pumping;Holding of bolus Oral - Mech Soft Delayed oral transit;Lingual pumping;Holding of bolus Oral - Regular -- Oral - Multi-Consistency -- Oral - Pill NT Oral Phase - Comment --  CHL IP PHARYNGEAL PHASE 08/27/2017 Pharyngeal Phase Impaired Pharyngeal- Pudding Teaspoon -- Pharyngeal -- Pharyngeal- Pudding Cup -- Pharyngeal -- Pharyngeal- Honey Teaspoon -- Pharyngeal -- Pharyngeal- Honey Cup -- Pharyngeal -- Pharyngeal- Nectar Teaspoon Delayed swallow initiation-vallecula;Reduced tongue base retraction;Pharyngeal residue - valleculae Pharyngeal -- Pharyngeal- Nectar Cup Delayed swallow initiation-vallecula;Reduced tongue base retraction;Pharyngeal residue - valleculae Pharyngeal -- Pharyngeal- Nectar Straw -- Pharyngeal -- Pharyngeal- Thin Teaspoon Delayed swallow initiation-pyriform sinuses;Reduced tongue base retraction;Pharyngeal residue - valleculae;Reduced anterior laryngeal mobility Pharyngeal -- Pharyngeal- Thin Cup Delayed swallow initiation-pyriform sinuses;Penetration/Aspiration during swallow;Reduced tongue base retraction;Pharyngeal residue - valleculae;Reduced anterior  laryngeal mobility Pharyngeal Material enters airway, CONTACTS cords and not ejected out Pharyngeal- Thin Straw Delayed swallow initiation-pyriform sinuses;Reduced tongue base retraction;Reduced anterior laryngeal mobility;Penetration/Aspiration during swallow;Pharyngeal residue - valleculae Pharyngeal Material enters airway, CONTACTS cords and not ejected out Pharyngeal- Puree Delayed swallow initiation-vallecula;Reduced tongue base retraction;Pharyngeal residue - valleculae Pharyngeal -- Pharyngeal- Mechanical Soft Delayed swallow initiation-vallecula;Reduced tongue base retraction;Pharyngeal residue - valleculae Pharyngeal -- Pharyngeal- Regular -- Pharyngeal -- Pharyngeal-  Multi-consistency -- Pharyngeal -- Pharyngeal- Pill NT Pharyngeal -- Pharyngeal Comment --  CHL IP CERVICAL ESOPHAGEAL PHASE 08/27/2017 Cervical Esophageal Phase WFL Pudding Teaspoon -- Pudding Cup -- Honey Teaspoon -- Honey Cup -- Nectar Teaspoon -- Nectar Cup -- Nectar Straw -- Thin Teaspoon -- Thin Cup -- Thin Straw -- Puree -- Mechanical Soft -- Regular -- Multi-consistency -- Pill -- Cervical Esophageal Comment -- CHL IP GO 03/28/2015 Functional Assessment Tool Used MBSS Functional Limitations Swallowing Swallow Current Status (S2831) CI Swallow Goal Status (D1761) CI Swallow Discharge Status (Y0737) CI Motor Speech Current Status (T0626) (None) Motor Speech Goal Status (R4854) (None) Motor Speech Goal Status (O2703) (None) Spoken Language Comprehension Current Status (J0093) (None) Spoken Language Comprehension Goal Status (G1829) (None) Spoken Language Comprehension Discharge Status (H3716) (None) Spoken Language Expression Current Status (R6789) (None) Spoken Language Expression Goal Status (F8101) (None) Spoken Language Expression Discharge Status (B5102) (None) Attention Current Status (H8527) (None) Attention Goal Status (P8242) (None) Attention Discharge Status (P5361) (None) Memory Current Status (W4315) (None) Memory Goal Status (Q0086) (None) Memory Discharge Status (P6195) (None) Voice Current Status (K9326) (None) Voice Goal Status (Z1245) (None) Voice Discharge Status (Y0998) (None) Other Speech-Language Pathology Functional Limitation Current Status (P3825) (None) Other Speech-Language Pathology Functional Limitation Goal Status (K5397) (None) Other Speech-Language Pathology Functional Limitation Discharge Status [phone]) (None) Gabriel Rainwater MA, CCC-SLP [phone] McCoy Leah Meryl 08/27/2017, 1:55 PM              Dg Swallowing Func-speech Pathology  Result Date: 08/05/2017 Objective Swallowing Evaluation: Type of Study: MBS-Modified Barium Swallow Study Patient Details Name: Kian Ottaviano MRN:  [id number] Date of Birth: 1932-07-05 Today's Date: 08/05/2017 Time: SLP Start Time (ACUTE ONLY): 1411-SLP Stop Time (ACUTE ONLY): 1426 SLP Time Calculation (min) (ACUTE ONLY): 15 min Past Medical History: Past Medical History: Diagnosis Date . BPH (benign prostatic hypertrophy)  . 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 Chronic kidney disease stage 3  . 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 GERD (gastroesophageal reflux disease)  . Gout  . Hyperlipidemia  . Hypertensive heart disease  . Incontinence of urine  . Memory loss  . Myocardial infarction ([place]) 1994 . Osteoarthritis  . Paroxysmal atrial fibrillation (HCC)  . Peripheral vascular disease ([place])  . Personal history of transient ischemic attack (TIA) and cerebral infarction without residual deficit  . Right pontine stroke ([place]) 02/27/2015 . Tooth disease  Past Surgical History: Past Surgical History: Procedure Laterality Date . CARDIAC CATHETERIZATION  07/21/2005 . HERNIA REPAIR  12/26/2002  BILATERAL INGUINAL HERNIA REPAIR . LEFT HEART CATH AND CORONARY ANGIOGRAPHY N/A 08/03/2017  Procedure: LEFT HEART CATH AND CORONARY ANGIOGRAPHY;  Surgeon: Sherren Mocha, MD;  Location: [facility];  Service: Cardiovascular;  Laterality: N/A; . MASTOID SURGERY X 5   . PROSTATE SURGERY   . TOTAL KNEE ARTHROPLASTY Left 03/28/2013  Procedure: LEFT TOTAL KNEE ARTHROPLASTY;  Surgeon: Gearlean Alf, MD;  Location: [facility];  Service: Orthopedics;  Laterality: Left; HPI: Pt is an 83 yo male  admitted with NSTEMI s/p cath which showed severe left main disease and total right coronary artery stenosis, potential CABG planned for 9/14. 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 Most recent MBS in 2016 recommended regular diet and thin liquids with chin tuck and small sips to reduce episodes of aspiration. Pt and his dtr say that he has not  been using a chin tuck at home anymore but that he also has not had PNA. Subjective: pt says he coughs a lot during PO intake but cannot determine why Assessment / Plan / Recommendation CHL IP CLINICAL IMPRESSIONS 08/05/2017 Clinical Impression Pt has a mild oropharyngeal and cervical esophageal dysphagia, suspected to be more chronic in nature given his h/o dysphagia from prior CVA and h/o GERD. He has intermittent premature spillage of thin liquids that is mostly seen when he takes larger, consecutive boluses. There was one instance in which a small amount of thin liquid was silently spilled directly into his airway before the swallow when taking sequential straw sips. No other aspiration was observed, but would suspect that coughing episodes observed clinically may be related to larger volumes of aspiration. Pt also had reduced relaxation of the UES that allowed for small amounts of backflow into the pharynx, but nothing that compromised the airway. Recommend to continue current Dys 3 diet and thin liquids with emphasis no single cup sips and avoiding mixed consistencies. Will continue to follow pt through his surgery.  SLP Visit Diagnosis Dysphagia, oropharyngeal phase (R13.12);Dysphagia, pharyngoesophageal phase (R13.14) Attention and concentration deficit following -- Frontal lobe and executive function deficit following -- Impact on safety and function Mild aspiration risk;Moderate aspiration risk   CHL IP TREATMENT RECOMMENDATION 08/05/2017 Treatment Recommendations Therapy as outlined in treatment plan below   Prognosis 08/05/2017 Prognosis for Safe Diet Advancement Good Barriers to Reach Goals -- Barriers/Prognosis Comment -- CHL IP DIET RECOMMENDATION 08/05/2017 SLP Diet Recommendations Dysphagia 3 (Mech soft) solids;Thin liquid Liquid Administration via Cup;No straw Medication Administration Whole meds with puree Compensations Slow rate;Small sips/bites;Follow solids with liquid Postural Changes Remain  semi-upright after after feeds/meals (Comment);Seated upright at 90 degrees   CHL IP OTHER RECOMMENDATIONS 08/05/2017 Recommended Consults -- Oral Care Recommendations Oral care BID Other Recommendations --   CHL IP FOLLOW UP RECOMMENDATIONS 08/05/2017 Follow up Recommendations (No Data)   CHL IP FREQUENCY AND DURATION 08/05/2017 Speech Therapy Frequency (ACUTE ONLY) min 2x/week Treatment Duration 2 weeks      CHL IP ORAL PHASE 08/05/2017 Oral Phase Impaired Oral - Pudding Teaspoon -- Oral - Pudding Cup -- Oral - Honey Teaspoon -- Oral - Honey Cup -- Oral - Nectar Teaspoon -- Oral - Nectar Cup -- Oral - Nectar Straw -- Oral - Thin Teaspoon -- Oral - Thin Cup Premature spillage Oral - Thin Straw Premature spillage Oral - Puree WFL Oral - Mech Soft Impaired mastication Oral - Regular -- Oral - Multi-Consistency -- Oral - Pill WFL Oral Phase - Comment --  CHL IP PHARYNGEAL PHASE 08/05/2017 Pharyngeal Phase Impaired Pharyngeal- Pudding Teaspoon -- Pharyngeal -- Pharyngeal- Pudding Cup -- Pharyngeal -- Pharyngeal- Honey Teaspoon -- Pharyngeal -- Pharyngeal- Honey Cup -- Pharyngeal -- Pharyngeal- Nectar Teaspoon -- Pharyngeal -- Pharyngeal- Nectar Cup -- Pharyngeal -- Pharyngeal- Nectar Straw -- Pharyngeal -- Pharyngeal- Thin Teaspoon -- Pharyngeal -- Pharyngeal- Thin Cup WFL Pharyngeal -- Pharyngeal- Thin Straw Penetration/Aspiration before swallow Pharyngeal Material enters airway, passes BELOW cords without attempt by patient to  eject out (silent aspiration) Pharyngeal- Puree WFL Pharyngeal -- Pharyngeal- Mechanical Soft WFL Pharyngeal -- Pharyngeal- Regular -- Pharyngeal -- Pharyngeal- Multi-consistency -- Pharyngeal -- Pharyngeal- Pill WFL Pharyngeal -- Pharyngeal Comment --  CHL IP CERVICAL ESOPHAGEAL PHASE 08/05/2017 Cervical Esophageal Phase Impaired Pudding Teaspoon -- Pudding Cup -- Honey Teaspoon -- Honey Cup -- Nectar Teaspoon -- Nectar Cup -- Nectar Straw -- Thin Teaspoon -- Thin Cup Reduced cricopharyngeal  relaxation;Esophageal backflow into the pharynx Thin Straw Reduced cricopharyngeal relaxation;Esophageal backflow into the pharynx Puree Reduced cricopharyngeal relaxation;Esophageal backflow into the pharynx Mechanical Soft Reduced cricopharyngeal relaxation;Esophageal backflow into the pharynx Regular -- Multi-consistency -- Pill Reduced cricopharyngeal relaxation Cervical Esophageal Comment -- CHL IP GO 03/28/2015 Functional Assessment Tool Used MBSS Functional Limitations Swallowing Swallow Current Status (A5409) CI Swallow Goal Status (W1191) CI Swallow Discharge Status (Y7829) CI Motor Speech Current Status (F6213) (None) Motor Speech Goal Status (Y8657) (None) Motor Speech Goal Status (Q4696) (None) Spoken Language Comprehension Current Status (E9528) (None) Spoken Language Comprehension Goal Status (U1324) (None) Spoken Language Comprehension Discharge Status (M0102) (None) Spoken Language Expression Current Status (V2536) (None) Spoken Language Expression Goal Status (U4403) (None) Spoken Language Expression Discharge Status (K7425) (None) Attention Current Status (Z5638) (None) Attention Goal Status (V5643) (None) Attention Discharge Status (P2951) (None) Memory Current Status (O8416) (None) Memory Goal Status (S0630) (None) Memory Discharge Status (Z6010) (None) Voice Current Status (X3235) (None) Voice Goal Status (T7322) (None) Voice Discharge Status (G2542) (None) Other Speech-Language Pathology Functional Limitation Current Status (H0623) (None) Other Speech-Language Pathology Functional Limitation Goal Status (J6283) (None) Other Speech-Language Pathology Functional Limitation Discharge Status [phone]) (None) Germain Osgood 08/05/2017, 3:12 PM  Germain Osgood, M.A. CCC-SLP [phone]             US Abdomen Limited Ruq  Result Date: 08/12/2017 CLINICAL DATA:  Vomiting. EXAM: ULTRASOUND ABDOMEN LIMITED RIGHT UPPER QUADRANT COMPARISON:  CT scan of July 07, 2013. FINDINGS: Gallbladder: No  gallstones or wall thickening visualized. No sonographic Murphy sign noted by sonographer. Sludge is noted in the gallbladder. Common bile duct: Diameter: 3 mm which is within normal limits. Liver: No focal lesion identified, although left hepatic lobe is not visualized due to overlying bandage. Within normal limits in parenchymal echogenicity. Portal vein is patent on color Doppler imaging with normal direction of blood flow towards the liver. IMPRESSION: Exam is somewhat limited due to overlying bandage. Sludge is noted in gallbladder lumen. No other definite abnormality seen in the right upper quadrant of the abdomen. Electronically Signed   By: Marijo Conception, M.D.   On: 08/12/2017 09:45    Assessment/Plan  #1-history of pneumonia-left base-she has been started on Augmentin this appears to be stable O2 saturations are satisfactory says his breathing is stable but this will have to be watched closely continue signs pulse ox every shift.  #2 history of hypernatremia-sodium has risen slightly up to 150-patient apparently is very difficult to start an IV--preference is to try to increase oral intake is much as possible again fluids wILL continued to be encouraged and we will update this in 2 days--.  #3-history of elevated INR he does not show signs of increased bruising or bleeding from baseline Will update this in 2 days as well INR continues to be slightly supratherapeutic.  #4 history of-aFIB--appears rate controlled beta blocker was discontinued secondary to bradycardia at this point this will have to be watched pulses in the 80s today.--He is on amiodarone  #5 history of CABG-this is followed by cardiology he is on  a statin and aspirin this appears stable he is not complaining of chest pain.  #6 history of anemia this appears stable with a hemoglobin of 11.4 on lab today per chart review this appears to have improved slightly.  At this point will continue to monitor  #7 history of chronic  kidney disease this appears to be at baseline with a creatinine of 3.57 BUN of 61 again we will update this in a couple days as well he is followed by nephrology.  #8 history BPH continues on Proscar apparently this has not been an issue during his stay here.   #9 history of nausea he has when necessary Zofran-also had is on a proton pump inhibitor this will have to be monitored as well as his sodium again fluids will have to be encouraged-.  He continues to deny any abdominal pain or vomiting.  [id number] note greater than 35 minutes spent assessing patient-reviewing his chart-reviewing his labs discussing his status with nursing staff-AND coordinating and formulating a plan of care for numerous diagnoses-of note greater than 50% of time spent coordinating plan of care

## 2017-09-05 LAB — URINE CULTURE

## 2017-09-06 ENCOUNTER — Encounter (HOSPITAL_COMMUNITY)
Admission: RE | Admit: 2017-09-06 | Discharge: 2017-09-06 | Disposition: A | Discharge status: Home / Self Care | Payer: Medicare Other | Source: Skilled Nursing Facility | Attending: *Deleted | Admitting: *Deleted

## 2017-09-06 LAB — CBC WITH DIFFERENTIAL/PLATELET
BASOS ABS: 0 10*3/uL (ref 0.0–0.1)
BASOS PCT: 0 %
Eosinophils Absolute: 0.2 10*3/uL (ref 0.0–0.7)
Eosinophils Relative: 3 %
HEMATOCRIT: 33.4 % — AB (ref 39.0–52.0)
HEMOGLOBIN: 10.3 g/dL — AB (ref 13.0–17.0)
Lymphocytes Relative: 14 %
Lymphs Abs: 0.8 10*3/uL (ref 0.7–4.0)
MCH: 34.7 pg — ABNORMAL HIGH (ref 26.0–34.0)
MCHC: 30.8 g/dL (ref 30.0–36.0)
MCV: 112.5 fL — ABNORMAL HIGH (ref 78.0–100.0)
MONOS PCT: 6 %
Monocytes Absolute: 0.3 10*3/uL (ref 0.1–1.0)
NEUTROS ABS: 4.5 10*3/uL (ref 1.7–7.7)
Neutrophils Relative %: 77 %
Platelets: 112 10*3/uL — ABNORMAL LOW (ref 150–400)
RBC: 2.97 MIL/uL — ABNORMAL LOW (ref 4.22–5.81)
RDW: 17.9 % — ABNORMAL HIGH (ref 11.5–15.5)
WBC: 5.9 10*3/uL (ref 4.0–10.5)

## 2017-09-06 LAB — COMPREHENSIVE METABOLIC PANEL
ALBUMIN: 2.2 g/dL — AB (ref 3.5–5.0)
ALT: 16 U/L — ABNORMAL LOW (ref 17–63)
AST: 16 U/L (ref 15–41)
Alkaline Phosphatase: 84 U/L (ref 38–126)
Anion gap: 10 (ref 5–15)
BILIRUBIN TOTAL: 0.5 mg/dL (ref 0.3–1.2)
BUN: 58 mg/dL — AB (ref 6–20)
CALCIUM: 9.3 mg/dL (ref 8.9–10.3)
CO2: 28 mmol/L (ref 22–32)
Chloride: 112 mmol/L — ABNORMAL HIGH (ref 101–111)
Creatinine, Ser: 3.15 mg/dL — ABNORMAL HIGH (ref 0.61–1.24)
GFR calc Af Amer: 20 mL/min — ABNORMAL LOW (ref >60)
GFR calc non Af Amer: 17 mL/min — ABNORMAL LOW (ref >60)
GLUCOSE: 91 mg/dL (ref 65–99)
Potassium: 4.4 mmol/L (ref 3.5–5.1)
Sodium: 150 mmol/L — ABNORMAL HIGH (ref 135–145)
Total Protein: 5.1 g/dL — ABNORMAL LOW (ref 6.5–8.1)

## 2017-09-06 LAB — PROTIME-INR
INR: 3.54
Prothrombin Time: 35.2 seconds — ABNORMAL HIGH (ref 11.4–15.2)

## 2017-09-07 ENCOUNTER — Other Ambulatory Visit (HOSPITAL_COMMUNITY)
Admission: RE | Admit: 2017-09-07 | Discharge: 2017-09-07 | Disposition: A | Discharge status: Home / Self Care | Payer: Medicare Other | Source: Skilled Nursing Facility | Attending: Internal Medicine | Admitting: Internal Medicine

## 2017-09-07 ENCOUNTER — Non-Acute Institutional Stay (SKILLED_NURSING_FACILITY): Payer: Medicare Other | Admitting: Internal Medicine

## 2017-09-07 ENCOUNTER — Encounter: Payer: Self-pay | Admitting: Internal Medicine

## 2017-09-07 DIAGNOSIS — I480 Paroxysmal atrial fibrillation: Secondary | ICD-10-CM

## 2017-09-07 DIAGNOSIS — R110 Nausea: Secondary | ICD-10-CM | POA: Diagnosis not present

## 2017-09-07 DIAGNOSIS — J690 Pneumonitis due to inhalation of food and vomit: Secondary | ICD-10-CM

## 2017-09-07 DIAGNOSIS — N179 Acute kidney failure, unspecified: Secondary | ICD-10-CM | POA: Diagnosis not present

## 2017-09-07 DIAGNOSIS — J189 Pneumonia, unspecified organism: Secondary | ICD-10-CM | POA: Insufficient documentation

## 2017-09-07 DIAGNOSIS — Z7901 Long term (current) use of anticoagulants: Secondary | ICD-10-CM | POA: Insufficient documentation

## 2017-09-07 LAB — BASIC METABOLIC PANEL
Anion gap: 8 (ref 5–15)
BUN: 49 mg/dL — ABNORMAL HIGH (ref 6–20)
CHLORIDE: 110 mmol/L (ref 101–111)
CO2: 28 mmol/L (ref 22–32)
Calcium: 9.3 mg/dL (ref 8.9–10.3)
Creatinine, Ser: 3.19 mg/dL — ABNORMAL HIGH (ref 0.61–1.24)
GFR calc Af Amer: 19 mL/min — ABNORMAL LOW (ref >60)
GFR calc non Af Amer: 17 mL/min — ABNORMAL LOW (ref >60)
GLUCOSE: 122 mg/dL — AB (ref 65–99)
Potassium: 4.1 mmol/L (ref 3.5–5.1)
Sodium: 146 mmol/L — ABNORMAL HIGH (ref 135–145)

## 2017-09-07 LAB — PROTIME-INR
INR: 3.9
Prothrombin Time: 37.9 seconds — ABNORMAL HIGH (ref 11.4–15.2)

## 2017-09-07 NOTE — Progress Notes (Signed)
Location:   [facility] Room Number: 134/P Place of Service:  SNF (31) Provider:  Myra Rude, MD  Patient Care Team: Glenda Chroman, MD as PCP - General (Internal Medicine) Jacolyn Reedy, MD as Consulting Physician (Cardiology)  Extended Emergency Contact Information Primary Emergency Contact: [name], [place] [address] [name] of [place] Phone: [phone] Mobile Phone: [phone] Relation: Daughter Secondary Emergency Contact: [name] Address: [place] [place], [place] [address] [place] Phone: [phone] Relation: Daughter  Code Status:  Full Code Goals of care: Advanced Directive information Advanced Directives 09/07/2017  Does Patient Have a Medical Advance Directive? Yes  Type of Advance Directive (No Data)  Does patient want to make changes to medical advance directive? No - Patient declined  Copy of [place] in Chart? -  Would patient like information on creating a medical advance directive? No - Patient declined  Pre-existing out of facility DNR order (yellow form or pink MOST form) -     Chief Complaint  Patient presents with  . Acute Visit    F/U Phneumonia    HPI:  Pt is a 83 y.o. male seen today for an acute visit for Follow up For Pneumonia Patient was admitted to SNF for therapy after undergoing CABG x2 on 08/08/2017.With Right leg Saphenous vein and also has Right Axillary cannulation. It was complicated by ARF requiring Hemodialysis, Aspiration pneumonia and Rapid Atrial fibrilation Patient also has h/o Atrial fibrillation  Was on Chronic Eliquis, Right Pontine Stroke in 2016, Significant hearing loss, Dysphagia.Hypertension, BPH And PVD  Patient was recently seen for Fever and Cough and was diagnosed with Pneumonia. AS he has h/o Aspiration and dysphagia he was started on Augmentin. This is follow up visit. Patient is  afebrile now. But continues to have nausea.. He says he still feels weak and not able to do much therapy. Continues to have cough. Decreased appetite.  Past Medical History:  Diagnosis Date  . BPH (benign prostatic hypertrophy)   . 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 Chronic kidney disease stage 3   . Coronary artery disease    DR. TILLEY IS PT'S CARDIOLGIST  . GERD (gastroesophageal reflux disease)   . Gout   . Hyperlipidemia   . Hypertensive heart disease   . Incontinence of urine   . Memory loss   . Myocardial infarction ([facility] [facility]) 1994  . Osteoarthritis   . Paroxysmal atrial fibrillation (HCC)   . Peripheral vascular disease ([place])   . Personal history of transient ischemic attack (TIA) and cerebral infarction without residual deficit   . Right pontine stroke ([place]) 02/27/2015  . Tooth disease    Past Surgical History:  Procedure Laterality Date  . CARDIAC CATHETERIZATION  07/21/2005  . CORONARY ARTERY BYPASS GRAFT N/A 08/07/2017   Procedure: CORONARY ARTERY BYPASS GRAFTING (CABG) x 2 WITH ENDOSCOPIC HARVESTING OF RIGHT SAPHENOUS VEIN.;  Surgeon: Ivin Poot, MD;  Location: [facility];  Service: Open Heart Surgery;  Laterality: N/A;  . HERNIA REPAIR  12/26/2002   BILATERAL INGUINAL HERNIA REPAIR  . LEFT HEART CATH AND CORONARY ANGIOGRAPHY N/A 08/03/2017   Procedure: LEFT HEART CATH AND CORONARY ANGIOGRAPHY;  Surgeon: Sherren Mocha, MD;  Location: [facility];  Service: Cardiovascular;  Laterality: N/A;  . MASTOID SURGERY  X 5    . PROSTATE SURGERY    . TEE WITHOUT CARDIOVERSION N/A 08/07/2017   Procedure: TRANSESOPHAGEAL ECHOCARDIOGRAM (TEE);  Surgeon: Prescott Gum, Collier Salina, MD;  Location: [facility];  Service: Open Heart Surgery;  Laterality: N/A;  . TOTAL KNEE ARTHROPLASTY Left 03/28/2013   Procedure: LEFT TOTAL KNEE ARTHROPLASTY;  Surgeon: Gearlean Alf, MD;  Location: [facility];  Service: Orthopedics;  Laterality: Left;    Allergies  Allergen Reactions    . Lipitor [Atorvastatin] Other (See Comments)    Severe constipation    Outpatient Encounter Prescriptions as of 09/07/2017  Medication Sig  . acetaminophen (TYLENOL) 500 MG tablet Take 1,000 mg by mouth every 8 (eight) hours as needed.  Marland Kitchen allopurinol (ZYLOPRIM) 300 MG tablet Take 0.5 tablets (150 mg total) by mouth daily.  Marland Kitchen amiodarone (PACERONE) 200 MG tablet Take 1 tablet (200 mg total) by mouth daily.  Marland Kitchen amoxicillin-clavulanate (AUGMENTIN) 500-125 MG tablet Take 1 tablet by mouth 2 (two) times daily.  Marland Kitchen aspirin EC 81 MG EC tablet Take 1 tablet (81 mg total) by mouth daily.  . Cholecalciferol (VITAMIN D3) 2000 units TABS Take 2,000 Units by mouth daily.  Marland Kitchen docusate sodium (COLACE) 100 MG capsule Take 100 mg by mouth daily.  . feeding supplement, ENSURE ENLIVE, (ENSURE ENLIVE) LIQD Take 237 mLs by mouth 2 (two) times daily between meals.  . finasteride (PROSCAR) 5 MG tablet Take 5 mg by mouth daily.  Marland Kitchen HYDROcodone-acetaminophen (NORCO/VICODIN) 5-325 MG tablet Take 1 tablet by mouth every 6 (six) hours as needed for moderate pain.  Marland Kitchen omeprazole (PRILOSEC) 20 MG capsule Take 20 mg by mouth daily.  . ondansetron (ZOFRAN) 4 MG tablet Take 4 mg by mouth every 8 (eight) hours as needed for nausea or vomiting.  . ondansetron (ZOFRAN) 4 MG tablet Take 4 mg by mouth every 6 (six) hours as needed for nausea or vomiting.  . polyethylene glycol (MIRALAX / GLYCOLAX) packet Take 17 g by mouth daily.  . rosuvastatin (CRESTOR) 10 MG tablet Take 1 tablet (10 mg total) by mouth daily.  . [DISCONTINUED] acetaminophen (TYLENOL) 500 MG tablet Take 1,000 mg by mouth every 8 (eight) hours as needed.    No facility-administered encounter medications on file as of 09/07/2017.      Review of Systems  Constitutional: Positive for activity change and appetite change. Negative for chills and fever.  HENT: Negative.   Respiratory: Positive for cough and shortness of breath.   Cardiovascular: Negative.    Gastrointestinal: Positive for constipation and nausea.  Genitourinary: Negative.   Musculoskeletal: Negative.   Skin: Negative.   Neurological: Negative.   Psychiatric/Behavioral: Negative.      There is no immunization history on file for this patient. Pertinent  Health Maintenance Due  Topic Date Due  . INFLUENZA VACCINE  10/24/2017 (Originally 06/24/2017)  . PNA vac Low Risk Adult (1 of 2 - PCV13) 10/24/2017 (Originally 10/20/1999)   Fall Risk  10/27/2016 10/30/2015  Falls in the past year? No No   Functional Status Survey:    Vitals:   09/07/17 1036  BP: 118/61  Pulse: 98  Resp: 20  Temp: 97.8 F (36.6 C)  TempSrc: Oral   There is no height or weight on file to calculate BMI. Physical Exam  Constitutional: He appears well-developed and well-nourished.  HENT:  Head: Normocephalic.  Mouth/Throat: Oropharynx is clear and moist.  Eyes: Pupils are equal, round, and reactive to light.  Neck: Neck supple.  Cardiovascular: Normal rate and  normal heart sounds.   Pulmonary/Chest: Effort normal and breath sounds normal. No respiratory distress. He has no wheezes. He has no rales.  Abdominal: Soft. Bowel sounds are normal. He exhibits no distension. There is no tenderness. There is no rebound.  Musculoskeletal: He exhibits no edema.  Neurological: He is alert.  Skin: Skin is warm and dry.    Labs reviewed:  Recent Labs  08/17/17 0501  08/18/17 0355  08/19/17 0332  08/26/17 0504 08/27/17 0452 08/28/17 1620  09/04/17 0705 09/06/17 0719 09/07/17 0945  NA 133*  < > 131*  < > 130*  < > 138 142 146*  < > 150* 150* 146*  K 3.9  < > 3.3*  < > 3.5  < > 3.6 3.7 4.2  < > 4.4 4.4 4.1  CL 101  < > 97*  < > 96*  < > 101 106 115*  < > 112* 112* 110  CO2 22  < > 21*  --  20*  < > 22 22 23   < > 26 28 28   GLUCOSE 126*  < > 117*  < > 103*  < > 107* 103* 113*  < > 122* 91 122*  BUN 22*  < > 46*  < > 65*  < > 100* 100* 84*  < > 61* 58* 49*  CREATININE 1.42*  < > 2.73*  < > 3.77*  <  > 5.17* 4.79* 3.90*  < > 3.57* 3.15* 3.19*  CALCIUM 7.5*  < > 8.0*  --  8.3*  < > 9.5 9.2 9.2  < > 9.9 9.3 9.3  MG 1.8  --  1.9  --  1.9  --   --   --   --   --   --   --   --   PHOS 2.0*  < > 5.4*  --  5.6*  < > 5.3* 4.8* 4.1  --   --   --   --   < > = values in this interval not displayed.  Recent Labs  08/23/17 0343 08/24/17 0416  08/27/17 0452 08/28/17 1620 09/06/17 0719  AST 20 18  --   --   --  16  ALT 28 25  --   --   --  16*  ALKPHOS 72 65  --   --   --  84  BILITOT 0.8 1.1  --   --   --  0.5  PROT 6.0* 6.2*  --   --   --  5.1*  ALBUMIN 2.4* 2.5*  < > 2.3* 2.3* 2.2*  < > = values in this interval not displayed.  Recent Labs  08/31/17 0630 09/03/17 1020 09/04/17 0705 09/06/17 0719  WBC 6.3 11.6* 7.3 5.9  NEUTROABS 4.8 9.9*  --  4.5  HGB 11.0* 11.4* 11.4* 10.3*  HCT 35.0* 37.7* 37.2* 33.4*  MCV 109.4* 112.9* 113.1* 112.5*  PLT 158 169 128* 112*   Lab Results  Component Value Date   TSH 3.035 08/04/2017   Lab Results  Component Value Date   HGBA1C 5.3 08/04/2017   Lab Results  Component Value Date   CHOL 162 02/28/2015   HDL 31 (L) 02/28/2015   LDLCALC 102 (H) 02/28/2015   TRIG 104 08/17/2017   CHOLHDL 5.2 02/28/2015    Significant Diagnostic Results in last 30 days:  Dg Chest 2 View  Result Date: 08/26/2017 CLINICAL DATA:  Status post CABG on August 07, 2017 EXAM: CHEST  2 VIEW  COMPARISON:  Portable chest x-ray of August 21, 2017. FINDINGS: The lungs are adequately inflated. There is persistent increased density in the retrocardiac region. There is no pneumothorax or significant pleural effusion. The cardiac silhouette is top-normal in size. The pulmonary vascularity is not engorged. The sternal wires are intact. The left PICC line tip projects over the midportion of the SVC. The bony thorax exhibits no acute abnormality. IMPRESSION: Persistent left lower lobe atelectasis or pneumonia. No significant pulmonary edema or pleural effusion. Electronically  Signed   By: David  Martinique M.D.   On: 08/26/2017 07:45   US Renal  Result Date: 08/12/2017 CLINICAL DATA:  Acute kidney injury.  Open heart surgery 08/07/2017 EXAM: RENAL / URINARY TRACT ULTRASOUND COMPLETE COMPARISON:  CT abdomen and pelvis 07/07/2013 FINDINGS: Right Kidney: Length: 10.2 cm. Diffuse renal parenchymal thinning with increased parenchymal echotexture consistent with chronic medical renal disease. No solid mass, stone, or hydronephrosis identified. Left Kidney: Length: 8.5 cm. Diffuse renal parenchymal thinning with increased parenchymal echotexture consistent with chronic medical renal disease. Multiple cysts are demonstrated, largest measuring 8.2 cm in maximal diameter. No solid masses seen. No hydronephrosis. Visualization of the left kidney is limited due to poor penetration. Bladder: Bladder is decompressed with a Foley catheter and is not evaluated. IMPRESSION: Bilateral renal parenchymal atrophy and increased parenchymal echotexture consistent with chronic medical renal disease. No hydronephrosis. Benign-appearing left renal cysts. Electronically Signed   By: Lucienne Capers M.D.   On: 08/12/2017 22:35   Dg Chest Port 1 View  Result Date: 08/21/2017 CLINICAL DATA:  CABG. EXAM: PORTABLE CHEST 1 VIEW COMPARISON:  08/20/2017 . FINDINGS: Dual lumen left subclavian central line in stable position. Prior CABG. Cardiomegaly with normal pulmonary vascularity. Bibasilar atelectasis. Improved bibasilar infiltrates/edema. Tiny bilateral pleural effusions. No pneumothorax. Surgical staples and clips right chest. IMPRESSION: 1. Dual-lumen left subclavian central line stable position. 2. Prior CABG. Cardiomegaly with normal pulmonary vascularity. Improved bibasilar infiltrates/edema. Findings most consistent improving CHF. Tiny bilateral pleural effusions. 3.  Bibasilar atelectasis . Electronically Signed   By: Marcello Moores  Register   On: 08/21/2017 07:44   Dg Chest Port 1 View  Result Date:  08/20/2017 CLINICAL DATA:  Shortness of breath.  Status post CABG 13 days ago. EXAM: PORTABLE CHEST 1 VIEW COMPARISON:  Portable chest x-ray of August 18, 2017 FINDINGS: The lungs are adequately inflated. There remain increased densities at the right lung base and in the left mid and lower lung. There is small left pleural effusion. The cardiac silhouette is enlarged. The central pulmonary vascularity is mildly prominent but stable. The dual-lumen dialysis catheter is in stable position. The PICC line tip lies in the distal portion of the SVC. There are calcifications in the wall of the thoracic aorta. The visualized sternal wires are intact. IMPRESSION: Persistent left lower lobe and right lower lobe atelectasis or pneumonia. Small left pleural effusion. Mild central pulmonary vascular congestion. Electronically Signed   By: David  Martinique M.D.   On: 08/20/2017 07:55   Dg Chest Port 1 View  Result Date: 08/18/2017 CLINICAL DATA:  Shortness of breath.  CABG. EXAM: PORTABLE CHEST 1 VIEW COMPARISON:  08/16/2017. FINDINGS: Left subclavian line left PICC line in stable position. Prior CABG. Stable cardiomegaly. Improvement of basilar interstitial prominence consistent improving CHF. Mild bibasilar atelectasis . No pleural effusion or pneumothorax. Surgical staples and clips right chest. IMPRESSION: 1.  Left subclavian line and left PICC line in stable position. 2. Prior CABG. Stable cardiomegaly. Improving bilateral interstitial prominence consistent improving CHF.  3. Mild basilar atelectasis. Electronically Signed   By: Marcello Moores  Register   On: 08/18/2017 07:15   Dg Chest Port 1 View  Result Date: 08/16/2017 CLINICAL DATA:  Congestive heart failure EXAM: PORTABLE CHEST 1 VIEW COMPARISON:  08/15/2017 FINDINGS: Mild improvement in congestive heart failure with edema. Bilateral effusions left greater than right unchanged. Bibasilar atelectasis left greater than right unchanged. Left arm PICC tip in the lower SVC  unchanged. Left subclavian dual lumen central venous catheter tip in the left innominate vein unchanged. IMPRESSION: Mild improvement in congestive heart failure and edema. Bibasilar atelectasis and effusion left greater than right Electronically Signed   By: Franchot Gallo M.D.   On: 08/16/2017 14:48   Dg Chest Port 1 View  Result Date: 08/15/2017 CLINICAL DATA:  Status post coronary bypass grafting EXAM: PORTABLE CHEST 1 VIEW COMPARISON:  08/14/2017 FINDINGS: Cardiac shadow remains enlarged. Postsurgical changes are again seen. Aortic calcifications are noted. PICC line is noted in the superior aspect of the right atrium stable from the prior exam. The right lung is clear improved from the prior exam. Stable left retrocardiac atelectasis is noted. No pneumothorax is seen. Left subclavian central line is noted as well. IMPRESSION: Stable left retrocardiac atelectasis. Electronically Signed   By: Inez Catalina M.D.   On: 08/15/2017 08:36   Dg Chest Port 1 View  Result Date: 08/14/2017 CLINICAL DATA:  Sore chest after cardiac surgery EXAM: PORTABLE CHEST 1 VIEW COMPARISON:  Yesterday FINDINGS: Low volume chest with hazy opacity from layering effusions. There is vascular congestion and basal atelectasis. No pneumothorax visible. Stable cardiomegaly. Left upper extremity PICC with tip at the distal SVC. IMPRESSION: 1. Stable from yesterday. 2. Low volume chest with atelectasis, effusions, and vascular congestion. Electronically Signed   By: Monte Fantasia M.D.   On: 08/14/2017 08:05   Dg Chest Port 1 View  Result Date: 08/13/2017 CLINICAL DATA:  Ileus, CABG EXAM: PORTABLE CHEST 1 VIEW COMPARISON:  08/12/2017 FINDINGS: Changes of CABG. Left dialysis catheter remains in place, unchanged. Cardiomegaly with vascular congestion and mild pulmonary edema. Small layering effusions. IMPRESSION: Mild CHF with small layering effusions. Findings similar to prior study. Electronically Signed   By: Rolm Baptise M.D.    On: 08/13/2017 09:15   Dg Chest Port 1 View  Result Date: 08/12/2017 CLINICAL DATA:  Central line placement EXAM: PORTABLE CHEST 1 VIEW COMPARISON:  08/12/2017 FINDINGS: Left upper extremity catheter tip overlies the SVC. Larger poor left central venous catheter tip poorly visible, appears to overlie the left brachiocephalic region. No pneumothorax is seen. Post sternotomy changes and cutaneous staples over the right chest. Surgical clips in the right axilla. Worsened bibasilar opacification. Cardiomegaly with aortic atherosclerosis. IMPRESSION: 1. Left-sided central venous catheter tip poorly visible, appears to project over the left brachiocephalic region. No pneumothorax. 2. Cardiomegaly with increased vascular congestion and background edema 3. Worsening airspace disease within the bilateral lung bases. Electronically Signed   By: Donavan Foil M.D.   On: 08/12/2017 19:07   Dg Chest Port 1 View  Result Date: 08/12/2017 CLINICAL DATA:  CABG, chest soreness EXAM: PORTABLE CHEST 1 VIEW COMPARISON:  08/11/2017 FINDINGS: Prior CABG. Left PICC line is unchanged. Cardiomegaly with vascular congestion and mild pulmonary edema. Small bilateral effusions suspected. No pneumothorax. IMPRESSION: Continued CHF pattern.  Small bilateral effusions. Electronically Signed   By: Rolm Baptise M.D.   On: 08/12/2017 08:45   Dg Chest Port 1 View  Result Date: 08/11/2017 CLINICAL DATA:  Chest pain following  coronary bypass grafting EXAM: PORTABLE CHEST 1 VIEW COMPARISON:  08/10/2017 FINDINGS: Cardiac shadow remains enlarged. Left-sided PICC line is again seen. Right jugular sheath has been removed. Postoperative changes are again noted. Mild vascular congestion and interstitial edema is seen stable from the prior exam. Likely small pleural effusions remain. No new focal infiltrate is seen. IMPRESSION: Changes of mild CHF. Electronically Signed   By: Inez Catalina M.D.   On: 08/11/2017 07:48   Dg Chest Port 1  View  Result Date: 08/10/2017 CLINICAL DATA:  Left PICC catheter placement. EXAM: PORTABLE CHEST 1 VIEW COMPARISON:  Chest x-ray from same date. FINDINGS: Normal insertion of a left-sided PICC line with the tip projecting near the cavoatrial junction. Unchanged right internal jugular sheath. Stable cardiomegaly with pulmonary vascular congestion and pulmonary edema. Small bilateral pleural effusions are unchanged. No pneumothorax. No acute osseous abnormality. IMPRESSION: 1. Interval placement of a left-sided PICC line with the tip projecting over the cavoatrial junction. 2. Unchanged cardiomegaly with pulmonary edema and small bilateral pleural effusions. Electronically Signed   By: Titus Dubin M.D.   On: 08/10/2017 13:34   Dg Chest Port 1 View  Result Date: 08/10/2017 CLINICAL DATA:  Status post coronary artery bypass graft. EXAM: PORTABLE CHEST 1 VIEW COMPARISON:  Radiograph of August 09, 2017. FINDINGS: Stable cardiomegaly. Sternotomy wires are noted. Atherosclerosis of thoracic aorta is noted. No pneumothorax is noted. Left-sided chest tube has been removed. Bilateral perihilar and basilar opacities are noted concerning for pulmonary edema with mild associated pleural effusions. Bony thorax is unremarkable. IMPRESSION: Left-sided chest tube has been removed without evidence of pneumothorax. Aortic atherosclerosis. Bilateral pulmonary edema is noted with mild bilateral pleural effusions. Electronically Signed   By: Marijo Conception, M.D.   On: 08/10/2017 07:37   Dg Chest Port 1 View  Result Date: 08/09/2017 CLINICAL DATA:  Follow-up CABG EXAM: PORTABLE CHEST 1 VIEW COMPARISON:  08/08/2017 FINDINGS: Swan-Ganz catheter is been removed. Venous access sheath remains in place. Left chest tube remains in place. No pneumothorax. Slight worsening of bilateral effusions and lower lobe atelectatic changes. IMPRESSION: Slight worsening of bilateral effusions and lower lobe atelectasis. Electronically Signed    By: Nelson Chimes M.D.   On: 08/09/2017 08:57   Dg Abd Portable 1v  Result Date: 08/15/2017 CLINICAL DATA:  Follow-up ileus EXAM: PORTABLE ABDOMEN - 1 VIEW COMPARISON:  08/14/2017 FINDINGS: Scattered large and small bowel gas is noted. No obstructive changes are seen. Contrast material is noted throughout the colon stable from the prior exam. Diverticulosis without findings of diverticulitis is noted. No free air is seen. No obstructive changes are noted. IMPRESSION: No acute abnormality noted. No findings to suggest obstructive change are seen. Electronically Signed   By: Inez Catalina M.D.   On: 08/15/2017 08:35   Dg Abd Portable 1v  Result Date: 08/14/2017 CLINICAL DATA:  Ileus EXAM: PORTABLE ABDOMEN - 1 VIEW COMPARISON:  August 13, 2017 FINDINGS: There is contrast in the colon. There are multiple contrast filled colonic diverticulum. There is no bowel dilatation or air-fluid level to suggest bowel obstruction. No free air. Consolidation noted in left lung base. Ovary pacemaker wires are attached to the right heart. IMPRESSION: No bowel dilatation or air-fluid level to suggest ileus or bowel obstruction. No free air. Multiple colonic filled diverticulum. Persistent left lower lobe consolidation. Right lung base appears clear. Pacemaker wires attached to right heart. Electronically Signed   By: Lowella Grip III M.D.   On: 08/14/2017 08:02   Dg  Abd Portable 1v  Result Date: 08/13/2017 CLINICAL DATA:  Ileus EXAM: PORTABLE ABDOMEN - 1 VIEW COMPARISON:  08/11/2017 FINDINGS: Contrast noted throughout nondistended transverse colon and left colon. Continued gaseous distention of the cecum and ascending colon, stable. Contrast noted within sigmoid diverticula. No small bowel distention. No free air. IMPRESSION: Continued moderate gaseous distention of the right colon. Remainder the colon is decompressed with contrast throughout. Filling of sigmoid diverticula. Electronically Signed   By: Rolm Baptise  M.D.   On: 08/13/2017 09:14   Dg Abd Portable 1v  Result Date: 08/11/2017 CLINICAL DATA:  Evaluate for ileus EXAM: PORTABLE ABDOMEN - 1 VIEW COMPARISON:  None. FINDINGS: Contrast is seen in the colon. The transverse and descending colon are decompressed. The ascending colon is mildly prominent. There is a paucity of small bowel gas but no definitive dilatation. Linear densities projected over the right side of the abdomen may be on or in patient. Recommend clinical correlation. No other acute abnormalities. IMPRESSION: 1. The ascending colon is mildly prominent with decompression of the transverse and descending colon. This is a nonspecific bowel gas pattern. 2. Linear densities projected over the right side of the abdomen may be on or in the patient. Recommend clinical correlation. Electronically Signed   By: Dorise Bullion III M.D   On: 08/11/2017 18:12   Dg Swallowing Func-speech Pathology  Result Date: 08/27/2017 Objective Swallowing Evaluation: Type of Study: MBS-Modified Barium Swallow Study Patient Details Name: Stanley Robles MRN: [id number] Date of Birth: 1932/04/27 Today's Date: 08/27/2017 Time: SLP Start Time (ACUTE ONLY): 1120-SLP Stop Time (ACUTE ONLY): 1140 SLP Time Calculation (min) (ACUTE ONLY): 20 min Past Medical History: Past Medical History: Diagnosis Date . BPH (benign prostatic hypertrophy)  . 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 Chronic kidney disease stage 3  . 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 GERD (gastroesophageal reflux disease)  . Gout  . Hyperlipidemia  . Hypertensive heart disease  . Incontinence of urine  . Memory loss  . Myocardial infarction ([place]) 1994 . Osteoarthritis  . Paroxysmal atrial fibrillation ([facility])  . Peripheral vascular disease ([place])  . Personal history of transient ischemic attack (TIA) and cerebral infarction without residual deficit  . Right pontine stroke ([facility]) 02/27/2015 . Tooth disease  Past Surgical  History: Past Surgical History: Procedure Laterality Date . CARDIAC CATHETERIZATION  07/21/2005 . CORONARY ARTERY BYPASS GRAFT N/A 08/07/2017  Procedure: CORONARY ARTERY BYPASS GRAFTING (CABG) x 2 WITH ENDOSCOPIC HARVESTING OF RIGHT SAPHENOUS VEIN.;  Surgeon: Ivin Poot, MD;  Location: [facility];  Service: Open Heart Surgery;  Laterality: N/A; . HERNIA REPAIR  12/26/2002  BILATERAL INGUINAL HERNIA REPAIR . LEFT HEART CATH AND CORONARY ANGIOGRAPHY N/A 08/03/2017  Procedure: LEFT HEART CATH AND CORONARY ANGIOGRAPHY;  Surgeon: Sherren Mocha, MD;  Location: [facility];  Service: Cardiovascular;  Laterality: N/A; . MASTOID SURGERY X 5   . PROSTATE SURGERY   . TEE WITHOUT CARDIOVERSION N/A 08/07/2017  Procedure: TRANSESOPHAGEAL ECHOCARDIOGRAM (TEE);  Surgeon: Prescott Gum, Collier Salina, MD;  Location: [place];  Service: Open Heart Surgery;  Laterality: N/A; . TOTAL KNEE ARTHROPLASTY Left 03/28/2013  Procedure: LEFT TOTAL KNEE ARTHROPLASTY;  Surgeon: Gearlean Alf, MD;  Location: [facility];  Service: Orthopedics;  Laterality: Left; HPI: Pt is an 83 yo male admitted with NSTEMI s/p cath which showed severe left main disease and total right coronary artery stenosis, potential CABG planned for 9/14. PMhx: CVA, sensorineural hearing  loss, HLD, PVD, pAF, CKD stage 3, HTN, NSTEMI, chronic diastolic heart failure, and dysphagia from pontine CVA. Was evaluated by ST this admission prior to CABG; MBS 08/05/17 revealed mild oropharyngeal and cervical esophageal dysphagia, suspected to be more chronic in nature given his h/o dysphagia from prior CVA and h/o GERD. Dys 3, thin liquids, no straws recommended with ST to follow briefly through surgery given his history. Pt was intubated for CABG on 9/14, transported back to floor on vent and subsequent extubated at 10pm same day. Pt was consuming Dys 2, nectar > developed ileus > NPO and diagnostic reassessment this morning.   Subjective: RN reports pt coughing with PO s/p CABG Assessment / Plan /  Recommendation [facility] IP CLINICAL IMPRESSIONS 08/27/2017 Clinical Impression Patient with a mild motor based oropharyngeal dysphagia. Patient with oral holding of bolus at the base of tongue in preparation for swallow, at times in excess of 5 seconds, however swallow initiated either at or above the level of the vallecula with all solid and nectar thick liquid consistencies. With thin liquids however, swallow is delayed to the level of the pyriform sinuses which when combined with decreased laryngeal closure, results in deep silent penetration to the vocal cords. although occurring in only 25-30% of trials, cued cough weak and unsuccessful to clear hte airway resulting in a significant risk of post swallow aspiration. Given decreased functional reserve with acute illness, recommend dysphagia 3 solids with nectar thick liquids. Prognosis for ability to advance diet good with improved medical status and conditioning. Will f/u.  SLP Visit Diagnosis Dysphagia, unspecified (R13.10) Attention and concentration deficit following -- Frontal lobe and executive function deficit following -- Impact on safety and function Moderate aspiration risk   [facility] IP TREATMENT RECOMMENDATION 08/27/2017 Treatment Recommendations Therapy as outlined in treatment plan below   Prognosis 08/27/2017 Prognosis for Safe Diet Advancement Good Barriers to Reach Goals -- Barriers/Prognosis Comment -- [facility] IP DIET RECOMMENDATION 08/27/2017 SLP Diet Recommendations Dysphagia 3 (Mech soft) solids;Nectar thick liquid Liquid Administration via Cup;No straw Medication Administration Whole meds with puree Compensations Slow rate;Small sips/bites Postural Changes Seated upright at 90 degrees   [facility] IP OTHER RECOMMENDATIONS 08/27/2017 Recommended Consults -- Oral Care Recommendations Oral care BID Other Recommendations Order thickener from pharmacy;Prohibited food (jello, ice cream, thin soups);Remove water pitcher   [facility] IP FOLLOW UP RECOMMENDATIONS 08/27/2017 Follow up  Recommendations Skilled Nursing facility   [facility] IP FREQUENCY AND DURATION 08/27/2017 Speech Therapy Frequency (ACUTE ONLY) min 2x/week Treatment Duration 2 weeks      [facility] IP ORAL PHASE 08/27/2017 Oral Phase Impaired Oral - Pudding Teaspoon -- Oral - Pudding Cup -- Oral - Honey Teaspoon -- Oral - Honey Cup -- Oral - Nectar Teaspoon Delayed oral transit;Lingual pumping;Holding of bolus Oral - Nectar Cup Delayed oral transit;Lingual pumping;Holding of bolus Oral - Nectar Straw -- Oral - Thin Teaspoon -- Oral - Thin Cup Delayed oral transit;Lingual pumping;Holding of bolus Oral - Thin Straw Delayed oral transit;Lingual pumping;Holding of bolus Oral - Puree Delayed oral transit;Lingual pumping;Holding of bolus Oral - Mech Soft Delayed oral transit;Lingual pumping;Holding of bolus Oral - Regular -- Oral - Multi-Consistency -- Oral - Pill NT Oral Phase - Comment --  [facility] IP PHARYNGEAL PHASE 08/27/2017 Pharyngeal Phase Impaired Pharyngeal- Pudding Teaspoon -- Pharyngeal -- Pharyngeal- Pudding Cup -- Pharyngeal -- Pharyngeal- Honey Teaspoon -- Pharyngeal -- Pharyngeal- Honey Cup -- Pharyngeal -- Pharyngeal- Nectar Teaspoon Delayed swallow initiation-vallecula;Reduced tongue base retraction;Pharyngeal residue - valleculae Pharyngeal -- Pharyngeal- Nectar Cup Delayed swallow  initiation-vallecula;Reduced tongue base retraction;Pharyngeal residue - valleculae Pharyngeal -- Pharyngeal- Nectar Straw -- Pharyngeal -- Pharyngeal- Thin Teaspoon Delayed swallow initiation-pyriform sinuses;Reduced tongue base retraction;Pharyngeal residue - valleculae;Reduced anterior laryngeal mobility Pharyngeal -- Pharyngeal- Thin Cup Delayed swallow initiation-pyriform sinuses;Penetration/Aspiration during swallow;Reduced tongue base retraction;Pharyngeal residue - valleculae;Reduced anterior laryngeal mobility Pharyngeal Material enters airway, CONTACTS cords and not ejected out Pharyngeal- Thin Straw Delayed swallow initiation-pyriform sinuses;Reduced  tongue base retraction;Reduced anterior laryngeal mobility;Penetration/Aspiration during swallow;Pharyngeal residue - valleculae Pharyngeal Material enters airway, CONTACTS cords and not ejected out Pharyngeal- Puree Delayed swallow initiation-vallecula;Reduced tongue base retraction;Pharyngeal residue - valleculae Pharyngeal -- Pharyngeal- Mechanical Soft Delayed swallow initiation-vallecula;Reduced tongue base retraction;Pharyngeal residue - valleculae Pharyngeal -- Pharyngeal- Regular -- Pharyngeal -- Pharyngeal- Multi-consistency -- Pharyngeal -- Pharyngeal- Pill NT Pharyngeal -- Pharyngeal Comment --  [facility] IP CERVICAL ESOPHAGEAL PHASE 08/27/2017 Cervical Esophageal Phase WFL Pudding Teaspoon -- Pudding Cup -- Honey Teaspoon -- Honey Cup -- Nectar Teaspoon -- Nectar Cup -- Nectar Straw -- Thin Teaspoon -- Thin Cup -- Thin Straw -- Puree -- Mechanical Soft -- Regular -- Multi-consistency -- Pill -- Cervical Esophageal Comment -- [facility] IP GO 03/28/2015 Functional Assessment Tool Used MBSS Functional Limitations Swallowing Swallow Current Status (X3818) CI Swallow Goal Status (E9937) CI Swallow Discharge Status (J6967) CI Motor Speech Current Status (E9381) (None) Motor Speech Goal Status (O1751) (None) Motor Speech Goal Status (W2585) (None) Spoken Language Comprehension Current Status (I7782) (None) Spoken Language Comprehension Goal Status (U2353) (None) Spoken Language Comprehension Discharge Status (I1443) (None) Spoken Language Expression Current Status (X5400) (None) Spoken Language Expression Goal Status (Q6761) (None) Spoken Language Expression Discharge Status (P5093) (None) Attention Current Status (O6712) (None) Attention Goal Status (W5809) (None) Attention Discharge Status (X8338) (None) Memory Current Status (S5053) (None) Memory Goal Status (Z7673) (None) Memory Discharge Status (A1937) (None) Voice Current Status (T0240) (None) Voice Goal Status (X7353) (None) Voice Discharge Status (G9924) (None) Other  Speech-Language Pathology Functional Limitation Current Status (Q6834) (None) Other Speech-Language Pathology Functional Limitation Goal Status (H9622) (None) Other Speech-Language Pathology Functional Limitation Discharge Status [phone]) (None) Gabriel Rainwater MA, CCC-SLP [phone] McCoy Leah Meryl 08/27/2017, 1:55 PM              US Abdomen Limited Ruq  Result Date: 08/12/2017 CLINICAL DATA:  Vomiting. EXAM: ULTRASOUND ABDOMEN LIMITED RIGHT UPPER QUADRANT COMPARISON:  CT scan of July 07, 2013. FINDINGS: Gallbladder: No gallstones or wall thickening visualized. No sonographic Murphy sign noted by sonographer. Sludge is noted in the gallbladder. Common bile duct: Diameter: 3 mm which is within normal limits. Liver: No focal lesion identified, although left hepatic lobe is not visualized due to overlying bandage. Within normal limits in parenchymal echogenicity. Portal vein is patent on color Doppler imaging with normal direction of blood flow towards the liver. IMPRESSION: Exam is somewhat limited due to overlying bandage. Sludge is noted in gallbladder lumen. No other definite abnormality seen in the right upper quadrant of the abdomen. Electronically Signed   By: Marijo Conception, M.D.   On: 08/12/2017 09:45    Assessment/Plan Left Lower Lobe Pneumonia  Patient is now on Augmentin. He is afebrile and doing little Better. Patient is schedule for Swallowing Study  Nausea Patient continues to have nausea. His appetite is staying Poor. His Nausea can be Multifactorial. ? Due to Antibiotics or Uremia. Will start him on Standing dose of Zofran and also Check Lipase. If no Improvement Possible  GI consult. Will d/w Family S/P CABG Patient is working with therapy. Follow with CVTS  Acute  on Chronic renal failure Patient BUN is improving. Follow up with Nephrology. He has told Nephrology many times now that he is not going to do dialysis.  Vaso cath has been removed. His weight is decreased due to  dehydration.  Atrial fibrillation His Metoprolol was on Hold will restart as has mild tachycardia now. Also on Diltiazem and Amiodarone. Also on Coumadin which is on hold due to mildly elevated INR. Repeat INR in 2 days. Daughter wants him to go on Eliquis eventually.  Hypernatremia D/W daughter and patient his Nausea can be related to Uremia Will use Zofran and Encourage PO fluids  CAD with Hyperlipidemia Is on Statin and aspirin Anemia Post op and Chronic renal disease. Got Aranesp 60  Next due 10/9 Constipation Will treat it aggressively  And start him on Linzess. Family/ staff Communication:   Labs/tests ordered:  INR, CMP, Lipase  Total time spent in this patient care encounter was 45_ minutes; greater than 50% of the visit spent counseling patient, reviewing records , Labs and coordinating care for problems addressed at this encounter.

## 2017-09-08 ENCOUNTER — Other Ambulatory Visit (HOSPITAL_COMMUNITY)
Admission: AD | Admit: 2017-09-08 | Discharge: 2017-09-08 | Disposition: A | Discharge status: Home / Self Care | Payer: Medicare Other | Source: Skilled Nursing Facility | Attending: Internal Medicine | Admitting: Internal Medicine

## 2017-09-08 ENCOUNTER — Encounter: Payer: Self-pay | Admitting: Internal Medicine

## 2017-09-08 ENCOUNTER — Non-Acute Institutional Stay (SKILLED_NURSING_FACILITY): Payer: Medicare Other | Admitting: Internal Medicine

## 2017-09-08 ENCOUNTER — Encounter (HOSPITAL_COMMUNITY): Payer: Self-pay

## 2017-09-08 ENCOUNTER — Ambulatory Visit (HOSPITAL_COMMUNITY): Payer: Medicare Other | Admitting: Speech Pathology

## 2017-09-08 ENCOUNTER — Inpatient Hospital Stay (HOSPITAL_COMMUNITY): Admit: 2017-09-08 | Payer: Medicare Other

## 2017-09-08 DIAGNOSIS — N183 Chronic kidney disease, stage 3 unspecified: Secondary | ICD-10-CM

## 2017-09-08 DIAGNOSIS — R112 Nausea with vomiting, unspecified: Secondary | ICD-10-CM | POA: Diagnosis not present

## 2017-09-08 DIAGNOSIS — J690 Pneumonitis due to inhalation of food and vomit: Secondary | ICD-10-CM

## 2017-09-08 DIAGNOSIS — Z951 Presence of aortocoronary bypass graft: Secondary | ICD-10-CM

## 2017-09-08 LAB — CBC WITH DIFFERENTIAL/PLATELET
Basophils Absolute: 0 10*3/uL (ref 0.0–0.1)
Basophils Relative: 0 %
EOS PCT: 1 %
Eosinophils Absolute: 0.1 10*3/uL (ref 0.0–0.7)
HCT: 35.8 % — ABNORMAL LOW (ref 39.0–52.0)
HEMOGLOBIN: 11.1 g/dL — AB (ref 13.0–17.0)
LYMPHS PCT: 8 %
Lymphs Abs: 0.9 10*3/uL (ref 0.7–4.0)
MCH: 34.4 pg — ABNORMAL HIGH (ref 26.0–34.0)
MCHC: 31 g/dL (ref 30.0–36.0)
MCV: 110.8 fL — AB (ref 78.0–100.0)
MONOS PCT: 5 %
Monocytes Absolute: 0.6 10*3/uL (ref 0.1–1.0)
NEUTROS PCT: 86 %
Neutro Abs: 10.1 10*3/uL — ABNORMAL HIGH (ref 1.7–7.7)
PLATELETS: 138 10*3/uL — AB (ref 150–400)
RBC: 3.23 MIL/uL — AB (ref 4.22–5.81)
RDW: 17.4 % — AB (ref 11.5–15.5)
WBC: 11.7 10*3/uL — AB (ref 4.0–10.5)

## 2017-09-08 LAB — COMPREHENSIVE METABOLIC PANEL
ALT: 16 U/L — ABNORMAL LOW (ref 17–63)
ANION GAP: 11 (ref 5–15)
AST: 19 U/L (ref 15–41)
Albumin: 2.4 g/dL — ABNORMAL LOW (ref 3.5–5.0)
Alkaline Phosphatase: 91 U/L (ref 38–126)
BUN: 55 mg/dL — ABNORMAL HIGH (ref 6–20)
CALCIUM: 9.5 mg/dL (ref 8.9–10.3)
CO2: 25 mmol/L (ref 22–32)
Chloride: 109 mmol/L (ref 101–111)
Creatinine, Ser: 3.68 mg/dL — ABNORMAL HIGH (ref 0.61–1.24)
GFR, EST AFRICAN AMERICAN: 16 mL/min — AB (ref >60)
GFR, EST NON AFRICAN AMERICAN: 14 mL/min — AB (ref >60)
Glucose, Bld: 141 mg/dL — ABNORMAL HIGH (ref 65–99)
Potassium: 4.2 mmol/L (ref 3.5–5.1)
SODIUM: 145 mmol/L (ref 135–145)
Total Bilirubin: 0.6 mg/dL (ref 0.3–1.2)
Total Protein: 5.8 g/dL — ABNORMAL LOW (ref 6.5–8.1)

## 2017-09-08 LAB — PROTIME-INR
INR: 2.44
Prothrombin Time: 26.3 seconds — ABNORMAL HIGH (ref 11.4–15.2)

## 2017-09-08 NOTE — Progress Notes (Signed)
Location:   [facility] Room Number: 134/P Place of Service:  SNF (31) Provider:  Myra Rude, MD  Patient Care Team: Glenda Chroman, MD as PCP - General (Internal Medicine) Jacolyn Reedy, MD as Consulting Physician (Cardiology)  Extended Emergency Contact Information Primary Emergency Contact: [name], [place] [address] [name] of [place] Phone: [phone] Mobile Phone: [phone] Relation: Daughter Secondary Emergency Contact: [name] Address: [address], [place] [address] [place] Phone: [phone] Relation: Daughter  Code Status:  Full Code Goals of care: Advanced Directive information Advanced Directives 09/08/2017  Does Patient Have a Medical Advance Directive? Yes  Type of Advance Directive (No Data)  Does patient want to make changes to medical advance directive? No - Patient declined  Copy of Alpha in Chart? -  Would patient like information on creating a medical advance directive? No - Patient declined  Pre-existing out of facility DNR order (yellow form or pink MOST form) -     Chief Complaint  Patient presents with  . Acute Visit    Vomiting    HPI:  Pt is a 83 y.o. male seen today for an acute visit for Continuous Vomiting.  Patient was admitted to SNF for therapy after undergoing CABG x2 on 08/08/2017.With Right leg Saphenous vein and also has Right Axillary cannulation. It was complicated by ARF requiring Hemodialysis, Aspiration pneumonia and Rapid Atrial fibrilation  Patient also has h/o Atrial fibrillation Was on Chronic Eliquis, Right Pontine Stroke in 2016, Significant hearing loss, Dysphagia.Hypertension, BPH And PVD  Patient was recently started on Augmentin for Aspiration pneumonia. He Continues to be on Modified diet due to his Dysphagia. Patient threw up in therapy yesterday and also had 1 episode of  Emesis in the afternoon. He c/o Lower abdominal Discomfort. He says he feels weak and tired.  D/W Therapy. And nurses. His Appetite is still poor. He is not doing any therapy. Gives in easily. C/o Weak. He also has said he wants to be left alone. No fever or Chills. Continue cough. No SOB. Past Medical History:  Diagnosis Date  . BPH (benign prostatic hypertrophy)   . Cancer ([facility])    CANCEROUS MOLE REMOVED FROM BACK - SEVERAL YRS AGO  . Chronic kidney disease stage 3   . Coronary artery disease    DR. TILLEY IS PT'S CARDIOLGIST  . GERD (gastroesophageal reflux disease)   . Gout   . Hyperlipidemia   . Hypertensive heart disease   . Incontinence of urine   . Memory loss   . Myocardial infarction ([facility]) 1994  . Osteoarthritis   . Paroxysmal atrial fibrillation (HCC)   . Peripheral vascular disease ([place] [facility])   . Personal history of transient ischemic attack (TIA) and cerebral infarction without residual deficit   . Right pontine stroke ([facility]) 02/27/2015  . Tooth disease    Past Surgical History:  Procedure Laterality Date  . CARDIAC CATHETERIZATION  07/21/2005  . CORONARY ARTERY BYPASS GRAFT N/A 08/07/2017   Procedure: CORONARY ARTERY BYPASS GRAFTING (CABG) x 2 WITH ENDOSCOPIC HARVESTING OF RIGHT SAPHENOUS VEIN.;  Surgeon: Ivin Poot, MD;  Location: [facility];  Service: Open Heart Surgery;  Laterality: N/A;  . HERNIA REPAIR  12/26/2002   BILATERAL INGUINAL HERNIA REPAIR  . LEFT HEART CATH AND CORONARY ANGIOGRAPHY N/A 08/03/2017   Procedure: LEFT  HEART CATH AND CORONARY ANGIOGRAPHY;  Surgeon: Sherren Mocha, MD;  Location: [facility];  Service: Cardiovascular;  Laterality: N/A;  . MASTOID SURGERY X 5    . PROSTATE SURGERY    . TEE WITHOUT CARDIOVERSION N/A 08/07/2017   Procedure: TRANSESOPHAGEAL ECHOCARDIOGRAM (TEE);  Surgeon: Prescott Gum, Collier Salina, MD;  Location: [facility];  Service: Open Heart Surgery;  Laterality: N/A;  . TOTAL KNEE ARTHROPLASTY Left 03/28/2013   Procedure: LEFT TOTAL KNEE  ARTHROPLASTY;  Surgeon: Gearlean Alf, MD;  Location: [facility];  Service: Orthopedics;  Laterality: Left;    Allergies  Allergen Reactions  . Lipitor [Atorvastatin] Other (See Comments)    Severe constipation    Outpatient Encounter Prescriptions as of 09/08/2017  Medication Sig  . acetaminophen (TYLENOL) 500 MG tablet Take 1,000 mg by mouth every 8 (eight) hours as needed.  Marland Kitchen allopurinol (ZYLOPRIM) 300 MG tablet Take 0.5 tablets (150 mg total) by mouth daily.  Marland Kitchen amiodarone (PACERONE) 200 MG tablet Take 1 tablet (200 mg total) by mouth daily.  Marland Kitchen amoxicillin-clavulanate (AUGMENTIN) 500-125 MG tablet Take 1 tablet by mouth 2 (two) times daily.  Marland Kitchen aspirin EC 81 MG EC tablet Take 1 tablet (81 mg total) by mouth daily.  . Cholecalciferol (VITAMIN D3) 2000 units TABS Take 2,000 Units by mouth daily.  Marland Kitchen docusate sodium (COLACE) 100 MG capsule Take 100 mg by mouth daily.  . feeding supplement, ENSURE ENLIVE, (ENSURE ENLIVE) LIQD Take 237 mLs by mouth 2 (two) times daily between meals.  . finasteride (PROSCAR) 5 MG tablet Take 5 mg by mouth daily.  Marland Kitchen HYDROcodone-acetaminophen (NORCO/VICODIN) 5-325 MG tablet Take 1 tablet by mouth every 6 (six) hours as needed for moderate pain.  Marland Kitchen linaclotide (LINZESS) 72 MCG capsule Take 72 mcg by mouth daily before breakfast.  . metoprolol tartrate (LOPRESSOR) 25 MG tablet Take 12.5 mg by mouth 2 (two) times daily.  Marland Kitchen omeprazole (PRILOSEC) 20 MG capsule Take 20 mg by mouth daily.  . ondansetron (ZOFRAN) 4 MG tablet Take 4 mg by mouth every 8 (eight) hours as needed for nausea or vomiting.  . ondansetron (ZOFRAN) 4 MG tablet Take 4 mg by mouth every 6 (six) hours as needed for nausea or vomiting.  . polyethylene glycol (MIRALAX / GLYCOLAX) packet Take 17 g by mouth daily.  . rosuvastatin (CRESTOR) 10 MG tablet Take 1 tablet (10 mg total) by mouth daily.   No facility-administered encounter medications on file as of 09/08/2017.      Review of Systems    Constitutional: Positive for activity change, appetite change and fatigue. Negative for chills, diaphoresis and fever.  HENT: Negative.   Respiratory: Positive for cough. Negative for shortness of breath.   Cardiovascular: Negative.   Gastrointestinal: Positive for constipation, nausea and vomiting.  Genitourinary: Negative.   Musculoskeletal: Negative.   Skin: Negative.   Neurological: Positive for weakness.  Psychiatric/Behavioral: Positive for dysphoric mood.  All other systems reviewed and are negative.    There is no immunization history on file for this patient. Pertinent  Health Maintenance Due  Topic Date Due  . INFLUENZA VACCINE  10/24/2017 (Originally 06/24/2017)  . PNA vac Low Risk Adult (1 of 2 - PCV13) 10/24/2017 (Originally 10/20/1999)   Fall Risk  10/27/2016 10/30/2015  Falls in the past year? No No   Functional Status Survey:    Vitals:   09/08/17 0933  BP: (!) 111/52  Pulse: 87  Resp: 20  Temp: (!) 97 F (36.1 C)  TempSrc:  Oral   There is no height or weight on file to calculate BMI. Physical Exam  Constitutional: He is oriented to person, place, and time. He appears well-developed and well-nourished.  HENT:  Head: Normocephalic.  Mouth/Throat: Oropharynx is clear and moist.  Eyes: Pupils are equal, round, and reactive to light.  Neck: Neck supple.  Cardiovascular: Normal rate and normal heart sounds.   Pulmonary/Chest: Effort normal and breath sounds normal. No respiratory distress. He has no wheezes.  Abdominal: Soft. Bowel sounds are normal. He exhibits no distension. There is no tenderness. There is no rebound.  Musculoskeletal: He exhibits no edema.  Neurological: He is alert and oriented to person, place, and time.  Skin: Skin is warm and dry.  Psychiatric: He has a normal mood and affect. His behavior is normal. Thought content normal.    Labs reviewed:  Recent Labs  08/17/17 0501  08/18/17 0355  08/19/17 0332  08/26/17 0504  08/27/17 0452 08/28/17 1620  09/04/17 0705 09/06/17 0719 09/07/17 0945  NA 133*  < > 131*  < > 130*  < > 138 142 146*  < > 150* 150* 146*  K 3.9  < > 3.3*  < > 3.5  < > 3.6 3.7 4.2  < > 4.4 4.4 4.1  CL 101  < > 97*  < > 96*  < > 101 106 115*  < > 112* 112* 110  CO2 22  < > 21*  --  20*  < > 22 22 23   < > 26 28 28   GLUCOSE 126*  < > 117*  < > 103*  < > 107* 103* 113*  < > 122* 91 122*  BUN 22*  < > 46*  < > 65*  < > 100* 100* 84*  < > 61* 58* 49*  CREATININE 1.42*  < > 2.73*  < > 3.77*  < > 5.17* 4.79* 3.90*  < > 3.57* 3.15* 3.19*  CALCIUM 7.5*  < > 8.0*  --  8.3*  < > 9.5 9.2 9.2  < > 9.9 9.3 9.3  MG 1.8  --  1.9  --  1.9  --   --   --   --   --   --   --   --   PHOS 2.0*  < > 5.4*  --  5.6*  < > 5.3* 4.8* 4.1  --   --   --   --   < > = values in this interval not displayed.  Recent Labs  08/23/17 0343 08/24/17 0416  08/27/17 0452 08/28/17 1620 09/06/17 0719  AST 20 18  --   --   --  16  ALT 28 25  --   --   --  16*  ALKPHOS 72 65  --   --   --  84  BILITOT 0.8 1.1  --   --   --  0.5  PROT 6.0* 6.2*  --   --   --  5.1*  ALBUMIN 2.4* 2.5*  < > 2.3* 2.3* 2.2*  < > = values in this interval not displayed.  Recent Labs  08/31/17 0630 09/03/17 1020 09/04/17 0705 09/06/17 0719  WBC 6.3 11.6* 7.3 5.9  NEUTROABS 4.8 9.9*  --  4.5  HGB 11.0* 11.4* 11.4* 10.3*  HCT 35.0* 37.7* 37.2* 33.4*  MCV 109.4* 112.9* 113.1* 112.5*  PLT 158 169 128* 112*   Lab Results  Component Value Date   TSH 3.035 08/04/2017  Lab Results  Component Value Date   HGBA1C 5.3 08/04/2017   Lab Results  Component Value Date   CHOL 162 02/28/2015   HDL 31 (L) 02/28/2015   LDLCALC 102 (H) 02/28/2015   TRIG 104 08/17/2017   CHOLHDL 5.2 02/28/2015    Significant Diagnostic Results in last 30 days:  Dg Chest 2 View  Result Date: 08/26/2017 CLINICAL DATA:  Status post CABG on August 07, 2017 EXAM: CHEST  2 VIEW COMPARISON:  Portable chest x-ray of August 21, 2017. FINDINGS: The lungs are  adequately inflated. There is persistent increased density in the retrocardiac region. There is no pneumothorax or significant pleural effusion. The cardiac silhouette is top-normal in size. The pulmonary vascularity is not engorged. The sternal wires are intact. The left PICC line tip projects over the midportion of the SVC. The bony thorax exhibits no acute abnormality. IMPRESSION: Persistent left lower lobe atelectasis or pneumonia. No significant pulmonary edema or pleural effusion. Electronically Signed   By: David  Martinique M.D.   On: 08/26/2017 07:45   US Renal  Result Date: 08/12/2017 CLINICAL DATA:  Acute kidney injury.  Open heart surgery 08/07/2017 EXAM: RENAL / URINARY TRACT ULTRASOUND COMPLETE COMPARISON:  CT abdomen and pelvis 07/07/2013 FINDINGS: Right Kidney: Length: 10.2 cm. Diffuse renal parenchymal thinning with increased parenchymal echotexture consistent with chronic medical renal disease. No solid mass, stone, or hydronephrosis identified. Left Kidney: Length: 8.5 cm. Diffuse renal parenchymal thinning with increased parenchymal echotexture consistent with chronic medical renal disease. Multiple cysts are demonstrated, largest measuring 8.2 cm in maximal diameter. No solid masses seen. No hydronephrosis. Visualization of the left kidney is limited due to poor penetration. Bladder: Bladder is decompressed with a Foley catheter and is not evaluated. IMPRESSION: Bilateral renal parenchymal atrophy and increased parenchymal echotexture consistent with chronic medical renal disease. No hydronephrosis. Benign-appearing left renal cysts. Electronically Signed   By: Lucienne Capers M.D.   On: 08/12/2017 22:35   Dg Chest Port 1 View  Result Date: 08/21/2017 CLINICAL DATA:  CABG. EXAM: PORTABLE CHEST 1 VIEW COMPARISON:  08/20/2017 . FINDINGS: Dual lumen left subclavian central line in stable position. Prior CABG. Cardiomegaly with normal pulmonary vascularity. Bibasilar atelectasis. Improved  bibasilar infiltrates/edema. Tiny bilateral pleural effusions. No pneumothorax. Surgical staples and clips right chest. IMPRESSION: 1. Dual-lumen left subclavian central line stable position. 2. Prior CABG. Cardiomegaly with normal pulmonary vascularity. Improved bibasilar infiltrates/edema. Findings most consistent improving CHF. Tiny bilateral pleural effusions. 3.  Bibasilar atelectasis . Electronically Signed   By: Marcello Moores  Register   On: 08/21/2017 07:44   Dg Chest Port 1 View  Result Date: 08/20/2017 CLINICAL DATA:  Shortness of breath.  Status post CABG 13 days ago. EXAM: PORTABLE CHEST 1 VIEW COMPARISON:  Portable chest x-ray of August 18, 2017 FINDINGS: The lungs are adequately inflated. There remain increased densities at the right lung base and in the left mid and lower lung. There is small left pleural effusion. The cardiac silhouette is enlarged. The central pulmonary vascularity is mildly prominent but stable. The dual-lumen dialysis catheter is in stable position. The PICC line tip lies in the distal portion of the SVC. There are calcifications in the wall of the thoracic aorta. The visualized sternal wires are intact. IMPRESSION: Persistent left lower lobe and right lower lobe atelectasis or pneumonia. Small left pleural effusion. Mild central pulmonary vascular congestion. Electronically Signed   By: David  Martinique M.D.   On: 08/20/2017 07:55   Dg Chest [facility] 1 View  Result  Date: 08/18/2017 CLINICAL DATA:  Shortness of breath.  CABG. EXAM: PORTABLE CHEST 1 VIEW COMPARISON:  08/16/2017. FINDINGS: Left subclavian line left PICC line in stable position. Prior CABG. Stable cardiomegaly. Improvement of basilar interstitial prominence consistent improving CHF. Mild bibasilar atelectasis . No pleural effusion or pneumothorax. Surgical staples and clips right chest. IMPRESSION: 1.  Left subclavian line and left PICC line in stable position. 2. Prior CABG. Stable cardiomegaly. Improving bilateral  interstitial prominence consistent improving CHF. 3. Mild basilar atelectasis. Electronically Signed   By: Marcello Moores  Register   On: 08/18/2017 07:15   Dg Chest Port 1 View  Result Date: 08/16/2017 CLINICAL DATA:  Congestive heart failure EXAM: PORTABLE CHEST 1 VIEW COMPARISON:  08/15/2017 FINDINGS: Mild improvement in congestive heart failure with edema. Bilateral effusions left greater than right unchanged. Bibasilar atelectasis left greater than right unchanged. Left arm PICC tip in the lower SVC unchanged. Left subclavian dual lumen central venous catheter tip in the left innominate vein unchanged. IMPRESSION: Mild improvement in congestive heart failure and edema. Bibasilar atelectasis and effusion left greater than right Electronically Signed   By: Franchot Gallo M.D.   On: 08/16/2017 14:48   Dg Chest Port 1 View  Result Date: 08/15/2017 CLINICAL DATA:  Status post coronary bypass grafting EXAM: PORTABLE CHEST 1 VIEW COMPARISON:  08/14/2017 FINDINGS: Cardiac shadow remains enlarged. Postsurgical changes are again seen. Aortic calcifications are noted. PICC line is noted in the superior aspect of the right atrium stable from the prior exam. The right lung is clear improved from the prior exam. Stable left retrocardiac atelectasis is noted. No pneumothorax is seen. Left subclavian central line is noted as well. IMPRESSION: Stable left retrocardiac atelectasis. Electronically Signed   By: Inez Catalina M.D.   On: 08/15/2017 08:36   Dg Chest Port 1 View  Result Date: 08/14/2017 CLINICAL DATA:  Sore chest after cardiac surgery EXAM: PORTABLE CHEST 1 VIEW COMPARISON:  Yesterday FINDINGS: Low volume chest with hazy opacity from layering effusions. There is vascular congestion and basal atelectasis. No pneumothorax visible. Stable cardiomegaly. Left upper extremity PICC with tip at the distal SVC. IMPRESSION: 1. Stable from yesterday. 2. Low volume chest with atelectasis, effusions, and vascular congestion.  Electronically Signed   By: Monte Fantasia M.D.   On: 08/14/2017 08:05   Dg Chest Port 1 View  Result Date: 08/13/2017 CLINICAL DATA:  Ileus, CABG EXAM: PORTABLE CHEST 1 VIEW COMPARISON:  08/12/2017 FINDINGS: Changes of CABG. Left dialysis catheter remains in place, unchanged. Cardiomegaly with vascular congestion and mild pulmonary edema. Small layering effusions. IMPRESSION: Mild CHF with small layering effusions. Findings similar to prior study. Electronically Signed   By: Rolm Baptise M.D.   On: 08/13/2017 09:15   Dg Chest Port 1 View  Result Date: 08/12/2017 CLINICAL DATA:  Central line placement EXAM: PORTABLE CHEST 1 VIEW COMPARISON:  08/12/2017 FINDINGS: Left upper extremity catheter tip overlies the SVC. Larger poor left central venous catheter tip poorly visible, appears to overlie the left brachiocephalic region. No pneumothorax is seen. Post sternotomy changes and cutaneous staples over the right chest. Surgical clips in the right axilla. Worsened bibasilar opacification. Cardiomegaly with aortic atherosclerosis. IMPRESSION: 1. Left-sided central venous catheter tip poorly visible, appears to project over the left brachiocephalic region. No pneumothorax. 2. Cardiomegaly with increased vascular congestion and background edema 3. Worsening airspace disease within the bilateral lung bases. Electronically Signed   By: Donavan Foil M.D.   On: 08/12/2017 19:07   Dg Chest Port 1  View  Result Date: 08/12/2017 CLINICAL DATA:  CABG, chest soreness EXAM: PORTABLE CHEST 1 VIEW COMPARISON:  08/11/2017 FINDINGS: Prior CABG. Left PICC line is unchanged. Cardiomegaly with vascular congestion and mild pulmonary edema. Small bilateral effusions suspected. No pneumothorax. IMPRESSION: Continued CHF pattern.  Small bilateral effusions. Electronically Signed   By: Rolm Baptise M.D.   On: 08/12/2017 08:45   Dg Chest Port 1 View  Result Date: 08/11/2017 CLINICAL DATA:  Chest pain following coronary bypass  grafting EXAM: PORTABLE CHEST 1 VIEW COMPARISON:  08/10/2017 FINDINGS: Cardiac shadow remains enlarged. Left-sided PICC line is again seen. Right jugular sheath has been removed. Postoperative changes are again noted. Mild vascular congestion and interstitial edema is seen stable from the prior exam. Likely small pleural effusions remain. No new focal infiltrate is seen. IMPRESSION: Changes of mild CHF. Electronically Signed   By: Inez Catalina M.D.   On: 08/11/2017 07:48   Dg Chest Port 1 View  Result Date: 08/10/2017 CLINICAL DATA:  Left PICC catheter placement. EXAM: PORTABLE CHEST 1 VIEW COMPARISON:  Chest x-ray from same date. FINDINGS: Normal insertion of a left-sided PICC line with the tip projecting near the cavoatrial junction. Unchanged right internal jugular sheath. Stable cardiomegaly with pulmonary vascular congestion and pulmonary edema. Small bilateral pleural effusions are unchanged. No pneumothorax. No acute osseous abnormality. IMPRESSION: 1. Interval placement of a left-sided PICC line with the tip projecting over the cavoatrial junction. 2. Unchanged cardiomegaly with pulmonary edema and small bilateral pleural effusions. Electronically Signed   By: Titus Dubin M.D.   On: 08/10/2017 13:34   Dg Chest Port 1 View  Result Date: 08/10/2017 CLINICAL DATA:  Status post coronary artery bypass graft. EXAM: PORTABLE CHEST 1 VIEW COMPARISON:  Radiograph of August 09, 2017. FINDINGS: Stable cardiomegaly. Sternotomy wires are noted. Atherosclerosis of thoracic aorta is noted. No pneumothorax is noted. Left-sided chest tube has been removed. Bilateral perihilar and basilar opacities are noted concerning for pulmonary edema with mild associated pleural effusions. Bony thorax is unremarkable. IMPRESSION: Left-sided chest tube has been removed without evidence of pneumothorax. Aortic atherosclerosis. Bilateral pulmonary edema is noted with mild bilateral pleural effusions. Electronically Signed    By: Marijo Conception, M.D.   On: 08/10/2017 07:37   Dg Abd Portable 1v  Result Date: 08/15/2017 CLINICAL DATA:  Follow-up ileus EXAM: PORTABLE ABDOMEN - 1 VIEW COMPARISON:  08/14/2017 FINDINGS: Scattered large and small bowel gas is noted. No obstructive changes are seen. Contrast material is noted throughout the colon stable from the prior exam. Diverticulosis without findings of diverticulitis is noted. No free air is seen. No obstructive changes are noted. IMPRESSION: No acute abnormality noted. No findings to suggest obstructive change are seen. Electronically Signed   By: Inez Catalina M.D.   On: 08/15/2017 08:35   Dg Abd Portable 1v  Result Date: 08/14/2017 CLINICAL DATA:  Ileus EXAM: PORTABLE ABDOMEN - 1 VIEW COMPARISON:  August 13, 2017 FINDINGS: There is contrast in the colon. There are multiple contrast filled colonic diverticulum. There is no bowel dilatation or air-fluid level to suggest bowel obstruction. No free air. Consolidation noted in left lung base. Ovary pacemaker wires are attached to the right heart. IMPRESSION: No bowel dilatation or air-fluid level to suggest ileus or bowel obstruction. No free air. Multiple colonic filled diverticulum. Persistent left lower lobe consolidation. Right lung base appears clear. Pacemaker wires attached to right heart. Electronically Signed   By: Lowella Grip III M.D.   On: 08/14/2017 08:02  Dg Abd Portable 1v  Result Date: 08/13/2017 CLINICAL DATA:  Ileus EXAM: PORTABLE ABDOMEN - 1 VIEW COMPARISON:  08/11/2017 FINDINGS: Contrast noted throughout nondistended transverse colon and left colon. Continued gaseous distention of the cecum and ascending colon, stable. Contrast noted within sigmoid diverticula. No small bowel distention. No free air. IMPRESSION: Continued moderate gaseous distention of the right colon. Remainder the colon is decompressed with contrast throughout. Filling of sigmoid diverticula. Electronically Signed   By: Rolm Baptise  M.D.   On: 08/13/2017 09:14   Dg Abd Portable 1v  Result Date: 08/11/2017 CLINICAL DATA:  Evaluate for ileus EXAM: PORTABLE ABDOMEN - 1 VIEW COMPARISON:  None. FINDINGS: Contrast is seen in the colon. The transverse and descending colon are decompressed. The ascending colon is mildly prominent. There is a paucity of small bowel gas but no definitive dilatation. Linear densities projected over the right side of the abdomen may be on or in patient. Recommend clinical correlation. No other acute abnormalities. IMPRESSION: 1. The ascending colon is mildly prominent with decompression of the transverse and descending colon. This is a nonspecific bowel gas pattern. 2. Linear densities projected over the right side of the abdomen may be on or in the patient. Recommend clinical correlation. Electronically Signed   By: Dorise Bullion III M.D   On: 08/11/2017 18:12   Dg Swallowing Func-speech Pathology  Result Date: 08/27/2017 Objective Swallowing Evaluation: Type of Study: MBS-Modified Barium Swallow Study Patient Details Name: Erdem Naas MRN: [id number] Date of Birth: 1932-07-25 Today's Date: 08/27/2017 Time: SLP Start Time (ACUTE ONLY): 1120-SLP Stop Time (ACUTE ONLY): 1140 SLP Time Calculation (min) (ACUTE ONLY): 20 min Past Medical History: Past Medical History: Diagnosis Date . BPH (benign prostatic hypertrophy)  . 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 Chronic kidney disease stage 3  . 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 GERD (gastroesophageal reflux disease)  . Gout  . Hyperlipidemia  . Hypertensive heart disease  . Incontinence of urine  . Memory loss  . Myocardial infarction ([place]) 1994 . Osteoarthritis  . Paroxysmal atrial fibrillation ([facility]CC)  . Peripheral vascular disease ([place])  . Personal history of transient ischemic attack (TIA) and cerebral infarction without residual deficit  . Right pontine stroke ([place]) 02/27/2015 . Tooth disease  Past Surgical  History: Past Surgical History: Procedure Laterality Date . CARDIAC CATHETERIZATION  07/21/2005 . CORONARY ARTERY BYPASS GRAFT N/A 08/07/2017  Procedure: CORONARY ARTERY BYPASS GRAFTING (CABG) x 2 WITH ENDOSCOPIC HARVESTING OF RIGHT SAPHENOUS VEIN.;  Surgeon: Ivin Poot, MD;  Location: [facility];  Service: Open Heart Surgery;  Laterality: N/A; . HERNIA REPAIR  12/26/2002  BILATERAL INGUINAL HERNIA REPAIR . LEFT HEART CATH AND CORONARY ANGIOGRAPHY N/A 08/03/2017  Procedure: LEFT HEART CATH AND CORONARY ANGIOGRAPHY;  Surgeon: Sherren Mocha, MD;  Location: [facility];  Service: Cardiovascular;  Laterality: N/A; . MASTOID SURGERY X 5   . PROSTATE SURGERY   . TEE WITHOUT CARDIOVERSION N/A 08/07/2017  Procedure: TRANSESOPHAGEAL ECHOCARDIOGRAM (TEE);  Surgeon: Prescott Gum, Collier Salina, MD;  Location: [place];  Service: Open Heart Surgery;  Laterality: N/A; . TOTAL KNEE ARTHROPLASTY Left 03/28/2013  Procedure: LEFT TOTAL KNEE ARTHROPLASTY;  Surgeon: Gearlean Alf, MD;  Location: [facility];  Service: Orthopedics;  Laterality: Left; HPI: Pt is an 82 yo male admitted with NSTEMI s/p cath which showed severe left main disease and total right coronary artery stenosis, potential CABG planned for 9/14. PMhx: CVA, sensorineural  hearing loss, HLD, PVD, pAF, CKD stage 3, HTN, NSTEMI, chronic diastolic heart failure, and dysphagia from pontine CVA. Was evaluated by ST this admission prior to CABG; MBS 08/05/17 revealed mild oropharyngeal and cervical esophageal dysphagia, suspected to be more chronic in nature given his h/o dysphagia from prior CVA and h/o GERD. Dys 3, thin liquids, no straws recommended with ST to follow briefly through surgery given his history. Pt was intubated for CABG on 9/14, transported back to floor on vent and subsequent extubated at 10pm same day. Pt was consuming Dys 2, nectar > developed ileus > NPO and diagnostic reassessment this morning.   Subjective: RN reports pt coughing with PO s/p CABG Assessment / Plan /  Recommendation [facility] IP CLINICAL IMPRESSIONS 08/27/2017 Clinical Impression Patient with a mild motor based oropharyngeal dysphagia. Patient with oral holding of bolus at the base of tongue in preparation for swallow, at times in excess of 5 seconds, however swallow initiated either at or above the level of the vallecula with all solid and nectar thick liquid consistencies. With thin liquids however, swallow is delayed to the level of the pyriform sinuses which when combined with decreased laryngeal closure, results in deep silent penetration to the vocal cords. although occurring in only 25-30% of trials, cued cough weak and unsuccessful to clear hte airway resulting in a significant risk of post swallow aspiration. Given decreased functional reserve with acute illness, recommend dysphagia 3 solids with nectar thick liquids. Prognosis for ability to advance diet good with improved medical status and conditioning. Will f/u.  SLP Visit Diagnosis Dysphagia, unspecified (R13.10) Attention and concentration deficit following -- Frontal lobe and executive function deficit following -- Impact on safety and function Moderate aspiration risk   [facility] IP TREATMENT RECOMMENDATION 08/27/2017 Treatment Recommendations Therapy as outlined in treatment plan below   Prognosis 08/27/2017 Prognosis for Safe Diet Advancement Good Barriers to Reach Goals -- Barriers/Prognosis Comment -- [facility] IP DIET RECOMMENDATION 08/27/2017 SLP Diet Recommendations Dysphagia 3 (Mech soft) solids;Nectar thick liquid Liquid Administration via Cup;No straw Medication Administration Whole meds with puree Compensations Slow rate;Small sips/bites Postural Changes Seated upright at 90 degrees   [facility] IP OTHER RECOMMENDATIONS 08/27/2017 Recommended Consults -- Oral Care Recommendations Oral care BID Other Recommendations Order thickener from pharmacy;Prohibited food (jello, ice cream, thin soups);Remove water pitcher   [facility] IP FOLLOW UP RECOMMENDATIONS 08/27/2017 Follow up  Recommendations Skilled Nursing facility   [facility] IP FREQUENCY AND DURATION 08/27/2017 Speech Therapy Frequency (ACUTE ONLY) min 2x/week Treatment Duration 2 weeks      [facility] IP ORAL PHASE 08/27/2017 Oral Phase Impaired Oral - Pudding Teaspoon -- Oral - Pudding Cup -- Oral - Honey Teaspoon -- Oral - Honey Cup -- Oral - Nectar Teaspoon Delayed oral transit;Lingual pumping;Holding of bolus Oral - Nectar Cup Delayed oral transit;Lingual pumping;Holding of bolus Oral - Nectar Straw -- Oral - Thin Teaspoon -- Oral - Thin Cup Delayed oral transit;Lingual pumping;Holding of bolus Oral - Thin Straw Delayed oral transit;Lingual pumping;Holding of bolus Oral - Puree Delayed oral transit;Lingual pumping;Holding of bolus Oral - Mech Soft Delayed oral transit;Lingual pumping;Holding of bolus Oral - Regular -- Oral - Multi-Consistency -- Oral - Pill NT Oral Phase - Comment --  [facility] IP PHARYNGEAL PHASE 08/27/2017 Pharyngeal Phase Impaired Pharyngeal- Pudding Teaspoon -- Pharyngeal -- Pharyngeal- Pudding Cup -- Pharyngeal -- Pharyngeal- Honey Teaspoon -- Pharyngeal -- Pharyngeal- Honey Cup -- Pharyngeal -- Pharyngeal- Nectar Teaspoon Delayed swallow initiation-vallecula;Reduced tongue base retraction;Pharyngeal residue - valleculae Pharyngeal -- Pharyngeal- Nectar Cup Delayed  swallow initiation-vallecula;Reduced tongue base retraction;Pharyngeal residue - valleculae Pharyngeal -- Pharyngeal- Nectar Straw -- Pharyngeal -- Pharyngeal- Thin Teaspoon Delayed swallow initiation-pyriform sinuses;Reduced tongue base retraction;Pharyngeal residue - valleculae;Reduced anterior laryngeal mobility Pharyngeal -- Pharyngeal- Thin Cup Delayed swallow initiation-pyriform sinuses;Penetration/Aspiration during swallow;Reduced tongue base retraction;Pharyngeal residue - valleculae;Reduced anterior laryngeal mobility Pharyngeal Material enters airway, CONTACTS cords and not ejected out Pharyngeal- Thin Straw Delayed swallow initiation-pyriform sinuses;Reduced  tongue base retraction;Reduced anterior laryngeal mobility;Penetration/Aspiration during swallow;Pharyngeal residue - valleculae Pharyngeal Material enters airway, CONTACTS cords and not ejected out Pharyngeal- Puree Delayed swallow initiation-vallecula;Reduced tongue base retraction;Pharyngeal residue - valleculae Pharyngeal -- Pharyngeal- Mechanical Soft Delayed swallow initiation-vallecula;Reduced tongue base retraction;Pharyngeal residue - valleculae Pharyngeal -- Pharyngeal- Regular -- Pharyngeal -- Pharyngeal- Multi-consistency -- Pharyngeal -- Pharyngeal- Pill NT Pharyngeal -- Pharyngeal Comment --  [facility] IP CERVICAL ESOPHAGEAL PHASE 08/27/2017 Cervical Esophageal Phase WFL Pudding Teaspoon -- Pudding Cup -- Honey Teaspoon -- Honey Cup -- Nectar Teaspoon -- Nectar Cup -- Nectar Straw -- Thin Teaspoon -- Thin Cup -- Thin Straw -- Puree -- Mechanical Soft -- Regular -- Multi-consistency -- Pill -- Cervical Esophageal Comment -- [facility] IP GO 03/28/2015 Functional Assessment Tool Used MBSS Functional Limitations Swallowing Swallow Current Status (M3536) CI Swallow Goal Status (R4431) CI Swallow Discharge Status (V4008) CI Motor Speech Current Status (Q7619) (None) Motor Speech Goal Status (J0932) (None) Motor Speech Goal Status (I7124) (None) Spoken Language Comprehension Current Status (P8099) (None) Spoken Language Comprehension Goal Status (I3382) (None) Spoken Language Comprehension Discharge Status (N0539) (None) Spoken Language Expression Current Status (J6734) (None) Spoken Language Expression Goal Status (L9379) (None) Spoken Language Expression Discharge Status (K2409) (None) Attention Current Status (B3532) (None) Attention Goal Status (D9242) (None) Attention Discharge Status (A8341) (None) Memory Current Status (D6222) (None) Memory Goal Status (L7989) (None) Memory Discharge Status (Q1194) (None) Voice Current Status (R7408) (None) Voice Goal Status (X4481) (None) Voice Discharge Status (E5631) (None) Other  Speech-Language Pathology Functional Limitation Current Status (S9702) (None) Other Speech-Language Pathology Functional Limitation Goal Status (O3785) (None) Other Speech-Language Pathology Functional Limitation Discharge Status [phone]) (None) Gabriel Rainwater MA, CCC-SLP [phone] McCoy Leah Meryl 08/27/2017, 1:55 PM              US Abdomen Limited Ruq  Result Date: 08/12/2017 CLINICAL DATA:  Vomiting. EXAM: ULTRASOUND ABDOMEN LIMITED RIGHT UPPER QUADRANT COMPARISON:  CT scan of July 07, 2013. FINDINGS: Gallbladder: No gallstones or wall thickening visualized. No sonographic Murphy sign noted by sonographer. Sludge is noted in the gallbladder. Common bile duct: Diameter: 3 mm which is within normal limits. Liver: No focal lesion identified, although left hepatic lobe is not visualized due to overlying bandage. Within normal limits in parenchymal echogenicity. Portal vein is patent on color Doppler imaging with normal direction of blood flow towards the liver. IMPRESSION: Exam is somewhat limited due to overlying bandage. Sludge is noted in gallbladder lumen. No other definite abnormality seen in the right upper quadrant of the abdomen. Electronically Signed   By: Marijo Conception, M.D.   On: 08/12/2017 09:45    Assessment/Plan Persistent Nausea and Vomiting. Stat labs were done. He has Mild Leucocytosis again. He is on Augmentin for Aspiration Pneumonia. His Sodium and Bun and Creat looks better. We will get Xray of Abdomen to rule out Obstruction though his Exam is Benign. He also got Enema with some relief Started him On linzess but has not gotten first dose yet Will also increase his Prilosec to BID. Send to ED if Patient does not improve.  Left Lower  Lobe Pneumonia  Patient is on Augmentin. He is afebrile  S/P CABG Patient is working with therapy. Per therapy he is not doing well. Has not been Participating much. Follow with [facility]  Acute on Chronic renal failure Patient BUN is  improving. Follow up with Nephrology. He has told Nephrology many times now that he is not going to do dialysis.  Vaso cath has been removed. His weight is decreased due to dehydration.  Atrial fibrillation His Metoprolol was on Hold and was restart yesterday Also on Diltiazem and Amiodarone. Also on Coumadin which is on hold due to mildly elevated INR.  INR is 2.4 Today. Restart Coumadin from Tomorrow  Hypernatremia  Has improved  CAD with Hyperlipidemia Is on Statin and aspirin  Anemia Post op and Chronic renal disease. Got Aranesp 60 Next due 10/9  Constipation Will treat it aggressively  Started on Linzess  I have d/w Daughter and Therapy. Patient has told therapy that he is tired and does not want anything aggressive but at this time Daughter wants everything done for him and he stays Full Code.  Family/ staff Communication:   Labs/tests ordered:   CBC, CMP, Lipase, INR tomorrow Total time spent in this patient care encounter was 45_ minutes; greater than 50% of the visit spent counseling patient, reviewing records , Labs and coordinating care for problems addressed at this encounter.

## 2017-09-09 ENCOUNTER — Encounter (HOSPITAL_COMMUNITY)
Admission: RE | Admit: 2017-09-09 | Discharge: 2017-09-09 | Disposition: A | Discharge status: Home / Self Care | Payer: Medicare Other | Source: Skilled Nursing Facility | Attending: Internal Medicine | Admitting: Internal Medicine

## 2017-09-09 ENCOUNTER — Encounter: Payer: Self-pay | Admitting: Internal Medicine

## 2017-09-09 ENCOUNTER — Non-Acute Institutional Stay (SKILLED_NURSING_FACILITY): Payer: Medicare Other | Admitting: Internal Medicine

## 2017-09-09 DIAGNOSIS — N183 Chronic kidney disease, stage 3 unspecified: Secondary | ICD-10-CM

## 2017-09-09 DIAGNOSIS — Z48812 Encounter for surgical aftercare following surgery on the circulatory system: Secondary | ICD-10-CM | POA: Insufficient documentation

## 2017-09-09 DIAGNOSIS — J690 Pneumonitis due to inhalation of food and vomit: Secondary | ICD-10-CM

## 2017-09-09 DIAGNOSIS — R1110 Vomiting, unspecified: Secondary | ICD-10-CM

## 2017-09-09 DIAGNOSIS — D631 Anemia in chronic kidney disease: Secondary | ICD-10-CM | POA: Insufficient documentation

## 2017-09-09 DIAGNOSIS — I10 Essential (primary) hypertension: Secondary | ICD-10-CM | POA: Insufficient documentation

## 2017-09-09 DIAGNOSIS — I480 Paroxysmal atrial fibrillation: Secondary | ICD-10-CM

## 2017-09-09 DIAGNOSIS — E785 Hyperlipidemia, unspecified: Secondary | ICD-10-CM | POA: Insufficient documentation

## 2017-09-09 DIAGNOSIS — Z951 Presence of aortocoronary bypass graft: Secondary | ICD-10-CM

## 2017-09-09 DIAGNOSIS — K5900 Constipation, unspecified: Secondary | ICD-10-CM

## 2017-09-09 DIAGNOSIS — Z7901 Long term (current) use of anticoagulants: Secondary | ICD-10-CM | POA: Insufficient documentation

## 2017-09-09 LAB — CBC WITH DIFFERENTIAL/PLATELET
BASOS ABS: 0 10*3/uL (ref 0.0–0.1)
BASOS PCT: 0 %
EOS ABS: 0.1 10*3/uL (ref 0.0–0.7)
EOS PCT: 2 %
HCT: 33.8 % — ABNORMAL LOW (ref 39.0–52.0)
Hemoglobin: 10.5 g/dL — ABNORMAL LOW (ref 13.0–17.0)
Lymphocytes Relative: 14 %
Lymphs Abs: 1.1 10*3/uL (ref 0.7–4.0)
MCH: 34.2 pg — ABNORMAL HIGH (ref 26.0–34.0)
MCHC: 31.1 g/dL (ref 30.0–36.0)
MCV: 110.1 fL — ABNORMAL HIGH (ref 78.0–100.0)
MONO ABS: 0.4 10*3/uL (ref 0.1–1.0)
Monocytes Relative: 5 %
Neutro Abs: 6.3 10*3/uL (ref 1.7–7.7)
Neutrophils Relative %: 79 %
PLATELETS: 119 10*3/uL — AB (ref 150–400)
RBC: 3.07 MIL/uL — AB (ref 4.22–5.81)
RDW: 17.4 % — AB (ref 11.5–15.5)
WBC: 8 10*3/uL (ref 4.0–10.5)

## 2017-09-09 LAB — COMPREHENSIVE METABOLIC PANEL
ALT: 16 U/L — AB (ref 17–63)
ANION GAP: 9 (ref 5–15)
AST: 15 U/L (ref 15–41)
Albumin: 2.2 g/dL — ABNORMAL LOW (ref 3.5–5.0)
Alkaline Phosphatase: 81 U/L (ref 38–126)
BUN: 54 mg/dL — ABNORMAL HIGH (ref 6–20)
CHLORIDE: 109 mmol/L (ref 101–111)
CO2: 27 mmol/L (ref 22–32)
CREATININE: 3.41 mg/dL — AB (ref 0.61–1.24)
Calcium: 9 mg/dL (ref 8.9–10.3)
GFR, EST AFRICAN AMERICAN: 18 mL/min — AB (ref >60)
GFR, EST NON AFRICAN AMERICAN: 15 mL/min — AB (ref >60)
Glucose, Bld: 108 mg/dL — ABNORMAL HIGH (ref 65–99)
Potassium: 4.2 mmol/L (ref 3.5–5.1)
SODIUM: 145 mmol/L (ref 135–145)
Total Bilirubin: 0.6 mg/dL (ref 0.3–1.2)
Total Protein: 5.7 g/dL — ABNORMAL LOW (ref 6.5–8.1)

## 2017-09-09 LAB — VITAMIN B12: VITAMIN B 12: 325 pg/mL (ref 180–914)

## 2017-09-09 LAB — PROTIME-INR
INR: 2.22
PROTHROMBIN TIME: 24.4 s — AB (ref 11.4–15.2)

## 2017-09-09 LAB — LIPASE, BLOOD: LIPASE: 26 U/L (ref 11–51)

## 2017-09-09 LAB — FOLATE: FOLATE: 9.5 ng/mL (ref >5.9)

## 2017-09-09 NOTE — Progress Notes (Signed)
Location:   [facility] Room Number: 134/P Place of Service:  SNF [phone]) Provider:  Imogene Burn, MD  Patient Care Team: Glenda Chroman, MD as PCP - General (Internal Medicine) Jacolyn Reedy, MD as Consulting Physician (Cardiology)  Extended Emergency Contact Information Primary Emergency Contact: [name], [place] [address] [name] of [place] Phone: [phone] Mobile Phone: [phone] Relation: Daughter Secondary Emergency Contact: [name] Address: [address] [place], [place] [address] [employer] [place] [employer] Phone: [phone] Relation: Daughter  Code Status:  Full Code Goals of care: Advanced Directive information Advanced Directives 09/09/2017  Does Patient Have a Medical Advance Directive? Yes  Type of Advance Directive (No Data)  Does patient want to make changes to medical advance directive? No - Patient declined  Copy of [place] in Chart? -  Would patient like information on creating a medical advance directive? No - Patient declined  Pre-existing out of facility DNR order (yellow form or pink MOST form) -    Chief complaint-acute visit follow-up vomiting-hypernatremia-pneumonia   HPI:  Pt is a 83 y.o. male seen today for an acute visit for for follow-up of continuous vomiting as well as history of pneumonia and hypernatremia.  He initially was admitted to skilled nursing for therapy after undergoing a CABG 2 in mid September with right leg saphenous vein and also right axillary cannulation.   complicated by acute renal failure that required dialysis for short time as well as aspiration pneumonia and rapid A. fib.  Patient previously was on chronic  Eliquis with a history of atrial fibrillation as well as a CVA 2016.  He is currently on Coumadin which is being held secondary to supratherapeutic levels however this has normalized as of yesterday and INR  today is 2.22 will restart Coumadin.  He recently was started on Augmentin for recurrent aspiration pneumonia he is on a modified diet because of his dysphagia.  Patient has had vomiting episodes and had one in therapy yesterday as well as one in the afternoon yesterday.  He complained of lower abdominal discomfort said he felt weak and tired.  Appetite was poor.  Abdominal x-ray has returned that does not show any acute process it does show mild constipation Dr. Lyndel Safe did start him on  Linzess yesterday-he is also on Colace.  Was also noted yesterday his white count was mildly elevated-.  Updated labs were obtained today shows white count has normalized at 8.0 hemoglobin is stable at 10.5.  He also has hypernatremia thought secondary to possible poor by mouth intake but this actually has improved as well it was 150 late last week it is now 145 on lab done today area  He does have significant renal issues creatinine is relatively baseline at 3.41 with BUN of 54.  Currently he says he is feeling a bit better today apparently ate 75% of his breakfast which is encouraging.  He appears to be a bit more energetic today although again he is quite frail.     Past Medical History:  Diagnosis Date  . BPH (benign prostatic hypertrophy)   . Cancer ([facility])    CANCEROUS MOLE REMOVED FROM BACK - SEVERAL YRS AGO  . Chronic kidney disease stage 3   . Coronary artery disease    DR. TILLEY IS PT'S CARDIOLGIST  . GERD (gastroesophageal reflux disease)   .  Gout   . Hyperlipidemia   . Hypertensive heart disease   . Incontinence of urine   . Memory loss   . Myocardial infarction ([facility]) 1994  . Osteoarthritis   . Paroxysmal atrial fibrillation (HCC)   . Peripheral vascular disease ([facility])   . Personal history of transient ischemic attack (TIA) and cerebral infarction without residual deficit   . Right pontine stroke ([facility]) 02/27/2015  . Tooth disease    Past Surgical History:  Procedure Laterality  Date  . CARDIAC CATHETERIZATION  07/21/2005  . CORONARY ARTERY BYPASS GRAFT N/A 08/07/2017   Procedure: CORONARY ARTERY BYPASS GRAFTING (CABG) x 2 WITH ENDOSCOPIC HARVESTING OF RIGHT SAPHENOUS VEIN.;  Surgeon: Ivin Poot, MD;  Location: [facility];  Service: Open Heart Surgery;  Laterality: N/A;  . HERNIA REPAIR  12/26/2002   BILATERAL INGUINAL HERNIA REPAIR  . LEFT HEART CATH AND CORONARY ANGIOGRAPHY N/A 08/03/2017   Procedure: LEFT HEART CATH AND CORONARY ANGIOGRAPHY;  Surgeon: Sherren Mocha, MD;  Location: [facility];  Service: Cardiovascular;  Laterality: N/A;  . MASTOID SURGERY X 5    . PROSTATE SURGERY    . TEE WITHOUT CARDIOVERSION N/A 08/07/2017   Procedure: TRANSESOPHAGEAL ECHOCARDIOGRAM (TEE);  Surgeon: Prescott Gum, Collier Salina, MD;  Location: [facility];  Service: Open Heart Surgery;  Laterality: N/A;  . TOTAL KNEE ARTHROPLASTY Left 03/28/2013   Procedure: LEFT TOTAL KNEE ARTHROPLASTY;  Surgeon: Gearlean Alf, MD;  Location: [facility];  Service: Orthopedics;  Laterality: Left;    Allergies  Allergen Reactions  . Lipitor [Atorvastatin] Other (See Comments)    Severe constipation    Outpatient Encounter Prescriptions as of 09/09/2017  Medication Sig  . acetaminophen (TYLENOL) 500 MG tablet Take 1,000 mg by mouth every 8 (eight) hours as needed.  Marland Kitchen allopurinol (ZYLOPRIM) 300 MG tablet Take 0.5 tablets (150 mg total) by mouth daily.  Marland Kitchen amiodarone (PACERONE) 200 MG tablet Take 1 tablet (200 mg total) by mouth daily.  Marland Kitchen amoxicillin-clavulanate (AUGMENTIN) 500-125 MG tablet Take 1 tablet by mouth 2 (two) times daily.  Marland Kitchen aspirin EC 81 MG EC tablet Take 1 tablet (81 mg total) by mouth daily.  . Cholecalciferol (VITAMIN D3) 2000 units TABS Take 2,000 Units by mouth daily.  Marland Kitchen docusate sodium (COLACE) 100 MG capsule Take 100 mg by mouth daily.  . feeding supplement, ENSURE ENLIVE, (ENSURE ENLIVE) LIQD Take 237 mLs by mouth 2 (two) times daily between meals.  . finasteride (PROSCAR) 5 MG tablet  Take 5 mg by mouth daily.  Marland Kitchen HYDROcodone-acetaminophen (NORCO/VICODIN) 5-325 MG tablet Take 1 tablet by mouth every 6 (six) hours as needed for moderate pain.  Marland Kitchen linaclotide (LINZESS) 72 MCG capsule Take 72 mcg by mouth daily before breakfast.  . metoprolol tartrate (LOPRESSOR) 25 MG tablet Take 12.5 mg by mouth 2 (two) times daily.  Marland Kitchen omeprazole (PRILOSEC) 20 MG capsule Take 20 mg by mouth daily.  . ondansetron (ZOFRAN) 4 MG tablet Take 4 mg by mouth every 8 (eight) hours as needed for nausea or vomiting.  . ondansetron (ZOFRAN) 4 MG tablet Take 4 mg by mouth every 6 (six) hours as needed for nausea or vomiting.  . polyethylene glycol (MIRALAX / GLYCOLAX) packet Take 17 g by mouth daily.  . rosuvastatin (CRESTOR) 10 MG tablet Take 1 tablet (10 mg total) by mouth daily.   No facility-administered encounter medications on file as of 09/09/2017.     Review of Systems  In general is not complaining of fever chills  says he feels a bit better today continues to have somewhat of a poor appetite but apparently ate 75% of his breakfast per therapy.  Still complains of being weak.  Skin does not complain of rashes or itching surgical sites appear to be healing unremarkably.  Head ears eyes nose mouth and throat does not complain of visual changes or sore throat.  Respiratory does have an occasional cough is being treated for pneumonia is not really complaining of shortness of breath today.  Cardiac does not complaining of chest pain does not really have significant lower extremity edema.  GI again has had nausea and vomiting but apparently this has improved today also constipation as noted above he has been started on additional medication.  GU does not complain of dysuria.  Muscle skeletal does not really complain of joint pain does complain of weakness.  Neurologic does not complain of headache dizziness or syncope.  Psych appears to be in somewhat better spirits today has had somewhat of  a diysphoric mood but appears to be improved somewhat today.  He is pleasant smiling and joking at times    There is no immunization history on file for this patient. Pertinent  Health Maintenance Due  Topic Date Due  . INFLUENZA VACCINE  10/24/2017 (Originally 06/24/2017)  . PNA vac Low Risk Adult (1 of 2 - PCV13) 10/24/2017 (Originally 10/20/1999)   Fall Risk  10/27/2016 10/30/2015  Falls in the past year? No No   Functional Status Survey:    Vitals:   09/09/17 1138  BP: (!) 97/50  Pulse: 92  Resp: 20  Temp: (!) 97.3 F (36.3 C)  TempSrc: Oral  SpO2: 92%  manual blood pressure was 120/48 Weight is 156 pounds Physical Exam In general this is a frail elderly male in no distress sitting comfortably in his wheelchair appears to be feeling a bit better than when I saw him last week he is talking a bit more joking sterile frail.  Skin is warm and dry does have continued chronic bruising of his upper extremities and a well-healed midline surgical scar as well as a small axillary scar on the right.  Oropharynx is clear mucous membranes moist.  Eyes visual acuity appears grossly intact.  Chest is clear to auscultation with shallow air entry there is no labored breathing.  Heart is irregular irregular rate and rhythm in the 80s he does not really have significant lower extremity edema.  Abdomen is soft nontender there are positive bowel sounds.  Muscle skeletal continues with general frailty is able to move all extremities 4 to appears baseline with previous exam.  Neurologic he is alert at could not really see any lateralizing findings her speech is clear.  Psych he is alert and oriented pleasant appropriate appears to be in somewhat better spirits than when I saw him last week Labs reviewed:  Recent Labs  08/17/17 0501  08/18/17 0355  08/19/17 0332  08/26/17 0504 08/27/17 0452 08/28/17 1620  09/07/17 0945 09/08/17 1045 09/09/17 0712  NA 133*  < > 131*  < > 130*  <  > 138 142 146*  < > 146* 145 145  K 3.9  < > 3.3*  < > 3.5  < > 3.6 3.7 4.2  < > 4.1 4.2 4.2  CL 101  < > 97*  < > 96*  < > 101 106 115*  < > 110 109 109  CO2 22  < > 21*  --  20*  < >  22 22 23   < > 28 25 27   GLUCOSE 126*  < > 117*  < > 103*  < > 107* 103* 113*  < > 122* 141* 108*  BUN 22*  < > 46*  < > 65*  < > 100* 100* 84*  < > 49* 55* 54*  CREATININE 1.42*  < > 2.73*  < > 3.77*  < > 5.17* 4.79* 3.90*  < > 3.19* 3.68* 3.41*  CALCIUM 7.5*  < > 8.0*  --  8.3*  < > 9.5 9.2 9.2  < > 9.3 9.5 9.0  MG 1.8  --  1.9  --  1.9  --   --   --   --   --   --   --   --   PHOS 2.0*  < > 5.4*  --  5.6*  < > 5.3* 4.8* 4.1  --   --   --   --   < > = values in this interval not displayed.  Recent Labs  09/06/17 0719 09/08/17 1045 09/09/17 0712  AST 16 19 15   ALT 16* 16* 16*  ALKPHOS 84 91 81  BILITOT 0.5 0.6 0.6  PROT 5.1* 5.8* 5.7*  ALBUMIN 2.2* 2.4* 2.2*    Recent Labs  09/06/17 0719 09/08/17 1045 09/09/17 0712  WBC 5.9 11.7* 8.0  NEUTROABS 4.5 10.1* 6.3  HGB 10.3* 11.1* 10.5*  HCT 33.4* 35.8* 33.8*  MCV 112.5* 110.8* 110.1*  PLT 112* 138* 119*   Lab Results  Component Value Date   TSH 3.035 08/04/2017   Lab Results  Component Value Date   HGBA1C 5.3 08/04/2017   Lab Results  Component Value Date   CHOL 162 02/28/2015   HDL 31 (L) 02/28/2015   LDLCALC 102 (H) 02/28/2015   TRIG 104 08/17/2017   CHOLHDL 5.2 02/28/2015    Significant Diagnostic Results in last 30 days:  Dg Chest 2 View  Result Date: 08/26/2017 CLINICAL DATA:  Status post CABG on August 07, 2017 EXAM: CHEST  2 VIEW COMPARISON:  Portable chest x-ray of August 21, 2017. FINDINGS: The lungs are adequately inflated. There is persistent increased density in the retrocardiac region. There is no pneumothorax or significant pleural effusion. The cardiac silhouette is top-normal in size. The pulmonary vascularity is not engorged. The sternal wires are intact. The left PICC line tip projects over the midportion of  the SVC. The bony thorax exhibits no acute abnormality. IMPRESSION: Persistent left lower lobe atelectasis or pneumonia. No significant pulmonary edema or pleural effusion. Electronically Signed   By: David  Martinique M.D.   On: 08/26/2017 07:45   US Renal  Result Date: 08/12/2017 CLINICAL DATA:  Acute kidney injury.  Open heart surgery 08/07/2017 EXAM: RENAL / URINARY TRACT ULTRASOUND COMPLETE COMPARISON:  CT abdomen and pelvis 07/07/2013 FINDINGS: Right Kidney: Length: 10.2 cm. Diffuse renal parenchymal thinning with increased parenchymal echotexture consistent with chronic medical renal disease. No solid mass, stone, or hydronephrosis identified. Left Kidney: Length: 8.5 cm. Diffuse renal parenchymal thinning with increased parenchymal echotexture consistent with chronic medical renal disease. Multiple cysts are demonstrated, largest measuring 8.2 cm in maximal diameter. No solid masses seen. No hydronephrosis. Visualization of the left kidney is limited due to poor penetration. Bladder: Bladder is decompressed with a Foley catheter and is not evaluated. IMPRESSION: Bilateral renal parenchymal atrophy and increased parenchymal echotexture consistent with chronic medical renal disease. No hydronephrosis. Benign-appearing left renal cysts. Electronically Signed   By: Oren Beckmann.D.  On: 08/12/2017 22:35   Dg Chest Port 1 View  Result Date: 08/21/2017 CLINICAL DATA:  CABG. EXAM: PORTABLE CHEST 1 VIEW COMPARISON:  08/20/2017 . FINDINGS: Dual lumen left subclavian central line in stable position. Prior CABG. Cardiomegaly with normal pulmonary vascularity. Bibasilar atelectasis. Improved bibasilar infiltrates/edema. Tiny bilateral pleural effusions. No pneumothorax. Surgical staples and clips right chest. IMPRESSION: 1. Dual-lumen left subclavian central line stable position. 2. Prior CABG. Cardiomegaly with normal pulmonary vascularity. Improved bibasilar infiltrates/edema. Findings most consistent  improving CHF. Tiny bilateral pleural effusions. 3.  Bibasilar atelectasis . Electronically Signed   By: Marcello Moores  Register   On: 08/21/2017 07:44   Dg Chest Port 1 View  Result Date: 08/20/2017 CLINICAL DATA:  Shortness of breath.  Status post CABG 13 days ago. EXAM: PORTABLE CHEST 1 VIEW COMPARISON:  Portable chest x-ray of August 18, 2017 FINDINGS: The lungs are adequately inflated. There remain increased densities at the right lung base and in the left mid and lower lung. There is small left pleural effusion. The cardiac silhouette is enlarged. The central pulmonary vascularity is mildly prominent but stable. The dual-lumen dialysis catheter is in stable position. The PICC line tip lies in the distal portion of the SVC. There are calcifications in the wall of the thoracic aorta. The visualized sternal wires are intact. IMPRESSION: Persistent left lower lobe and right lower lobe atelectasis or pneumonia. Small left pleural effusion. Mild central pulmonary vascular congestion. Electronically Signed   By: David  Martinique M.D.   On: 08/20/2017 07:55   Dg Chest Port 1 View  Result Date: 08/18/2017 CLINICAL DATA:  Shortness of breath.  CABG. EXAM: PORTABLE CHEST 1 VIEW COMPARISON:  08/16/2017. FINDINGS: Left subclavian line left PICC line in stable position. Prior CABG. Stable cardiomegaly. Improvement of basilar interstitial prominence consistent improving CHF. Mild bibasilar atelectasis . No pleural effusion or pneumothorax. Surgical staples and clips right chest. IMPRESSION: 1.  Left subclavian line and left PICC line in stable position. 2. Prior CABG. Stable cardiomegaly. Improving bilateral interstitial prominence consistent improving CHF. 3. Mild basilar atelectasis. Electronically Signed   By: Marcello Moores  Register   On: 08/18/2017 07:15   Dg Chest Port 1 View  Result Date: 08/16/2017 CLINICAL DATA:  Congestive heart failure EXAM: PORTABLE CHEST 1 VIEW COMPARISON:  08/15/2017 FINDINGS: Mild improvement in  congestive heart failure with edema. Bilateral effusions left greater than right unchanged. Bibasilar atelectasis left greater than right unchanged. Left arm PICC tip in the lower SVC unchanged. Left subclavian dual lumen central venous catheter tip in the left innominate vein unchanged. IMPRESSION: Mild improvement in congestive heart failure and edema. Bibasilar atelectasis and effusion left greater than right Electronically Signed   By: Franchot Gallo M.D.   On: 08/16/2017 14:48   Dg Chest Port 1 View  Result Date: 08/15/2017 CLINICAL DATA:  Status post coronary bypass grafting EXAM: PORTABLE CHEST 1 VIEW COMPARISON:  08/14/2017 FINDINGS: Cardiac shadow remains enlarged. Postsurgical changes are again seen. Aortic calcifications are noted. PICC line is noted in the superior aspect of the right atrium stable from the prior exam. The right lung is clear improved from the prior exam. Stable left retrocardiac atelectasis is noted. No pneumothorax is seen. Left subclavian central line is noted as well. IMPRESSION: Stable left retrocardiac atelectasis. Electronically Signed   By: Inez Catalina M.D.   On: 08/15/2017 08:36   Dg Chest Port 1 View  Result Date: 08/14/2017 CLINICAL DATA:  Sore chest after cardiac surgery EXAM: PORTABLE CHEST 1 VIEW COMPARISON:  Yesterday FINDINGS: Low volume chest with hazy opacity from layering effusions. There is vascular congestion and basal atelectasis. No pneumothorax visible. Stable cardiomegaly. Left upper extremity PICC with tip at the distal SVC. IMPRESSION: 1. Stable from yesterday. 2. Low volume chest with atelectasis, effusions, and vascular congestion. Electronically Signed   By: Monte Fantasia M.D.   On: 08/14/2017 08:05   Dg Chest Port 1 View  Result Date: 08/13/2017 CLINICAL DATA:  Ileus, CABG EXAM: PORTABLE CHEST 1 VIEW COMPARISON:  08/12/2017 FINDINGS: Changes of CABG. Left dialysis catheter remains in place, unchanged. Cardiomegaly with vascular congestion and  mild pulmonary edema. Small layering effusions. IMPRESSION: Mild CHF with small layering effusions. Findings similar to prior study. Electronically Signed   By: Rolm Baptise M.D.   On: 08/13/2017 09:15   Dg Chest Port 1 View  Result Date: 08/12/2017 CLINICAL DATA:  Central line placement EXAM: PORTABLE CHEST 1 VIEW COMPARISON:  08/12/2017 FINDINGS: Left upper extremity catheter tip overlies the SVC. Larger poor left central venous catheter tip poorly visible, appears to overlie the left brachiocephalic region. No pneumothorax is seen. Post sternotomy changes and cutaneous staples over the right chest. Surgical clips in the right axilla. Worsened bibasilar opacification. Cardiomegaly with aortic atherosclerosis. IMPRESSION: 1. Left-sided central venous catheter tip poorly visible, appears to project over the left brachiocephalic region. No pneumothorax. 2. Cardiomegaly with increased vascular congestion and background edema 3. Worsening airspace disease within the bilateral lung bases. Electronically Signed   By: Donavan Foil M.D.   On: 08/12/2017 19:07   Dg Chest Port 1 View  Result Date: 08/12/2017 CLINICAL DATA:  CABG, chest soreness EXAM: PORTABLE CHEST 1 VIEW COMPARISON:  08/11/2017 FINDINGS: Prior CABG. Left PICC line is unchanged. Cardiomegaly with vascular congestion and mild pulmonary edema. Small bilateral effusions suspected. No pneumothorax. IMPRESSION: Continued CHF pattern.  Small bilateral effusions. Electronically Signed   By: Rolm Baptise M.D.   On: 08/12/2017 08:45   Dg Chest Port 1 View  Result Date: 08/11/2017 CLINICAL DATA:  Chest pain following coronary bypass grafting EXAM: PORTABLE CHEST 1 VIEW COMPARISON:  08/10/2017 FINDINGS: Cardiac shadow remains enlarged. Left-sided PICC line is again seen. Right jugular sheath has been removed. Postoperative changes are again noted. Mild vascular congestion and interstitial edema is seen stable from the prior exam. Likely small pleural  effusions remain. No new focal infiltrate is seen. IMPRESSION: Changes of mild CHF. Electronically Signed   By: Inez Catalina M.D.   On: 08/11/2017 07:48   Dg Chest Port 1 View  Result Date: 08/10/2017 CLINICAL DATA:  Left PICC catheter placement. EXAM: PORTABLE CHEST 1 VIEW COMPARISON:  Chest x-ray from same date. FINDINGS: Normal insertion of a left-sided PICC line with the tip projecting near the cavoatrial junction. Unchanged right internal jugular sheath. Stable cardiomegaly with pulmonary vascular congestion and pulmonary edema. Small bilateral pleural effusions are unchanged. No pneumothorax. No acute osseous abnormality. IMPRESSION: 1. Interval placement of a left-sided PICC line with the tip projecting over the cavoatrial junction. 2. Unchanged cardiomegaly with pulmonary edema and small bilateral pleural effusions. Electronically Signed   By: Titus Dubin M.D.   On: 08/10/2017 13:34   Dg Abd Portable 1v  Result Date: 08/15/2017 CLINICAL DATA:  Follow-up ileus EXAM: PORTABLE ABDOMEN - 1 VIEW COMPARISON:  08/14/2017 FINDINGS: Scattered large and small bowel gas is noted. No obstructive changes are seen. Contrast material is noted throughout the colon stable from the prior exam. Diverticulosis without findings of diverticulitis is noted. No free air  is seen. No obstructive changes are noted. IMPRESSION: No acute abnormality noted. No findings to suggest obstructive change are seen. Electronically Signed   By: Inez Catalina M.D.   On: 08/15/2017 08:35   Dg Abd Portable 1v  Result Date: 08/14/2017 CLINICAL DATA:  Ileus EXAM: PORTABLE ABDOMEN - 1 VIEW COMPARISON:  August 13, 2017 FINDINGS: There is contrast in the colon. There are multiple contrast filled colonic diverticulum. There is no bowel dilatation or air-fluid level to suggest bowel obstruction. No free air. Consolidation noted in left lung base. Ovary pacemaker wires are attached to the right heart. IMPRESSION: No bowel dilatation or  air-fluid level to suggest ileus or bowel obstruction. No free air. Multiple colonic filled diverticulum. Persistent left lower lobe consolidation. Right lung base appears clear. Pacemaker wires attached to right heart. Electronically Signed   By: Lowella Grip III M.D.   On: 08/14/2017 08:02   Dg Abd Portable 1v  Result Date: 08/13/2017 CLINICAL DATA:  Ileus EXAM: PORTABLE ABDOMEN - 1 VIEW COMPARISON:  08/11/2017 FINDINGS: Contrast noted throughout nondistended transverse colon and left colon. Continued gaseous distention of the cecum and ascending colon, stable. Contrast noted within sigmoid diverticula. No small bowel distention. No free air. IMPRESSION: Continued moderate gaseous distention of the right colon. Remainder the colon is decompressed with contrast throughout. Filling of sigmoid diverticula. Electronically Signed   By: Rolm Baptise M.D.   On: 08/13/2017 09:14   Dg Abd Portable 1v  Result Date: 08/11/2017 CLINICAL DATA:  Evaluate for ileus EXAM: PORTABLE ABDOMEN - 1 VIEW COMPARISON:  None. FINDINGS: Contrast is seen in the colon. The transverse and descending colon are decompressed. The ascending colon is mildly prominent. There is a paucity of small bowel gas but no definitive dilatation. Linear densities projected over the right side of the abdomen may be on or in patient. Recommend clinical correlation. No other acute abnormalities. IMPRESSION: 1. The ascending colon is mildly prominent with decompression of the transverse and descending colon. This is a nonspecific bowel gas pattern. 2. Linear densities projected over the right side of the abdomen may be on or in the patient. Recommend clinical correlation. Electronically Signed   By: Dorise Bullion III M.D   On: 08/11/2017 18:12   Dg Swallowing Func-speech Pathology  Result Date: 08/27/2017 Objective Swallowing Evaluation: Type of Study: MBS-Modified Barium Swallow Study Patient Details Name: Ryon Layton MRN: [id number] Date of  Birth: 04/17/1932 Today's Date: 08/27/2017 Time: SLP Start Time (ACUTE ONLY): 1120-SLP Stop Time (ACUTE ONLY): 1140 SLP Time Calculation (min) (ACUTE ONLY): 20 min Past Medical History: Past Medical History: Diagnosis Date . BPH (benign prostatic hypertrophy)  . Cancer ([facility])   CANCEROUS MOLE REMOVED FROM BACK - SEVERAL YRS AGO . Chronic kidney disease stage 3  . 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 GERD (gastroesophageal reflux disease)  . Gout  . Hyperlipidemia  . Hypertensive heart disease  . Incontinence of urine  . Memory loss  . Myocardial infarction ([facility]) 1994 . Osteoarthritis  . Paroxysmal atrial fibrillation ([facility])  . Peripheral vascular disease ([place])  . Personal history of transient ischemic attack (TIA) and cerebral infarction without residual deficit  . Right pontine stroke ([place]) 02/27/2015 . Tooth disease  Past Surgical History: Past Surgical History: Procedure Laterality Date . CARDIAC CATHETERIZATION  07/21/2005 . CORONARY ARTERY BYPASS GRAFT N/A 08/07/2017  Procedure: CORONARY ARTERY BYPASS GRAFTING (CABG) x 2 WITH ENDOSCOPIC HARVESTING OF RIGHT SAPHENOUS VEIN.;  Surgeon: Ivin Poot, MD;  Location: [facility];  Service: Open Heart Surgery;  Laterality: N/A; . HERNIA REPAIR  12/26/2002  BILATERAL INGUINAL HERNIA REPAIR . LEFT HEART CATH AND CORONARY ANGIOGRAPHY N/A 08/03/2017  Procedure: LEFT HEART CATH AND CORONARY ANGIOGRAPHY;  Surgeon: Sherren Mocha, MD;  Location: [facility];  Service: Cardiovascular;  Laterality: N/A; . MASTOID SURGERY X 5   . PROSTATE SURGERY   . TEE WITHOUT CARDIOVERSION N/A 08/07/2017  Procedure: TRANSESOPHAGEAL ECHOCARDIOGRAM (TEE);  Surgeon: Prescott Gum, Collier Salina, MD;  Location: [facility];  Service: Open Heart Surgery;  Laterality: N/A; . TOTAL KNEE ARTHROPLASTY Left 03/28/2013  Procedure: LEFT TOTAL KNEE ARTHROPLASTY;  Surgeon: Gearlean Alf, MD;  Location: [facility];  Service: Orthopedics;  Laterality: Left; HPI: Pt is an 83 yo male admitted with NSTEMI s/p cath  which showed severe left main disease and total right coronary artery stenosis, potential CABG planned for 9/14. 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 Was evaluated by ST this admission prior to CABG; MBS 08/05/17 revealed mild oropharyngeal and cervical esophageal dysphagia, suspected to be more chronic in nature given his h/o dysphagia from prior CVA and h/o GERD. Dys 3, thin liquids, no straws recommended with ST to follow briefly through surgery given his history. Pt was intubated for CABG on 9/14, transported back to floor on vent and subsequent extubated at 10pm same day. Pt was consuming Dys 2, nectar > developed ileus > NPO and diagnostic reassessment this morning.   Subjective: RN reports pt coughing with PO s/p CABG Assessment / Plan / Recommendation [facility] IP CLINICAL IMPRESSIONS 08/27/2017 Clinical Impression Patient with a mild motor based oropharyngeal dysphagia. Patient with oral holding of bolus at the base of tongue in preparation for swallow, at times in excess of 5 seconds, however swallow initiated either at or above the level of the vallecula with all solid and nectar thick liquid consistencies. With thin liquids however, swallow is delayed to the level of the pyriform sinuses which when combined with decreased laryngeal closure, results in deep silent penetration to the vocal cords. although occurring in only 25-30% of trials, cued cough weak and unsuccessful to clear hte airway resulting in a significant risk of post swallow aspiration. Given decreased functional reserve with acute illness, recommend dysphagia 3 solids with nectar thick liquids. Prognosis for ability to advance diet good with improved medical status and conditioning. Will f/u.  SLP Visit Diagnosis Dysphagia, unspecified (R13.10) Attention and concentration deficit following -- Frontal lobe and executive function deficit  following -- Impact on safety and function Moderate aspiration risk   [facility] IP TREATMENT RECOMMENDATION 08/27/2017 Treatment Recommendations Therapy as outlined in treatment plan below   Prognosis 08/27/2017 Prognosis for Safe Diet Advancement Good Barriers to Reach Goals -- Barriers/Prognosis Comment -- [facility] IP DIET RECOMMENDATION 08/27/2017 SLP Diet Recommendations Dysphagia 3 (Mech soft) solids;Nectar thick liquid Liquid Administration via Cup;No straw Medication Administration Whole meds with puree Compensations Slow rate;Small sips/bites Postural Changes Seated upright at 90 degrees   [facility] IP OTHER RECOMMENDATIONS 08/27/2017 Recommended Consults -- Oral Care Recommendations Oral care BID Other Recommendations Order thickener from pharmacy;Prohibited food (jello, ice cream, thin soups);Remove water pitcher   [facility] IP FOLLOW UP RECOMMENDATIONS 08/27/2017 Follow up Recommendations Skilled Nursing facility   [facility] IP FREQUENCY AND DURATION 08/27/2017 Speech Therapy Frequency (ACUTE ONLY) min 2x/week Treatment Duration 2 weeks      [facility] IP ORAL PHASE 08/27/2017 Oral Phase Impaired Oral -  Pudding Teaspoon -- Oral - Pudding Cup -- Oral - Honey Teaspoon -- Oral - Honey Cup -- Oral - Nectar Teaspoon Delayed oral transit;Lingual pumping;Holding of bolus Oral - Nectar Cup Delayed oral transit;Lingual pumping;Holding of bolus Oral - Nectar Straw -- Oral - Thin Teaspoon -- Oral - Thin Cup Delayed oral transit;Lingual pumping;Holding of bolus Oral - Thin Straw Delayed oral transit;Lingual pumping;Holding of bolus Oral - Puree Delayed oral transit;Lingual pumping;Holding of bolus Oral - Mech Soft Delayed oral transit;Lingual pumping;Holding of bolus Oral - Regular -- Oral - Multi-Consistency -- Oral - Pill NT Oral Phase - Comment --  [facility] IP PHARYNGEAL PHASE 08/27/2017 Pharyngeal Phase Impaired Pharyngeal- Pudding Teaspoon -- Pharyngeal -- Pharyngeal- Pudding Cup -- Pharyngeal -- Pharyngeal- Honey Teaspoon -- Pharyngeal -- Pharyngeal- Honey  Cup -- Pharyngeal -- Pharyngeal- Nectar Teaspoon Delayed swallow initiation-vallecula;Reduced tongue base retraction;Pharyngeal residue - valleculae Pharyngeal -- Pharyngeal- Nectar Cup Delayed swallow initiation-vallecula;Reduced tongue base retraction;Pharyngeal residue - valleculae Pharyngeal -- Pharyngeal- Nectar Straw -- Pharyngeal -- Pharyngeal- Thin Teaspoon Delayed swallow initiation-pyriform sinuses;Reduced tongue base retraction;Pharyngeal residue - valleculae;Reduced anterior laryngeal mobility Pharyngeal -- Pharyngeal- Thin Cup Delayed swallow initiation-pyriform sinuses;Penetration/Aspiration during swallow;Reduced tongue base retraction;Pharyngeal residue - valleculae;Reduced anterior laryngeal mobility Pharyngeal Material enters airway, CONTACTS cords and not ejected out Pharyngeal- Thin Straw Delayed swallow initiation-pyriform sinuses;Reduced tongue base retraction;Reduced anterior laryngeal mobility;Penetration/Aspiration during swallow;Pharyngeal residue - valleculae Pharyngeal Material enters airway, CONTACTS cords and not ejected out Pharyngeal- Puree Delayed swallow initiation-vallecula;Reduced tongue base retraction;Pharyngeal residue - valleculae Pharyngeal -- Pharyngeal- Mechanical Soft Delayed swallow initiation-vallecula;Reduced tongue base retraction;Pharyngeal residue - valleculae Pharyngeal -- Pharyngeal- Regular -- Pharyngeal -- Pharyngeal- Multi-consistency -- Pharyngeal -- Pharyngeal- Pill NT Pharyngeal -- Pharyngeal Comment --  [facility] IP CERVICAL ESOPHAGEAL PHASE 08/27/2017 Cervical Esophageal Phase WFL Pudding Teaspoon -- Pudding Cup -- Honey Teaspoon -- Honey Cup -- Nectar Teaspoon -- Nectar Cup -- Nectar Straw -- Thin Teaspoon -- Thin Cup -- Thin Straw -- Puree -- Mechanical Soft -- Regular -- Multi-consistency -- Pill -- Cervical Esophageal Comment -- [facility] IP GO 03/28/2015 Functional Assessment Tool Used MBSS Functional Limitations Swallowing Swallow Current Status (R4270) CI Swallow  Goal Status (W2376) CI Swallow Discharge Status (E8315) CI Motor Speech Current Status (V7616) (None) Motor Speech Goal Status (W7371) (None) Motor Speech Goal Status (G6269) (None) Spoken Language Comprehension Current Status (S8546) (None) Spoken Language Comprehension Goal Status (E7035) (None) Spoken Language Comprehension Discharge Status (K0938) (None) Spoken Language Expression Current Status (H8299) (None) Spoken Language Expression Goal Status (B7169) (None) Spoken Language Expression Discharge Status (C7893) (None) Attention Current Status (Y1017) (None) Attention Goal Status (P1025) (None) Attention Discharge Status (E5277) (None) Memory Current Status (O2423) (None) Memory Goal Status (N3614) (None) Memory Discharge Status (E3154) (None) Voice Current Status (M0867) (None) Voice Goal Status (Y1950) (None) Voice Discharge Status (D3267) (None) Other Speech-Language Pathology Functional Limitation Current Status (T2458) (None) Other Speech-Language Pathology Functional Limitation Goal Status (K9983) (None) Other Speech-Language Pathology Functional Limitation Discharge Status [phone]) (None) Gabriel Rainwater MA, CCC-SLP [phone] McCoy Leah Meryl 08/27/2017, 1:55 PM              US Abdomen Limited Ruq  Result Date: 08/12/2017 CLINICAL DATA:  Vomiting. EXAM: ULTRASOUND ABDOMEN LIMITED RIGHT UPPER QUADRANT COMPARISON:  CT scan of July 07, 2013. FINDINGS: Gallbladder: No gallstones or wall thickening visualized. No sonographic Murphy sign noted by sonographer. Sludge is noted in the gallbladder. Common bile duct: Diameter: 3 mm which is within normal limits. Liver: No focal lesion identified, although left hepatic lobe is  not visualized due to overlying bandage. Within normal limits in parenchymal echogenicity. Portal vein is patent on color Doppler imaging with normal direction of blood flow towards the liver. IMPRESSION: Exam is somewhat limited due to overlying bandage. Sludge is noted in gallbladder  lumen. No other definite abnormality seen in the right upper quadrant of the abdomen. Electronically Signed   By: Marijo Conception, M.D.   On: 08/12/2017 09:45    Assessment/Plan  I #1 constipation-he has been started on Linzess---vomiting was a concern but apparently is doing better today actually ate a good breakfast appears to be more comfortable is not complaining of abdominal discomfort-this continues to be somewhat of a challenging situation at this point will continue the Linzess and Colace and monitor-again abdominal x-ray only showed mild constipation  #2 pneumonia he is on Augmentin through October 21 clinically appears to be stable he did have a mildly elevated white count yesterday which is normalized-at this point will monitor.  #3 hypernatremia again this appears to have stabilized and improved with a sodium 145 family and nursing staff continue to push fluids as well have to continue to be encouraged.--Will update labs on Monday, October 22  #4-#4 atrial fibrillation Lopressor was just restarted yesterday this appears to be stable rate appears to be controlled continues on amiodarone as well--he is on Coumadin which is currently being held secondary to supratherapeutic level which is now normalized  I discussed this with Dr. Lyndel Safe via phone and will restart Coumadin tomorrow night at 1 mg Will get 1 mg 4 days a week on Thursday Saturday Monday and Tuesday will update an INR on Monday, October 22  [id number]  Manual blood pressure showed stability with a systolic of 130 this will have to be watched.  #5 history coronary artery disease status post CABG 2 continues on statin and aspirin does have cardiology follow-up scheduled.  #6 history of chronic kidney disease creatinine today appears to be at baseline at 3.41 with a BUN of 54.  Again he appears to be feeling somewhat better today but he is a very fragile individual at this point continue to monitor labs today were reassuring  with a normal white count stable hemoglobin and sodium of 145.  [id number]

## 2017-09-10 ENCOUNTER — Ambulatory Visit (HOSPITAL_COMMUNITY)
Admit: 2017-09-10 | Discharge: 2017-09-10 | Disposition: A | Discharge status: Home / Self Care | Payer: Medicare Other | Attending: Internal Medicine | Admitting: Internal Medicine

## 2017-09-10 ENCOUNTER — Ambulatory Visit (HOSPITAL_COMMUNITY): Payer: Medicare Other | Attending: Internal Medicine | Admitting: Speech Pathology

## 2017-09-10 DIAGNOSIS — R1310 Dysphagia, unspecified: Secondary | ICD-10-CM | POA: Insufficient documentation

## 2017-09-10 DIAGNOSIS — R1319 Other dysphagia: Secondary | ICD-10-CM

## 2017-09-10 DIAGNOSIS — R1312 Dysphagia, oropharyngeal phase: Secondary | ICD-10-CM | POA: Insufficient documentation

## 2017-09-14 ENCOUNTER — Encounter (HOSPITAL_COMMUNITY)
Admission: RE | Admit: 2017-09-14 | Discharge: 2017-09-14 | Disposition: A | Discharge status: Home / Self Care | Payer: Medicare Other | Source: Skilled Nursing Facility | Attending: *Deleted | Admitting: *Deleted

## 2017-09-14 LAB — CBC WITH DIFFERENTIAL/PLATELET
BASOS PCT: 0 %
Basophils Absolute: 0 10*3/uL (ref 0.0–0.1)
EOS ABS: 0.1 10*3/uL (ref 0.0–0.7)
Eosinophils Relative: 2 %
HCT: 31.6 % — ABNORMAL LOW (ref 39.0–52.0)
Hemoglobin: 10.2 g/dL — ABNORMAL LOW (ref 13.0–17.0)
Lymphocytes Relative: 17 %
Lymphs Abs: 0.9 10*3/uL (ref 0.7–4.0)
MCH: 34.9 pg — ABNORMAL HIGH (ref 26.0–34.0)
MCHC: 32.3 g/dL (ref 30.0–36.0)
MCV: 108.2 fL — ABNORMAL HIGH (ref 78.0–100.0)
MONO ABS: 0.3 10*3/uL (ref 0.1–1.0)
Monocytes Relative: 5 %
Neutro Abs: 4.1 10*3/uL (ref 1.7–7.7)
Neutrophils Relative %: 77 %
Platelets: 95 10*3/uL — ABNORMAL LOW (ref 150–400)
RBC: 2.92 MIL/uL — ABNORMAL LOW (ref 4.22–5.81)
RDW: 16.8 % — AB (ref 11.5–15.5)
WBC: 5.4 10*3/uL (ref 4.0–10.5)

## 2017-09-14 LAB — BASIC METABOLIC PANEL
Anion gap: 7 (ref 5–15)
BUN: 37 mg/dL — AB (ref 6–20)
CO2: 25 mmol/L (ref 22–32)
Calcium: 8.6 mg/dL — ABNORMAL LOW (ref 8.9–10.3)
Chloride: 110 mmol/L (ref 101–111)
Creatinine, Ser: 2.37 mg/dL — ABNORMAL HIGH (ref 0.61–1.24)
GFR calc Af Amer: 28 mL/min — ABNORMAL LOW (ref >60)
GFR, EST NON AFRICAN AMERICAN: 24 mL/min — AB (ref >60)
Glucose, Bld: 91 mg/dL (ref 65–99)
Potassium: 4.2 mmol/L (ref 3.5–5.1)
SODIUM: 142 mmol/L (ref 135–145)

## 2017-09-14 LAB — PROTIME-INR
INR: 1.24
Prothrombin Time: 15.5 seconds — ABNORMAL HIGH (ref 11.4–15.2)

## 2017-09-17 ENCOUNTER — Non-Acute Institutional Stay (SKILLED_NURSING_FACILITY): Payer: Medicare Other | Admitting: Internal Medicine

## 2017-09-17 ENCOUNTER — Encounter: Payer: Self-pay | Admitting: Internal Medicine

## 2017-09-17 ENCOUNTER — Encounter (HOSPITAL_COMMUNITY)
Admission: RE | Admit: 2017-09-17 | Discharge: 2017-09-17 | Disposition: A | Discharge status: Home / Self Care | Payer: Medicare Other | Source: Skilled Nursing Facility | Attending: Internal Medicine | Admitting: Internal Medicine

## 2017-09-17 ENCOUNTER — Encounter (HOSPITAL_COMMUNITY): Payer: Self-pay | Admitting: Speech Pathology

## 2017-09-17 DIAGNOSIS — R21 Rash and other nonspecific skin eruption: Secondary | ICD-10-CM

## 2017-09-17 DIAGNOSIS — I480 Paroxysmal atrial fibrillation: Secondary | ICD-10-CM

## 2017-09-17 DIAGNOSIS — N183 Chronic kidney disease, stage 3 unspecified: Secondary | ICD-10-CM

## 2017-09-17 DIAGNOSIS — E870 Hyperosmolality and hypernatremia: Secondary | ICD-10-CM | POA: Diagnosis not present

## 2017-09-17 DIAGNOSIS — I2510 Atherosclerotic heart disease of native coronary artery without angina pectoris: Secondary | ICD-10-CM | POA: Diagnosis not present

## 2017-09-17 DIAGNOSIS — Z7901 Long term (current) use of anticoagulants: Secondary | ICD-10-CM | POA: Insufficient documentation

## 2017-09-17 DIAGNOSIS — E785 Hyperlipidemia, unspecified: Secondary | ICD-10-CM | POA: Insufficient documentation

## 2017-09-17 DIAGNOSIS — D631 Anemia in chronic kidney disease: Secondary | ICD-10-CM | POA: Insufficient documentation

## 2017-09-17 DIAGNOSIS — Z48812 Encounter for surgical aftercare following surgery on the circulatory system: Secondary | ICD-10-CM | POA: Insufficient documentation

## 2017-09-17 LAB — PROTIME-INR
INR: 1.28
Prothrombin Time: 15.9 seconds — ABNORMAL HIGH (ref 11.4–15.2)

## 2017-09-17 NOTE — Progress Notes (Signed)
Location:   [facility] Room Number: 134/P Place of Service:  SNF [phone]) Provider:  Imogene Burn, MD  Patient Care Team: Glenda Chroman, MD as PCP - General (Internal Medicine) Jacolyn Reedy, MD as Consulting Physician (Cardiology)  Extended Emergency Contact Information Primary Emergency Contact: Stanley Robles, [place] [address] [name] of [place] Phone: [phone] Mobile Phone: [phone] Relation: Daughter Secondary Emergency Contact: [name] Address: [address]. [place], [place] [address] [place] Phone: [phone] Relation: Daughter  Code Status:  Full Code Goals of care: Advanced Directive information Advanced Directives 09/17/2017  Does Patient Have a Medical Advance Directive? Yes  Type of Advance Directive (No Data)  Does patient want to make changes to medical advance directive? No - Patient declined  Copy of [place] in Chart? -  Would patient like information on creating a medical advance directive? No - Patient declined  Pre-existing out of facility DNR order (yellow form or pink MOST form) -     Chief Complaint  Patient presents with  . Acute Visit    Rash  Also follow-up of vomiting-hypernatremia-A. fib-coronary artery disease-  HPI:  Pt is a 83 y.o. male seen today for an acute visit for follow-up of vomiting-as well as hypernatremia coronary artery disease and atrial fibrillation-also follow-up of a rash which apparently has developed on his lower legs bilaterally.  Patient was initially admitted skilled nursing therapy after undergoing CABG 2 in mid September with the right leg septal phone he is maintaining and also right axillary cannulation.  He did develop acute renal failure that required dialysis for short time as well as aspiration pneumonia and rapid rate A. fib.  He did have a previous history of A. fib as well he had been  anticoagulated with  Eliquis previously now is on Coumadin with hopes that this can be switched back to oral version at some point.  INR today is low at one point to a Dr. Lyndel Safe has assess this and Coumadin dose has been increased with update INR for next week.  He also has completed course of Augmentin for recurrent aspiration pneumonia and this appears to have improved.  When I saw him last week he did have some vomiting episodes-appears this has resolved his appetite has improved and appears he is gaining a small amountof weight--  Abdominal x-ray did show constipation he was started onLinzess he is also on Colace and this appears to be helping.  This was also complicated with hypernatremia which appears to have resolved on lab done October 22 sodium was back to normal range at 142 he does have chronic kidney disease creatinine actually showed some improvement however at 2.37.  He says he is feeling significantly better is resting comfortably in his recliner appears to be in better spirits.  His main complaint is a rash that developed on his lower legs bilaterally he says it does not really itch he does not complain of any rashes elsewhere       Past Medical History:  Diagnosis Date  . BPH (benign prostatic hypertrophy)   . 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 Chronic kidney disease stage 3   . Coronary artery disease    DR. TILLEY IS PT'S CARDIOLGIST  . GERD (gastroesophageal reflux disease)   .  Gout   . Hyperlipidemia   . Hypertensive heart disease   . Incontinence of urine   . Memory loss   . Myocardial infarction ([place]) 1994  . Osteoarthritis   . Paroxysmal atrial fibrillation (HCC)   . Peripheral vascular disease ([place])   . Personal history of transient ischemic attack (TIA) and cerebral infarction without residual deficit   . Right pontine stroke ([place]) 02/27/2015  . Tooth disease    Past Surgical History:  Procedure Laterality Date  .  CARDIAC CATHETERIZATION  07/21/2005  . CORONARY ARTERY BYPASS GRAFT N/A 08/07/2017   Procedure: CORONARY ARTERY BYPASS GRAFTING (CABG) x 2 WITH ENDOSCOPIC HARVESTING OF RIGHT SAPHENOUS VEIN.;  Surgeon: Ivin Poot, MD;  Location: [facility];  Service: Open Heart Surgery;  Laterality: N/A;  . HERNIA REPAIR  12/26/2002   BILATERAL INGUINAL HERNIA REPAIR  . LEFT HEART CATH AND CORONARY ANGIOGRAPHY N/A 08/03/2017   Procedure: LEFT HEART CATH AND CORONARY ANGIOGRAPHY;  Surgeon: Sherren Mocha, MD;  Location: [facility];  Service: Cardiovascular;  Laterality: N/A;  . MASTOID SURGERY X 5    . PROSTATE SURGERY    . TEE WITHOUT CARDIOVERSION N/A 08/07/2017   Procedure: TRANSESOPHAGEAL ECHOCARDIOGRAM (TEE);  Surgeon: Prescott Gum, Collier Salina, MD;  Location: [facility];  Service: Open Heart Surgery;  Laterality: N/A;  . TOTAL KNEE ARTHROPLASTY Left 03/28/2013   Procedure: LEFT TOTAL KNEE ARTHROPLASTY;  Surgeon: Gearlean Alf, MD;  Location[facility];  Service: Orthopedics;  Laterality: Left;    Allergies  Allergen Reactions  . Lipitor [Atorvastatin] Other (See Comments)    Severe constipation    Outpatient Encounter Prescriptions as of 09/17/2017  Medication Sig  . acetaminophen (TYLENOL) 500 MG tablet Take 1,000 mg by mouth every 8 (eight) hours as needed.  [place] Kitchen allopurinol (ZYLOPRIM) 300 MG tablet Take 0.5 tablets (150 mg total) by mouth daily.  [place] Kitchen amiodarone (PACERONE) 200 MG tablet Take 1 tablet (200 mg total) by mouth daily.  [place] Kitchen aspirin EC 81 MG EC tablet Take 1 tablet (81 mg total) by mouth daily.  . Cholecalciferol (VITAMIN D3) 2000 units TABS Take 2,000 Units by mouth daily.  [place] Kitchen docusate sodium (COLACE) 100 MG capsule Take 100 mg by mouth daily.  . feeding supplement, ENSURE ENLIVE, (ENSURE ENLIVE) LIQD Take 237 mLs by mouth 2 (two) times daily between meals.  . finasteride (PROSCAR) 5 MG tablet Take 5 mg by mouth daily.  [place] Kitchen HYDROcodone-acetaminophen (NORCO/VICODIN) 5-325 MG tablet Take 1 tablet by mouth  every 6 (six) hours as needed for moderate pain.  [place] Kitchen linaclotide (LINZESS) 72 MCG capsule Take 72 mcg by mouth daily before breakfast.  . metoprolol tartrate (LOPRESSOR) 25 MG tablet Take 12.5 mg by mouth 2 (two) times daily.  [place] Kitchen omeprazole (PRILOSEC) 20 MG capsule Take 20 mg by mouth 2 (two) times daily before a meal.   . ondansetron (ZOFRAN) 4 MG tablet Take 4 mg by mouth every 8 (eight) hours as needed for nausea or vomiting.  . ondansetron (ZOFRAN) 4 MG tablet Take 4 mg by mouth every 6 (six) hours as needed for nausea or vomiting.  . polyethylene glycol (MIRALAX / GLYCOLAX) packet Take 17 g by mouth daily.  . rosuvastatin (CRESTOR) 10 MG tablet Take 1 tablet (10 mg total) by mouth daily.  [place] Kitchen warfarin (COUMADIN) 1 MG tablet Take 1 mg by mouth every evening.  . [DISCONTINUED] amoxicillin-clavulanate (AUGMENTIN) 500-125 MG tablet Take 1 tablet by mouth 2 (two) times daily.   No facility-administered encounter  medications on file as of 09/17/2017.     Review of Systems   In general does not complaining any fever or chills appears to have gained a small amount await says he feels he is doing better than last week.  Skin does not complain of diaphoresis but has to rule out small rash on his lower legs bilaterally.  Head ears eyes nose mouth and throat does not complaining of any visual changes Or sore throat  Resp--t does not complain of increased shortness of breath beyond baseline or increased cough. Has completed antibiotic for pneumonia  Cardiac does not complaining of any chest pain or significant lower extremity edema.  Musculoskeletal does not really complain of joint pain continues have some weakness lower extremities but appears she's feeling stronger.  Neurologic is not complaining dizziness headache or syncope.  Psych continues to improve and mood-appears to be in better spirits is feeling better looking forward to going home eventually.     There is no immunization history  on file for this patient. Pertinent  Health Maintenance Due  Topic Date Due  . INFLUENZA VACCINE  10/24/2017 (Originally 06/24/2017)  . PNA vac Low Risk Adult (1 of 2 - PCV13) 10/24/2017 (Originally 10/20/1999)   Fall Risk  10/27/2016 10/30/2015  Falls in the past year? No No   Functional Status Survey:    He is afebrile pulse is 84 respirations of 18 blood pressure 136/68 manually weight is 158.4 this appears again of about 4 pounds over the past 2 weeks  Physical Exam  in general this is a somewhat frail elderly male who appears stronger today than he did last week he is sitting comfortably in his recliner.  His skin is warm and dry he does have a macular papular rash above his ankles bilaterally this has somewhat of a linear presentation appears to possibly have some rolled borders with a small amount of central clearing.  Surgical site mid chest appears unremarkable with well healed crusting no sign of infection  Eyes sclerae and conjunctivae appear to be clear visual acuity appears grossly intact.  Oropharynx is clear mucous membranes moist.  Chest is clear to auscultation with somewhat shallow air entry there is no labored breathing.  Heart is regular rate and rhythm with occasional irregular beat without murmur gallop or rub he does not have significant lower extremity edema.  Abdomen is soft nontender with active bowel sounds.  Musculoskeletal is able to move all extremities 4 limited exam since he is sitting in his recliner upper extremity strength appears preserved continues with some lower extremity weakness with deconditioning.  Neurologic appears grossly intact without lateralizing findings her speech is clear.  Psych he is alert and oriented pleasant and appropriate appears to be in improving spirits  Labs reviewed:  Recent Labs  08/17/17 0501  08/18/17 0355  08/19/17 0332  08/26/17 0504 08/27/17 0452 08/28/17 1620  09/08/17 1045 09/09/17 0712 09/14/17 0700    NA 133*  < > 131*  < > 130*  < > 138 142 146*  < > 145 145 142  K 3.9  < > 3.3*  < > 3.5  < > 3.6 3.7 4.2  < > 4.2 4.2 4.2  CL 101  < > 97*  < > 96*  < > 101 106 115*  < > 109 109 110  CO2 22  < > 21*  --  20*  < > 22 22 23   < > 25 27 25   GLUCOSE 126*  < >  117*  < > 103*  < > 107* 103* 113*  < > 141* 108* 91  BUN 22*  < > 46*  < > 65*  < > 100* 100* 84*  < > 55* 54* 37*  CREATININE 1.42*  < > 2.73*  < > 3.77*  < > 5.17* 4.79* 3.90*  < > 3.68* 3.41* 2.37*  CALCIUM 7.5*  < > 8.0*  --  8.3*  < > 9.5 9.2 9.2  < > 9.5 9.0 8.6*  MG 1.8  --  1.9  --  1.9  --   --   --   --   --   --   --   --   PHOS 2.0*  < > 5.4*  --  5.6*  < > 5.3* 4.8* 4.1  --   --   --   --   < > = values in this interval not displayed.  Recent Labs  09/06/17 0719 09/08/17 1045 09/09/17 0712  AST 16 19 15   ALT 16* 16* 16*  ALKPHOS 84 91 81  BILITOT 0.5 0.6 0.6  PROT 5.1* 5.8* 5.7*  ALBUMIN 2.2* 2.4* 2.2*    Recent Labs  09/08/17 1045 09/09/17 0712 09/14/17 0700  WBC 11.7* 8.0 5.4  NEUTROABS 10.1* 6.3 4.1  HGB 11.1* 10.5* 10.2*  HCT 35.8* 33.8* 31.6*  MCV 110.8* 110.1* 108.2*  PLT 138* 119* 95*   Lab Results  Component Value Date   TSH 3.035 08/04/2017   Lab Results  Component Value Date   HGBA1C 5.3 08/04/2017   Lab Results  Component Value Date   CHOL 162 02/28/2015   HDL 31 (L) 02/28/2015   LDLCALC 102 (H) 02/28/2015   TRIG 104 08/17/2017   CHOLHDL 5.2 02/28/2015    Significant Diagnostic Results in last 30 days:  Dg Chest 2 View  Result Date: 08/26/2017 CLINICAL DATA:  Status post CABG on August 07, 2017 EXAM: CHEST  2 VIEW COMPARISON:  Portable chest x-ray of August 21, 2017. FINDINGS: The lungs are adequately inflated. There is persistent increased density in the retrocardiac region. There is no pneumothorax or significant pleural effusion. The cardiac silhouette is top-normal in size. The pulmonary vascularity is not engorged. The sternal wires are intact. The left PICC line tip  projects over the midportion of the SVC. The bony thorax exhibits no acute abnormality. IMPRESSION: Persistent left lower lobe atelectasis or pneumonia. No significant pulmonary edema or pleural effusion. Electronically Signed   By: David  Martinique M.D.   On: 08/26/2017 07:45   Dg Op Swallowing Func-medicare/speech Path  Result Date: 09/10/2017 CLINICAL DATA:  Dysphagia since open heart surgery in September 2018, history of prior stroke, coronary disease post MI and CABG, hypertension, atrial fibrillation EXAM: MODIFIED BARIUM SWALLOW TECHNIQUE: Different consistencies of barium were administered orally to the patient by the Speech Pathologist. Imaging of the pharynx was performed in the lateral projection. FLUOROSCOPY TIME:  Fluoroscopy Time:  3 minutes 18 seconds Radiation Exposure Index (if provided by the fluoroscopic device): 34.6 mGy Number of Acquired Spot Images: multiple fluoroscopic screen captures COMPARISON:  08/05/2017 FINDINGS: Thin liquid- with sips by teaspoon, premature spillover to the vallecular and piriform sinuses is seen. Delayed initiation of swallow. No laryngeal penetration or aspiration. With self cup presentation, premature spillover and delayed initiation RIGHT again identified associated with laryngeal penetration and aspiration of contrast into the proximal trachea. A weak spontaneous cough reflex was seen. With teaspoon presentation and prolonged holding, no laryngeal penetration or  aspiration occurred. Minimal vallecular residual noted. Laryngeal penetration and aspiration were not identified on the prior exam. Nectar thick liquid- premature spillover by teaspoon and cup to vallecular. Delayed initiation. Laryngeal penetration occurred following cup presentation without gross aspiration Honey- not evaluated Pure- with applesauce consistency, slightly delayed initiation of swallow. No laryngeal penetration or aspiration Cracker-delayed initiation without laryngeal penetration or  aspiration Pure with cracker- not evaluated Barium tablet -  within normal limits IMPRESSION: Swallowing dysfunction as above. Please refer to the Speech Pathologists report for complete details and recommendations. Electronically Signed   By: Lavonia Dana M.D.   On: 09/10/2017 15:28   Dg Chest Port 1 View  Result Date: 08/21/2017 CLINICAL DATA:  CABG. EXAM: PORTABLE CHEST 1 VIEW COMPARISON:  08/20/2017 . FINDINGS: Dual lumen left subclavian central line in stable position. Prior CABG. Cardiomegaly with normal pulmonary vascularity. Bibasilar atelectasis. Improved bibasilar infiltrates/edema. Tiny bilateral pleural effusions. No pneumothorax. Surgical staples and clips right chest. IMPRESSION: 1. Dual-lumen left subclavian central line stable position. 2. Prior CABG. Cardiomegaly with normal pulmonary vascularity. Improved bibasilar infiltrates/edema. Findings most consistent improving CHF. Tiny bilateral pleural effusions. 3.  Bibasilar atelectasis . Electronically Signed   By: Marcello Moores  Register   On: 08/21/2017 07:44   Dg Chest Port 1 View  Result Date: 08/20/2017 CLINICAL DATA:  Shortness of breath.  Status post CABG 13 days ago. EXAM: PORTABLE CHEST 1 VIEW COMPARISON:  Portable chest x-ray of August 18, 2017 FINDINGS: The lungs are adequately inflated. There remain increased densities at the right lung base and in the left mid and lower lung. There is small left pleural effusion. The cardiac silhouette is enlarged. The central pulmonary vascularity is mildly prominent but stable. The dual-lumen dialysis catheter is in stable position. The PICC line tip lies in the distal portion of the SVC. There are calcifications in the wall of the thoracic aorta. The visualized sternal wires are intact. IMPRESSION: Persistent left lower lobe and right lower lobe atelectasis or pneumonia. Small left pleural effusion. Mild central pulmonary vascular congestion. Electronically Signed   By: David  Martinique M.D.   On:  08/20/2017 07:55   Dg Swallowing Func-speech Pathology  Result Date: 08/27/2017 Objective Swallowing Evaluation: Type of Study: MBS-Modified Barium Swallow Study Patient Details Name: Stanley Robles MRN: [id number] Date of Birth: 1931-12-12 Today's Date: 08/27/2017 Time: SLP Start Time (ACUTE ONLY): 1120-SLP Stop Time (ACUTE ONLY): 1140 SLP Time Calculation (min) (ACUTE ONLY): 20 min Past Medical History: Past Medical History: Diagnosis Date . BPH (benign prostatic hypertrophy)  . 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 Chronic kidney disease stage 3  . 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 GERD (gastroesophageal reflux disease)  . Gout  . Hyperlipidemia  . Hypertensive heart disease  . Incontinence of urine  . Memory loss  . Myocardial infarction ([place]) 1994 . Osteoarthritis  . Paroxysmal atrial fibrillation ([facility])  . Peripheral vascular disease ([place])  . Personal history of transient ischemic attack (TIA) and cerebral infarction without residual deficit  . Right pontine stroke ([facility]) 02/27/2015 . Tooth disease  Past Surgical History: Past Surgical History: Procedure Laterality Date . CARDIAC CATHETERIZATION  07/21/2005 . CORONARY ARTERY BYPASS GRAFT N/A 08/07/2017  Procedure: CORONARY ARTERY BYPASS GRAFTING (CABG) x 2 WITH ENDOSCOPIC HARVESTING OF RIGHT SAPHENOUS VEIN.;  Surgeon: Ivin Poot, MD;  Location: [facility];  Service: Open Heart Surgery;  Laterality: N/A; . HERNIA REPAIR  12/26/2002  BILATERAL INGUINAL HERNIA  REPAIR . LEFT HEART CATH AND CORONARY ANGIOGRAPHY N/A 08/03/2017  Procedure: LEFT HEART CATH AND CORONARY ANGIOGRAPHY;  Surgeon: Sherren Mocha, MD;  Location: [facility];  Service: Cardiovascular;  Laterality: N/A; . MASTOID SURGERY X 5   . PROSTATE SURGERY   . TEE WITHOUT CARDIOVERSION N/A 08/07/2017  Procedure: TRANSESOPHAGEAL ECHOCARDIOGRAM (TEE);  Surgeon: Prescott Gum, Collier Salina, MD;  Location: [facility];  Service: Open Heart Surgery;  Laterality: N/A; .  TOTAL KNEE ARTHROPLASTY Left 03/28/2013  Procedure: LEFT TOTAL KNEE ARTHROPLASTY;  Surgeon: Gearlean Alf, MD;  [facility];  Service: Orthopedics;  Laterality: Left; HPI: Pt is an 83 yo male admitted with NSTEMI s/p cath which showed severe left main disease and total right coronary artery stenosis, potential CABG planned for 9/14. 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 Was evaluated by ST this admission prior to CABG; MBS 08/05/17 revealed mild oropharyngeal and cervical esophageal dysphagia, suspected to be more chronic in nature given his h/o dysphagia from prior CVA and h/o GERD. Dys 3, thin liquids, no straws recommended with ST to follow briefly through surgery given his history. Pt was intubated for CABG on 9/14, transported back to floor on vent and subsequent extubated at 10pm same day. Pt was consuming Dys 2, nectar > developed ileus > NPO and diagnostic reassessment this morning.   Subjective: RN reports pt coughing with PO s/p CABG Assessment / Plan / Recommendation CHL IP CLINICAL IMPRESSIONS 08/27/2017 Clinical Impression Patient with a mild motor based oropharyngeal dysphagia. Patient with oral holding of bolus at the base of tongue in preparation for swallow, at times in excess of 5 seconds, however swallow initiated either at or above the level of the vallecula with all solid and nectar thick liquid consistencies. With thin liquids however, swallow is delayed to the level of the pyriform sinuses which when combined with decreased laryngeal closure, results in deep silent penetration to the vocal cords. although occurring in only 25-30% of trials, cued cough weak and unsuccessful to clear hte airway resulting in a significant risk of post swallow aspiration. Given decreased functional reserve with acute illness, recommend dysphagia 3 solids with nectar thick liquids. Prognosis for ability to  advance diet good with improved medical status and conditioning. Will f/u.  SLP Visit Diagnosis Dysphagia, unspecified (R13.10) Attention and concentration deficit following -- Frontal lobe and executive function deficit following -- Impact on safety and function Moderate aspiration risk   CHL IP TREATMENT RECOMMENDATION 08/27/2017 Treatment Recommendations Therapy as outlined in treatment plan below   Prognosis 08/27/2017 Prognosis for Safe Diet Advancement Good Barriers to Reach Goals -- Barriers/Prognosis Comment -- CH[facility] IP DIET RECOMMENDATION 08/27/2017 SLP Diet Recommendations Dysphagia 3 (Mech soft) solids;Nectar thick liquid Liquid Administration via Cup;No straw Medication Administration Whole meds with puree Compensations Slow rate;Small sips/bites Postural Changes Seated upright at 90 degrees   CHL IP OTHER RECOMMENDATIONS 08/27/2017 Recommended Consults -- Oral Care Recommendations Oral care BID Other Recommendations Order thickener from pharmacy;Prohibited food (jello, ice cream, thin soups);Remove water pitcher   CH[facility] IP FOLLOW UP RECOMMENDATIONS 08/27/2017 Follow up Recommendations Skilled Nursing facility   [facility] IP FREQUENCY AND DURATION 08/27/2017 Speech Therapy Frequency (ACUTE ONLY) min 2x/week Treatment Duration 2 weeks      CHL IP ORAL PHASE 08/27/2017 Oral Phase Impaired Oral - Pudding Teaspoon -- Oral - Pudding Cup -- Oral - Honey Teaspoon -- Oral - Honey Cup -- Oral -  Nectar Teaspoon Delayed oral transit;Lingual pumping;Holding of bolus Oral - Nectar Cup Delayed oral transit;Lingual pumping;Holding of bolus Oral - Nectar Straw -- Oral - Thin Teaspoon -- Oral - Thin Cup Delayed oral transit;Lingual pumping;Holding of bolus Oral - Thin Straw Delayed oral transit;Lingual pumping;Holding of bolus Oral - Puree Delayed oral transit;Lingual pumping;Holding of bolus Oral - Mech Soft Delayed oral transit;Lingual pumping;Holding of bolus Oral - Regular -- Oral - Multi-Consistency -- Oral - Pill NT Oral Phase -  Comment --  CH[facility] IP PHARYNGEAL PHASE 08/27/2017 Pharyngeal Phase Impaired Pharyngeal- Pudding Teaspoon -- Pharyngeal -- Pharyngeal- Pudding Cup -- Pharyngeal -- Pharyngeal- Honey Teaspoon -- Pharyngeal -- Pharyngeal- Honey Cup -- Pharyngeal -- Pharyngeal- Nectar Teaspoon Delayed swallow initiation-vallecula;Reduced tongue base retraction;Pharyngeal residue - valleculae Pharyngeal -- Pharyngeal- Nectar Cup Delayed swallow initiation-vallecula;Reduced tongue base retraction;Pharyngeal residue - valleculae Pharyngeal -- Pharyngeal- Nectar Straw -- Pharyngeal -- Pharyngeal- Thin Teaspoon Delayed swallow initiation-pyriform sinuses;Reduced tongue base retraction;Pharyngeal residue - valleculae;Reduced anterior laryngeal mobility Pharyngeal -- Pharyngeal- Thin Cup Delayed swallow initiation-pyriform sinuses;Penetration/Aspiration during swallow;Reduced tongue base retraction;Pharyngeal residue - valleculae;Reduced anterior laryngeal mobility Pharyngeal Material enters airway, CONTACTS cords and not ejected out Pharyngeal- Thin Straw Delayed swallow initiation-pyriform sinuses;Reduced tongue base retraction;Reduced anterior laryngeal mobility;Penetration/Aspiration during swallow;Pharyngeal residue - valleculae Pharyngeal Material enters airway, CONTACTS cords and not ejected out Pharyngeal- Puree Delayed swallow initiation-vallecula;Reduced tongue base retraction;Pharyngeal residue - valleculae Pharyngeal -- Pharyngeal- Mechanical Soft Delayed swallow initiation-vallecula;Reduced tongue base retraction;Pharyngeal residue - valleculae Pharyngeal -- Pharyngeal- Regular -- Pharyngeal -- Pharyngeal- Multi-consistency -- Pharyngeal -- Pharyngeal- Pill NT Pharyngeal -- Pharyngeal Comment --  CHL IP CERVICAL ESOPHAGEAL PHASE 08/27/2017 Cervical Esophageal Phase WFL Pudding Teaspoon -- Pudding Cup -- Honey Teaspoon -- Honey Cup -- Nectar Teaspoon -- Nectar Cup -- Nectar Straw -- Thin Teaspoon -- Thin Cup -- Thin Straw -- Puree --  Mechanical Soft -- Regular -- Multi-consistency -- Pill -- Cervical Esophageal Comment -- CHL IP GO 03/28/2015 Functional Assessment Tool Used MBSS Functional Limitations Swallowing Swallow Current Status (A6301) CI Swallow Goal Status (S0109) CI Swallow Discharge Status (N2355) CI Motor Speech Current Status (D3220) (None) Motor Speech Goal Status (U5427) (None) Motor Speech Goal Status (C6237) (None) Spoken Language Comprehension Current Status (S2831) (None) Spoken Language Comprehension Goal Status (D1761) (None) Spoken Language Comprehension Discharge Status [phone]) (None) Spoken Language Expression Current Status (T0626) (None) Spoken Language Expression Goal Status (R4854) (None) Spoken Language Expression Discharge Status [phone]) (None) Attention Current Status (J0093) (None) Attention Goal Status (G1829) (None) Attention Discharge Status (H3716) (None) Memory Current Status (R6789) (None) Memory Goal Status (F8101) (None) Memory Discharge Status (B5102) (None) Voice Current Status (H8527) (None) Voice Goal Status (P8242) (None) Voice Discharge Status (P5361) (None) Other Speech-Language Pathology Functional Limitation Current Status (W4315) (None) Other Speech-Language Pathology Functional Limitation Goal Status (Q0086) (None) Other Speech-Language Pathology Functional Limitation Discharge Status [phone]) (None) Gabriel Rainwater MA, CCC-SLP [phone] McCoy Leah Meryl 08/27/2017, 1:55 PM               Assessment/Plan  #1-rash-this was evaluated by wound care nurse as well- appears to be fungal she will treat with Nizoral cream and will monitor  #2 history of coronary artery disease status post CABG this appears to be stable he does not complain of chest pain he is on statin and aspirin   #3 hypernatremia again this has improved it appears with better by mouth intake and cessation of vomiting will update labs early next week.  #4-atrial fibrillation at this point appears rate controlled on Lopressor  continues on Coumadin INR has been low but adjustments have been made as noted above with update INR pending for next week. He also continues on amiodarone  #5-history of chronic kidney disease this has improved also somewhat with the creatinine 2.37 BUN of 37 will update this next week as well I suspect better by mouth intake has had some improvement here.  [id number]

## 2017-09-17 NOTE — Therapy (Signed)
[facility] [place], [place], [address] Phone: [phone]   Fax:  [phone]  Modified Barium Swallow  Patient Details  Name: Stanley Robles MRN: [id number] Date of Birth: 12-Nov-1932 No Data Recorded  Encounter Date: 09/10/2017      End of Session - 09/10/17 1804    Visit Number 1   Number of Visits 1   Authorization Type [employer] Medicare   SLP Start Time 6962   SLP Stop Time  1410   SLP Time Calculation (min) 35 min   Activity Tolerance Patient tolerated treatment well      Past Medical History:  Diagnosis Date  . BPH (benign prostatic hypertrophy)   . Cancer ([facility])    CANCEROUS MOLE REMOVED FROM BACK - SEVERAL YRS AGO  . Chronic kidney disease stage 3   . Coronary artery disease    DR. TILLEY IS PT'S CARDIOLGIST  . GERD (gastroesophageal reflux disease)   . Gout   . Hyperlipidemia   . Hypertensive heart disease   . Incontinence of urine   . Memory loss   . Myocardial infarction ([facility]) 1994  . Osteoarthritis   . Paroxysmal atrial fibrillation ([facility])   . Peripheral vascular disease ([facility])   . Personal history of transient ischemic attack (TIA) and cerebral infarction without residual deficit   . Right pontine stroke ([facility]) 02/27/2015  . Tooth disease     Past Surgical History:  Procedure Laterality Date  . CARDIAC CATHETERIZATION  07/21/2005  . CORONARY ARTERY BYPASS GRAFT N/A 08/07/2017   Procedure: CORONARY ARTERY BYPASS GRAFTING (CABG) x 2 WITH ENDOSCOPIC HARVESTING OF RIGHT SAPHENOUS VEIN.;  Surgeon: Ivin Poot, MD;  Location: [facility];  Service: Open Heart Surgery;  Laterality: N/A;  . HERNIA REPAIR  12/26/2002   BILATERAL INGUINAL HERNIA REPAIR  . LEFT HEART CATH AND CORONARY ANGIOGRAPHY N/A 08/03/2017   Procedure: LEFT HEART CATH AND CORONARY ANGIOGRAPHY;  Surgeon: Sherren Mocha, MD;  Location: [facility];  Service: Cardiovascular;  Laterality: N/A;  . MASTOID SURGERY X 5    . PROSTATE SURGERY     . TEE WITHOUT CARDIOVERSION N/A 08/07/2017   Procedure: TRANSESOPHAGEAL ECHOCARDIOGRAM (TEE);  Surgeon: Prescott Gum, Collier Salina, MD;  Location: [facility];  Service: Open Heart Surgery;  Laterality: N/A;  . TOTAL KNEE ARTHROPLASTY Left 03/28/2013   Procedure: LEFT TOTAL KNEE ARTHROPLASTY;  Surgeon: Gearlean Alf, MD;  Location: [facility];  Service: Orthopedics;  Laterality: Left;    There were no vitals filed for this visit.      Subjective Assessment - 09/10/17 1741    Subjective "I don't like the thick liquids."   Patient is accompained by: Family member  daughter   Special Tests MBSS   Currently in Pain? No/denies           General - 09/10/17 1743      General Information   Date of Onset 07/31/17   HPI Pt is an 83 yo male who is currently at [facility] for rehab following a recent hospitalization for NSTEMI s/p cath which showed severe left main disease and total right coronary artery stenosis, had CABG 9/14. 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 Pt was evaluated by ST prior to CABG; MBS 08/05/17 revealed mild oropharyngeal and cervical esophageal dysphagia, suspected to be more chronic in nature given his h/o dysphagia from prior CVA and h/o GERD. Dys  3, thin liquids, no straws recommended with ST to follow briefly through surgery given his history. Pt had repeat MBSS s/p CABG on 08/27/2017 with recommendation for D3/NTL. He is here today for repeat MBSS as family is hopeful for upgrade to thin liquids.   Type of Study MBS-Modified Barium Swallow Study   Previous Swallow Assessment last MBSS on 08/27/2017 D3/NTL   Diet Prior to this Study Dysphagia 3 (soft);Nectar-thick liquids   Temperature Spikes Noted No   Respiratory Status Room air   History of Recent Intubation Yes   Date extubated 08/07/17   Behavior/Cognition Cooperative;Alert;Pleasant mood   Oral Care Completed by SLP No   Oral Cavity  - Dentition Adequate natural dentition   Vision Functional for self feeding   Self-Feeding Abilities Able to feed self   Patient Positioning Upright in chair   Baseline Vocal Quality Normal   Volitional Cough Strong   Volitional Swallow Able to elicit   Anatomy Within functional limits   Pharyngeal Secretions Not observed secondary MBS            Oral Preparation/Oral Phase - 09/10/17 1744      Oral Preparation/Oral Phase   Oral Phase Impaired     Oral - Solids   Oral - Regular Delayed A-P transit;Piecemeal swallowing;Imparied mastication     Electrical stimulation - Oral Phase   Was Electrical Stimulation Used No          Pharyngeal Phase - 09/10/17 1753      Pharyngeal Phase   Pharyngeal Phase Impaired     Pharyngeal - Nectar   Pharyngeal- Nectar Cup Swallow initiation at pyriform sinus;Reduced airway/laryngeal closure;Penetration/Aspiration during swallow   Pharyngeal Material does not enter airway;Material enters airway, remains ABOVE vocal cords then ejected out     Pharyngeal - Thin   Pharyngeal- Thin Teaspoon Swallow initiation at pyriform sinus   Pharyngeal- Thin Cup Swallow initiation at pyriform sinus;Penetration/Aspiration before swallow;Reduced airway/laryngeal closure;Moderate aspiration   Pharyngeal Material enters airway, passes BELOW cords and not ejected out despite cough attempt by patient;Material enters airway, CONTACTS cords and not ejected out     Pharyngeal - Solids   Pharyngeal- Puree Swallow initiation at vallecula   Pharyngeal- Regular Delayed swallow initiation-vallecula   Pharyngeal- Pill Swallow initiation at vallecula  presented in puree     Electrical Stimulation - Pharyngeal Phase   Was Electrical Stimulation Used No          Cricopharyngeal Phase - 09/10/17 1802      Cervical Esophageal Phase   Cervical Esophageal Phase Impaired     Cervical Esophageal Phase - Solids   Pill Prominent cricopharyngeal segment      Cervical Esophageal Phase - Comment   Cervical Esophageal Comment Prominet CP   Other Esophageal Phase Observations Stasis of barium tablet in mid esophagus, but cleared with sips of liquid (pill was taken with puree)           Plan - 09/10/17 1805    Clinical Impression Statement Pt presents with mild oral phase and moderate pharyngeal phase dysphagia characterized by impaired mastication and impaired lingual movement resulting in prolonged and inefficient oral phase specifically with solids; pharyngeal phase is marked by significant delay in swallow initiation with swallow trigger after filling the pyriforms with thins and nectars and decreased laryngeal vestibule closure resulting in sensed aspiration of thins before the swallow trigger and penetration of nectars and thins with cup sips. Pt was able to swallow tsp presentations of thins without penetration or  aspiration on 3/3 trials. Although Pt sensed aspiration of cup sip thin, he was not able to completely remove from laryngeal vestibule. Recommend D3/mech soft with NTL and trials thin via tsp presentation with SLP only at this time. Continue PO meds whole in puree. Consider repeat objective assessment in 3-4 weeks or when clinically appropriate per treating SLP.      Patient will benefit from skilled therapeutic intervention in order to improve the following deficits and impairments:   Dysphagia, oropharyngeal phase      G-Codes - 2017/09/11 1812    Functional Assessment Tool Used --   Functional Limitations --   Swallow Current Status (J1884) --   Swallow Goal Status (Z6606) --   Swallow Discharge Status [phone]) --          Recommendations/Treatment - 09-11-17 1804      Swallow Evaluation Recommendations   SLP Diet Recommendations Dysphagia 3 (mechanical soft);Nectar  trials tsp thin with SLP   Liquid Administration via Cup;No straw   Medication Administration Whole meds with puree   Supervision Patient able to self  feed;Full supervision/cueing for compensatory strategies   Compensations Small sips/bites   Postural Changes Seated upright at 90 degrees;Remain upright for at least 30 minutes after feeds/meals        Problem List Patient Active Problem List   Diagnosis Date Noted  . Pneumonia 09/07/2017  . Pressure injury of skin 08/18/2017  . AKI (acute kidney injury) ([facility])   . Cardiogenic shock ([facility])   . S/P CABG x 2 08/07/2017  . Thrombocytopenia ([facility])   . NSTEMI (non-ST elevated myocardial infarction) ([facility]) 07/31/2017  . BPH (benign prostatic hyperplasia) 04/03/2013  . CAD (coronary artery disease) 04/01/2013  . Paroxysmal atrial fibrillation ([facility]) 04/01/2013  . Hyperlipidemia   . Hypertensive heart disease   . Personal history of transient ischemic attack (TIA) and cerebral infarction without residual deficit   . Long-term (current) use of anticoagulants   . Chronic kidney disease stage 3    Thank you,  Genene Churn, [place]  [facility] 09/11/17, 6:13 PM  [facility] [address] [place], [place], [address] Phone: [phone]   Fax:  [phone]  Name: Fawzi Melman MRN: [id number] Date of Birth: Sep 10, 1932

## 2017-09-22 ENCOUNTER — Encounter (HOSPITAL_COMMUNITY)
Admission: RE | Admit: 2017-09-22 | Discharge: 2017-09-22 | Disposition: A | Discharge status: Home / Self Care | Payer: Medicare Other | Source: Skilled Nursing Facility | Attending: Internal Medicine | Admitting: Internal Medicine

## 2017-09-22 DIAGNOSIS — Z7901 Long term (current) use of anticoagulants: Secondary | ICD-10-CM | POA: Insufficient documentation

## 2017-09-22 DIAGNOSIS — D631 Anemia in chronic kidney disease: Secondary | ICD-10-CM | POA: Insufficient documentation

## 2017-09-22 DIAGNOSIS — I10 Essential (primary) hypertension: Secondary | ICD-10-CM | POA: Insufficient documentation

## 2017-09-22 DIAGNOSIS — Z48812 Encounter for surgical aftercare following surgery on the circulatory system: Secondary | ICD-10-CM | POA: Insufficient documentation

## 2017-09-22 DIAGNOSIS — I480 Paroxysmal atrial fibrillation: Secondary | ICD-10-CM | POA: Insufficient documentation

## 2017-09-22 DIAGNOSIS — E785 Hyperlipidemia, unspecified: Secondary | ICD-10-CM | POA: Insufficient documentation

## 2017-09-22 LAB — CBC WITH DIFFERENTIAL/PLATELET
BASOS ABS: 0 10*3/uL (ref 0.0–0.1)
Basophils Relative: 0 %
EOS ABS: 0.1 10*3/uL (ref 0.0–0.7)
Eosinophils Relative: 2 %
HCT: 30.7 % — ABNORMAL LOW (ref 39.0–52.0)
HEMOGLOBIN: 9.9 g/dL — AB (ref 13.0–17.0)
LYMPHS ABS: 1 10*3/uL (ref 0.7–4.0)
LYMPHS PCT: 26 %
MCH: 34.6 pg — AB (ref 26.0–34.0)
MCHC: 32.2 g/dL (ref 30.0–36.0)
MCV: 107.3 fL — AB (ref 78.0–100.0)
Monocytes Absolute: 0.3 10*3/uL (ref 0.1–1.0)
Monocytes Relative: 8 %
NEUTROS PCT: 63 %
Neutro Abs: 2.4 10*3/uL (ref 1.7–7.7)
Platelets: 111 10*3/uL — ABNORMAL LOW (ref 150–400)
RBC: 2.86 MIL/uL — AB (ref 4.22–5.81)
RDW: 16.4 % — ABNORMAL HIGH (ref 11.5–15.5)
WBC: 3.8 10*3/uL — AB (ref 4.0–10.5)

## 2017-09-22 LAB — BASIC METABOLIC PANEL
ANION GAP: 5 (ref 5–15)
BUN: 30 mg/dL — AB (ref 6–20)
CHLORIDE: 105 mmol/L (ref 101–111)
CO2: 26 mmol/L (ref 22–32)
Calcium: 8.7 mg/dL — ABNORMAL LOW (ref 8.9–10.3)
Creatinine, Ser: 2.01 mg/dL — ABNORMAL HIGH (ref 0.61–1.24)
GFR calc Af Amer: 34 mL/min — ABNORMAL LOW (ref >60)
GFR calc non Af Amer: 29 mL/min — ABNORMAL LOW (ref >60)
Glucose, Bld: 93 mg/dL (ref 65–99)
Potassium: 4.3 mmol/L (ref 3.5–5.1)
SODIUM: 136 mmol/L (ref 135–145)

## 2017-09-22 LAB — PROTIME-INR
INR: 2
Prothrombin Time: 22.5 seconds — ABNORMAL HIGH (ref 11.4–15.2)

## 2017-09-25 ENCOUNTER — Encounter (HOSPITAL_COMMUNITY)
Admission: RE | Admit: 2017-09-25 | Discharge: 2017-09-25 | Disposition: A | Discharge status: Home / Self Care | Payer: Medicare Other | Source: Skilled Nursing Facility | Attending: Internal Medicine | Admitting: Internal Medicine

## 2017-09-25 DIAGNOSIS — D631 Anemia in chronic kidney disease: Secondary | ICD-10-CM | POA: Insufficient documentation

## 2017-09-25 DIAGNOSIS — Z7901 Long term (current) use of anticoagulants: Secondary | ICD-10-CM | POA: Insufficient documentation

## 2017-09-25 DIAGNOSIS — E785 Hyperlipidemia, unspecified: Secondary | ICD-10-CM | POA: Insufficient documentation

## 2017-09-25 DIAGNOSIS — Z48812 Encounter for surgical aftercare following surgery on the circulatory system: Secondary | ICD-10-CM | POA: Insufficient documentation

## 2017-09-25 DIAGNOSIS — I10 Essential (primary) hypertension: Secondary | ICD-10-CM | POA: Insufficient documentation

## 2017-09-25 LAB — PROTIME-INR
INR: 2.1
PROTHROMBIN TIME: 23.4 s — AB (ref 11.4–15.2)

## 2017-09-28 ENCOUNTER — Non-Acute Institutional Stay (SKILLED_NURSING_FACILITY): Payer: Medicare Other | Admitting: Internal Medicine

## 2017-09-28 ENCOUNTER — Encounter: Payer: Self-pay | Admitting: Internal Medicine

## 2017-09-28 ENCOUNTER — Ambulatory Visit: Payer: Medicare Other

## 2017-09-28 DIAGNOSIS — Z951 Presence of aortocoronary bypass graft: Secondary | ICD-10-CM | POA: Diagnosis not present

## 2017-09-28 DIAGNOSIS — R2231 Localized swelling, mass and lump, right upper limb: Secondary | ICD-10-CM

## 2017-09-28 DIAGNOSIS — I480 Paroxysmal atrial fibrillation: Secondary | ICD-10-CM | POA: Diagnosis not present

## 2017-09-28 DIAGNOSIS — N179 Acute kidney failure, unspecified: Secondary | ICD-10-CM | POA: Diagnosis not present

## 2017-09-28 DIAGNOSIS — I2510 Atherosclerotic heart disease of native coronary artery without angina pectoris: Secondary | ICD-10-CM | POA: Diagnosis not present

## 2017-09-28 NOTE — Progress Notes (Addendum)
Location:   [facility] Room Number: 134/P Place of Service:  SNF (31) Provider:  Myra Rude, MD  Patient Care Team: Glenda Chroman, MD as PCP - General (Internal Medicine) Jacolyn Reedy, MD as Consulting Physician (Cardiology)  Extended Emergency Contact Information Primary Emergency Contact: [name], [place] [address] [name] Phone: [phone] Mobile Phone: [phone] Relation: Daughter Secondary Emergency Contact: [name] Address: [address] [place], [place] [address] [place] Phone: [phone] Relation: Daughter  Code Status:  Full Code Goals of care: Advanced Directive information Advanced Directives 09/17/2017  Does Patient Have a Medical Advance Directive? Yes  Type of Advance Directive (No Data)  Does patient want to make changes to medical advance directive? No - Patient declined  Copy of [place] in Chart? -  Would patient like information on creating a medical advance directive? No - Patient declined  Pre-existing out of facility DNR order (yellow form or pink MOST form) -     Chief Complaint  Patient presents with  . Acute Visit    Right HAND EDEMA    HPI:  Pt is a 83 y.o. male seen today for an acute visit for Right Hand Edema and Constipation.  Patient was admitted to SNF for therapy after undergoing CABG x2 on 08/08/2017.With Right leg Saphenous vein and also has Right Axillary cannulation. It was complicated by ARF requiring Hemodialysis, Aspiration pneumonia and Rapid Atrial fibrilation  While in facility patient had severe constipation with Nausea and vomiting. He also was treated for aspiration Pneumonia again. But overall he improved and has done well with therapy. His Nausea and vomiting resolved. His appetite improved. He is also walking with the walker around in facility. His weight improved from 155 to 162  lbs He is also off his oxygen . Today he was seen today due to swelling of his Right Hand. He denies any pain or injury to that hand. Its been there for few days. He also is c/o severe constipation with incomplete emptying.    Past Medical History:  Diagnosis Date  . BPH (benign prostatic hypertrophy)   . Cancer ([facility])    CANCEROUS MOLE REMOVED FROM BACK - SEVERAL YRS AGO  . Chronic kidney disease stage 3   . Coronary artery disease    DR. TILLEY IS PT'S CARDIOLGIST  . GERD (gastroesophageal reflux disease)   . Gout   . Hyperlipidemia   . Hypertensive heart disease   . Incontinence of urine   . Memory loss   . Myocardial infarction ([facility]) 1994  . Osteoarthritis   . Paroxysmal atrial fibrillation ([facility])   . Peripheral vascular disease ([facility])   . Personal history of transient ischemic attack (TIA) and cerebral infarction without residual deficit   . Right pontine stroke ([facility]) 02/27/2015  . Tooth disease    Past Surgical History:  Procedure Laterality Date  . CARDIAC CATHETERIZATION  07/21/2005  . HERNIA REPAIR  12/26/2002   BILATERAL INGUINAL HERNIA REPAIR  . MASTOID SURGERY X 5    . PROSTATE SURGERY      Allergies  Allergen Reactions  . Lipitor [Atorvastatin] Other (See Comments)    Severe constipation    Outpatient Encounter Medications as of 09/28/2017  Medication Sig  . acetaminophen (TYLENOL) 500 MG tablet Take 1,000 mg by mouth every 8 (eight) hours  as needed.  Marland Kitchen allopurinol (ZYLOPRIM) 300 MG tablet Take 0.5 tablets (150 mg total) by mouth daily.  Marland Kitchen amiodarone (PACERONE) 200 MG tablet Take 1 tablet (200 mg total) by mouth daily.  Marland Kitchen aspirin EC 81 MG EC tablet Take 1 tablet (81 mg total) by mouth daily.  . Cholecalciferol (VITAMIN D3) 2000 units TABS Take 2,000 Units by mouth daily.  Marland Kitchen docusate sodium (COLACE) 100 MG capsule Take 100 mg by mouth daily.  . finasteride (PROSCAR) 5 MG tablet Take 5 mg by mouth daily.  Marland Kitchen HYDROcodone-acetaminophen (NORCO/VICODIN) 5-325 MG  tablet Take 1 tablet by mouth every 6 (six) hours as needed for moderate pain.  Marland Kitchen ketoconazole (NIZORAL) 2 % cream Apply 1 application 2 (two) times daily topically. Apply to rash on bilateral lower posterior legs/ankles for 14 days stop date 10/01/2017  . linaclotide (LINZESS) 72 MCG capsule Take 72 mcg by mouth daily before breakfast.  . metoprolol tartrate (LOPRESSOR) 25 MG tablet Take 12.5 mg by mouth 2 (two) times daily.  Marland Kitchen omeprazole (PRILOSEC) 20 MG capsule Take 20 mg by mouth 2 (two) times daily before a meal.   . ondansetron (ZOFRAN) 4 MG tablet Take 4 mg by mouth every 8 (eight) hours as needed for nausea or vomiting.  . ondansetron (ZOFRAN) 4 MG tablet Take 4 mg by mouth every 6 (six) hours as needed for nausea or vomiting.  . polyethylene glycol (MIRALAX / GLYCOLAX) packet Take 17 g by mouth daily.  . rosuvastatin (CRESTOR) 10 MG tablet Take 1 tablet (10 mg total) by mouth daily.  . sodium phosphate (FLEET) 7-19 GM/118ML ENEM Place 1 enema daily as needed rectally for severe constipation.  Marland Kitchen warfarin (COUMADIN) 1 MG tablet Take 1 mg tablet by mouth once a day on Sun,Tue,Thu.  . warfarin (COUMADIN) 2 MG tablet Take 1 tablet 2 mg by mouth once a day on Mon,Wed, Fri, Sat  . [DISCONTINUED] feeding supplement, ENSURE ENLIVE, (ENSURE ENLIVE) LIQD Take 237 mLs by mouth 2 (two) times daily between meals.   No facility-administered encounter medications on file as of 09/28/2017.      Review of Systems  Constitutional: Negative.   HENT: Negative.   Respiratory: Negative.   Cardiovascular: Negative.   Gastrointestinal: Positive for constipation. Negative for abdominal pain, nausea and vomiting.  Genitourinary: Negative.   Musculoskeletal: Positive for back pain and joint swelling.  Skin: Negative.   Neurological: Negative.   Psychiatric/Behavioral: Negative.      There is no immunization history on file for this patient. Pertinent  Health Maintenance Due  Topic Date Due  . INFLUENZA  VACCINE  10/24/2017 (Originally 06/24/2017)  . PNA vac Low Risk Adult (1 of 2 - PCV13) 10/24/2017 (Originally 10/20/1999)   Fall Risk  10/27/2016 10/30/2015  Falls in the past year? No No   Functional Status Survey:    Vitals:   09/28/17 1042  BP: (!) 118/57  Pulse: 80  Resp: (!) 22  Temp: (!) 97.5 F (36.4 C)  TempSrc: Oral   There is no height or weight on file to calculate BMI. Physical Exam  Constitutional: He appears well-developed and well-nourished.  HENT:  Head: Normocephalic.  Mouth/Throat: Oropharynx is clear and moist.  Eyes: Pupils are equal, round, and reactive to light.  Neck: Neck supple.  Cardiovascular: Normal rate and normal heart sounds.  Pulmonary/Chest: Effort normal and breath sounds normal. No respiratory distress. He has no wheezes. He has no rales.  Abdominal: Soft. Bowel sounds are normal. He exhibits no  distension. There is no tenderness. There is no rebound.  Musculoskeletal: He exhibits no edema.  Has swelling in his right hand. Not tender or red. Swelling is more on the Dorsal side of hand going to fingers.  Neurological: He is alert.  Skin: Skin is warm and dry.  Psychiatric: He has a normal mood and affect. Thought content normal.    Labs reviewed: Recent Labs    08/17/17 0501  08/18/17 0355  08/19/17 0332  08/26/17 0504 08/27/17 0452 08/28/17 1620  09/09/17 0712 09/14/17 0700 09/22/17 0715  NA 133*   < > 131*   < > 130*   < > 138 142 146*   < > 145 142 136  K 3.9   < > 3.3*   < > 3.5   < > 3.6 3.7 4.2   < > 4.2 4.2 4.3  CL 101   < > 97*   < > 96*   < > 101 106 115*   < > 109 110 105  CO2 22   < > 21*  --  20*   < > 22 22 23    < > 27 25 26   GLUCOSE 126*   < > 117*   < > 103*   < > 107* 103* 113*   < > 108* 91 93  BUN 22*   < > 46*   < > 65*   < > 100* 100* 84*   < > 54* 37* 30*  CREATININE 1.42*   < > 2.73*   < > 3.77*   < > 5.17* 4.79* 3.90*   < > 3.41* 2.37* 2.01*  CALCIUM 7.5*   < > 8.0*  --  8.3*   < > 9.5 9.2 9.2   < > 9.0 8.6*  8.7*  MG 1.8  --  1.9  --  1.9  --   --   --   --   --   --   --   --   PHOS 2.0*   < > 5.4*  --  5.6*   < > 5.3* 4.8* 4.1  --   --   --   --    < > = values in this interval not displayed.   Recent Labs    09/06/17 0719 09/08/17 1045 09/09/17 0712  AST 16 19 15   ALT 16* 16* 16*  ALKPHOS 84 91 81  BILITOT 0.5 0.6 0.6  PROT 5.1* 5.8* 5.7*  ALBUMIN 2.2* 2.4* 2.2*   Recent Labs    09/09/17 0712 09/14/17 0700 09/22/17 0715  WBC 8.0 5.4 3.8*  NEUTROABS 6.3 4.1 2.4  HGB 10.5* 10.2* 9.9*  HCT 33.8* 31.6* 30.7*  MCV 110.1* 108.2* 107.3*  PLT 119* 95* 111*   Lab Results  Component Value Date   TSH 3.035 08/04/2017   Lab Results  Component Value Date   HGBA1C 5.3 08/04/2017   Lab Results  Component Value Date   CHOL 162 02/28/2015   HDL 31 (L) 02/28/2015   LDLCALC 102 (H) 02/28/2015   TRIG 104 08/17/2017   CHOLHDL 5.2 02/28/2015    Significant Diagnostic Results in last 30 days:  Dg Op Swallowing Func-medicare/speech Path  Result Date: 09/10/2017 CLINICAL DATA:  Dysphagia since open heart surgery in September 2018, history of prior stroke, coronary disease post MI and CABG, hypertension, atrial fibrillation EXAM: MODIFIED BARIUM SWALLOW TECHNIQUE: Different consistencies of barium were administered orally to the patient by the Speech Pathologist. Imaging of the pharynx  was performed in the lateral projection. FLUOROSCOPY TIME:  Fluoroscopy Time:  3 minutes 18 seconds Radiation Exposure Index (if provided by the fluoroscopic device): 34.6 mGy Number of Acquired Spot Images: multiple fluoroscopic screen captures COMPARISON:  08/05/2017 FINDINGS: Thin liquid- with sips by teaspoon, premature spillover to the vallecular and piriform sinuses is seen. Delayed initiation of swallow. No laryngeal penetration or aspiration. With self cup presentation, premature spillover and delayed initiation RIGHT again identified associated with laryngeal penetration and aspiration of contrast into  the proximal trachea. A weak spontaneous cough reflex was seen. With teaspoon presentation and prolonged holding, no laryngeal penetration or aspiration occurred. Minimal vallecular residual noted. Laryngeal penetration and aspiration were not identified on the prior exam. Nectar thick liquid- premature spillover by teaspoon and cup to vallecular. Delayed initiation. Laryngeal penetration occurred following cup presentation without gross aspiration Honey- not evaluated Pure- with applesauce consistency, slightly delayed initiation of swallow. No laryngeal penetration or aspiration Cracker-delayed initiation without laryngeal penetration or aspiration Pure with cracker- not evaluated Barium tablet -  within normal limits IMPRESSION: Swallowing dysfunction as above. Please refer to the Speech Pathologists report for complete details and recommendations. Electronically Signed   By: Lavonia Dana M.D.   On: 09/10/2017 15:28    Assessment/Plan Right hand swelling  Will get the xray of the hand. D/w the nurse for elevation of that hand. Patient does have h/o Gout but it does not look inflamed. He is on Allopurinal S/P CABG Patient doing well. Continue therapy and follow up with CVTS.   Acute on Chronic renal failure Patient BUN is improving.and is now near his baseline. Follow up with Nephrology. He has told Nephrology many times now that he is not going to do dialysis.    Atrial fibrillation  Continue on Diltiazem and Amiodarone.  On Coumadin t be slowly adjusted to INR above 2 To d/w CVTS if it is stable to start him on Eliquis.as patient was on this before and family does not want him on Coumadin.  Hypernatremia Resolved. Doing well with PO intake  CAD with Hyperlipidemia Is on Statin and aspirin  Anemia Post op and Chronic renal disease. Got Aranesp 60  . Hgb Stable  Constipation Will increase the dose oh Linzess. Increase Miralax to BID. Enema also given   BPH Continue  finestride   Family/ staff Communication:   Labs/tests ordered:

## 2017-09-30 ENCOUNTER — Other Ambulatory Visit: Payer: Self-pay | Admitting: Cardiothoracic Surgery

## 2017-09-30 ENCOUNTER — Other Ambulatory Visit: Payer: Self-pay

## 2017-09-30 ENCOUNTER — Ambulatory Visit (INDEPENDENT_AMBULATORY_CARE_PROVIDER_SITE_OTHER): Payer: Self-pay | Admitting: Cardiothoracic Surgery

## 2017-09-30 ENCOUNTER — Inpatient Hospital Stay
Admit: 2017-09-30 | Discharge: 2017-09-30 | Disposition: A | Discharge status: Home / Self Care | Payer: Medicare Other | Attending: Cardiothoracic Surgery | Admitting: Cardiothoracic Surgery

## 2017-09-30 ENCOUNTER — Encounter: Payer: Self-pay | Admitting: Cardiothoracic Surgery

## 2017-09-30 ENCOUNTER — Ambulatory Visit: Payer: Medicare Other | Admitting: Cardiothoracic Surgery

## 2017-09-30 VITALS — BP 105/52 | HR 85 | Ht 67.0 in | Wt 161.0 lb

## 2017-09-30 DIAGNOSIS — Z951 Presence of aortocoronary bypass graft: Secondary | ICD-10-CM

## 2017-09-30 DIAGNOSIS — I480 Paroxysmal atrial fibrillation: Secondary | ICD-10-CM

## 2017-09-30 NOTE — Progress Notes (Signed)
PCP is Glenda Chroman, MD Referring Provider is Sherren Mocha, MD  Chief Complaint  Patient presents with  . Routine Post Op    CABG x 2, 08/08/2017    HPI: Postop visit for this 83 year old frail gentleman who underwent urgent 2 vessel CABG for severe left main stenosis and unstable angina.  The patient had a long postoperative course with problems including rapid atrial fibrillation, renal insufficiency, deconditioning, swallow disorder, and was discharged to the [facility] in [place].  He has been in the skilled nursing facility for almost 9 weeks and is progressed to be discharged in 2 days.  Patient has been on amiodarone postoperatively can now be stopped.  He is in sinus rhythm today.  He will transition from oral Coumadin to his usual dose of preoperative Eliquis when he returns home next week.  Today's chest x-ray is clear. Incisions are healing well. He has minimal peripheral edema  Past Medical History:  Diagnosis Date  . BPH (benign prostatic hypertrophy)   . 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 Chronic kidney disease stage 3   . Coronary artery disease    DR. TILLEY IS PT'S CARDIOLGIST  . GERD (gastroesophageal reflux disease)   . Gout   . Hyperlipidemia   . Hypertensive heart disease   . Incontinence of urine   . Memory loss   . Myocardial infarction ([place]) 1994  . Osteoarthritis   . Paroxysmal atrial fibrillation (HCC)   . Peripheral vascular disease ([place])   . Personal history of transient ischemic attack (TIA) and cerebral infarction without residual deficit   . Right pontine stroke ([place]) 02/27/2015  . Tooth disease     Past Surgical History:  Procedure Laterality Date  . CARDIAC CATHETERIZATION  07/21/2005  . HERNIA REPAIR  12/26/2002   BILATERAL INGUINAL HERNIA REPAIR  . MASTOID SURGERY X 5    . PROSTATE SURGERY      Family History  Problem Relation Age of Onset  . Hypertension Father   . Hypertension  Mother   . Hyperlipidemia Mother   . Cancer Brother     Social History Social History   Tobacco Use  . Smoking status: Former Research scientist (life sciences)  . Smokeless tobacco: Never Used  . Tobacco comment: QUIT SMOKING PRIOR TO 1980  Substance Use Topics  . Alcohol use: No  . Drug use: No    No current outpatient medications on file.   No current facility-administered medications for this visit.     Allergies  Allergen Reactions  . Lipitor [Atorvastatin] Other (See Comments)    Severe constipation    Review of Systems  Problems with appetite and general weakness are improving Problems with constipation probably related to narcotic Acacian which has been stopped  BP (!) 105/52   Pulse 85   Ht 5\' 7"  (1.702 m)   Wt 161 lb (73 kg)   SpO2 99%   BMI 25.22 kg/m  Physical Exam      Exam    General- alert and comfortable   Lungs- clear without rales, wheezes   Cor- regular rate and rhythm, no murmur , gallop   Abdomen- soft, non-tender   Extremities - warm, non-tender, minimal edema   Neuro- oriented, appropriate, no focal weakness   Diagnostic Tests: Chest x-ray images performed today personally reviewed and show clear lung fields, stable cardiac silhouette  Impression: Slow but progressive recovery after CABG for severe left main stenosis  Plan: Patient ready to transition to home He will return for a follow-up visit in 4 weeks to review progress. I discussed stopping his amiodarone with his daughter and transitioning from Coumadin to Eliquis.  He will have family helping him at home. He would appear to benefit from home physical therapy as well.  Len Childs, MD [facility] [phone]

## 2017-10-01 ENCOUNTER — Encounter: Payer: Self-pay | Admitting: Internal Medicine

## 2017-10-01 ENCOUNTER — Encounter (HOSPITAL_COMMUNITY)
Admission: RE | Admit: 2017-10-01 | Discharge: 2017-10-01 | Disposition: A | Discharge status: Home / Self Care | Payer: Medicare Other | Source: Skilled Nursing Facility | Attending: Internal Medicine | Admitting: Internal Medicine

## 2017-10-01 ENCOUNTER — Non-Acute Institutional Stay (SKILLED_NURSING_FACILITY): Payer: Medicare Other | Admitting: Internal Medicine

## 2017-10-01 DIAGNOSIS — Z951 Presence of aortocoronary bypass graft: Secondary | ICD-10-CM

## 2017-10-01 DIAGNOSIS — Z48812 Encounter for surgical aftercare following surgery on the circulatory system: Secondary | ICD-10-CM | POA: Insufficient documentation

## 2017-10-01 DIAGNOSIS — N179 Acute kidney failure, unspecified: Secondary | ICD-10-CM | POA: Diagnosis not present

## 2017-10-01 DIAGNOSIS — N183 Chronic kidney disease, stage 3 unspecified: Secondary | ICD-10-CM

## 2017-10-01 DIAGNOSIS — I480 Paroxysmal atrial fibrillation: Secondary | ICD-10-CM

## 2017-10-01 DIAGNOSIS — R2232 Localized swelling, mass and lump, left upper limb: Secondary | ICD-10-CM | POA: Insufficient documentation

## 2017-10-01 DIAGNOSIS — I10 Essential (primary) hypertension: Secondary | ICD-10-CM | POA: Insufficient documentation

## 2017-10-01 DIAGNOSIS — R2231 Localized swelling, mass and lump, right upper limb: Secondary | ICD-10-CM | POA: Insufficient documentation

## 2017-10-01 DIAGNOSIS — R278 Other lack of coordination: Secondary | ICD-10-CM | POA: Insufficient documentation

## 2017-10-01 LAB — BASIC METABOLIC PANEL
Anion gap: 9 (ref 5–15)
BUN: 32 mg/dL — AB (ref 6–20)
CHLORIDE: 103 mmol/L (ref 101–111)
CO2: 24 mmol/L (ref 22–32)
Calcium: 8.7 mg/dL — ABNORMAL LOW (ref 8.9–10.3)
Creatinine, Ser: 2.3 mg/dL — ABNORMAL HIGH (ref 0.61–1.24)
GFR calc Af Amer: 29 mL/min — ABNORMAL LOW (ref >60)
GFR, EST NON AFRICAN AMERICAN: 25 mL/min — AB (ref >60)
GLUCOSE: 93 mg/dL (ref 65–99)
POTASSIUM: 4 mmol/L (ref 3.5–5.1)
SODIUM: 136 mmol/L (ref 135–145)

## 2017-10-01 LAB — CBC
HCT: 28.5 % — ABNORMAL LOW (ref 39.0–52.0)
HEMOGLOBIN: 9.5 g/dL — AB (ref 13.0–17.0)
MCH: 35.3 pg — AB (ref 26.0–34.0)
MCHC: 33.3 g/dL (ref 30.0–36.0)
MCV: 105.9 fL — AB (ref 78.0–100.0)
PLATELETS: 105 10*3/uL — AB (ref 150–400)
RBC: 2.69 MIL/uL — AB (ref 4.22–5.81)
RDW: 16.2 % — ABNORMAL HIGH (ref 11.5–15.5)
WBC: 4 10*3/uL (ref 4.0–10.5)

## 2017-10-01 LAB — PROTIME-INR
INR: 2.56
PROTHROMBIN TIME: 27.3 s — AB (ref 11.4–15.2)

## 2017-10-01 NOTE — Progress Notes (Signed)
Location:   [place] Room Number: 539/J Place of Service:  SNF (31)  Provider: Veleta Miners  PCP: Glenda Chroman, MD Patient Care Team: Glenda Chroman, MD as PCP - General (Internal Medicine) Jacolyn Reedy, MD as Consulting Physician (Cardiology)  Extended Emergency Contact Information Primary Emergency Contact: [name], [place] [address] [name] of [place] Phone: [phone] Mobile Phone: [phone] Relation: Daughter Secondary Emergency Contact: [name] Address: [address] [place], [place] [address] [place] Phone: [phone] Relation: Daughter  Code Status: Full Code Goals of care:  Advanced Directive information Advanced Directives 10/01/2017  Does Patient Have a Medical Advance Directive? Yes  Type of Advance Directive (No Data)  Does patient want to make changes to medical advance directive? No - Patient declined  Copy of [place] in Chart? -  Would patient like information on creating a medical advance directive? No - Patient declined  Pre-existing out of facility DNR order (yellow form or pink MOST form) -     Allergies  Allergen Reactions  . Lipitor [Atorvastatin] Other (See Comments)    Severe constipation    Chief Complaint  Patient presents with  . Discharge Note    Discharge Visit    HPI:  83 y.o. male  Seen today for discharge home.   Patient was admitted to SNF for therapy after undergoing CABG x2 on 08/08/2017.With Right leg Saphenous vein and also has Right Axillary cannulation. It was complicated by ARF requiring Hemodialysis, Aspiration pneumonia and Rapid Atrial fibrillation Patient also has h/o Atrial fibrillation Was on Chronic Eliquis, Right Pontine Stroke in 2016, Significant hearing loss, Dysphagia.Hypertension, BPH And PVD   While in facility patient had severe constipation with Nausea and vomiting. He also was treated for  aspiration Pneumonia again.  But then patient did very well with therapy. He eventually started walking with walker. His renal function stabilized with Creat of 2.3 GFR of 25. He was also taken off Amiodarone by Dr Darcey Nora  as he is now in Regular Rhythm. His coumadin is also changed to Eliquis starting tomorrow when he goes home. His Chest Xray in their office was clear. He continues on Modified diet of Mechanical soft with Nectar liquid for his dysphagia. He denies any SOB . There was some concern by Therapy about him having Low POX but mostly it has been above 90 % Patient is going home by himself but has daughter and they are going to hire help for him. He was seen today and did not have any new Complains. His Bowels are now moving good.   Past Medical History:  Diagnosis Date  . BPH (benign prostatic hypertrophy)   . 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 Chronic kidney disease stage 3   . Coronary artery disease    DR. TILLEY IS PT'S CARDIOLGIST  . GERD (gastroesophageal reflux disease)   . Gout   . Hyperlipidemia   . Hypertensive heart disease   . Incontinence of urine   . Memory loss   . Myocardial infarction ([place]) 1994  . Osteoarthritis   . Paroxysmal atrial fibrillation (HCC)   . Peripheral vascular disease ([place])   . Personal history of transient ischemic attack (TIA) and cerebral infarction without residual deficit   . Right pontine stroke ([place]) 02/27/2015  . Tooth  disease     Past Surgical History:  Procedure Laterality Date  . CARDIAC CATHETERIZATION  07/21/2005  . HERNIA REPAIR  12/26/2002   BILATERAL INGUINAL HERNIA REPAIR  . MASTOID SURGERY X 5    . PROSTATE SURGERY        reports that he has quit smoking. he has never used smokeless tobacco. He reports that he does not drink alcohol or use drugs. Social History   Socioeconomic History  . Marital status: Widowed    Spouse name: Not on file  . Number of children: 3  . Years  of education: 45  . Highest education level: Not on file  Social Needs  . Financial resource strain: Not on file  . Food insecurity - worry: Not on file  . Food insecurity - inability: Not on file  . Transportation needs - medical: Not on file  . Transportation needs - non-medical: Not on file  Occupational History  . Occupation: retired    Comment: Regulatory affairs officer  Tobacco Use  . Smoking status: Former Research scientist (life sciences)  . Smokeless tobacco: Never Used  . Tobacco comment: QUIT SMOKING PRIOR TO 1980  Substance and Sexual Activity  . Alcohol use: No  . Drug use: No  . Sexual activity: Not on file  Other Topics Concern  . Not on file  Social History Narrative   Retired Academic librarian and body.  Widower. Has sig other   Right handed   Caffeine use - coffee 1 cup daily   Functional Status Survey:    Allergies  Allergen Reactions  . Lipitor [Atorvastatin] Other (See Comments)    Severe constipation    Pertinent  Health Maintenance Due  Topic Date Due  . INFLUENZA VACCINE  10/24/2017 (Originally 06/24/2017)  . PNA vac Low Risk Adult (1 of 2 - PCV13) 10/24/2017 (Originally 10/20/1999)    Medications: Outpatient Encounter Medications as of 10/01/2017  Medication Sig  . acetaminophen (TYLENOL) 500 MG tablet Take 1,000 mg by mouth every 8 (eight) hours as needed.  Marland Kitchen allopurinol (ZYLOPRIM) 300 MG tablet Take 0.5 tablets (150 mg total) by mouth daily.  Marland Kitchen apixaban (ELIQUIS) 2.5 MG TABS tablet Take 2.5 mg 2 (two) times daily by mouth.  Marland Kitchen aspirin EC 81 MG EC tablet Take 1 tablet (81 mg total) by mouth daily.  . Cholecalciferol (VITAMIN D3) 2000 units TABS Take 2,000 Units by mouth daily.  Marland Kitchen docusate sodium (COLACE) 100 MG capsule Take 100 mg by mouth daily.  . finasteride (PROSCAR) 5 MG tablet Take 5 mg by mouth daily.  Marland Kitchen HYDROcodone-acetaminophen (NORCO/VICODIN) 5-325 MG tablet Take 1 tablet by mouth every 6 (six) hours as needed for moderate pain.  Marland Kitchen ketoconazole (NIZORAL) 2 % cream Apply 1  application 2 (two) times daily topically. Apply to rash on bilateral lower posterior legs/ankles for 14 days stop date 10/01/2017  . linaclotide (LINZESS) 145 MCG CAPS capsule Take 145 mcg daily before breakfast by mouth.  . metoprolol tartrate (LOPRESSOR) 25 MG tablet Take 12.5 mg by mouth 2 (two) times daily.  Marland Kitchen omeprazole (PRILOSEC) 20 MG capsule Take 20 mg by mouth 2 (two) times daily before a meal.   . ondansetron (ZOFRAN) 4 MG tablet Take 4 mg by mouth every 8 (eight) hours as needed for nausea or vomiting.  . ondansetron (ZOFRAN) 4 MG tablet Take 4 mg by mouth every 6 (six) hours as needed for nausea or vomiting.  . polyethylene glycol (MIRALAX / GLYCOLAX) packet Take 17 g by mouth daily.  Marland Kitchen  rosuvastatin (CRESTOR) 10 MG tablet Take 1 tablet (10 mg total) by mouth daily.  . sodium phosphate (FLEET) 7-19 GM/118ML ENEM Place 1 enema daily as needed rectally for severe constipation.  Marland Kitchen warfarin (COUMADIN) 1 MG tablet Take 1 mg tablet by mouth once a day on Sun,Tue,Thu.  . [DISCONTINUED] amiodarone (PACERONE) 200 MG tablet Take 1 tablet (200 mg total) by mouth daily.  . [DISCONTINUED] linaclotide (LINZESS) 72 MCG capsule Take 72 mcg by mouth daily before breakfast.  . [DISCONTINUED] warfarin (COUMADIN) 2 MG tablet Take 1 tablet 2 mg by mouth once a day on Mon,Wed, Fri, Sat   No facility-administered encounter medications on file as of 10/01/2017.      Review of Systems  There were no vitals filed for this visit. There is no height or weight on file to calculate BMI. Physical Exam  Constitutional: He appears well-developed and well-nourished.  HENT:  Head: Normocephalic.  Mouth/Throat: Oropharynx is clear and moist.  Eyes: Pupils are equal, round, and reactive to light.  Neck: Neck supple.  Cardiovascular: Normal rate and normal heart sounds.  Pulmonary/Chest: Effort normal and breath sounds normal. No respiratory distress. He has no wheezes. He has no rales.  Abdominal: Soft. Bowel  sounds are normal. He exhibits no distension. There is no tenderness. There is no rebound.  Musculoskeletal:  Trace edema Right more then Left Swelling in right hand is almost resolved  Neurological: He is alert.  Skin: Skin is warm and dry.  Psychiatric: He has a normal mood and affect. His behavior is normal.    Labs reviewed: Basic Metabolic Panel: Recent Labs    08/17/17 0501  08/18/17 0355  08/19/17 0332  08/26/17 0504 08/27/17 0452 08/28/17 1620  09/14/17 0700 09/22/17 0715 10/01/17 0700  NA 133*   < > 131*   < > 130*   < > 138 142 146*   < > 142 136 136  K 3.9   < > 3.3*   < > 3.5   < > 3.6 3.7 4.2   < > 4.2 4.3 4.0  CL 101   < > 97*   < > 96*   < > 101 106 115*   < > 110 105 103  CO2 22   < > 21*  --  20*   < > 22 22 23    < > 25 26 24   GLUCOSE 126*   < > 117*   < > 103*   < > 107* 103* 113*   < > 91 93 93  BUN 22*   < > 46*   < > 65*   < > 100* 100* 84*   < > 37* 30* 32*  CREATININE 1.42*   < > 2.73*   < > 3.77*   < > 5.17* 4.79* 3.90*   < > 2.37* 2.01* 2.30*  CALCIUM 7.5*   < > 8.0*  --  8.3*   < > 9.5 9.2 9.2   < > 8.6* 8.7* 8.7*  MG 1.8  --  1.9  --  1.9  --   --   --   --   --   --   --   --   PHOS 2.0*   < > 5.4*  --  5.6*   < > 5.3* 4.8* 4.1  --   --   --   --    < > = values in this interval not displayed.   Liver Function Tests: Recent Labs  09/06/17 0719 09/08/17 1045 09/09/17 0712  AST 16 19 15   ALT 16* 16* 16*  ALKPHOS 84 91 81  BILITOT 0.5 0.6 0.6  PROT 5.1* 5.8* 5.7*  ALBUMIN 2.2* 2.4* 2.2*   Recent Labs    09/09/17 0712  LIPASE 26   No results for input(s): AMMONIA in the last 8760 hours. CBC: Recent Labs    09/09/17 0712 09/14/17 0700 09/22/17 0715 10/01/17 0700  WBC 8.0 5.4 3.8* 4.0  NEUTROABS 6.3 4.1 2.4  --   HGB 10.5* 10.2* 9.9* 9.5*  HCT 33.8* 31.6* 30.7* 28.5*  MCV 110.1* 108.2* 107.3* 105.9*  PLT 119* 95* 111* 105*   Cardiac Enzymes: Recent Labs    08/01/17 0057 08/01/17 0609 08/01/17 1215  TROPONINI 10.85*  11.90* 9.78*   BNP: Invalid input(s): POCBNP CBG: Recent Labs    08/22/17 1556 08/22/17 2116 08/23/17 0806  GLUCAP 100* 113* 121*    Procedures and Imaging Studies During Stay: Dg Chest 2 View  Result Date: 09/30/2017 CLINICAL DATA:  Status post CABG on August 07, 2017. No current complaints. EXAM: CHEST  2 VIEW COMPARISON:  Chest x-ray of August 26, 2017 FINDINGS: The right lung is well-expanded and clear. On the left there has been clearing of the retrocardiac atelectasis with only minimal residual density noted. There is a trace of blunting of the left lateral costophrenic angle that process. The heart and there is calcification in the wall of the aortic arch. The sternal wires are intact. The retrosternal soft tissues appear normal. There is mild multilevel degenerative disc disease of the thoracic spine. Pulmonary vascularity are normal. IMPRESSION: Further interval clearing of retrocardiac atelectasis on the left. Persistent trace left pleural effusion. No pulmonary edema. Electronically Signed   By: David  Martinique M.D.   On: 09/30/2017 16:14   Dg Op Swallowing Func-medicare/speech Path  Result Date: 09/10/2017 CLINICAL DATA:  Dysphagia since open heart surgery in September 2018, history of prior stroke, coronary disease post MI and CABG, hypertension, atrial fibrillation EXAM: MODIFIED BARIUM SWALLOW TECHNIQUE: Different consistencies of barium were administered orally to the patient by the Speech Pathologist. Imaging of the pharynx was performed in the lateral projection. FLUOROSCOPY TIME:  Fluoroscopy Time:  3 minutes 18 seconds Radiation Exposure Index (if provided by the fluoroscopic device): 34.6 mGy Number of Acquired Spot Images: multiple fluoroscopic screen captures COMPARISON:  08/05/2017 FINDINGS: Thin liquid- with sips by teaspoon, premature spillover to the vallecular and piriform sinuses is seen. Delayed initiation of swallow. No laryngeal penetration or aspiration. With  self cup presentation, premature spillover and delayed initiation RIGHT again identified associated with laryngeal penetration and aspiration of contrast into the proximal trachea. A weak spontaneous cough reflex was seen. With teaspoon presentation and prolonged holding, no laryngeal penetration or aspiration occurred. Minimal vallecular residual noted. Laryngeal penetration and aspiration were not identified on the prior exam. Nectar thick liquid- premature spillover by teaspoon and cup to vallecular. Delayed initiation. Laryngeal penetration occurred following cup presentation without gross aspiration Honey- not evaluated Pure- with applesauce consistency, slightly delayed initiation of swallow. No laryngeal penetration or aspiration Cracker-delayed initiation without laryngeal penetration or aspiration Pure with cracker- not evaluated Barium tablet -  within normal limits IMPRESSION: Swallowing dysfunction as above. Please refer to the Speech Pathologists report for complete details and recommendations. Electronically Signed   By: Lavonia Dana M.D.   On: 09/10/2017 15:28    Assessment/Plan:   S/P CABG Has done well. Surgical scar healed. Will follow with Surgeon as outpatient  Acute on Chronic renal failure Creat back to baseline. Follow up with Nephrology. Plan for BMP in 1 week to follow renal function  Paroxysmal Atrial fibrillation On low dose metoprolol. Restart on Eliquis Coumadin Discontinue  CAD with Hyperlipidemia Is on Statin and aspirin Anemia Post op and Chronic renal disease. On iron supplement Follow up CBC Dysphagia On Mechanical soft diet Constipation On Miralax and Linzess Nausea and Vomiting Resolved Zofran discontinued. Continue Prilosec. BPH Continue finestride Gout On Allopurinal  Discharge Home with his daughters. Future labs/tests needed:  BMP, CBC in 1 week Patient needs to follow with Cardio vascular and Nephrology.  Discharge > Then 30  min.

## 2017-10-28 ENCOUNTER — Other Ambulatory Visit: Payer: Self-pay

## 2017-10-28 ENCOUNTER — Encounter: Payer: Self-pay | Admitting: Cardiothoracic Surgery

## 2017-10-28 ENCOUNTER — Ambulatory Visit (INDEPENDENT_AMBULATORY_CARE_PROVIDER_SITE_OTHER): Payer: Self-pay | Admitting: Cardiothoracic Surgery

## 2017-10-28 VITALS — BP 168/73 | HR 87 | Resp 18 | Ht 67.0 in

## 2017-10-28 DIAGNOSIS — I10 Essential (primary) hypertension: Secondary | ICD-10-CM

## 2017-10-28 DIAGNOSIS — Z951 Presence of aortocoronary bypass graft: Secondary | ICD-10-CM

## 2017-10-28 NOTE — Progress Notes (Signed)
PCP is Glenda Chroman, MD Referring Provider is Sherren Mocha, MD  Chief Complaint  Patient presents with  . Routine Post Op    s/p CABG 08/08/2017    HPI: Final 2 month postop office visit after urgent CABG 2 for left main stenosis and non-ST elevation MI Patient continues to progress and is living fairly independently with assistance from his daughters. No hospitalizations since last visit. No falls. He is gaining weight He denies angina Surgical incisions are healing well Minimal edema His blood pressure has been elevated and his beta blocker dose will be increased   Past Medical History:  Diagnosis Date  . BPH (benign prostatic hypertrophy)   . 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 Chronic kidney disease stage 3   . Coronary artery disease    DR. TILLEY IS PT'S CARDIOLGIST  . GERD (gastroesophageal reflux disease)   . Gout   . Hyperlipidemia   . Hypertensive heart disease   . Incontinence of urine   . Memory loss   . Myocardial infarction ([place]) 1994  . Osteoarthritis   . Paroxysmal atrial fibrillation (HCC)   . Peripheral vascular disease ([place])   . Personal history of transient ischemic attack (TIA) and cerebral infarction without residual deficit   . Right pontine stroke ([place]) 02/27/2015  . Tooth disease     Past Surgical History:  Procedure Laterality Date  . CARDIAC CATHETERIZATION  07/21/2005  . CORONARY ARTERY BYPASS GRAFT N/A 08/07/2017   Procedure: CORONARY ARTERY BYPASS GRAFTING (CABG) x 2 WITH ENDOSCOPIC HARVESTING OF RIGHT SAPHENOUS VEIN.;  Surgeon: Ivin Poot, MD;  Location: [facility];  Service: Open Heart Surgery;  Laterality: N/A;  . HERNIA REPAIR  12/26/2002   BILATERAL INGUINAL HERNIA REPAIR  . LEFT HEART CATH AND CORONARY ANGIOGRAPHY N/A 08/03/2017   Procedure: LEFT HEART CATH AND CORONARY ANGIOGRAPHY;  Surgeon: Sherren Mocha, MD;  Location: [facility];  Service: Cardiovascular;  Laterality: N/A;  . MASTOID  SURGERY X 5    . PROSTATE SURGERY    . TEE WITHOUT CARDIOVERSION N/A 08/07/2017   Procedure: TRANSESOPHAGEAL ECHOCARDIOGRAM (TEE);  Surgeon: Prescott Gum, Collier Salina, MD;  Location: [facility];  Service: Open Heart Surgery;  Laterality: N/A;  . TOTAL KNEE ARTHROPLASTY Left 03/28/2013   Procedure: LEFT TOTAL KNEE ARTHROPLASTY;  Surgeon: Gearlean Alf, MD;  Location: [facility];  Service: Orthopedics;  Laterality: Left;    Family History  Problem Relation Age of Onset  . Hypertension Father   . Hypertension Mother   . Hyperlipidemia Mother   . Cancer Brother     Social History Social History   Tobacco Use  . Smoking status: Former Research scientist (life sciences)  . Smokeless tobacco: Never Used  . Tobacco comment: QUIT SMOKING PRIOR TO 1980  Substance Use Topics  . Alcohol use: No  . Drug use: No    Current Outpatient Medications  Medication Sig Dispense Refill  . acetaminophen (TYLENOL) 500 MG tablet Take 1,000 mg by mouth every 8 (eight) hours as needed.    Marland Kitchen allopurinol (ZYLOPRIM) 300 MG tablet Take 0.5 tablets (150 mg total) by mouth daily.    Marland Kitchen apixaban (ELIQUIS) 2.5 MG TABS tablet Take 2.5 mg 2 (two) times daily by mouth.    Marland Kitchen aspirin EC 81 MG EC tablet Take 1 tablet (81 mg total) by mouth daily.    . Cholecalciferol (VITAMIN D3) 2000 units TABS Take 2,000 Units by mouth daily.    Marland Kitchen  docusate sodium (COLACE) 100 MG capsule Take 100 mg by mouth daily.    . finasteride (PROSCAR) 5 MG tablet Take 5 mg by mouth daily.    Marland Kitchen ketoconazole (NIZORAL) 2 % cream Apply 1 application 2 (two) times daily topically. Apply to rash on bilateral lower posterior legs/ankles for 14 days stop date 10/01/2017    . metoprolol tartrate (LOPRESSOR) 25 MG tablet Take 12.5 mg by mouth 2 (two) times daily.    Marland Kitchen omeprazole (PRILOSEC) 20 MG capsule Take 20 mg by mouth daily.     . polyethylene glycol (MIRALAX / GLYCOLAX) packet Take 17 g by mouth daily as needed.     . rosuvastatin (CRESTOR) 10 MG tablet Take 1 tablet (10 mg total) by mouth  daily.    . sodium phosphate (FLEET) 7-19 GM/118ML ENEM Place 1 enema daily as needed rectally for severe constipation.     No current facility-administered medications for this visit.     Allergies  Allergen Reactions  . Lipitor [Atorvastatin] Other (See Comments)    Severe constipation    Review of Systems  No bleeding problems from his Eliquis Difficulty sleeping No symptoms of TIA Minimal ankle edema Ambulates now with a cane instead of a walker Remains very engaged with his family  BP (!) 168/73 (BP Location: Right Arm, Patient Position: Sitting, Cuff Size: Normal)   Pulse 87   Resp 18   Ht 5\' 7"  (1.702 m)   SpO2 97% Comment: on RA  BMI 25.22 kg/m  Physical Exam      Exam    General- alert and comfortable   Lungs- clear without rales, wheezes   Cor- regular rate and rhythm, no murmur , gallop   Abdomen- soft, non-tender   Extremities - warm, non-tender, minimal edema   Neuro- oriented, appropriate, no focal weakness   Diagnostic Tests: None  Impression: Excellent progress 2 months postop Elevated blood pressure-will increase metoprolol dose up to 25 mg twice a day Plan: Return as needed  Len Childs, MD [facility] [phone]

## 2017-11-13 ENCOUNTER — Other Ambulatory Visit: Payer: Self-pay

## 2017-11-13 ENCOUNTER — Inpatient Hospital Stay (HOSPITAL_COMMUNITY): Payer: Medicare Other | Admitting: Certified Registered"

## 2017-11-13 ENCOUNTER — Encounter (HOSPITAL_COMMUNITY): Admission: EM | Disposition: E | Payer: Self-pay | Source: Home / Self Care | Attending: Interventional Cardiology

## 2017-11-13 ENCOUNTER — Emergency Department (HOSPITAL_COMMUNITY): Payer: Medicare Other

## 2017-11-13 ENCOUNTER — Encounter (HOSPITAL_COMMUNITY): Payer: Self-pay

## 2017-11-13 DIAGNOSIS — Z7901 Long term (current) use of anticoagulants: Secondary | ICD-10-CM

## 2017-11-13 DIAGNOSIS — Z79899 Other long term (current) drug therapy: Secondary | ICD-10-CM

## 2017-11-13 DIAGNOSIS — F419 Anxiety disorder, unspecified: Secondary | ICD-10-CM | POA: Diagnosis present

## 2017-11-13 DIAGNOSIS — E1151 Type 2 diabetes mellitus with diabetic peripheral angiopathy without gangrene: Secondary | ICD-10-CM | POA: Diagnosis present

## 2017-11-13 DIAGNOSIS — J9601 Acute respiratory failure with hypoxia: Secondary | ICD-10-CM | POA: Diagnosis not present

## 2017-11-13 DIAGNOSIS — M549 Dorsalgia, unspecified: Secondary | ICD-10-CM | POA: Diagnosis present

## 2017-11-13 DIAGNOSIS — M1990 Unspecified osteoarthritis, unspecified site: Secondary | ICD-10-CM | POA: Diagnosis present

## 2017-11-13 DIAGNOSIS — E785 Hyperlipidemia, unspecified: Secondary | ICD-10-CM | POA: Diagnosis present

## 2017-11-13 DIAGNOSIS — E1122 Type 2 diabetes mellitus with diabetic chronic kidney disease: Secondary | ICD-10-CM | POA: Diagnosis present

## 2017-11-13 DIAGNOSIS — N400 Enlarged prostate without lower urinary tract symptoms: Secondary | ICD-10-CM | POA: Diagnosis present

## 2017-11-13 DIAGNOSIS — K219 Gastro-esophageal reflux disease without esophagitis: Secondary | ICD-10-CM | POA: Diagnosis present

## 2017-11-13 DIAGNOSIS — I480 Paroxysmal atrial fibrillation: Secondary | ICD-10-CM | POA: Diagnosis present

## 2017-11-13 DIAGNOSIS — Z85820 Personal history of malignant melanoma of skin: Secondary | ICD-10-CM | POA: Diagnosis not present

## 2017-11-13 DIAGNOSIS — Z8673 Personal history of transient ischemic attack (TIA), and cerebral infarction without residual deficits: Secondary | ICD-10-CM | POA: Diagnosis not present

## 2017-11-13 DIAGNOSIS — R29700 NIHSS score 0: Secondary | ICD-10-CM | POA: Diagnosis present

## 2017-11-13 DIAGNOSIS — I25729 Atherosclerosis of autologous artery coronary artery bypass graft(s) with unspecified angina pectoris: Secondary | ICD-10-CM | POA: Diagnosis present

## 2017-11-13 DIAGNOSIS — I959 Hypotension, unspecified: Secondary | ICD-10-CM | POA: Diagnosis present

## 2017-11-13 DIAGNOSIS — N183 Chronic kidney disease, stage 3 (moderate): Secondary | ICD-10-CM | POA: Diagnosis present

## 2017-11-13 DIAGNOSIS — I130 Hypertensive heart and chronic kidney disease with heart failure and stage 1 through stage 4 chronic kidney disease, or unspecified chronic kidney disease: Secondary | ICD-10-CM | POA: Diagnosis present

## 2017-11-13 DIAGNOSIS — I5033 Acute on chronic diastolic (congestive) heart failure: Secondary | ICD-10-CM | POA: Diagnosis present

## 2017-11-13 DIAGNOSIS — I252 Old myocardial infarction: Secondary | ICD-10-CM | POA: Diagnosis not present

## 2017-11-13 DIAGNOSIS — Z66 Do not resuscitate: Secondary | ICD-10-CM | POA: Diagnosis present

## 2017-11-13 DIAGNOSIS — R001 Bradycardia, unspecified: Secondary | ICD-10-CM | POA: Diagnosis not present

## 2017-11-13 DIAGNOSIS — R451 Restlessness and agitation: Secondary | ICD-10-CM | POA: Diagnosis present

## 2017-11-13 DIAGNOSIS — Z951 Presence of aortocoronary bypass graft: Secondary | ICD-10-CM

## 2017-11-13 DIAGNOSIS — Z8349 Family history of other endocrine, nutritional and metabolic diseases: Secondary | ICD-10-CM

## 2017-11-13 DIAGNOSIS — I469 Cardiac arrest, cause unspecified: Secondary | ICD-10-CM

## 2017-11-13 DIAGNOSIS — I462 Cardiac arrest due to underlying cardiac condition: Secondary | ICD-10-CM | POA: Diagnosis present

## 2017-11-13 DIAGNOSIS — I249 Acute ischemic heart disease, unspecified: Secondary | ICD-10-CM | POA: Diagnosis present

## 2017-11-13 DIAGNOSIS — M109 Gout, unspecified: Secondary | ICD-10-CM | POA: Diagnosis present

## 2017-11-13 DIAGNOSIS — Z96652 Presence of left artificial knee joint: Secondary | ICD-10-CM | POA: Diagnosis present

## 2017-11-13 DIAGNOSIS — R32 Unspecified urinary incontinence: Secondary | ICD-10-CM | POA: Diagnosis present

## 2017-11-13 DIAGNOSIS — I2510 Atherosclerotic heart disease of native coronary artery without angina pectoris: Secondary | ICD-10-CM | POA: Diagnosis not present

## 2017-11-13 DIAGNOSIS — R413 Other amnesia: Secondary | ICD-10-CM | POA: Diagnosis present

## 2017-11-13 DIAGNOSIS — I214 Non-ST elevation (NSTEMI) myocardial infarction: Secondary | ICD-10-CM | POA: Diagnosis present

## 2017-11-13 DIAGNOSIS — Z809 Family history of malignant neoplasm, unspecified: Secondary | ICD-10-CM

## 2017-11-13 DIAGNOSIS — Z7982 Long term (current) use of aspirin: Secondary | ICD-10-CM

## 2017-11-13 DIAGNOSIS — Z8249 Family history of ischemic heart disease and other diseases of the circulatory system: Secondary | ICD-10-CM

## 2017-11-13 DIAGNOSIS — Z888 Allergy status to other drugs, medicaments and biological substances status: Secondary | ICD-10-CM

## 2017-11-13 DIAGNOSIS — Z87891 Personal history of nicotine dependence: Secondary | ICD-10-CM

## 2017-11-13 HISTORY — PX: LEFT HEART CATH AND CORS/GRAFTS ANGIOGRAPHY: CATH118250

## 2017-11-13 LAB — TROPONIN I
Troponin I: 0.55 ng/mL (ref <0.03)
Troponin I: 1.69 ng/mL (ref <0.03)

## 2017-11-13 LAB — CBC
HEMATOCRIT: 37.3 % — AB (ref 39.0–52.0)
HEMATOCRIT: 39.5 % (ref 39.0–52.0)
Hemoglobin: 12.3 g/dL — ABNORMAL LOW (ref 13.0–17.0)
Hemoglobin: 13 g/dL (ref 13.0–17.0)
MCH: 33.8 pg (ref 26.0–34.0)
MCH: 35.1 pg — AB (ref 26.0–34.0)
MCHC: 33 g/dL (ref 30.0–36.0)
MCHC: 32.9 g/dL (ref 30.0–36.0)
MCV: 102.5 fL — AB (ref 78.0–100.0)
MCV: 106.8 fL — AB (ref 78.0–100.0)
PLATELETS: 193 10*3/uL (ref 150–400)
Platelets: 158 10*3/uL (ref 150–400)
RBC: 3.64 MIL/uL — ABNORMAL LOW (ref 4.22–5.81)
RBC: 3.7 MIL/uL — ABNORMAL LOW (ref 4.22–5.81)
RDW: 14.1 % (ref 11.5–15.5)
RDW: 14.4 % (ref 11.5–15.5)
WBC: 7.9 10*3/uL (ref 4.0–10.5)
WBC: 15.1 10*3/uL — ABNORMAL HIGH (ref 4.0–10.5)

## 2017-11-13 LAB — BASIC METABOLIC PANEL
Anion gap: 10 (ref 5–15)
Anion gap: 18 — ABNORMAL HIGH (ref 5–15)
BUN: 35 mg/dL — AB (ref 6–20)
BUN: 38 mg/dL — ABNORMAL HIGH (ref 6–20)
CALCIUM: 9.2 mg/dL (ref 8.9–10.3)
CHLORIDE: 110 mmol/L (ref 101–111)
CHLORIDE: 112 mmol/L — AB (ref 101–111)
CO2: 19 mmol/L — AB (ref 22–32)
CO2: 8 mmol/L — AB (ref 22–32)
CREATININE: 2.65 mg/dL — AB (ref 0.61–1.24)
Calcium: 9.3 mg/dL (ref 8.9–10.3)
Creatinine, Ser: 2.32 mg/dL — ABNORMAL HIGH (ref 0.61–1.24)
GFR calc Af Amer: 28 mL/min — ABNORMAL LOW (ref >60)
GFR calc Af Amer: 24 mL/min — ABNORMAL LOW (ref >60)
GFR calc non Af Amer: 24 mL/min — ABNORMAL LOW (ref >60)
GFR calc non Af Amer: 21 mL/min — ABNORMAL LOW (ref >60)
GLUCOSE: 107 mg/dL — AB (ref 65–99)
GLUCOSE: 197 mg/dL — AB (ref 65–99)
POTASSIUM: 4.3 mmol/L (ref 3.5–5.1)
Potassium: 6.5 mmol/L (ref 3.5–5.1)
Sodium: 139 mmol/L (ref 135–145)
Sodium: 138 mmol/L (ref 135–145)

## 2017-11-13 LAB — PROTIME-INR
INR: 1.22
Prothrombin Time: 15.3 seconds — ABNORMAL HIGH (ref 11.4–15.2)

## 2017-11-13 LAB — BRAIN NATRIURETIC PEPTIDE: B Natriuretic Peptide: 1103.6 pg/mL — ABNORMAL HIGH (ref 0.0–100.0)

## 2017-11-13 LAB — CBG MONITORING, ED: Glucose-Capillary: 103 mg/dL — ABNORMAL HIGH (ref 65–99)

## 2017-11-13 LAB — APTT: APTT: 31 s (ref 24–36)

## 2017-11-13 LAB — LIPID PANEL
Cholesterol: 127 mg/dL (ref 0–200)
HDL: 30 mg/dL — ABNORMAL LOW (ref >40)
LDL CALC: 77 mg/dL (ref 0–99)
Total CHOL/HDL Ratio: 4.2 RATIO
Triglycerides: 99 mg/dL (ref <150)
VLDL: 20 mg/dL (ref 0–40)

## 2017-11-13 LAB — MRSA PCR SCREENING: MRSA by PCR: POSITIVE — AB

## 2017-11-13 LAB — LACTIC ACID, PLASMA: Lactic Acid, Venous: 9.1 mmol/L (ref 0.5–1.9)

## 2017-11-13 SURGERY — LEFT HEART CATH AND CORS/GRAFTS ANGIOGRAPHY
Anesthesia: LOCAL

## 2017-11-13 MED ORDER — SODIUM CHLORIDE 0.9 % IV SOLN
Freq: Once | INTRAVENOUS | Status: AC
  Administered 2017-11-13: 100 mL/h via INTRAVENOUS

## 2017-11-13 MED ORDER — ONDANSETRON HCL 4 MG/2ML IJ SOLN
INTRAMUSCULAR | Status: DC | PRN
  Administered 2017-11-13: 4 mg via INTRAVENOUS

## 2017-11-13 MED ORDER — VERAPAMIL HCL 2.5 MG/ML IV SOLN
INTRAVENOUS | Status: DC | PRN
  Administered 2017-11-13: 10 mL via INTRA_ARTERIAL

## 2017-11-13 MED ORDER — HEPARIN (PORCINE) IN NACL 100-0.45 UNIT/ML-% IJ SOLN: 850.0000 [IU]/h | INTRAMUSCULAR | Status: DC

## 2017-11-13 MED ORDER — NITROGLYCERIN 0.4 MG SL SUBL
0.4000 mg | SUBLINGUAL_TABLET | SUBLINGUAL | Status: DC | PRN
Start: 2017-11-13 — End: 2017-11-13

## 2017-11-13 MED ORDER — SODIUM CHLORIDE 0.9% FLUSH: 3.0000 mL | Freq: Two times a day (BID) | INTRAVENOUS | Status: DC

## 2017-11-13 MED ORDER — SODIUM CHLORIDE 0.9 % IV SOLN: 250.0000 mL | INTRAVENOUS | Status: DC | PRN

## 2017-11-13 MED ORDER — ACETAMINOPHEN 325 MG PO TABS: 650.0000 mg | ORAL_TABLET | ORAL | Status: DC | PRN

## 2017-11-13 MED ORDER — ASPIRIN 81 MG PO CHEW: 81.0000 mg | CHEWABLE_TABLET | ORAL | Status: DC

## 2017-11-13 MED ORDER — ONDANSETRON HCL 4 MG/2ML IJ SOLN
INTRAMUSCULAR | Status: AC
  Filled 2017-11-13: qty 2

## 2017-11-13 MED ORDER — ASPIRIN 81 MG PO CHEW: 324.0000 mg | CHEWABLE_TABLET | Freq: Once | ORAL | Status: DC

## 2017-11-13 MED ORDER — ASPIRIN 81 MG PO CHEW: 81.0000 mg | CHEWABLE_TABLET | Freq: Every day | ORAL | Status: DC

## 2017-11-13 MED ORDER — HEPARIN SODIUM (PORCINE) 1000 UNIT/ML IJ SOLN
INTRAMUSCULAR | Status: AC
  Filled 2017-11-13: qty 1

## 2017-11-13 MED ORDER — MUPIROCIN 2 % EX OINT: 1.0000 "application " | TOPICAL_OINTMENT | Freq: Two times a day (BID) | CUTANEOUS | Status: DC

## 2017-11-13 MED ORDER — IOPAMIDOL (ISOVUE-370) INJECTION 76%
INTRAVENOUS | Status: AC
  Filled 2017-11-13: qty 125

## 2017-11-13 MED ORDER — FENTANYL CITRATE (PF) 100 MCG/2ML IJ SOLN
INTRAMUSCULAR | Status: AC
  Filled 2017-11-13: qty 2

## 2017-11-13 MED ORDER — ONDANSETRON HCL 4 MG/2ML IJ SOLN: 4.0000 mg | Freq: Four times a day (QID) | INTRAMUSCULAR | Status: DC | PRN

## 2017-11-13 MED ORDER — FLEET ENEMA 7-19 GM/118ML RE ENEM
1.0000 | ENEMA | Freq: Every day | RECTAL | Status: DC | PRN
  Filled 2017-11-13: qty 1

## 2017-11-13 MED ORDER — PANTOPRAZOLE SODIUM 40 MG PO TBEC: 40.0000 mg | DELAYED_RELEASE_TABLET | Freq: Every day | ORAL | Status: DC

## 2017-11-13 MED ORDER — SODIUM CHLORIDE 0.9% FLUSH: 3.0000 mL | INTRAVENOUS | Status: DC | PRN

## 2017-11-13 MED ORDER — SENNOSIDES-DOCUSATE SODIUM 8.6-50 MG PO TABS: 1.0000 | ORAL_TABLET | Freq: Every day | ORAL | Status: DC

## 2017-11-13 MED ORDER — HEPARIN (PORCINE) IN NACL 2-0.9 UNIT/ML-% IJ SOLN
INTRAMUSCULAR | Status: AC
  Filled 2017-11-13: qty 1000

## 2017-11-13 MED ORDER — SODIUM CHLORIDE 0.9 % IV BOLUS (SEPSIS)
250.0000 mL | Freq: Once | INTRAVENOUS | Status: AC
  Administered 2017-11-13: 250 mL via INTRAVENOUS

## 2017-11-13 MED ORDER — HEPARIN (PORCINE) IN NACL 2-0.9 UNIT/ML-% IJ SOLN
INTRAMUSCULAR | Status: AC | PRN
  Administered 2017-11-13: 1500 mL

## 2017-11-13 MED ORDER — POLYETHYLENE GLYCOL 3350 17 G PO PACK: 17.0000 g | PACK | Freq: Every day | ORAL | Status: DC | PRN

## 2017-11-13 MED ORDER — ROSUVASTATIN CALCIUM 10 MG PO TABS: 10.0000 mg | ORAL_TABLET | Freq: Every day | ORAL | Status: DC

## 2017-11-13 MED ORDER — HEPARIN SODIUM (PORCINE) 1000 UNIT/ML IJ SOLN
INTRAMUSCULAR | Status: DC | PRN
  Administered 2017-11-13: 3000 [IU] via INTRAVENOUS

## 2017-11-13 MED ORDER — ASPIRIN 81 MG PO TBEC: 81.0000 mg | DELAYED_RELEASE_TABLET | Freq: Every day | ORAL | Status: DC

## 2017-11-13 MED ORDER — LIDOCAINE HCL (PF) 1 % IJ SOLN
INTRAMUSCULAR | Status: DC | PRN
Start: 2017-11-13 — End: 2017-11-13
  Administered 2017-11-13: 20 mL
  Administered 2017-11-13: 2 mL

## 2017-11-13 MED ORDER — IOPAMIDOL (ISOVUE-370) INJECTION 76%
INTRAVENOUS | Status: DC | PRN
  Administered 2017-11-13: 25 mL via INTRA_ARTERIAL

## 2017-11-13 MED ORDER — FINASTERIDE 5 MG PO TABS: 5.0000 mg | ORAL_TABLET | Freq: Every day | ORAL | Status: DC

## 2017-11-13 MED ORDER — MORPHINE SULFATE (PF) 4 MG/ML IV SOLN
4.0000 mg | Freq: Once | INTRAVENOUS | Status: AC
  Administered 2017-11-13: 4 mg via INTRAVENOUS
  Filled 2017-11-13: qty 1

## 2017-11-13 MED ORDER — NITROGLYCERIN IN D5W 200-5 MCG/ML-% IV SOLN
0.0000 ug/min | INTRAVENOUS | Status: DC
Start: 2017-11-13 — End: 2017-11-14
  Administered 2017-11-13: 5 ug/min via INTRAVENOUS
  Filled 2017-11-13: qty 250

## 2017-11-13 MED ORDER — FENTANYL CITRATE (PF) 100 MCG/2ML IJ SOLN
INTRAMUSCULAR | Status: DC | PRN
  Administered 2017-11-13: 25 ug via INTRAVENOUS

## 2017-11-13 MED ORDER — ALLOPURINOL 300 MG PO TABS: 150.0000 mg | ORAL_TABLET | Freq: Every day | ORAL | Status: DC

## 2017-11-13 MED ORDER — CHLORHEXIDINE GLUCONATE CLOTH 2 % EX PADS: 6.0000 | MEDICATED_PAD | Freq: Every day | CUTANEOUS | Status: DC

## 2017-11-13 MED ORDER — SODIUM CHLORIDE 0.9 % IV SOLN
INTRAVENOUS | Status: AC | PRN
  Administered 2017-11-13: 100 mL/h via INTRAVENOUS

## 2017-11-13 MED ORDER — NITROGLYCERIN 2 % TD OINT
1.0000 [in_us] | TOPICAL_OINTMENT | Freq: Four times a day (QID) | TRANSDERMAL | Status: DC
  Administered 2017-11-13: 1 [in_us] via TOPICAL
  Filled 2017-11-13: qty 1

## 2017-11-13 MED ORDER — NITROGLYCERIN 0.4 MG SL SUBL: 0.4000 mg | SUBLINGUAL_TABLET | SUBLINGUAL | Status: DC | PRN

## 2017-11-13 MED ORDER — VERAPAMIL HCL 2.5 MG/ML IV SOLN
INTRAVENOUS | Status: AC
  Filled 2017-11-13: qty 2

## 2017-11-13 MED ORDER — FUROSEMIDE 10 MG/ML IJ SOLN: 40.0000 mg | Freq: Once | INTRAMUSCULAR | Status: DC

## 2017-11-13 MED ORDER — EPINEPHRINE PF 1 MG/10ML IJ SOSY
PREFILLED_SYRINGE | INTRAMUSCULAR | Status: AC
  Filled 2017-11-13: qty 20

## 2017-11-13 MED ORDER — HEPARIN (PORCINE) IN NACL 100-0.45 UNIT/ML-% IJ SOLN
850.0000 [IU]/h | INTRAMUSCULAR | Status: DC
  Administered 2017-11-13: 850 [IU]/h via INTRAVENOUS
  Filled 2017-11-13: qty 250

## 2017-11-13 MED ORDER — ACETAMINOPHEN 500 MG PO TABS
1000.0000 mg | ORAL_TABLET | Freq: Three times a day (TID) | ORAL | Status: DC | PRN
Start: 2017-11-13 — End: 2017-11-13

## 2017-11-13 MED ORDER — SODIUM CHLORIDE 0.9 % IV SOLN: INTRAVENOUS | Status: DC

## 2017-11-13 MED ORDER — LIDOCAINE HCL (PF) 1 % IJ SOLN
INTRAMUSCULAR | Status: AC
  Filled 2017-11-13: qty 30

## 2017-11-13 MED ORDER — HEPARIN SODIUM (PORCINE) 5000 UNIT/ML IJ SOLN
60.0000 [IU]/kg | Freq: Once | INTRAMUSCULAR | Status: AC
  Administered 2017-11-13: 4000 [IU] via INTRAVENOUS

## 2017-11-13 MED FILL — Medication: Qty: 1 | Status: AC

## 2017-11-13 SURGICAL SUPPLY — 14 items
CATH INFINITI 5FR JL4 (CATHETERS) ×1 IMPLANT
CATH INFINITI 5FR MPB2 (CATHETERS) ×1 IMPLANT
CATH INFINITI JR4 5F (CATHETERS) ×1 IMPLANT
DEVICE RAD COMP TR BAND LRG (VASCULAR PRODUCTS) ×1 IMPLANT
GLIDESHEATH SLEND A-KIT 6F 22G (SHEATH) ×1 IMPLANT
GLIDESHEATH SLEND SS 6F .021 (SHEATH) ×1 IMPLANT
GUIDEWIRE INQWIRE 1.5J.035X260 (WIRE) IMPLANT
INQWIRE 1.5J .035X260CM (WIRE) ×2
KIT HEART LEFT (KITS) ×2 IMPLANT
PACK CARDIAC CATHETERIZATION (CUSTOM PROCEDURE TRAY) ×2 IMPLANT
SHEATH PINNACLE 5F 10CM (SHEATH) ×1 IMPLANT
TRANSDUCER W/STOPCOCK (MISCELLANEOUS) ×2 IMPLANT
TUBING CIL FLEX 10 FLL-RA (TUBING) ×2 IMPLANT
WIRE EMERALD 3MM-J .035X150CM (WIRE) ×1 IMPLANT

## 2017-11-16 ENCOUNTER — Encounter (HOSPITAL_COMMUNITY): Payer: Self-pay | Admitting: Interventional Cardiology

## 2017-11-24 NOTE — ED Notes (Signed)
Ferol Luz PA explained admission and plan of care to pt. and family .

## 2017-11-24 NOTE — H&P (Addendum)
The patient has been seen in conjunction with Stanley Robles, PAC. All aspects of care have been considered and discussed. The patient has been personally interviewed, examined, and all clinical data has been reviewed.   Severe vascular disease with ACS presentation. Suspect SVG failure. Severe CP with ischemic ST changes.  CKD stage 3-4.  DNR/DNI rescinded by patient.  Plan cath later today or urgently if recurrent CP.  High risk for complications and death discussed with the patient and family who understand and encourage [place] to proceed. Last Eliquis last 6 PM.  IV NTG  IV heparin   CRITICAL CARE Time: 40 minutes.  Cardiology Admission History and Physical:   Patient ID: Stanley Robles; MRN: [id number]; DOB: 27-Jul-1932   Admission date: 11-14-17  Primary Care Provider: Glenda Chroman, MD Primary Cardiologist:Dr. Wynonia Robles   Chief Complaint:  Chest pain   Patient Profile:   Stanley Robles is a 84 y.o. male with a history of with hx of CAD s/p recent CABG, PAF on eliquis, CVA, PVD, HTN, HLD and memory issue presented by EMS for chest pain found to have STEMI/NSTEMI.   The patient was admitted 07/2017 with unstable angina. cardiac catheterization showing significant left main and proximal LAD stenosis with chronic occlusion of the RCA. LV EDP was elevated 25 mmHg. Underwent a coronary bypass grafting 2 on 08/08/2017 with Dr. Prescott Robles (LIMA to LAD and SVG to Circ; unable to do RIMA to RCA).  Hospital course complicated by DKA, pneumonia and A. Fib.  History of Present Illness:   Stanley Robles had a substernal chest pressure last night requiring sublingual nitroglycerin x 3 and aspirin 325 mg.  By the time EMS arrived he was chest pain-free and did not needed to come to hospital.  However, his pain reoccurred around 3 AM this morning.  Required sublingual nitroglycerin x 2 and aspirin.  EMS was called.  He was chest pain-free upon arrival to ER.  His initial EKG shows sinus rhythm with  mild ST depression in lateral lead.  However his pain reoccurred.  Repeat EKG shows worsening ST depression in lateral leads ST elevation in aVR.  He was given heparin bolus and nitro patch.  His pain improved to 1-2 out of 10.  Troponin I 0.55.  Hemoglobin 12.3.  Serum creatinine 2.3. Past Medical History:  Diagnosis Date  . BPH (benign prostatic hypertrophy)   . Cancer ([place])    CANCEROUS MOLE REMOVED FROM BACK - SEVERAL YRS AGO  . Chronic kidney disease stage 3   . Coronary artery disease    Stanley Robles IS PT'S CARDIOLGIST  . GERD (gastroesophageal reflux disease)   . Gout   . Hyperlipidemia   . Hypertensive heart disease   . Incontinence of urine   . Memory loss   . Myocardial infarction ([place]) 1994  . Osteoarthritis   . Paroxysmal atrial fibrillation (HCC)   . Peripheral vascular disease ([place])   . Personal history of transient ischemic attack (TIA) and cerebral infarction without residual deficit   . Right pontine stroke ([place]) 02/27/2015  . Tooth disease     Past Surgical History:  Procedure Laterality Date  . CARDIAC CATHETERIZATION  07/21/2005  . CORONARY ARTERY BYPASS GRAFT N/A 08/07/2017   Procedure: CORONARY ARTERY BYPASS GRAFTING (CABG) x 2 WITH ENDOSCOPIC HARVESTING OF RIGHT SAPHENOUS VEIN.;  Surgeon: Stanley Poot, MD;  Location: [facility];  Service: Open Heart Surgery;  Laterality: N/A;  . HERNIA REPAIR  12/26/2002   BILATERAL INGUINAL HERNIA  REPAIR  . LEFT HEART CATH AND CORONARY ANGIOGRAPHY N/A 08/03/2017   Procedure: LEFT HEART CATH AND CORONARY ANGIOGRAPHY;  Surgeon: Sherren Mocha, MD;  Location: [facility];  Service: Cardiovascular;  Laterality: N/A;  . MASTOID SURGERY X 5    . PROSTATE SURGERY    . TEE WITHOUT CARDIOVERSION N/A 08/07/2017   Procedure: TRANSESOPHAGEAL ECHOCARDIOGRAM (TEE);  Surgeon: Stanley Robles, Collier Salina, MD;  [facility]: [facility];  Service: Open Heart Surgery;  Laterality: N/A;  . TOTAL KNEE ARTHROPLASTY Left 03/28/2013   Procedure: LEFT TOTAL KNEE  ARTHROPLASTY;  Surgeon: Gearlean Alf, MD;  Location: [facility];  Service: Orthopedics;  Laterality: Left;     Medications Prior to Admission: Prior to Admission medications   Medication Sig Start Date End Date Taking? Authorizing Provider  acetaminophen (TYLENOL) 500 MG tablet Take 1,000 mg by mouth every 8 (eight) hours as needed for mild pain.    Yes [provider]  allopurinol (ZYLOPRIM) 300 MG tablet Take 0.5 tablets (150 mg total) by mouth daily. 08/28/17  Yes Stanley Pinks M, PA-C  apixaban (ELIQUIS) 2.5 MG TABS tablet Take 2.5 mg 2 (two) times daily by mouth.   Yes [provider]  aspirin EC 81 MG EC tablet Take 1 tablet (81 mg total) by mouth daily. 08/28/17  Yes Stanley Pinks M, PA-C  Cholecalciferol (VITAMIN D3) 2000 units TABS Take 2,000 Units by mouth daily.   Yes [provider]  finasteride (PROSCAR) 5 MG tablet Take 5 mg by mouth daily.   Yes [provider]  metoprolol tartrate (LOPRESSOR) 25 MG tablet Take 25 mg by mouth 2 (two) times daily.    Yes [provider]  nitroGLYCERIN (NITROSTAT) 0.4 MG SL tablet Place 0.4 mg under the tongue every 5 (five) minutes as needed for chest pain.   Yes [provider]  omeprazole (PRILOSEC) 20 MG capsule Take 20 mg by mouth daily.    Yes [provider]  polyethylene glycol (MIRALAX / GLYCOLAX) packet Take 17 g by mouth daily as needed for moderate constipation.    Yes [provider]  rosuvastatin (CRESTOR) 10 MG tablet Take 1 tablet (10 mg total) by mouth daily. 08/28/17  Yes Stanley Robles, Stanley M, PA-C  senna-docusate (SENOKOT-S) 8.6-50 MG tablet Take 1 tablet by mouth daily.   Yes [provider]  sodium phosphate (FLEET) 7-19 GM/118ML ENEM Place 1 enema daily as needed rectally for severe constipation.   Yes [provider]     Allergies:    Allergies  Allergen Reactions  . Lipitor [Atorvastatin] Other (See Comments)    Severe  constipation    Social History:   Social History   Socioeconomic History  . Marital status: Widowed    Spouse name: Not on file  . Number of children: 3  . Years of education: 43  . Highest education level: Not on file  Social Needs  . Financial resource strain: Not on file  . Food insecurity - worry: Not on file  . Food insecurity - inability: Not on file  . Transportation needs - medical: Not on file  . Transportation needs - non-medical: Not on file  Occupational History  . Occupation: retired    Comment: Regulatory affairs officer  Tobacco Use  . Smoking status: Former Research scientist (life sciences)  . Smokeless tobacco: Never Used  . Tobacco comment: QUIT SMOKING PRIOR TO 1980  Substance and Sexual Activity  . Alcohol use: No  . Drug use: No  . Sexual activity:  Not on file  Other Topics Concern  . Not on file  Social History Narrative   Retired Academic librarian and body.  Widower. Has sig other   Right handed   Caffeine use - coffee 1 cup daily    Family History:   The patient's family history includes Cancer in his brother; Hyperlipidemia in his mother; Hypertension in his father and mother.    ROS:  Please see the history of present illness.  All other ROS reviewed and negative.     Physical Exam/Data:   Vitals:   11-17-2017 0615 Nov 17, 2017 0630 2017-11-17 0710 17-Nov-2017 0715  BP: 123/72 127/68 105/61 109/64  Pulse: 91 91 89 90  Resp: (!) 24 20 16 18   SpO2: 95% 91% 94% 95%    General:  Well nourished, well developed, in no acute distress HEENT: normal Lymph: no adenopathy Neck: no JVD Endocrine:  No thryomegaly Vascular: No carotid bruits; FA pulses 2+ bilaterally without bruits  Cardiac:  normal S1, S2; RRR; no murmur  Lungs:  clear to auscultation bilaterally, no wheezing, rhonchi or rales  Abd: soft, nontender, no hepatomegaly  Ext: no edema Musculoskeletal:  No deformities, BUE and BLE strength normal and equal Skin: warm and dry  Neuro:  CNs 2-12 intact, no focal abnormalities noted Psych:   Normal affect     Relevant CV Studies:  LEFT HEART CATH AND CORONARY ANGIOGRAPHY  Conclusion     Ost LM lesion, 75 %stenosed.   1. Severe ostial stenosis of left circumflex with a 95% ostial lesion 2. Severe left main stenosis with calcification and a filling defect that may represent a plaque rupture 3. Chronic total occlusion of the right coronary artery collateralized by left to right collaterals 4. Normal LV function by echo  Recommendation: With severe left main disease and severe stenosis at the ostium of the left circumflex recommend cardiac surgical consultation for CABG. I don't think the patient's coronary disease is amenable to PCI. Discussed with Dr Stanley Robles.   Echo 08/02/17 Study Conclusions  - Left ventricle: The cavity size was normal. There was mild   concentric hypertrophy. Systolic function was vigorous. The   estimated ejection fraction was in the range of 65% to 70%. Wall   motion was normal; there were no regional wall motion   abnormalities. - Aortic valve: There was mild regurgitation. - Mitral valve: Calcified annulus. There was moderate   regurgitation. - Left atrium: The atrium was mildly dilated. - Right ventricle: The cavity size was mildly dilated. Wall   thickness was normal. - Pulmonary arteries: Systolic pressure was mildly increased. PA   peak pressure: 33 mm Hg (S).  Impressions:  - Compared to the prior study, there has been no significant   interval change.  Laboratory Data:  Chemistry Recent Labs  Lab November 17, 2017 0552  NA 139  K 4.3  CL 110  CO2 19*  GLUCOSE 107*  BUN 35*  CREATININE 2.32*  CALCIUM 9.3  GFRNONAA 24*  GFRAA 28*  ANIONGAP 10    No results for input(s): PROT, ALBUMIN, AST, ALT, ALKPHOS, BILITOT in the last 168 hours. Hematology Recent Labs  Lab November 17, 2017 0552  WBC 7.9  RBC 3.64*  HGB 12.3*  HCT 37.3*  MCV 102.5*  MCH 33.8  MCHC 33.0  RDW 14.1  PLT 158   Cardiac Enzymes Recent Labs  Lab  2017-11-17 0552  TROPONINI 0.55*   No results for input(s): TROPIPOC in the last 168 hours.  BNPNo results for input(s): BNP, PROBNP  in the last 168 hours.  DDimer No results for input(s): DDIMER in the last 168 hours.  Radiology/Studies:  Dg Chest Portable 1 View  Result Date: 11/25/2017 CLINICAL DATA:  Chest pain today. EXAM: PORTABLE CHEST 1 VIEW COMPARISON:  Radiographs 09/30/2017 FINDINGS: Post median sternotomy. Increase cardiomegaly. Development of pleural effusion and hazy bibasilar opacities. Perihilar pulmonary edema, at least moderate in degree. Aortic arch atherosclerosis. No confluent airspace disease. Surgical clips in the right axilla. IMPRESSION: Moderate CHF. Electronically Signed   By: Jeb Levering Robles.D.   On: November 25, 2017 06:29    Assessment and Plan:   1. NSTEMI -Troponin I 0.55.  Worsening ST depression in lateral leads with ST elevation in aVL.  His pain improved after nitro patch in ER.  Last dose of Eliquis p.Robles. of 11/12/17. -We will give him bolus of IV saline 250 cc  Afterwards 100 cc/h for hydration.  Continue Nitro patch changed to IV nitro at 5 MCG per hour when systolic blood pressure above 100. -Patient wants to be DNR and DNI.  However, he is agree with support if required only during cath. -Continue IV heparin and close follow-up.  Cath later today. The patient understands that risks include but are not limited to stroke (1 in 1000), death (1 in 6), kidney failure [usually temporary] (1 in 500), bleeding (1 in 200), allergic reaction [possibly serious] (1 in 200), and agrees to proceed.  -The patient is not in heart failure.  Check BNP. Normal LVEF by echo 07/2017.  2.  Paroxysmal atrial fibrillation -Maintaining sinus rhythm.  Hold Eliquis.  Continue IV heparin.  3.  Hypertension -Currently hypotensive.  Hydration as above.  4.  CKD, stage III -Creatinine as baseline.  Follow closely. Severity of Illness: The appropriate patient status for this  patient is INPATIENT. Inpatient status is judged to be reasonable and necessary in order to provide the required intensity of service to ensure the patient's safety. The patient's presenting symptoms, physical exam findings, and initial radiographic and laboratory data in the context of their chronic comorbidities is felt to place them at high risk for further clinical deterioration. Furthermore, it is not anticipated that the patient will be medically stable for discharge from the hospital within 2 midnights of admission. The following factors support the patient status of inpatient.   " The patient's presenting symptoms include.  Chest pain  " The worrisome physical exam findings include pain  " The initial radiographic and laboratory data are worrisome because of - NSTEMI " The chronic co-morbidities include - CVA, CABG, PAF   * I certify that at the point of admission it is my clinical judgment that the patient will require inpatient hospital care spanning beyond 2 midnights from the point of admission due to high intensity of service, high risk for further deterioration and high frequency of surveillance required.*    For questions or updates, please contact [facility] Please consult [email or web address] for contact info under Cardiology/STEMI.    Jarrett Soho, [place]  November 25, 2017 7:52 AM

## 2017-11-24 NOTE — Progress Notes (Signed)
This note also relates to the following rows which could not be included: BP - Cannot attach notes to unvalidated device data  RT called to room by RN. Patient in respiratory distress with high RR and increased WOB. RT placed patient on bipap per MD and patient is now being diuresed. Vitals are stable. RT will continue to monitor.

## 2017-11-24 NOTE — Interval H&P Note (Signed)
Cath Lab Visit (complete for each Cath Lab visit)  Clinical Evaluation Leading to the Procedure:   ACS: Yes.    Non-ACS:    Anginal Classification: CCS IV  Anti-ischemic medical therapy: Minimal Therapy (1 class of medications)  Non-Invasive Test Results: No non-invasive testing performed  Prior CABG: Previous CABG      History and Physical Interval Note:  2017-11-23 3:39 PM  Stanley Robles  has presented today for surgery, with the diagnosis of cp  The various methods of treatment have been discussed with the patient and family. After consideration of risks, benefits and other options for treatment, the patient has consented to  Procedure(s): LEFT HEART CATH AND CORS/GRAFTS ANGIOGRAPHY (N/A) as a surgical intervention .  The patient's history has been reviewed, patient examined, no change in status, stable for surgery.  I have reviewed the patient's chart and labs.  Questions were answered to the patient's satisfaction.     Belva Crome III

## 2017-11-24 NOTE — ED Notes (Signed)
Cardiology notified of troponin results.  

## 2017-11-24 NOTE — ED Triage Notes (Signed)
Arrives from home with complaint of chest pain. Called 911 last night around midnight with complaint of chest pain, took 324mg  ASA and 3 NTG tabs at that time. EMS came to his home and found patient to be pain free and he declined transport. Around 0245 the pain returned and patient called 911 again and took another 324mg  ASA. EMS found patient with RA O2 saturation @ 88%; also noted EtCO2 to be low with readings not greater than 20. EMS applied O2 via [place] @ 4L; O2 saturation recoverd to 96%; Patient's chest pain improved from a 6 to a 2.

## 2017-11-24 NOTE — Anesthesia Procedure Notes (Addendum)
Procedure Name: Intubation Date/Time: 2017/11/23 5:48 PM Performed by: Barrington Ellison, CRNA Pre-anesthesia Checklist: Patient identified, Emergency Drugs available, Suction available, Patient being monitored and Timeout performed Oxygen Delivery Method: Ambu bag Preoxygenation: Pre-oxygenation with 100% oxygen Ventilation: Mask ventilation without difficulty Laryngoscope Size: Glidescope and 4 Grade View: Grade I Tube type: Subglottic suction tube Tube size: 7.5 mm Number of attempts: 1 Airway Equipment and Method: Stylet and Oral airway Placement Confirmation: ETT inserted through vocal cords under direct vision,  breath sounds checked- equal and bilateral and CO2 detector Secured at: 22 cm Tube secured with: Tape Dental Injury: Teeth and Oropharynx as per pre-operative assessment

## 2017-11-24 NOTE — Progress Notes (Addendum)
Cardiology Critical Care   The patient underwent coronary angiography from the left radial approach. Unable to complete the procedure because of progressive agitation, back pain from lying, and eventual shortness of breath.  Dr. Prescott Gum confirmed that the saphenous vein graft to the circumflex was from the right subclavian to the obtuse marginal branch.  Upon arriving in the intensive care unit the patient was immediately placed on BiPAP and 40 mg of IV Lasix was administered.  LVEDP at cath demonstrated a value of 31 mmHg.  The family was updated on details from the heart catheterization.  The family also affirmed that advance life support including intubation should be performed if necessary.    Upon my arrival back in the 2 heart unit, the patient was noted to be unresponsive in his bed on BiPAP.  CPR was started.  EKG complexes were noted but no palpable pulses.  He was eventually intubated.  He received at least 3 rounds of epinephrine, an amp of bicarbonate, atropine x 2 for bradycardia, and a total of approximately 20 minutes of CPR.  There was intermittent return of pulse but both dopamine and Levophed drips led to blood pressure of 68TMHD systolic with a heart rate varying between 60 and 90 bpm.  Further update with the family resulted in decision to discontinue any further attempts at CPR and no further escalation of care.  The patient was pronounced dead at [facility].  Family was present with the patient in the room at the time of death.  Cause of death: Cardiogenic shock, acute diastolic heart failure and respiratory arrest, related to suspected occlusion of the saphenous vein graft to the obtuse marginal.  Patient had known total occlusion of the native right coronary with collaterals from the circumflex and LAD.  Critical care time 65 minutes.

## 2017-11-24 NOTE — Progress Notes (Signed)
ANTICOAGULATION CONSULT NOTE  Pharmacy Consult:  Heparin Indication: ACS, history of AFib  Allergies  Allergen Reactions  . Lipitor [Atorvastatin] Other (See Comments)    Severe constipation    Patient Measurements: Height: 5\' 7"  (170.2 cm) Weight: 167 lb 8.8 oz (76 kg) IBW/kg (Calculated) : 66.1 Heparin Dosing Weight: 73 kg  Vital Signs: Temp: 98.2 F (36.8 C) (12/21 1215) Temp Source: Oral (12/21 1215) BP: 120/69 (12/21 1400) Pulse Rate: 82 (12/21 1130)  Labs: Recent Labs    2017-11-18 0552 2017/11/18 0830  HGB 12.3*  --   HCT 37.3*  --   PLT 158  --   APTT 31  --   LABPROT 15.3*  --   INR 1.22  --   CREATININE 2.32*  --   TROPONINI 0.55* 1.69*    Estimated Creatinine Clearance: 22.6 mL/min (A) (by C-G formula based on SCr of 2.32 mg/dL (H)).   Medical History: Past Medical History:  Diagnosis Date  . BPH (benign prostatic hypertrophy)   . 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 Chronic kidney disease stage 3   . Coronary artery disease    DR. TILLEY IS PT'S CARDIOLGIST  . GERD (gastroesophageal reflux disease)   . Gout   . Hyperlipidemia   . Hypertensive heart disease   . Incontinence of urine   . Memory loss   . Myocardial infarction ([place]) 1994  . Osteoarthritis   . Paroxysmal atrial fibrillation ([facility])   . Peripheral vascular disease ([place])   . Personal history of transient ischemic attack (TIA) and cerebral infarction without residual deficit   . Right pontine stroke ([place]) 02/27/2015  . Tooth disease       Assessment: 83 YOM with history of Afib on Eliquis PTA, last dose on 11/12/17.  Patient presented with chest pain and was transitioned to IV heparin.  Now s/p cath and Pharmacy consulted to resume heparin 8 hours post sheath removal.  Sheath removed around 1700 per procedural log.  No bleeding documented.   Goal of Therapy:  Heparin level 0.3-0.7 units/ml Monitor platelets by anticoagulation protocol: Yes     Plan:  On 11/14/17 at 0100, resume heparin gtt at 850 units/hr Check 8 hr heparin level and aPTT Daily heparin level, aPTT and CBC   Declynn Lopresti D. Mina Marble, PharmD, BCPS Pager:  [phone] 11/18/2017, 5:08 PM

## 2017-11-24 NOTE — ED Notes (Signed)
Dr. Smith at bedside.

## 2017-11-24 NOTE — Progress Notes (Signed)
ANTICOAGULATION CONSULT NOTE - Initial Consult  Pharmacy Consult for heparin Indication: chest pain/ACS  Allergies  Allergen Reactions  . Lipitor [Atorvastatin] Other (See Comments)    Severe constipation    Patient Measurements:   Heparin Dosing Weight: 73 kg   Vital Signs: BP: 105/61 (12/21 0710) Pulse Rate: 89 (12/21 0710)  Labs: Recent Labs    2017/12/06 0552  HGB 12.3*  HCT 37.3*  PLT 158  APTT 31  LABPROT 15.3*  INR 1.22  CREATININE 2.32*  TROPONINI 0.55*    CrCl cannot be calculated (Unknown ideal weight.).   Medical History: Past Medical History:  Diagnosis Date  . BPH (benign prostatic hypertrophy)   . 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 Chronic kidney disease stage 3   . Coronary artery disease    DR. TILLEY IS PT'S CARDIOLGIST  . GERD (gastroesophageal reflux disease)   . Gout   . Hyperlipidemia   . Hypertensive heart disease   . Incontinence of urine   . Memory loss   . Myocardial infarction ([place]) 1994  . Osteoarthritis   . Paroxysmal atrial fibrillation ([facility])   . Peripheral vascular disease ([place])   . Personal history of transient ischemic attack (TIA) and cerebral infarction without residual deficit   . Right pontine stroke ([place]) 02/27/2015  . Tooth disease     Medications:   (Not in a hospital admission)  Assessment: 63 YOM with chest pain. Pharmacy consulted to start IV heparin for ACS. Initial troponin is elevated at 0.55. H/H 12.3/37.3. Plt wnl. SCr 2.32. Patient is on apixaban prior to admission for a history of Afib. His last dose of apixaban was yesterday. Patient has already received a bolus of IV heparin in the ED  Goal of Therapy:  Heparin level 0.3-0.7 units/ml Monitor platelets by anticoagulation protocol: Yes   Plan:  -Start IV heparin at 850 units/hr -F/u 8 hr HL/aptt -LHC planned this AM. F/u after cath   Albertina Parr, PharmD., BCPS Clinical Pharmacist Pager [phone]

## 2017-11-24 NOTE — Progress Notes (Signed)
Pt arrived back from the Cath Lab @ 17.10. Placed on BIPAP and Dr. Tamala Julian called as pt was in Resp distress. Pt coded at 17.40. ROSC returned at 17.44. Coded @ 18.01 and ROSC returned @18 .10. At this time family decided on no further escalation of care. Patient expired at 20.34 with family by his side

## 2017-11-24 NOTE — ED Provider Notes (Signed)
[facility] EMERGENCY DEPARTMENT Provider Note   CSN: [id number] Arrival date & time: 11-19-2017  0501     History   Chief Complaint Chief Complaint  Patient presents with  . Chest Pain    HPI Stanley Robles is a 84 y.o. male.  HPI Patient comes in with chief complaint of chest pain. Patient has history of coronary artery disease status post CABG in September 2018.  Patient also has a history of A. fib on Eliquis, CKD.  Patient reports that he had chest pain around 10 PM, with associated left arm pain.  Patient called EMS, but his pain resolved and the EKG looked reassuring so he decided not to come to the hospital.  Around 3:30 in the morning patient started having discomfort again.  In route patient's pain resolved.  Patient currently has no chest pain, shortness of breath.  He has been taking his medicines as prescribed..   Past Medical History:  Diagnosis Date  . BPH (benign prostatic hypertrophy)   . Cancer ([place])    CANCEROUS MOLE REMOVED FROM BACK - SEVERAL YRS AGO  . Chronic kidney disease stage 3   . Coronary artery disease    DR. TILLEY IS PT'S CARDIOLGIST  . GERD (gastroesophageal reflux disease)   . Gout   . Hyperlipidemia   . Hypertensive heart disease   . Incontinence of urine   . Memory loss   . Myocardial infarction ([facility]) 1994  . Osteoarthritis   . Paroxysmal atrial fibrillation ([facility])   . Peripheral vascular disease ([place])   . Personal history of transient ischemic attack (TIA) and cerebral infarction without residual deficit   . Right pontine stroke ([place]) 02/27/2015  . Tooth disease     Patient Active Problem List   Diagnosis Date Noted  . Pneumonia 09/07/2017  . Pressure injury of skin 08/18/2017  . AKI (acute kidney injury) ([place])   . Cardiogenic shock ([place])   . S/P CABG x 2 08/07/2017  . Thrombocytopenia ([place])   . NSTEMI (non-ST elevated myocardial infarction) ([place]) 07/31/2017  . BPH (benign prostatic hyperplasia) 04/03/2013  .  CAD (coronary artery disease) 04/01/2013  . Paroxysmal atrial fibrillation ([place]) 04/01/2013  . Hyperlipidemia   . Hypertensive heart disease   . Personal history of transient ischemic attack (TIA) and cerebral infarction without residual deficit   . Long-term (current) use of anticoagulants   . Chronic kidney disease stage 3     Past Surgical History:  Procedure Laterality Date  . CARDIAC CATHETERIZATION  07/21/2005  . CORONARY ARTERY BYPASS GRAFT N/A 08/07/2017   Procedure: CORONARY ARTERY BYPASS GRAFTING (CABG) x 2 WITH ENDOSCOPIC HARVESTING OF RIGHT SAPHENOUS VEIN.;  Surgeon: Ivin Poot, MD;  Location: [facility];  Service: Open Heart Surgery;  Laterality: N/A;  . HERNIA REPAIR  12/26/2002   BILATERAL INGUINAL HERNIA REPAIR  . LEFT HEART CATH AND CORONARY ANGIOGRAPHY N/A 08/03/2017   Procedure: LEFT HEART CATH AND CORONARY ANGIOGRAPHY;  Surgeon: Sherren Mocha, MD;  Location: [facility];  Service: Cardiovascular;  Laterality: N/A;  . MASTOID SURGERY X 5    . PROSTATE SURGERY    . TEE WITHOUT CARDIOVERSION N/A 08/07/2017   Procedure: TRANSESOPHAGEAL ECHOCARDIOGRAM (TEE);  Surgeon: Prescott Gum, Collier Salina, MD;  Location: [facility];  Service: Open Heart Surgery;  Laterality: N/A;  . TOTAL KNEE ARTHROPLASTY Left 03/28/2013   Procedure: LEFT TOTAL KNEE ARTHROPLASTY;  Surgeon: Gearlean Alf, MD;  Location: [facility];  Service: Orthopedics;  Laterality: Left;  Home Medications    Prior to Admission medications   Medication Sig Start Date End Date Taking? Authorizing Provider  acetaminophen (TYLENOL) 500 MG tablet Take 1,000 mg by mouth every 8 (eight) hours as needed for mild pain.    Yes [provider]  allopurinol (ZYLOPRIM) 300 MG tablet Take 0.5 tablets (150 mg total) by mouth daily. 08/28/17  Yes Lars Pinks M, PA-C  apixaban (ELIQUIS) 2.5 MG TABS tablet Take 2.5 mg 2 (two) times daily by mouth.   Yes [provider]  aspirin EC 81 MG EC tablet Take 1 tablet  (81 mg total) by mouth daily. 08/28/17  Yes Lars Pinks M, PA-C  Cholecalciferol (VITAMIN D3) 2000 units TABS Take 2,000 Units by mouth daily.   Yes [provider]  finasteride (PROSCAR) 5 MG tablet Take 5 mg by mouth daily.   Yes [provider]  metoprolol tartrate (LOPRESSOR) 25 MG tablet Take 25 mg by mouth 2 (two) times daily.    Yes [provider]  nitroGLYCERIN (NITROSTAT) 0.4 MG SL tablet Place 0.4 mg under the tongue every 5 (five) minutes as needed for chest pain.   Yes [provider]  omeprazole (PRILOSEC) 20 MG capsule Take 20 mg by mouth daily.    Yes [provider]  polyethylene glycol (MIRALAX / GLYCOLAX) packet Take 17 g by mouth daily as needed for moderate constipation.    Yes [provider]  rosuvastatin (CRESTOR) 10 MG tablet Take 1 tablet (10 mg total) by mouth daily. 08/28/17  Yes Tacy Dura, Donielle M, PA-C  senna-docusate (SENOKOT-S) 8.6-50 MG tablet Take 1 tablet by mouth daily.   Yes [provider]  sodium phosphate (FLEET) 7-19 GM/118ML ENEM Place 1 enema daily as needed rectally for severe constipation.   Yes [provider]    Family History Family History  Problem Relation Age of Onset  . Hypertension Father   . Hypertension Mother   . Hyperlipidemia Mother   . Cancer Brother     Social History Social History   Tobacco Use  . Smoking status: Former Research scientist (life sciences)  . Smokeless tobacco: Never Used  . Tobacco comment: QUIT SMOKING PRIOR TO 1980  Substance Use Topics  . Alcohol use: No  . Drug use: No     Allergies   Lipitor [atorvastatin]   Review of Systems Review of Systems  Constitutional: Positive for activity change. Negative for diaphoresis.  Respiratory: Positive for shortness of breath.   Cardiovascular: Positive for chest pain.  Gastrointestinal: Negative for nausea.  Hematological: Bruises/bleeds easily.  All other systems reviewed and are  negative.    Physical Exam Updated Vital Signs BP 109/64   Pulse 90   Resp 18   SpO2 95%   Physical Exam  Constitutional: He is oriented to person, place, and time. He appears well-developed and well-nourished.  HENT:  Head: Normocephalic and atraumatic.  Eyes: EOM are normal. Pupils are equal, round, and reactive to light.  Neck: Normal range of motion. Neck supple. No JVD present.  Cardiovascular: Normal rate, intact distal pulses and normal pulses.  Murmur heard.  Systolic murmur is present. Pulmonary/Chest: Effort normal and breath sounds normal. No respiratory distress. He has no wheezes.  Abdominal: Soft. Bowel sounds are normal. He exhibits no distension. There is no tenderness. There is no rebound and no guarding.  Neurological: He is alert and oriented to person, place, and time. No cranial nerve deficit. Coordination normal.  NIHSS - 0 No objective sensory deficits, Motor  strength upper and lower extremity 4+ and equal Normal cerebellar exam  Skin: Skin is warm and dry.  Vitals reviewed.    ED Treatments / Results  Labs (all labs ordered are listed, but only abnormal results are displayed) Labs Reviewed  BASIC METABOLIC PANEL - Abnormal; Notable for the following components:      Result Value   CO2 19 (*)    Glucose, Bld 107 (*)    BUN 35 (*)    Creatinine, Ser 2.32 (*)    GFR calc non Af Amer 24 (*)    GFR calc Af Amer 28 (*)    All other components within normal limits  CBC - Abnormal; Notable for the following components:   RBC 3.64 (*)    Hemoglobin 12.3 (*)    HCT 37.3 (*)    MCV 102.5 (*)    All other components within normal limits  TROPONIN I - Abnormal; Notable for the following components:   Troponin I 0.55 (*)    All other components within normal limits  PROTIME-INR - Abnormal; Notable for the following components:   Prothrombin Time 15.3 (*)    All other components within normal limits  CBG MONITORING, ED - Abnormal; Notable for the  following components:   Glucose-Capillary 103 (*)    All other components within normal limits  APTT  TROPONIN I    EKG  EKG Interpretation  Date/Time:  11-15-17 05:09:37 EST Ventricular Rate:  91 PR Interval:    QRS Duration: 108 QT Interval:  400 QTC Calculation: 493 R Axis:   75 Text Interpretation:  Sinus rhythm Atrial premature complex Nonspecific repol abnormality, lateral leads ST elevation in avR  with TWI in the lateral leads and inferior leads are new Confirmed by Varney Biles [phone]) on 2017/11/15 5:40:01 AM       EKG Interpretation  Date/Time:  2017-11-15 07:04:43 EST Ventricular Rate:  93 PR Interval:    QRS Duration: 111 QT Interval:  384 QTC Calculation: 478 R Axis:   106 Text Interpretation:  Sinus rhythm Right axis deviation Repol abnrm, severe global ischemia (LM/MVD) ST deprssion is more pronounced in the anteiror leads Confirmed by Varney Biles [phone]) on 15-Nov-2017 7:14:32 AM        Radiology Dg Chest Portable 1 View  Result Date: 11/15/17 CLINICAL DATA:  Chest pain today. EXAM: PORTABLE CHEST 1 VIEW COMPARISON:  Radiographs 09/30/2017 FINDINGS: Post median sternotomy. Increase cardiomegaly. Development of pleural effusion and hazy bibasilar opacities. Perihilar pulmonary edema, at least moderate in degree. Aortic arch atherosclerosis. No confluent airspace disease. Surgical clips in the right axilla. IMPRESSION: Moderate CHF. Electronically Signed   By: Jeb Levering M.D.   On: 11-15-2017 06:29    Procedures Procedures (including critical care time)  CRITICAL CARE Performed by: Laylani Pudwill   Total critical care time: 48 minutes  Critical care time was exclusive of separately billable procedures and treating other patients.  Critical care was necessary to treat or prevent imminent or life-threatening deterioration.  Critical care was time spent personally by me on the following activities:  development of treatment plan with patient and/or surrogate as well as nursing, discussions with consultants, evaluation of patient's response to treatment, examination of patient, obtaining history from patient or surrogate, ordering and performing treatments and interventions, ordering and review of laboratory studies, ordering and review of radiographic studies, pulse oximetry and re-evaluation of patient's condition.   Medications Ordered in ED Medications  nitroGLYCERIN (NITROGLYN)  2 % ointment 1 inch (1 inch Topical Given 11-19-2017 0707)  heparin ADULT infusion 100 units/mL (25000 units/276mL sodium chloride 0.45%) (850 Units/hr Intravenous New Bag/Given 2017/11/19 0720)  morphine 4 MG/ML injection 4 mg (4 mg Intravenous Given Nov 19, 2017 0707)  heparin injection 60 Units/kg (4,000 Units Intravenous Given 19-Nov-2017 0717)     Initial Impression / Assessment and Plan / ED Course  I have reviewed the triage vital signs and the nursing notes.  Pertinent labs & imaging results that were available during my care of the patient were reviewed by me and considered in my medical decision making (see chart for details).  Clinical Course as of Nov 14 731  2017-11-19  0709 Pt's trop returned slightly elevated. He started haviing chest pain again. Repeat EKG ordered. Cardiology called, they will see the patient immediately. Troponin I: (!!) 0.55 [AN]  [id number] Patient started on heparin.  Morphine given for pain along with Nitropaste.  Pain is now 1 or 2 out of 10.  Cardiology at bedside.  Given patient is on Eliquis, cardiac cath might be delayed.  Heparin started.  CODE STATUS discussed with the family.  The patient is DNR/DNI.  [AN]    Clinical Course User Index [AN] Varney Biles, MD   Patient with coronary artery disease status post recent CABG, A. fib, CKD comes in with chief complaint of chest pain.  Patient had multivessel disease, and had two-vessel bypass.  He still had obstructive  disease and other coronary vessels.  Concerns are for ischemic pain for the chest pain, especially given there is some ST depression diffusely with elevation in aVR -which are new.  Currently patient is chest pain-free with those EKG changes.  We will initiate ACS workup.  Anticipate that patient will need admission.  Final Clinical Impressions(s) / ED Diagnoses   Final diagnoses:  NSTEMI (non-ST elevated myocardial infarction) [facility])    ED Discharge Orders    None       Varney Biles, MD 19-Nov-2017 [phone]

## 2017-11-24 NOTE — ED Notes (Signed)
Pt moved from Trauma B to RM D 34 --report given to Stasia Cavalier, RN

## 2017-11-24 NOTE — ED Notes (Signed)
Dr. Kathrynn Humble ( EDP) notified on pt.'s 3/10 chest pain and elevated Troponin result .

## 2017-11-24 NOTE — ED Notes (Signed)
Report given to RN in 2heart-

## 2017-11-24 NOTE — Discharge Summary (Addendum)
Patient ID: Stanley Robles MRN: [id number] DOB/AGE: [birth year] 84 y.o.  Admit date: December 03, 2017 Discharge date: 03-Dec-2017  Primary Discharge Diagnosis: Non-ST elevation MI Secondary Discharge Diagnosis: Acute on chronic diastolic heart failure  Significant Diagnostic Studies: Left heart catheterization with bypass graft angiography  Consults: None  Hospital Course: The patient was admitted to the hospital after 12 hours of recurring chest discomfort and dyspnea.  Please see the admitting history and physical for details.   Upon acute evaluation as a suspected STEMI in the emergency room at around 7:30 AM, the patient's symptoms improved as did initial ischemic ST segment changes.  Creatinine was noted to be greater than 2.2.  Patient was noted to be DNI and DNR.  Long discussion then ensued with the patient and family.  The patient requested and was encouraged by his family to consider coronary angiography to determine if there were treatment options for the sudden development of acute coronary syndrome.  DNI DNR orders were rescinded and plans were made to perform coronary angiography later in the day if the patient remained stable.  IV fluids were administered to help protect kidney function from contrast injury.  The patient's last dose of Eliquis have been less than 12 hours prior to admission.  Prior to performing angiography, it was noted from previous cath report that the right femoral and right radial access sites were not viable options due to occlusion.  The left radial approach was chosen.  Angiography demonstrated a patent LIMA to the LAD but with an 80% distal anastomosis stenosis.  Native right coronary was totally occluded.  Left coronary demonstrated diffuse 50% left main, patent left anterior descending, and string-like heavily calcified ostial to proximal circumflex/obtuse marginal.  Angiography demonstrated retrograde filling/competitive flow of the saphenous vein graft to  the obtuse marginal.  Left ventricular end-diastolic pressure was recorded with the Judkins right catheter and was 31 mmHg.  Systolic blood pressure was 90 mmHg.  Fluoroscopy demonstrated no evidence of a saphenous vein graft markers on the aorta.  Review of the operative report by Dr. Prescott Gum and personal conversation with him confirmed that the saphenous vein graft to the obtuse marginal arose from the right subclavian artery.  Preparations was being made to use the left femoral approach for retrograde right subclavian access to visualize the SVG to the obtuse marginal.  At this point, the patient complained of increasing back discomfort, anxiety, and increased shortness of breath.  He requested that we stop the procedure.  Considering the unusual anatomy and inability of the patient to lie flat, the procedure was terminated.  The plan was to further treat the patient for heart failure and discuss whether or not to return when more stable.  He was transferred to the intensive care unit, 2H 13.  BiPAP was started, IV Lasix was given, and shortly thereafter he was noted to be unresponsive, no palpable pulse, despite monitor evidence of electrical activity with a heart rate in the 70 range.  CPR was started.  Please see the code note concerning the resuscitation efforts.  Approximately 20 minutes of CPR was administered.  There was inability to gain stable hemodynamics.  The family eventually felt that no further CPR attempts should be made.  The patient was pronounced dead at [facility] with the family present in the room.  Cause of death, acute diastolic heart failure, ischemically mediated with associated respiratory failure and PEA arrest.  Suspect occlusion of the saphenous vein graft to the obtuse marginal.  Discharge Exam: Blood pressure (!) 75/53, pulse (!) 118, temperature 98.2 F (36.8 C), temperature source Oral, resp. rate (!) 31, height 5\' 7"  (1.702 m), weight 167 lb 8.8 oz (76 kg), SpO2 92 %.  Labs:   Lab Results  Component Value Date   WBC 15.1 (H) 2017/11/25   HGB 13.0 11-25-2017   HCT 39.5 Nov 25, 2017   MCV 106.8 (H) 11/25/2017   PLT 193 2017-11-25    Recent Labs  Lab 11/25/17 0552  NA 139  K 4.3  CL 110  CO2 19*  BUN 35*  CREATININE 2.32*  CALCIUM 9.3  GLUCOSE 107*   Lab Results  Component Value Date   CKTOTAL 74 08/09/2011   CKMB 3.1 08/09/2011   TROPONINI 1.69 (HH) 25-Nov-2017    Lab Results  Component Value Date   CHOL 127 11-25-17   CHOL 162 02/28/2015   Lab Results  Component Value Date   HDL 30 (L) 11-25-2017   HDL 31 (L) 02/28/2015   Lab Results  Component Value Date   LDLCALC 77 2017/11/25   LDLCALC 102 (H) 02/28/2015   Lab Results  Component Value Date   TRIG 99 2017-11-25   TRIG 104 08/17/2017   TRIG 69 08/14/2017   Lab Results  Component Value Date   CHOLHDL 4.2 11/25/17   CHOLHDL 5.2 02/28/2015   No results found for: LDLDIRECT     Signed: Belva Crome III November 25, 2017, 7:13 PM

## 2017-11-24 NOTE — Progress Notes (Signed)
Responded to [place] x2 for this patient who has three daughters here visiting.  Daughters and their companions were placed in consult room awaiting update from Medical team.  Family was then taken back to be with their loved one at bedside.  Chaplain provided emotional and grief support to family and spiritual presence.  Prayers spoken softly for patient, medical team and family.  Thank you to the medical team for caring for this patient.  Chaplain available as needed for support.    November 30, 2017 1848  Clinical Encounter Type  Visited With Family;Health care provider  Visit Type Initial;Follow-up;Spiritual support;Code;Critical Care  Spiritual Encounters  Spiritual Needs Emotional;Grief support

## 2017-11-24 DEATH — deceased

## 2017-12-07 IMAGING — DX DG CHEST 1V PORT
1 series · 1 of 1 positions shown · non-contrast
Comparison: 08/14/2017

CLINICAL DATA: Status post coronary bypass grafting

EXAM:
PORTABLE CHEST 1 VIEW

[chest ap]
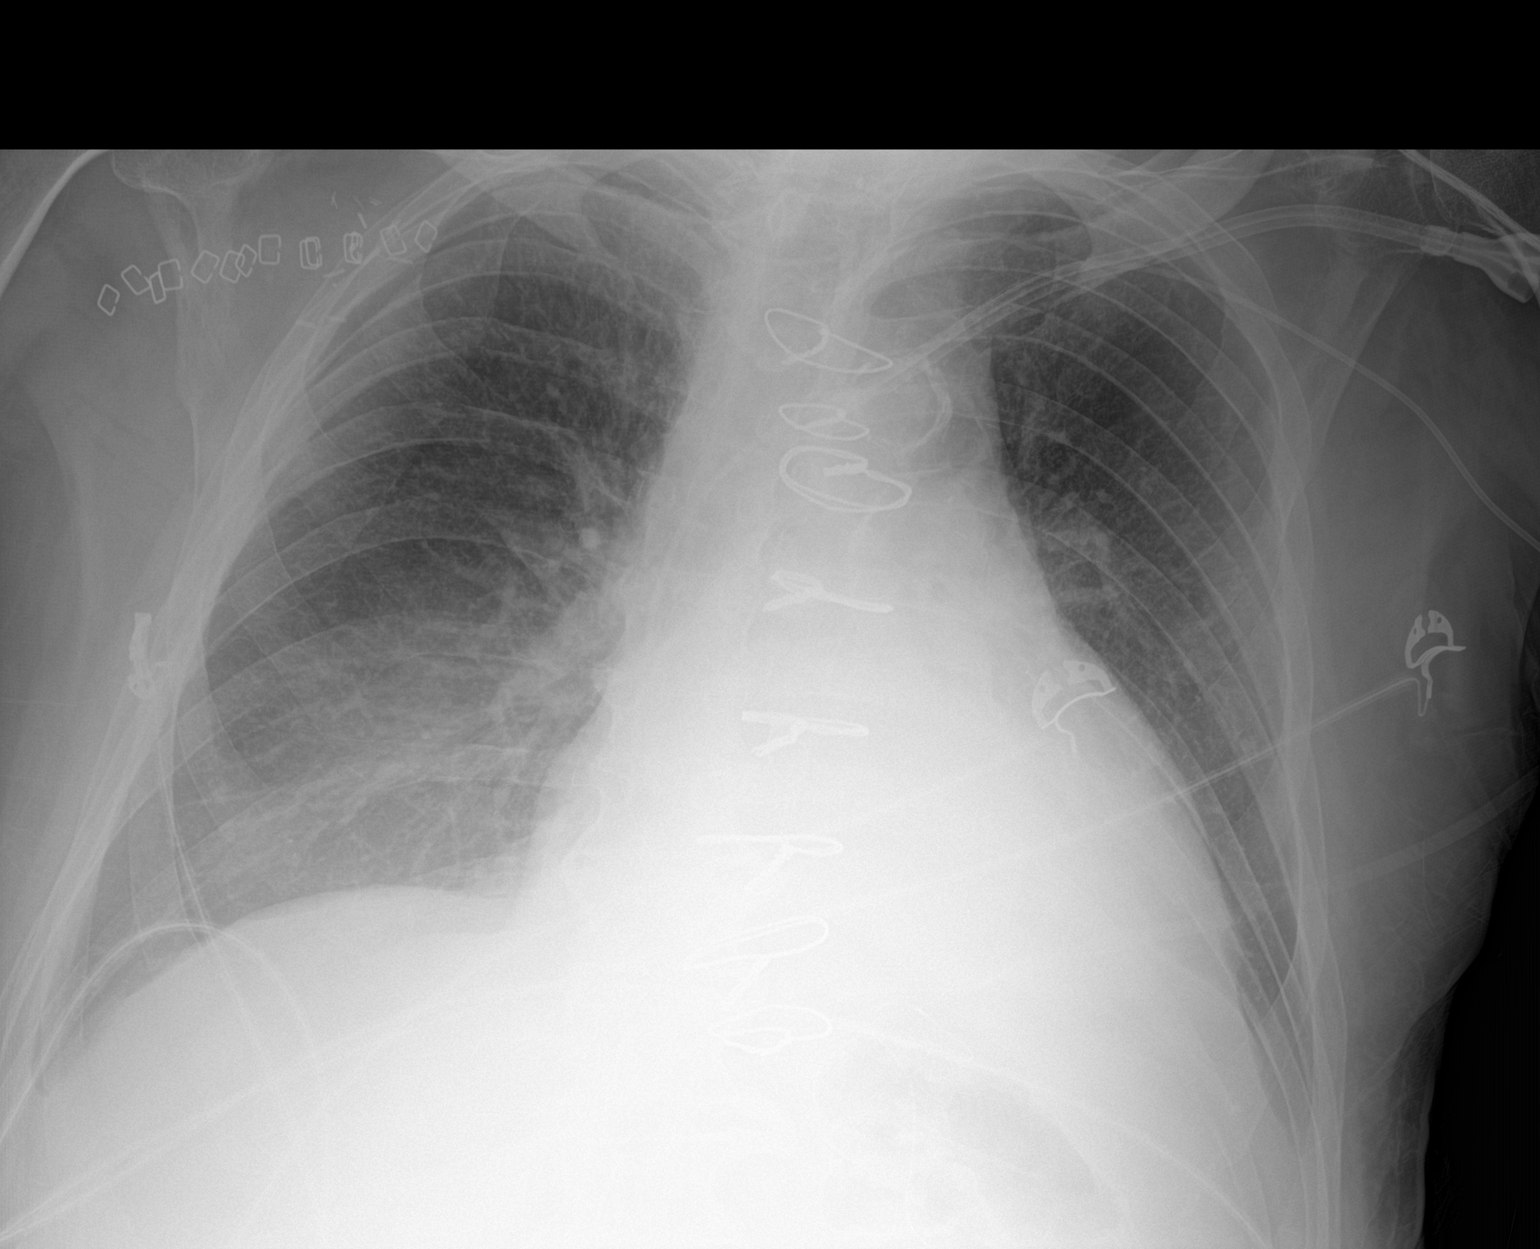

[1 of 1 positions shown; findings below may reference images not displayed]

FINDINGS: Cardiac shadow remains enlarged. Postsurgical changes are again
seen. Aortic calcifications are noted. PICC line is noted in the
superior aspect of the right atrium stable from the prior exam. The
right lung is clear improved from the prior exam. Stable left
retrocardiac atelectasis is noted. No pneumothorax is seen. Left
subclavian central line is noted as well.
IMPRESSION: Stable left retrocardiac atelectasis.

## 2017-12-15 ENCOUNTER — Telehealth: Payer: Self-pay

## 2017-12-15 ENCOUNTER — Telehealth: Payer: Self-pay | Admitting: Interventional Cardiology

## 2017-12-15 NOTE — Telephone Encounter (Signed)
D/C received via mail from [facility] [phone]) for Dr.Smith to sign. After speaking with Anderson Malta this patient is only a hospital patient and she is not so sure Dr.Smith will sign patient has never been seen here in our office.    I called [facility] left message with receptionist to have someone call  me back so I can discuss this with whoever handles the d/c there. Also made her aware Dr.Smith is not back in the office until Monday January 28th.. She stated she will have someone call me back.

## 2017-12-15 NOTE — Telephone Encounter (Signed)
Called Tonya back w/ [facility] made her aware that jennifer text Dr.Smith to see if her will sign d/c we received on this patient. Once we hear back from [facility] I will contact [place] and let her know if d/c will get signed.

## 2017-12-15 NOTE — Telephone Encounter (Signed)
Stanley Robles with [facility] aware Dr.Smith will sign d/c. And that he is not back in office until Monday Jan 28th.

## 2017-12-24 ENCOUNTER — Telehealth: Payer: Self-pay | Admitting: Interventional Cardiology

## 2017-12-24 NOTE — Telephone Encounter (Signed)
Original D/C signed by Dr.Smith. It has been mailed to  [place], [id number]

## 2018-01-02 IMAGING — RF DG SWALLOWING FUNCTION
1 series · 1 of 1 positions shown · non-contrast
Comparison: 08/05/2017

CLINICAL DATA: Dysphagia since open heart surgery in July 2017, history of prior stroke, coronary disease post MI and CABG,
hypertension, atrial fibrillation

EXAM:
MODIFIED BARIUM SWALLOW
TECHNIQUE: Different consistencies of barium were administered orally to the
patient by the Speech Pathologist. Imaging of the pharynx was
performed in the lateral projection.
FLUOROSCOPY TIME:  Fluoroscopy Time:  3 minutes 18 seconds
Radiation Exposure Index (if provided by the fluoroscopic device):
34.6 mGy
Number of Acquired Spot Images: multiple fluoroscopic screen
captures

[Series 1: cp_standard · 0.25mm/px · 1 of 1 slices shown]
[im 1/1]
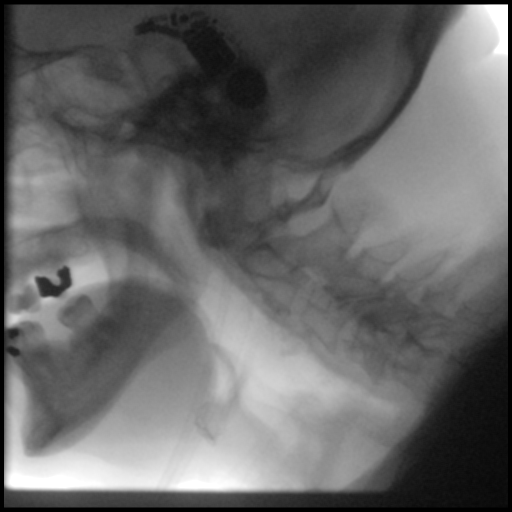

[1 of 1 positions shown; findings below may reference images not displayed]

FINDINGS: Thin liquid- with sips by teaspoon, premature spillover to the
vallecular and piriform sinuses is seen. Delayed initiation of
swallow. No laryngeal penetration or aspiration. With self cup
presentation, premature spillover and delayed initiation RIGHT again
identified associated with laryngeal penetration and aspiration of
contrast into the proximal trachea. A weak spontaneous cough reflex
was seen. With teaspoon presentation and prolonged holding, no
laryngeal penetration or aspiration occurred. Minimal vallecular
residual noted. Laryngeal penetration and aspiration were not
identified on the prior exam.

Nectar thick liquid- premature spillover by teaspoon and cup to
vallecular. Delayed initiation. Laryngeal penetration occurred
following cup presentation without gross aspiration

Honey- not evaluated

Moatshe?Ciapi with applesauce consistency, slightly delayed initiation of
swallow. No laryngeal penetration or aspiration

Cracker-delayed initiation without laryngeal penetration or
aspiration

Moatshe?Aseri with cracker- not evaluated

Barium tablet -  within normal limits
IMPRESSION: Swallowing dysfunction as above.

Please refer to the Speech Pathologists report for complete details
and recommendations.

## 2018-01-22 IMAGING — DX DG CHEST 2V
2 series · 2 of 2 positions shown · non-contrast
Comparison: Chest x-ray of August 26, 2017

CLINICAL DATA: Status post CABG on August 07, 2017. No current
complaints.

EXAM:
CHEST  2 VIEW

[dg chest 2 view (1 of 2)]
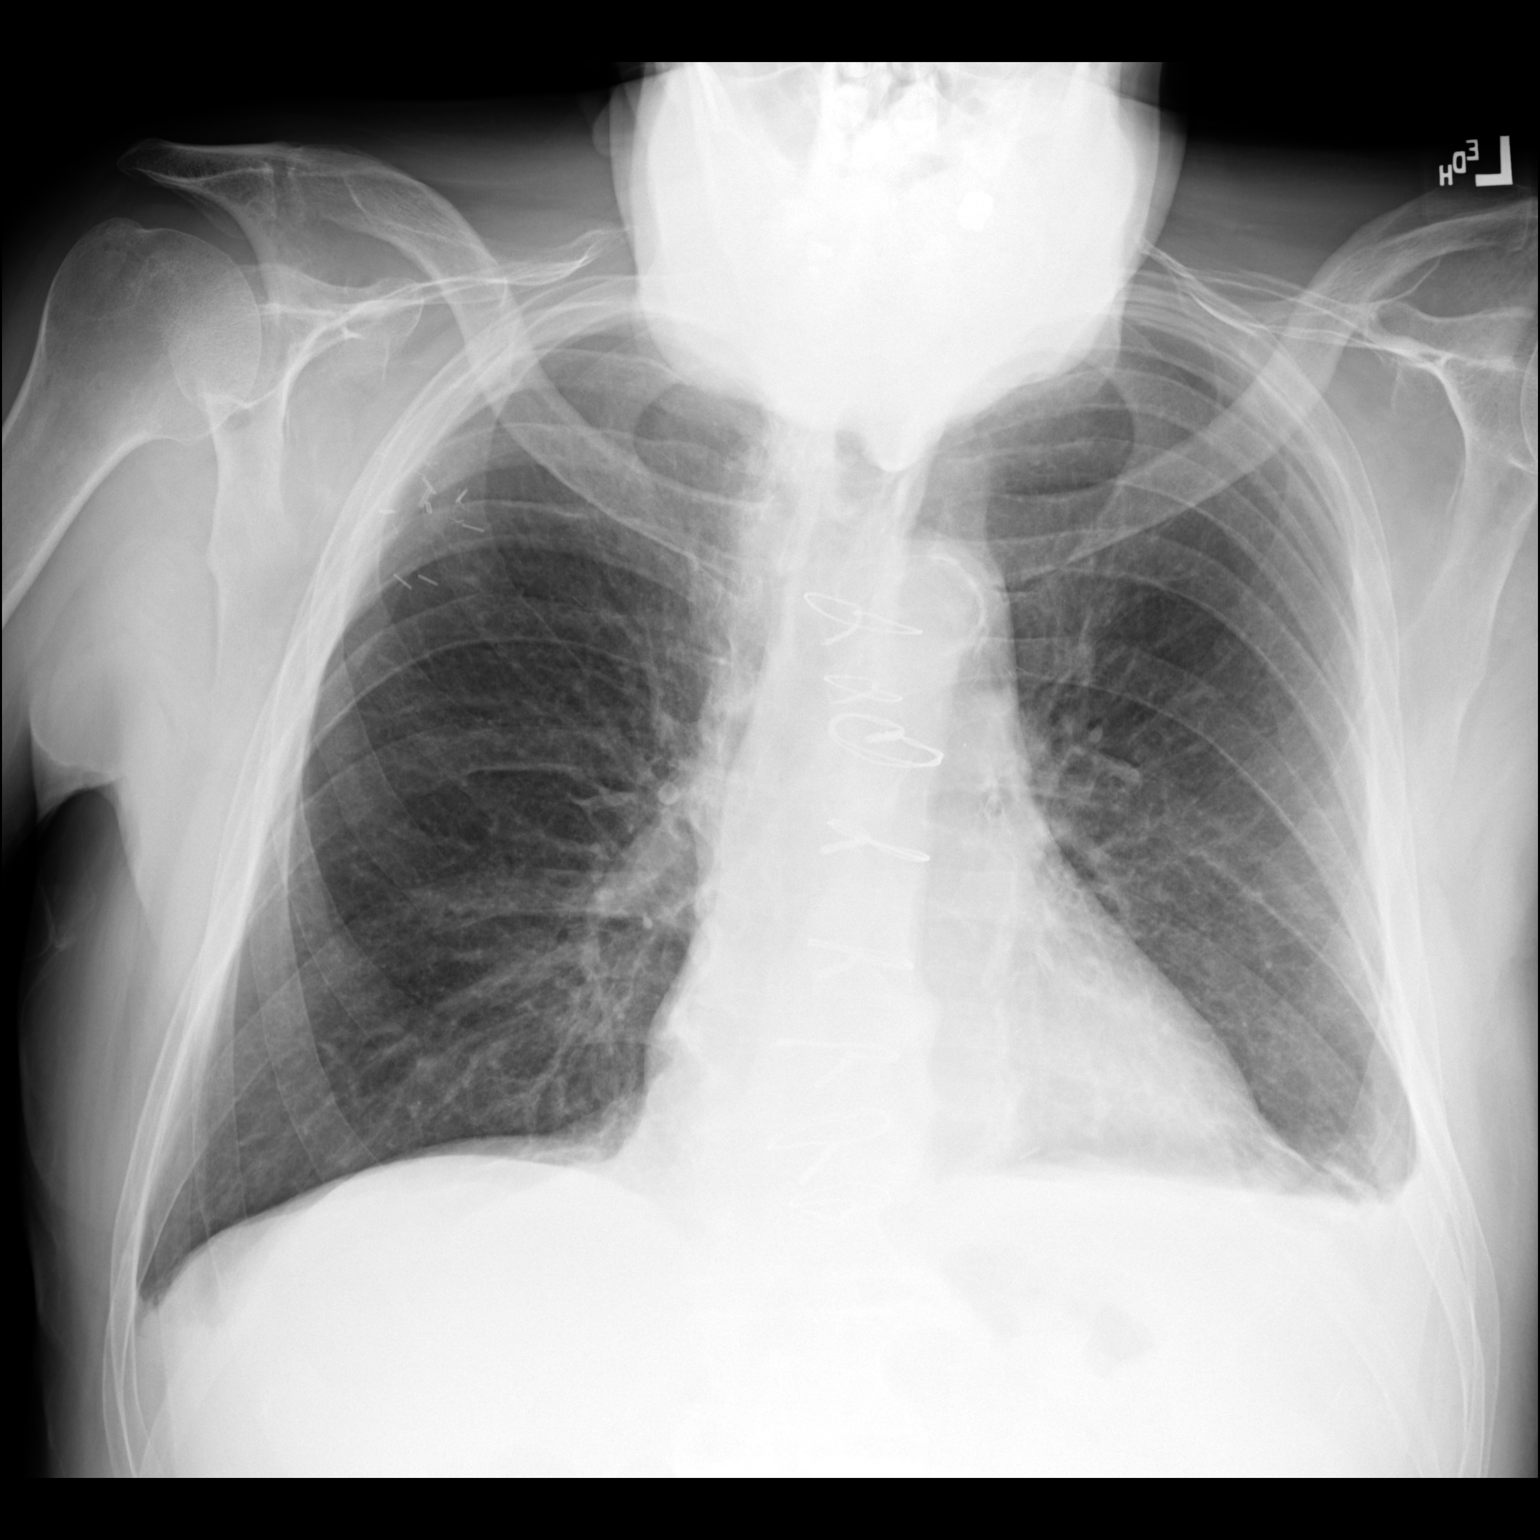

[dg chest 2 view (2 of 2)]
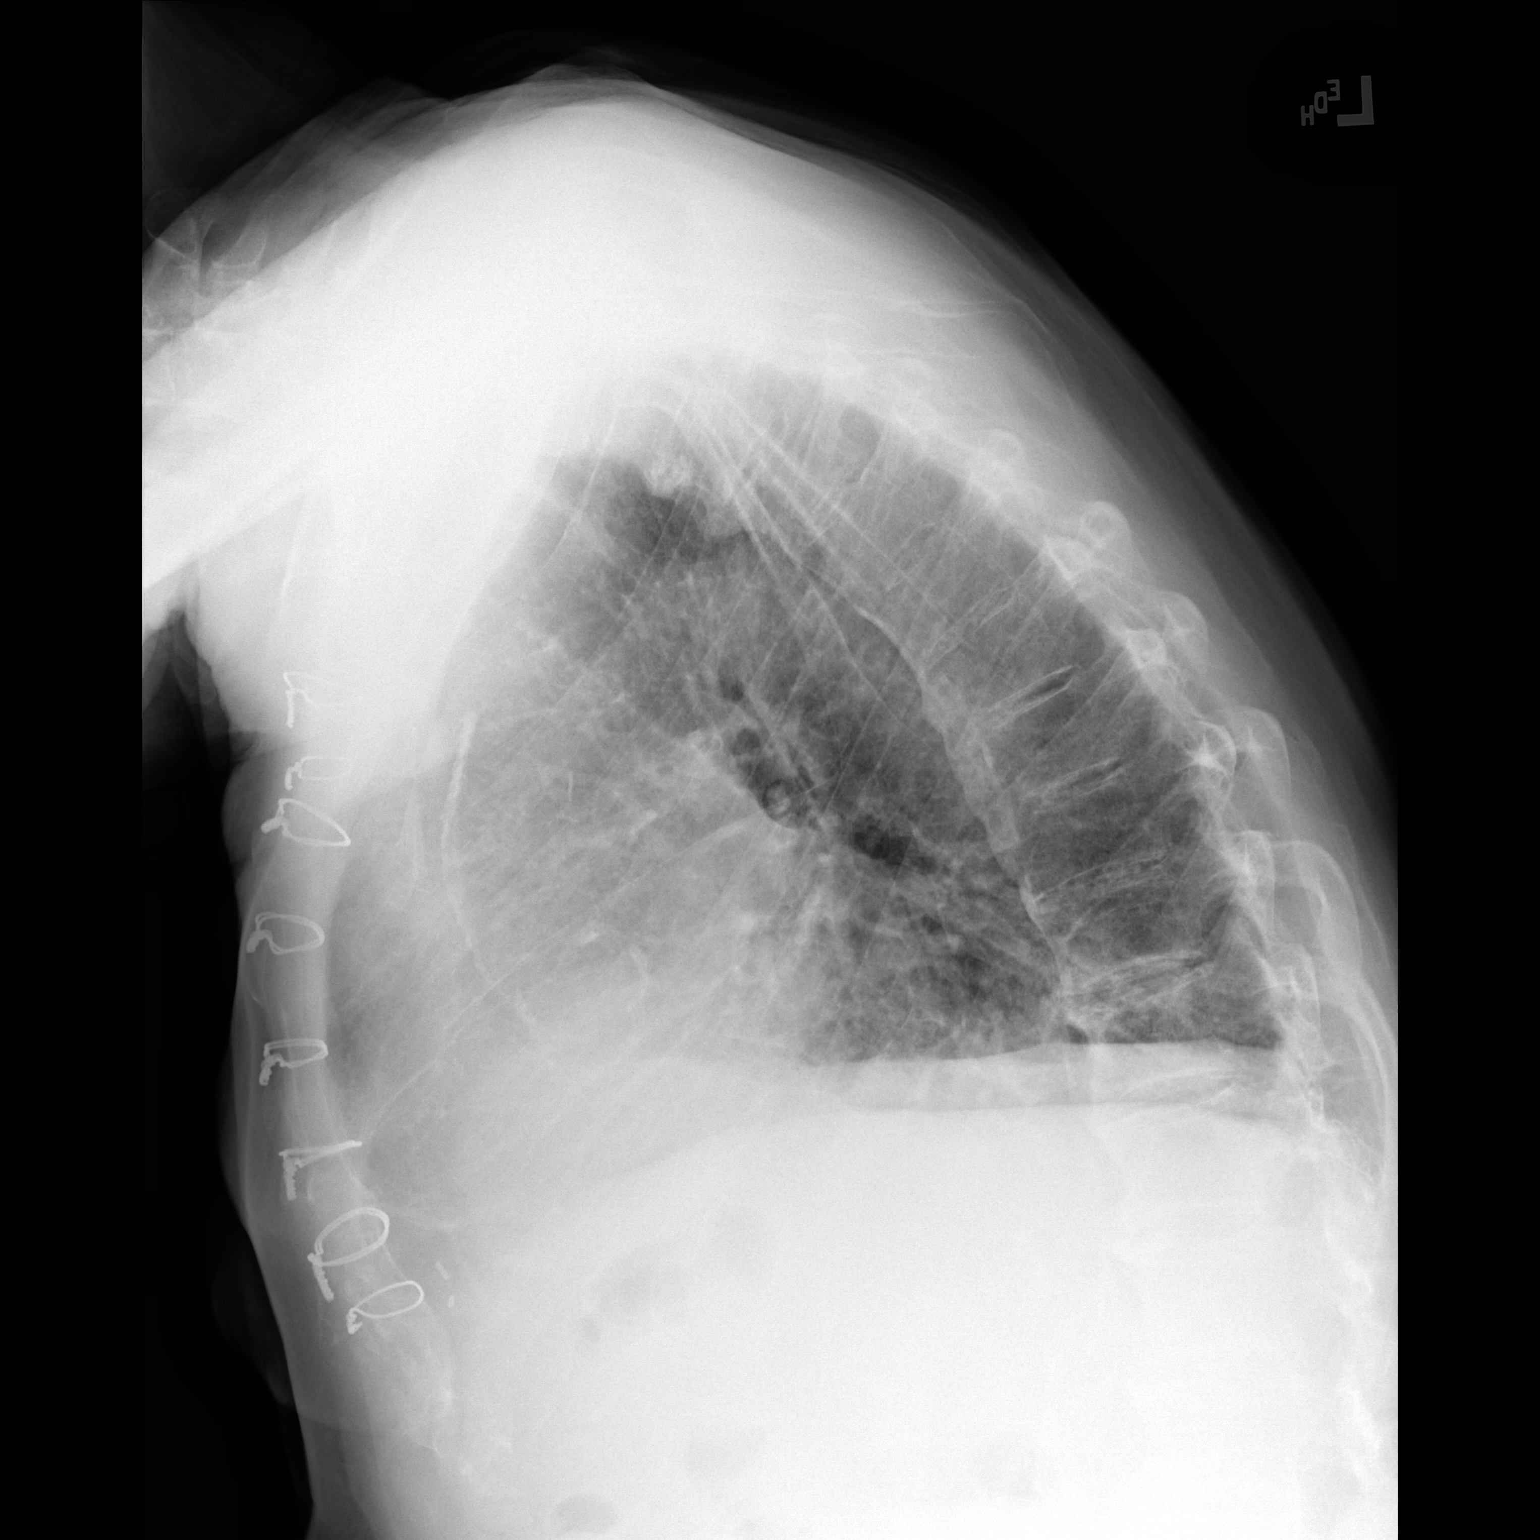

[2 of 2 positions shown; findings below may reference images not displayed]

FINDINGS: The right lung is well-expanded and clear. On the left there has
been clearing of the retrocardiac atelectasis with only minimal
residual density noted. There is a trace of blunting of the left
lateral costophrenic angle that process. The heart and there is
calcification in the wall of the aortic arch. The sternal wires are
intact. The retrosternal soft tissues appear normal. There is mild
multilevel degenerative disc disease of the thoracic spine.
Pulmonary vascularity are normal.
IMPRESSION: Further interval clearing of retrocardiac atelectasis on the left.
Persistent trace left pleural effusion. No pulmonary edema.
